# Patient Record
Sex: Female | Born: 1955
Health system: Southern US, Community
[De-identification: ages and names within clinical notes are randomized; demographics above are authoritative.]

## PROBLEM LIST (undated history)

## (undated) DIAGNOSIS — M329 Systemic lupus erythematosus, unspecified: Secondary | ICD-10-CM

## (undated) DIAGNOSIS — D689 Coagulation defect, unspecified: Secondary | ICD-10-CM

## (undated) DIAGNOSIS — K219 Gastro-esophageal reflux disease without esophagitis: Secondary | ICD-10-CM

## (undated) DIAGNOSIS — I82409 Acute embolism and thrombosis of unspecified deep veins of unspecified lower extremity: Secondary | ICD-10-CM

## (undated) DIAGNOSIS — F32A Depression, unspecified: Secondary | ICD-10-CM

## (undated) DIAGNOSIS — IMO0002 Reserved for concepts with insufficient information to code with codable children: Secondary | ICD-10-CM

## (undated) DIAGNOSIS — K859 Acute pancreatitis without necrosis or infection, unspecified: Secondary | ICD-10-CM

## (undated) DIAGNOSIS — E039 Hypothyroidism, unspecified: Secondary | ICD-10-CM

## (undated) DIAGNOSIS — R011 Cardiac murmur, unspecified: Secondary | ICD-10-CM

## (undated) DIAGNOSIS — B009 Herpesviral infection, unspecified: Secondary | ICD-10-CM

## (undated) DIAGNOSIS — F419 Anxiety disorder, unspecified: Secondary | ICD-10-CM

## (undated) DIAGNOSIS — F329 Major depressive disorder, single episode, unspecified: Secondary | ICD-10-CM

## (undated) DIAGNOSIS — E785 Hyperlipidemia, unspecified: Secondary | ICD-10-CM

## (undated) DIAGNOSIS — J189 Pneumonia, unspecified organism: Secondary | ICD-10-CM

## (undated) DIAGNOSIS — I10 Essential (primary) hypertension: Secondary | ICD-10-CM

## (undated) DIAGNOSIS — M797 Fibromyalgia: Secondary | ICD-10-CM

## (undated) DIAGNOSIS — J449 Chronic obstructive pulmonary disease, unspecified: Secondary | ICD-10-CM

## (undated) DIAGNOSIS — K449 Diaphragmatic hernia without obstruction or gangrene: Secondary | ICD-10-CM

## (undated) DIAGNOSIS — G709 Myoneural disorder, unspecified: Secondary | ICD-10-CM

## (undated) HISTORY — DX: Anxiety disorder, unspecified: F41.9

## (undated) HISTORY — DX: Pneumonia, unspecified organism: J18.9

## (undated) HISTORY — PX: SEPTOPLASTY: SUR1290

## (undated) HISTORY — DX: Hyperlipidemia, unspecified: E78.5

## (undated) HISTORY — DX: Herpesviral infection, unspecified: B00.9

## (undated) HISTORY — DX: Systemic lupus erythematosus, unspecified: M32.9

## (undated) HISTORY — DX: Fibromyalgia: M79.7

## (undated) HISTORY — DX: Diaphragmatic hernia without obstruction or gangrene: K44.9

## (undated) HISTORY — PX: COLONOSCOPY: SHX174

## (undated) HISTORY — PX: OTHER SURGICAL HISTORY: SHX169

## (undated) HISTORY — DX: Hypothyroidism, unspecified: E03.9

## (undated) HISTORY — DX: Reserved for concepts with insufficient information to code with codable children: IMO0002

## (undated) HISTORY — PX: ABDOMINAL HYSTERECTOMY: SHX81

## (undated) HISTORY — DX: Myoneural disorder, unspecified: G70.9

## (undated) HISTORY — DX: Major depressive disorder, single episode, unspecified: F32.9

## (undated) HISTORY — DX: Acute embolism and thrombosis of unspecified deep veins of unspecified lower extremity: I82.409

## (undated) HISTORY — DX: Coagulation defect, unspecified: D68.9

## (undated) HISTORY — DX: Essential (primary) hypertension: I10

## (undated) HISTORY — PX: CHOLECYSTECTOMY: SHX55

## (undated) HISTORY — DX: Cardiac murmur, unspecified: R01.1

## (undated) HISTORY — DX: Gastro-esophageal reflux disease without esophagitis: K21.9

## (undated) HISTORY — PX: APPENDECTOMY: SHX54

## (undated) HISTORY — DX: Chronic obstructive pulmonary disease, unspecified: J44.9

## (undated) HISTORY — DX: Depression, unspecified: F32.A

---

## 2005-09-18 LAB — HM MAMMOGRAPHY

## 2007-09-19 LAB — HM COLONOSCOPY

## 2008-05-06 ENCOUNTER — Encounter: Payer: Self-pay | Admitting: Internal Medicine

## 2008-05-26 ENCOUNTER — Encounter: Payer: Self-pay | Admitting: Internal Medicine

## 2008-06-08 ENCOUNTER — Encounter: Payer: Self-pay | Admitting: Internal Medicine

## 2008-06-09 ENCOUNTER — Encounter: Payer: Self-pay | Admitting: Internal Medicine

## 2008-07-09 ENCOUNTER — Ambulatory Visit: Payer: Self-pay | Admitting: Internal Medicine

## 2008-07-17 ENCOUNTER — Ambulatory Visit: Payer: Self-pay | Admitting: Internal Medicine

## 2008-07-18 DIAGNOSIS — E039 Hypothyroidism, unspecified: Secondary | ICD-10-CM | POA: Insufficient documentation

## 2008-07-18 DIAGNOSIS — I10 Essential (primary) hypertension: Secondary | ICD-10-CM

## 2008-07-18 DIAGNOSIS — J45909 Unspecified asthma, uncomplicated: Secondary | ICD-10-CM | POA: Insufficient documentation

## 2008-07-18 DIAGNOSIS — E785 Hyperlipidemia, unspecified: Secondary | ICD-10-CM

## 2008-07-21 DIAGNOSIS — R0602 Shortness of breath: Secondary | ICD-10-CM | POA: Insufficient documentation

## 2008-08-25 ENCOUNTER — Ambulatory Visit: Payer: Self-pay | Admitting: Internal Medicine

## 2008-08-25 ENCOUNTER — Ambulatory Visit (HOSPITAL_BASED_OUTPATIENT_CLINIC_OR_DEPARTMENT_OTHER): Admission: RE | Admit: 2008-08-25 | Discharge: 2008-08-25 | Payer: Self-pay | Admitting: Internal Medicine

## 2008-08-25 DIAGNOSIS — IMO0001 Reserved for inherently not codable concepts without codable children: Secondary | ICD-10-CM

## 2008-08-29 ENCOUNTER — Ambulatory Visit: Payer: Self-pay | Admitting: Internal Medicine

## 2008-10-09 ENCOUNTER — Encounter: Payer: Self-pay | Admitting: Family Medicine

## 2008-10-29 ENCOUNTER — Ambulatory Visit: Payer: Self-pay | Admitting: Internal Medicine

## 2008-11-07 ENCOUNTER — Emergency Department (HOSPITAL_COMMUNITY): Admission: EM | Admit: 2008-11-07 | Discharge: 2008-11-07 | Payer: Self-pay | Admitting: Emergency Medicine

## 2008-11-14 DIAGNOSIS — G4730 Sleep apnea, unspecified: Secondary | ICD-10-CM

## 2008-11-17 ENCOUNTER — Ambulatory Visit: Payer: Self-pay | Admitting: Internal Medicine

## 2008-11-17 ENCOUNTER — Inpatient Hospital Stay (HOSPITAL_COMMUNITY): Admission: EM | Admit: 2008-11-17 | Discharge: 2008-11-19 | Payer: Self-pay | Admitting: Emergency Medicine

## 2009-01-04 ENCOUNTER — Ambulatory Visit: Payer: Self-pay | Admitting: Internal Medicine

## 2009-03-03 ENCOUNTER — Ambulatory Visit: Payer: Self-pay | Admitting: Family Medicine

## 2009-03-03 DIAGNOSIS — E1165 Type 2 diabetes mellitus with hyperglycemia: Secondary | ICD-10-CM

## 2009-03-10 ENCOUNTER — Telehealth: Payer: Self-pay | Admitting: Family Medicine

## 2009-05-03 ENCOUNTER — Telehealth: Payer: Self-pay | Admitting: Family Medicine

## 2009-05-26 ENCOUNTER — Telehealth: Payer: Self-pay | Admitting: Family Medicine

## 2009-05-26 ENCOUNTER — Ambulatory Visit: Payer: Self-pay | Admitting: Family Medicine

## 2009-06-22 ENCOUNTER — Telehealth: Payer: Self-pay | Admitting: Family Medicine

## 2009-07-01 ENCOUNTER — Telehealth: Payer: Self-pay | Admitting: Family Medicine

## 2009-07-28 ENCOUNTER — Telehealth: Payer: Self-pay | Admitting: Family Medicine

## 2009-08-03 ENCOUNTER — Ambulatory Visit: Payer: Self-pay | Admitting: Family Medicine

## 2009-08-03 DIAGNOSIS — L659 Nonscarring hair loss, unspecified: Secondary | ICD-10-CM | POA: Insufficient documentation

## 2009-08-04 LAB — CONVERTED CEMR LAB
Alkaline Phosphatase: 80 units/L (ref 39–117)
BUN: 11 mg/dL (ref 6–23)
Chloride: 101 meq/L (ref 96–112)
Cholesterol: 205 mg/dL — ABNORMAL HIGH (ref 0–200)
Creatinine, Ser: 0.8 mg/dL (ref 0.4–1.2)
Glucose, Bld: 102 mg/dL — ABNORMAL HIGH (ref 70–99)
HDL: 39.1 mg/dL (ref >39.00)
Hgb A1c MFr Bld: 6 % (ref 4.6–6.5)
Potassium: 3.9 meq/L (ref 3.5–5.1)
Sodium: 138 meq/L (ref 135–145)
TSH: 0.07 microintl units/mL — ABNORMAL LOW (ref 0.35–5.50)
Total CHOL/HDL Ratio: 5
Total Protein: 7.3 g/dL (ref 6.0–8.3)

## 2009-08-18 ENCOUNTER — Emergency Department (HOSPITAL_COMMUNITY): Admission: EM | Admit: 2009-08-18 | Discharge: 2009-08-18 | Payer: Self-pay | Admitting: Emergency Medicine

## 2009-08-30 ENCOUNTER — Telehealth: Payer: Self-pay | Admitting: Family Medicine

## 2009-09-01 ENCOUNTER — Ambulatory Visit: Payer: Self-pay | Admitting: Family Medicine

## 2009-09-03 ENCOUNTER — Telehealth: Payer: Self-pay | Admitting: Family Medicine

## 2009-09-15 ENCOUNTER — Ambulatory Visit: Payer: Self-pay | Admitting: Family Medicine

## 2009-09-15 ENCOUNTER — Encounter: Payer: Self-pay | Admitting: Internal Medicine

## 2009-09-23 ENCOUNTER — Encounter (INDEPENDENT_AMBULATORY_CARE_PROVIDER_SITE_OTHER): Payer: Self-pay | Admitting: Internal Medicine

## 2009-09-23 ENCOUNTER — Observation Stay (HOSPITAL_COMMUNITY): Admission: EM | Admit: 2009-09-23 | Discharge: 2009-09-25 | Payer: Self-pay | Admitting: Internal Medicine

## 2009-09-23 ENCOUNTER — Ambulatory Visit: Payer: Self-pay | Admitting: Internal Medicine

## 2009-09-27 ENCOUNTER — Telehealth: Payer: Self-pay | Admitting: Family Medicine

## 2009-09-29 ENCOUNTER — Ambulatory Visit: Payer: Self-pay | Admitting: Family Medicine

## 2009-09-30 ENCOUNTER — Telehealth: Payer: Self-pay | Admitting: Family Medicine

## 2009-10-01 ENCOUNTER — Inpatient Hospital Stay (HOSPITAL_COMMUNITY): Admission: EM | Admit: 2009-10-01 | Discharge: 2009-10-06 | Payer: Self-pay | Admitting: Emergency Medicine

## 2009-10-07 ENCOUNTER — Telehealth (INDEPENDENT_AMBULATORY_CARE_PROVIDER_SITE_OTHER): Payer: Self-pay | Admitting: *Deleted

## 2009-10-12 ENCOUNTER — Telehealth: Payer: Self-pay | Admitting: Internal Medicine

## 2009-10-14 ENCOUNTER — Ambulatory Visit: Payer: Self-pay | Admitting: Family Medicine

## 2009-10-14 DIAGNOSIS — J129 Viral pneumonia, unspecified: Secondary | ICD-10-CM

## 2009-10-15 ENCOUNTER — Ambulatory Visit: Payer: Self-pay | Admitting: Internal Medicine

## 2009-10-18 ENCOUNTER — Ambulatory Visit: Payer: Self-pay | Admitting: Family Medicine

## 2009-11-15 ENCOUNTER — Ambulatory Visit: Payer: Self-pay | Admitting: Family Medicine

## 2009-12-01 ENCOUNTER — Telehealth: Payer: Self-pay | Admitting: Family Medicine

## 2009-12-09 ENCOUNTER — Ambulatory Visit: Payer: Self-pay | Admitting: Family Medicine

## 2009-12-15 ENCOUNTER — Encounter: Payer: Self-pay | Admitting: Family Medicine

## 2009-12-24 ENCOUNTER — Telehealth: Payer: Self-pay | Admitting: Family Medicine

## 2009-12-31 ENCOUNTER — Telehealth: Payer: Self-pay | Admitting: Family Medicine

## 2010-01-03 ENCOUNTER — Ambulatory Visit: Payer: Self-pay | Admitting: Family Medicine

## 2010-01-03 DIAGNOSIS — I82409 Acute embolism and thrombosis of unspecified deep veins of unspecified lower extremity: Secondary | ICD-10-CM | POA: Insufficient documentation

## 2010-01-12 ENCOUNTER — Encounter: Payer: Self-pay | Admitting: Family Medicine

## 2010-01-27 ENCOUNTER — Telehealth: Payer: Self-pay | Admitting: Family Medicine

## 2010-01-27 ENCOUNTER — Telehealth (INDEPENDENT_AMBULATORY_CARE_PROVIDER_SITE_OTHER): Payer: Self-pay | Admitting: *Deleted

## 2010-02-01 ENCOUNTER — Telehealth: Payer: Self-pay | Admitting: Family Medicine

## 2010-02-02 ENCOUNTER — Encounter: Payer: Self-pay | Admitting: Family Medicine

## 2010-02-02 ENCOUNTER — Telehealth: Payer: Self-pay | Admitting: Family Medicine

## 2010-03-24 ENCOUNTER — Ambulatory Visit: Payer: Self-pay | Admitting: Family Medicine

## 2010-03-24 DIAGNOSIS — M545 Low back pain: Secondary | ICD-10-CM

## 2010-03-24 LAB — CONVERTED CEMR LAB
Blood in Urine, dipstick: NEGATIVE
Glucose, Urine, Semiquant: NEGATIVE
Nitrite: NEGATIVE
Specific Gravity, Urine: 1.02
Urobilinogen, UA: 0.2
WBC Urine, dipstick: NEGATIVE
pH: 5

## 2010-04-08 ENCOUNTER — Telehealth: Payer: Self-pay | Admitting: Family Medicine

## 2010-05-09 ENCOUNTER — Ambulatory Visit: Payer: Self-pay | Admitting: Family Medicine

## 2010-05-09 DIAGNOSIS — M25579 Pain in unspecified ankle and joints of unspecified foot: Secondary | ICD-10-CM

## 2010-05-09 DIAGNOSIS — IMO0002 Reserved for concepts with insufficient information to code with codable children: Secondary | ICD-10-CM

## 2010-05-10 ENCOUNTER — Telehealth: Payer: Self-pay | Admitting: Family Medicine

## 2010-05-24 ENCOUNTER — Ambulatory Visit: Payer: Self-pay | Admitting: Family Medicine

## 2010-05-24 ENCOUNTER — Telehealth: Payer: Self-pay | Admitting: Family Medicine

## 2010-05-24 DIAGNOSIS — R609 Edema, unspecified: Secondary | ICD-10-CM

## 2010-05-25 LAB — CONVERTED CEMR LAB
BUN: 19 mg/dL (ref 6–23)
CO2: 31 meq/L (ref 19–32)
Creatinine, Ser: 0.8 mg/dL (ref 0.4–1.2)
Potassium: 4.1 meq/L (ref 3.5–5.1)

## 2010-05-31 ENCOUNTER — Telehealth: Payer: Self-pay | Admitting: Family Medicine

## 2010-07-23 ENCOUNTER — Encounter: Payer: Self-pay | Admitting: Family Medicine

## 2010-07-28 ENCOUNTER — Telehealth: Payer: Self-pay | Admitting: Family Medicine

## 2010-07-28 ENCOUNTER — Ambulatory Visit: Payer: Self-pay | Admitting: Family Medicine

## 2010-07-28 DIAGNOSIS — R509 Fever, unspecified: Secondary | ICD-10-CM

## 2010-09-02 ENCOUNTER — Telehealth: Payer: Self-pay | Admitting: Family Medicine

## 2010-09-24 ENCOUNTER — Emergency Department (HOSPITAL_COMMUNITY)
Admission: EM | Admit: 2010-09-24 | Discharge: 2010-09-24 | Payer: Self-pay | Source: Home / Self Care | Admitting: Emergency Medicine

## 2010-09-26 ENCOUNTER — Telehealth: Payer: Self-pay | Admitting: Family Medicine

## 2010-09-28 ENCOUNTER — Other Ambulatory Visit: Payer: Self-pay | Admitting: Family Medicine

## 2010-09-28 ENCOUNTER — Ambulatory Visit
Admission: RE | Admit: 2010-09-28 | Discharge: 2010-09-28 | Payer: Self-pay | Source: Home / Self Care | Attending: Family Medicine | Admitting: Family Medicine

## 2010-09-28 DIAGNOSIS — M25529 Pain in unspecified elbow: Secondary | ICD-10-CM | POA: Insufficient documentation

## 2010-09-28 LAB — HM DIABETES EYE EXAM: HM Diabetic Eye Exam: NORMAL

## 2010-09-28 LAB — TSH: TSH: 0.16 u[IU]/mL — ABNORMAL LOW (ref 0.35–5.50)

## 2010-10-03 LAB — GLUCOSE, CAPILLARY: Glucose-Capillary: 203 mg/dL — ABNORMAL HIGH (ref 70–99)

## 2010-10-09 ENCOUNTER — Encounter: Payer: Self-pay | Admitting: Cardiology

## 2010-10-09 ENCOUNTER — Encounter: Payer: Self-pay | Admitting: Internal Medicine
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
## 2010-10-18 NOTE — Progress Notes (Signed)
Summary: Call-A-Nurse Report  [facility]-Nurse Triage Call Report Triage Record Num: [id number] Operator: Alphonsa Overall Patient Name: Yesenia Wilson Call Date & Time: 05/21/2010 7:43:13PM Patient Phone: [phone] PCP: Evelena Peat Patient Gender: Female PCP Fax : [phone] Patient DOB: 08-Jul-1956 [facility] Name: [facility] Reason for Call: Kathie Rhodes calling about ankle swelling. Onset 05/19/10. Pain and swelling in ankles,hands. OV 2 weeks ago. New job for pt x 3 weeks.Swelling resolves at night and worsens throughout day. Pt hears popping sounds in ankles occasionally, feels ankles may not 'hold her up". Home care advice given. Matalyn will call office 05/24/10 for assessment. Protocol(s) Used: Ankle Non-Injury Recommended Outcome per Protocol: See Provider within 2 Weeks Reason for Outcome: Joint stiff, pops, grates, catches, snaps or occasionally gives out with use AND not previously evaluated Care Advice:  ~ Call provider if symptoms worsen or new symptoms develop. Exercise to maintain good joint motion. Exercise when joints are less stiff. Avoid fatigue; alternate work and rest periods.  ~  ~ Limit squatting, kneeling, repetitious bending and high impact activity.  ~ HEALTH PROMOTION / MAINTENANCE  ~ SYMPTOM / CONDITION MANAGEMENT  ~ CAUTIONS Warm up before you exercise. Gradually increase the intensity and length of time to exercise. Consider using an ankle brace when exercising if ankle tends to give out. Make sure your shoes fit correctly. Avoid high-heeled shoes.  ~  ~ Go to the ED IMMEDIATELY if joint locks and won't move. Analgesic/Antipyretic Advice - Acetaminophen: Consider acetaminophen as directed on label or by pharmacist/provider for pain or fever PRECAUTIONS: - Use if there is no history of liver disease, alcoholism, or intake of three or more alcohol drinks per day - Only if approved by provider during pregnancy or when breastfeeding - During pregnancy,  acetaminophen should not be taken more than 3 consecutive days without telling provider - Do not exceed recommended dose or frequency  ~ Analgesic/Antipyretic Advice - NSAIDs: Consider aspirin, ibuprofen, naproxen or ketoprofen for pain or fever as directed on label or by pharmacist/provider. PRECAUTIONS: - If over 46 years of age, should not take longer than 1 week without consulting provider. EXCEPTIONS: - Should not be used if taking blood thinners or have bleeding problems. - Do not use if have history of sensitivity/allergy to any of these medications; or history of cardiovascular, ulcer, kidney, liver disease or diabetes unless approved by provider. - Do not exceed recommended dose or frequency.  ~ 05/21/2010 8:02:43PM Page 1 of 1 [email or web address]
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
## 2010-10-18 NOTE — Progress Notes (Signed)
Summary: Pt req script for Fluconazole for yeast inf  Phone Note Call from Patient Call back at Home Phone [phone]   Caller: Patient Summary of Call: Pt req script for Fluconazole for yeast inf. Pls call in to [facility] in [place] [phone].      Initial call taken by: Lucy Antigua,  May 31, 2010 10:51 AM  Follow-up for Phone Call        ok to call in Fluconazole 150 mg one by mouth times one dose. Follow-up by: Evelena Peat MD,  May 31, 2010 12:41 PM    New/Updated Medications: FLUCONAZOLE 150 MG TABS (FLUCONAZOLE) one by mouth now Prescriptions: FLUCONAZOLE 150 MG TABS (FLUCONAZOLE) one by mouth now  #1 x 0   Entered by:   Lynann Beaver CMA   Authorized by:   Gordy Savers  MD   Signed by:   Lynann Beaver CMA on 05/31/2010   Method used:   Electronically to        [facility]  [address] (retail)       [address]       [place], [place]  [address]       Ph: [phone]       Fax: [phone]   RxID:   [id number]
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
## 2010-10-18 NOTE — Progress Notes (Signed)
Summary: refill Metformin X 1 month, pt needs OV  Phone Note Call from Patient Call back at Work Phone [phone]   Caller: Patient---live call Reason for Call: Refill Medication Summary of Call: Pt has death in family and needs her metformin refilled. Was told that she will need to be seen. She will make ov at a later time. her pharmacy is [facility] in [place]----[phone] Initial call taken by: Warnell Forester,  Feb 01, 2010 1:02 PM    Prescriptions: METFORMIN HCL 1000 MG TABS (METFORMIN HCL) 1 tablet two times a day  #60 x 0   Entered by:   Sid Falcon LPN   Authorized by:   Evelena Peat MD   Signed by:   Sid Falcon LPN on 09/81/1914   Method used:   Electronically to        [facility]  [address] (retail)       [address]       [place], [place]  [address]       Ph: [phone]       Fax: [phone]   RxID:   [id number]

## 2010-10-18 NOTE — Assessment & Plan Note (Signed)
Summary: ?pneumonia/njr   Vital Signs:  Patient profile:   55 year old female Weight:      182 pounds O2 Sat:      97 % on Room air Temp:     98.5 degrees F oral Pulse rate:   124 / minute Pulse rhythm:   regular BP sitting:   128 / 84  (left arm) Cuff size:   regular  Vitals Entered By: Sid Falcon LPN (September 29, 2009 10:59 AM)  O2 Flow:  Room air CC: fever, chest hurting, headache   History of Present Illness: Patient here for hospital followup and also with chief complaint of onset of illness about 3 days ago.  Onset Monday of cough and wheezing. Onset yesterday possible low-grade fever. She's had intermittent headaches and night sweats. Cough productive of green to brown sputum. She recalls Pneumovax earlier this year. Has some nasal congestive symptoms. Home nebulizer which she is using fairly regularly.  Recent admission with chest pain.   All hospital records reviewed. This was felt to be noncardiac. Ruled out for MI. Stress Myoview negative. 2-D echocardiogram normal LV function and no abnormalities. Patient has been under tremendous stress recently with separation from husband and some issues regarding settling her estate. She thinks in retrospect that her chest pain may have been related to stress issues.  Blood pressure was actually somewhat low during hospitalization requiring saline bolus. Blood pressure dipped down below 90 systolic on a couple of occasions. Her enalapril, metoprolol, and Avalide were held. Blood pressures have remained steady mostly 120 systolic or lower home readings. Patient had recently had poor intake but is drinking and eating somewhat better now.  Allergies: 1)  Codeine  Past History:  Past Medical History: Last updated: 03/03/2009 Childhood burn back and scalp- hosp Hypertension Cardiac arryhthmia cardiac cath neg for chest pain pneumonia Lupus fibromyalgia Asthma- Skin test Poswitive 10/29/08 Diabetes Hyperlipidemia Sleep Apnea-  UPPP, had failed cpap- resolved on f/u NPSG DVT left calf after splint for ankle injury depression Hypothyroidism Nerve deafness right reported seizure 1995  Past Surgical History: Last updated: 07/09/2008 Appendectomy Cholecystectomy T A H and B S O UPPPP for sleep apnea septoplasty Bladder tack C-section  Review of Systems       The patient complains of fever.  The patient denies weight loss, hoarseness, chest pain, syncope, dyspnea on exertion, peripheral edema, headaches, hemoptysis, and abdominal pain.    Physical Exam  General:  Well-developed,well-nourished,in no acute distress; alert,appropriate and cooperative throughout examination Eyes:  pupils equal, pupils round, and pupils reactive to light.   Ears:  External ear exam shows no significant lesions or deformities.  Otoscopic examination reveals clear canals, tympanic membranes are intact bilaterally without bulging, retraction, inflammation or discharge. Hearing is grossly normal bilaterally. Nose:  External nasal examination shows no deformity or inflammation. Nasal mucosa are pink and moist without lesions or exudates. Mouth:  Oral mucosa and oropharynx without lesions or exudates.  Teeth in good repair. Neck:  No deformities, masses, or tenderness noted. Lungs:  patient some diffuse wheezes. Symmetric breath sounds. No retractions. No rales. Heart:  regular rhythm with rate around 100.no murmur and no gallop.   Extremities:  no peripheral edema Skin:  no skin rash Cervical Nodes:  No lymphadenopathy noted Psych:  Oriented X3, normally interactive, good eye contact, and not depressed appearing.     Impression & Recommendations:  Problem # 1:  CHEST PAIN, ATYPICAL (ICD-786.59) Assessment Improved recent cardiac workup negative. Patient denies chest pain  at this time.  Suspect this might have been related to recent stress issues.  Problem # 2:  ASTHMA (ICD-493.90) exacerbation probably related to viral URI. She  does have productive cough. Depo-Medrol be given and doxycycline for one week.  Problem # 3:  HYPERTENSION (ICD-401.9) currently stable off medication. Continue to hold blood pressure medicines at this time. Reassess in 3-4 weeks. The following medications were removed from the medication list:    Enalapril Maleate 10 Mg Tabs (Enalapril maleate) .Marland Kitchen... Take 1 tablet by mouth once a day    Metoprolol Tartrate 25 Mg Tabs (Metoprolol tartrate) .Marland Kitchen... Take 1 tablet by mouth once a day    Avalide 150-12.5 Mg Tabs (Irbesartan-hydrochlorothiazide) .Marland Kitchen... Take 1 tablet by mouth once a day  Complete Medication List: 1)  Nexium 40 Mg Pack (Esomeprazole magnesium) .... Take 1 capsule by mouth once a day 2)  Estrace 2 Mg Tabs (Estradiol) .... Once daily 3)  Alprazolam 0.5 Mg Tabs (Alprazolam) .... Take 1 tablet by mouth four times a day as needed for anxiety 4)  Cymbalta 60 Mg Cpep (Duloxetine hcl) .... Take 1 capsule by mouth at bedtime 5)  Tramadol Hcl 50 Mg Tabs (Tramadol hcl) .... Take 1 tablet by mouth once a day as needed for pain 6)  Metformin Hcl 1000 Mg Tabs (Metformin hcl) .Marland Kitchen.. 1 tablet two times a day 7)  Lovastatin 20 Mg Tabs (Lovastatin) .... Take 1 tablet by mouth once a day 8)  Trazodone Hcl 50 Mg Tabs (Trazodone hcl) .... 1/2 tab at bedtime as needed 9)  Bayer Aspirin 325 Mg Tabs (Aspirin) .... Take 1 tablet by mouth once a day 10)  Fluconazole 150 Mg Tabs (Fluconazole) .... One tablet by mouth times one dose 11)  Synthroid 137 Mcg Tabs (Levothyroxine sodium) .... Once daily 12)  Doxycycline Hyclate 100 Mg Caps (Doxycycline hyclate) .... One by mouth two times a day for 7 days  Other Orders: Depo- Medrol 80mg  (J1040) Admin of Therapeutic Inj  intramuscular or subcutaneous (06237)  Patient Instructions: 1)  drink lots of fluids. 2)  Check your  Blood Pressure regularly . If it is above:150/90   you should make an appointment. 3)  Please schedule a follow-up appointment in 1 month.    Prescriptions: FLUCONAZOLE 150 MG TABS (FLUCONAZOLE) one tablet by mouth times one dose  #1 x 0   Entered and Authorized by:   Evelena Peat MD   Signed by:   Evelena Peat MD on 09/29/2009   Method used:   Print then Give to Patient   RxID:   [id number] DOXYCYCLINE HYCLATE 100 MG CAPS (DOXYCYCLINE HYCLATE) one by mouth two times a day for 7 days  #14 x 0   Entered and Authorized by:   Evelena Peat MD   Signed by:   Evelena Peat MD on 09/29/2009   Method used:   Print then Give to Patient   RxID:   [id number]    Medication Administration  Injection # 1:    Medication: Depo- Medrol 80mg     Diagnosis: DYSPNEA (ICD-786.05)    Route: IM    Site: LUOQ gluteus    Exp Date: 06/18/2010    Lot #: [id number]    Mfr: Teva    Patient tolerated injection without complications    Given by: Sid Falcon LPN (September 29, 2009 11:59 AM)  Orders Added: 1)  Est. Patient Level IV [93716] 2)  Depo- Medrol 80mg  [J1040] 3)  Admin of Therapeutic Inj  intramuscular or  subcutaneous [id number]

## 2010-10-18 NOTE — Medication Information (Signed)
Summary: Order for Diabetic Testing Supplies  Order for Diabetic Testing Supplies   Imported By: Maryln Gottron 12/20/2009 14:03:06  _____________________________________________________________________  External Attachment:    Type:   Image     Comment:   External Document

## 2010-10-18 NOTE — Progress Notes (Signed)
Summary: appt  Phone Note Call from Patient Call back at Home Phone [phone] Call back at Work Phone [phone]   Caller: Patient Call For: young Summary of Call: pt just out of hospital.  Schedulled her for 02/23 but she thinks CDY will want to see her sooner.  Can you get her in sooner? Initial call taken by: Eugene Gavia,  October 07, 2009 1:50 PM  Follow-up for Phone Call        Dr. Maple Hudson can we use and RN slot for this hospital follow-up. Please advise what day would be ok to use. Thanks. Carron Curie CMA  October 07, 2009 2:55 PM   Additional Follow-up for Phone Call Additional follow up Details #1::        Per CY, offer 10/12/09 at 4:15.  Gweneth Dimitri RN  October 07, 2009 4:51 PM  appt made in [facility].  pt okay with this date/time.  previous appt cancelled. Additional Follow-up by: Boone Master CNA,  October 07, 2009 5:08 PM

## 2010-10-18 NOTE — Progress Notes (Signed)
  Phone Note Call from Patient   Caller: Patient Summary of Call: Pt got home and found a tick under her arm with part of the head still embedded. Initial call taken by: [facility] CMA AAMA,  July 28, 2010 12:50 PM  Follow-up for Phone Call        pt here to be seen. Follow-up by: Evelena Peat MD,  July 28, 2010 12:51 PM

## 2010-10-18 NOTE — Assessment & Plan Note (Signed)
Summary: swollen hands and ankles--wheezing//ccm   Vital Signs:  Patient profile:   55 year old female Weight:      195 pounds Temp:     98.6 degrees F oral BP sitting:   126 / 82  (left arm) Cuff size:   large  Vitals Entered By: Sid Falcon LPN (May 24, 2010 10:00 AM) CC: swollen ankles, hands, some wheezing   History of Present Illness: Here with increased edema hands and feet/legs for past several weeks since starting  new job.   edema is worse late in day.  Wearing venous compression stockings but these belonged to  another person in her family and she felt they were too tight. No increase in sodium intake.  Continues to have signif ankle and leg pains with weight bearing.  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  Type 2 diabetes.  Not monitoring glucose regularly.  Needs follow up labs.  Hypothyroidism.  TSH normal 11/10.  Diabetes Management History:      She has not been enrolled in the "Diabetic Education Program".  She states understanding of dietary principles and is following her diet appropriately.  No sensory loss is reported.  Self foot exams are being performed.  She is checking home blood sugars.  She says that she is not exercising regularly.        Hypoglycemic symptoms are not occurring.  No hyperglycemic symptoms are reported.        No changes have been made to her treatment plan since last visit.    Preventive Screening-Counseling & Management  Caffeine-Diet-Exercise     Does Patient Exercise: no  Allergies: 1)  Codeine 2)  Darvocet-N 100  Past History:  Past Medical History: Last updated: 03/03/2009 Childhood burn back and scalp- hosp Hypertension Cardiac arryhthmia cardiac cath neg for chest pain pneumonia Lupus fibromyalgia Asthma- Skin test Poswitive 10/29/08 Diabetes Hyperlipidemia Sleep Apnea- UPPP, had failed cpap- resolved on f/u NPSG DVT left calf after splint for ankle  injury depression Hypothyroidism Nerve deafness right reported seizure 1995  Past Surgical History: Last updated: 07/09/2008 Appendectomy Cholecystectomy T A H and B S O UPPPP for sleep apnea septoplasty Bladder tack C-section  Family History: Last updated: 10/15/2009 Adopted brother - asthma dtr- asthma Fathers family- heart disease. 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  The patient denies weight loss, chest pain, syncope, dyspnea on exertion, prolonged cough, headaches, abdominal pain, melena, hematochezia, severe indigestion/heartburn, muscle weakness, and enlarged lymph nodes.    Physical Exam  General:  Well-developed,well-nourished,in no acute distress; alert,appropriate and cooperative throughout examination Neck:  No deformities, masses, or tenderness noted. Lungs:  few faint expiratory wheezes Heart:  Normal rate and regular rhythm. S1 and S2 normal without gallop, murmur, click, rub or other extra sounds. Extremities:  no pitting edema. Neurologic:  alert & oriented X3, cranial nerves II-XII intact, and strength normal in all extremities.   Psych:  normally interactive, good eye contact, not anxious appearing, and not depressed appearing.    Diabetes Management Exam:  Foot Exam (with socks and/or shoes not present):       Sensory-Pinprick/Light touch:          Left medial foot (L-4): normal          Left dorsal foot (L-5): normal          Left lateral foot (S-1): normal          Right medial foot (L-4):  normal          Right dorsal foot (L-5): normal          Right lateral foot (S-1): normal       Sensory-Monofilament:          Left foot: normal          Right foot: normal       Inspection:          Left foot: normal          Right foot: normal       Nails:          Left foot: normal          Right foot: normal   Impression & Recommendations:  Problem # 1:  EDEMA (ICD-782.3) suspect venous stasis.  check labs and knee high venous compression garments prescribed. Her updated medication list for this problem includes:    Avalide 150-12.5 Mg Tabs (Irbesartan-hydrochlorothiazide) ..... One daily  Orders: Specimen Handling ([address]) Venipuncture (60454) TLB-BMP (Basic Metabolic Panel-BMET) (80048-METABOL)  Problem # 2:  DM (ICD-250.00) repeat A1c. Her updated medication list for this problem includes:    Metformin Hcl 1000 Mg Tabs (Metformin hcl) .Marland Kitchen... 1 tablet two times a day    Avalide 150-12.5 Mg Tabs (Irbesartan-hydrochlorothiazide) ..... One daily    Aspirin 81 Mg Tabs (Aspirin) .Marland KitchenMarland KitchenMarland KitchenMarland Kitchen 4 once daily  Orders: Specimen Handling (09811) Venipuncture (91478) TLB-A1C / Hgb A1C (Glycohemoglobin) (83036-A1C)  Problem # 3:  HYPOTHYROIDISM (ICD-244.9)  Her updated medication list for this problem includes:    Synthroid 137 Mcg Tabs (Levothyroxine sodium) ..... Once daily  Orders: Specimen Handling (29562) Venipuncture (13086) TLB-TSH (Thyroid Stimulating Hormone) (84443-TSH)  Complete Medication List: 1)  Nexium 40 Mg Pack (Esomeprazole magnesium) .... Take 1 capsule by mouth once a day 2)  Estrace 2 Mg Tabs (Estradiol) .... Once daily 3)  Alprazolam 0.5 Mg Tabs (Alprazolam) .... Take 1 tablet by mouth four times a day as needed for anxiety 4)  Cymbalta 60 Mg Cpep (Duloxetine hcl) .... Take 1 capsule by mouth at bedtime 5)  Tramadol Hcl 50 Mg Tabs (Tramadol hcl) .Marland Kitchen.. 1-2 tablets by mouth q 6 hours as needed pain. 6)  Metformin Hcl 1000 Mg Tabs (Metformin hcl) .Marland Kitchen.. 1 tablet  two times a day 7)  Lovastatin 20 Mg Tabs (Lovastatin) .... Take 2 tablets by mouth once a day 8)  Trazodone Hcl 50 Mg Tabs (Trazodone hcl) .... 1/2 tab at bedtime as needed 9)  Synthroid 137 Mcg Tabs (Levothyroxine sodium) .... Once daily 10)  Avalide 150-12.5 Mg Tabs (Irbesartan-hydrochlorothiazide) .... One daily 11)  Metoprolol Tartrate 50 Mg Tabs (Metoprolol tartrate) .Marland Kitchen.. 1 once daily 12)  Aspirin 81 Mg Tabs (Aspirin) .... 4 once daily 13)  Prednisone 20 Mg Tabs (Prednisone) .... As directed 14)  Symbicort 160-4.5 Mcg/act Aero (Budesonide-formoterol fumarate) .... 2 puffs and rinse two times a day 15)  Xopenex Hfa 45 Mcg/act Aero (Levalbuterol tartrate) .... 2 puffs four times a day as needed rescue 16)  Zithromax Z-pak 250 Mg Tabs (Azithromycin) .... Take as directed  Diabetes Management Assessment/Plan:      Her blood pressure goal is < 130/80.    Patient Instructions: 1)  Limit your Sodium(salt) .  2)  Use venous copression garments daily. 3)  Please schedule a follow-up appointment in 3 months .  4)  Check your blood sugars regularly. If your readings are usually above:  or below 70 you should contact our office.  5)  It is important that your diabetic A1c level is checked every 3 months.  6)  See your eye doctor yearly to check for diabetic eye damage. 7)  Check your feet each night  for sore areas, calluses or signs of infection.

## 2010-10-18 NOTE — Medication Information (Signed)
Summary: Symbicort/[facility]  Symbicort/[facility]   Imported By: Sherian Rein 11/10/2009 07:24:47  _____________________________________________________________________  External Attachment:    Type:   Image     Comment:   External Document

## 2010-10-18 NOTE — Assessment & Plan Note (Signed)
Summary: infected lesions/dm   Vital Signs:  Patient profile:   55 year old female Temp:     97.9 degrees F oral BP sitting:   130 / 80  (left arm) Cuff size:   large  Vitals Entered By: Sid Falcon LPN (May 09, 2010 3:54 PM) CC: bil feet pain, started a new PT job Is Patient Diabetic? Yes   History of Present Illness: Patient seen with the following issues  She initially complained of having an" infected lesions" on her heels. She apparently has couple small blisters. She has started a new job which requires being on hard floors and on her feet much of the day. Since starting this job she's had bilateral ankle pains and ankle edema. Remote history of ankle injury.   No recent injury. Tylenol without improvement.  Edema worse late in day after ambulation.  Also noticing some increased edema in both hands. She tries to limit salt but has been eating some snacks foods recently with increased sodium.  She relates she's been compliant with all regular medications. Orthostatic symptoms from last visit have resolved.   type 2 DM has been well controlled.  Allergies: 1)  Codeine 2)  Darvocet-N 100  Past History:  Past Medical History: Last updated: 03/03/2009 Childhood burn back and scalp- hosp Hypertension Cardiac arryhthmia cardiac cath neg for chest pain pneumonia Lupus fibromyalgia Asthma- Skin test Poswitive 10/29/08 Diabetes Hyperlipidemia Sleep Apnea- UPPP, had failed cpap- resolved on f/u NPSG DVT left calf after splint for ankle injury depression Hypothyroidism Nerve deafness right reported seizure 1995  Past Surgical History: Last updated: 07/09/2008 Appendectomy Cholecystectomy T A H and B S O UPPPP for sleep apnea septoplasty Bladder tack C-section  Review of Systems       The patient complains of peripheral edema.  The patient denies anorexia, fever, weight loss, weight gain, chest pain, syncope, dyspnea on exertion, headaches, abdominal pain,  and muscle weakness.    Physical Exam  General:  Well-developed,well-nourished,in no acute distress; alert,appropriate and cooperative throughout examination Mouth:  Oral mucosa and oropharynx without lesions or exudates.  Teeth in good repair. Neck:  No deformities, masses, or tenderness noted. Lungs:  Normal respiratory effort, chest expands symmetrically. Lungs are clear to auscultation, no crackles or wheezes. Heart:  Normal rate and regular rhythm. S1 and S2 normal without gallop, murmur, click, rub or other extra sounds. Extremities:  patient has some mild edema left lateral ankle and some diffuse tenderness but no ecchymosis. Good range of motion. No areas of specific bony tenderness. Achilles is fully intact.  Normal ankle stability. Neurologic:  alert & oriented X3, cranial nerves II-XII intact, and strength normal in all extremities.   Skin:  patient has superficial blister posterior right heel. No signs of secondary infection.  no cellulitis changes.   Impression & Recommendations:  Problem # 1:  BLISTER, FOOT (ICD-917.2) Assessment New discussed appropriate wound hygiene.  No signs of infection at this time.  Problem # 2:  ANKLE PAIN (ICD-719.47) Assessment: New  some ?mild inflammatory changes probably related to old injury and aggravated by recent activity. ACE wrap for coompression and icing.  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  Tramadol prescribed.    Orders: Ace Wraps 3-5 in/yard  (Z6109) Prescription Created Electronically [phone])  Problem # 3:  DM (ICD-250.00)  Her updated medication list for this problem includes:    Metformin Hcl 1000 Mg Tabs (Metformin hcl) .Marland KitchenMarland KitchenMarland KitchenMarland Kitchen  1 tablet two times a day    Avalide 150-12.5 Mg Tabs (Irbesartan-hydrochlorothiazide) ..... One daily    Aspirin 81 Mg Tabs (Aspirin) .Marland KitchenMarland KitchenMarland KitchenMarland Kitchen 4 once daily  Problem # 4:  LOW BLOOD PRESSURE (ICD-458.9) Assessment: Improved  Complete Medication  List: 1)  Nexium 40 Mg Pack (Esomeprazole magnesium) .... Take 1 capsule by mouth once a day 2)  Estrace 2 Mg Tabs (Estradiol) .... Once daily 3)  Alprazolam 0.5 Mg Tabs (Alprazolam) .... Take 1 tablet by mouth four times a day as needed for anxiety 4)  Cymbalta 60 Mg Cpep (Duloxetine hcl) .... Take 1 capsule by mouth at bedtime 5)  Tramadol Hcl 50 Mg Tabs (Tramadol hcl) .Marland Kitchen.. 1-2 tablets by mouth q 6 hours as needed pain. 6)  Metformin Hcl 1000 Mg Tabs (Metformin hcl) .Marland Kitchen.. 1 tablet two times a day 7)  Lovastatin 20 Mg Tabs (Lovastatin) .... Take 2 tablets by mouth once a day 8)  Trazodone Hcl 50 Mg Tabs (Trazodone hcl) .... 1/2 tab at bedtime as needed 9)  Synthroid 137 Mcg Tabs (Levothyroxine sodium) .... Once daily 10)  Avalide 150-12.5 Mg Tabs (Irbesartan-hydrochlorothiazide) .... One daily 11)  Metoprolol Tartrate 50 Mg Tabs (Metoprolol tartrate) .Marland Kitchen.. 1 once daily 12)  Aspirin 81 Mg Tabs (Aspirin) .... 4 once daily 13)  Prednisone 20 Mg Tabs (Prednisone) .... As directed 14)  Symbicort 160-4.5 Mcg/act Aero (Budesonide-formoterol fumarate) .... 2 puffs and rinse two times a day 15)  Xopenex Hfa 45 Mcg/act Aero (Levalbuterol tartrate) .... 2 puffs four times a day as needed rescue 16)  Zithromax Z-pak 250 Mg Tabs (Azithromycin) .... Take as directed  Patient Instructions: 1)  Please schedule a follow-up appointment in 1 month.  2)  Keep blister clean with soap and water and folow up if you note any increased redness or drainage. 3)  Limit your Sodium(salt) .  4)  Elevated feet/legs frequently. Prescriptions: TRAMADOL HCL 50 MG TABS (TRAMADOL HCL) 1-2 tablets by mouth q 6 hours as needed pain.  #120 x 1   Entered and Authorized by:   Evelena Peat MD   Signed by:   Evelena Peat MD on 05/09/2010   Method used:   Electronically to        [facility]  [address] (retail)       [address]       [place], [place]  [address]       Ph: [phone]       Fax:  [phone]   RxID:   [id number]
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
## 2010-10-18 NOTE — Progress Notes (Signed)
Summary: OUT OF WORK  Phone Note Call from Patient   Caller: Patient Call For: Evelena Peat MD Summary of Call: Pt needs to know when she can go back to work, and needs a note written.  She did not go in yesterday or today. [phone] Initial call taken by: Lynann Beaver CMA,  May 10, 2010 9:44 AM  Follow-up for Phone Call        Pt notified me yesterday when we discussed that she did not EVER plan to go back to that job secondary to the physical demands.  I explained to her that  if she is definitely quitting it is inappropriate to string this out with medical leave, etc.  Has she changed her mind about work? Follow-up by: Evelena Peat MD,  May 10, 2010 12:56 PM  Additional Follow-up for Phone Call Additional follow up Details #1::        She is planning to continue work.  Dr Caryl Never has okayed yesterday and today only. Additional Follow-up by: Lynann Beaver CMA,  May 10, 2010 1:17 PM    Additional Follow-up for Phone Call Additional follow up Details #2::    Pt notified, and letter written. Follow-up by: Lynann Beaver CMA,  May 10, 2010 1:24 PM

## 2010-10-18 NOTE — Medication Information (Signed)
Summary: Order for Diabetes Testing Supplies  Order for Diabetes Testing Supplies   Imported By: Maryln Gottron 01/14/2010 11:30:42  _____________________________________________________________________  External Attachment:    Type:   Image     Comment:   External Document

## 2010-10-18 NOTE — Progress Notes (Signed)
Summary: Starting on Coumadin, needs OV on Mon 4/25  Phone Note From Other Clinic   Caller: Dr Ihor Gully Summary of Call: pt has remote hx DVT.  Recent ankle injury and orthopedist plans to place her in cast for approx 6 weeks.  Inquiring whether we can follow her for anticoagulation on Coumadin for appox 6 weeks.  Stated we could follow.  He plans to start her on Coumadin 5 mg once daily and we need office f/u next Monday and check INR then. Initial call taken by: Evelena Peat MD,  December 31, 2009 1:29 PM  Follow-up for Phone Call        Pt was scheduled for f/u appt on Monday, 01/03/2010 @ 1:45pm.... However, pt advises that she has not gone to get the Rx for coumadin filled yet and she is concerned about taking it...Marland KitchenMarland KitchenWhen nurse checked the pts med list at Dr Aletta Edouard office, pt was advised that there may be a contraindication with some of the meds that she is currently taking.......?  Pt also wants to check to see if there is a place near [place] that she can take Physical Therapy so she won't have to travel to [place].  Pt req a return call at [phone].  Follow-up by: Debbra Riding,  December 31, 2009 2:04 PM  Additional Follow-up for Phone Call Additional follow up Details #1::        There is PT available in [place] [facility]).  Have patient keep appt for Monday and we can discuss Coumadin issue in more detail. Additional Follow-up by: Evelena Peat MD,  December 31, 2009 3:12 PM    Additional Follow-up for Phone Call Additional follow up Details #2::    Pt was contacted and adv of Dr Mar Daring instructions...Marland KitchenMarland Kitchen Pt acknowledged same.  Follow-up by: Debbra Riding,  December 31, 2009 3:42 PM

## 2010-10-18 NOTE — Letter (Signed)
Summary: [facility]   Imported By: Sherian Rein 08/03/2010 08:59:18  _____________________________________________________________________  External Attachment:    Type:   Image     Comment:   External Document

## 2010-10-18 NOTE — Progress Notes (Signed)
Summary: Ongoing L ankle pain, plans too go to Ortho in [place]  Phone Note Call from Patient   Caller: Patient Call For: Evelena Peat MD Summary of Call: Pt called to report she is still having alot of trouble & pain with her left ankle.  Pt has seen Ortho in [facility] for earlier ankle surgeries and plans to call them for evaluation of her current symptoms following fall.   FYI Initial call taken by: Sid Falcon LPN,  December 01, 2009 2:50 PM  Follow-up for Phone Call        noted. Follow-up by: Evelena Peat MD,  December 01, 2009 4:48 PM
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
## 2010-10-18 NOTE — Progress Notes (Signed)
Summary: refill  Phone Note Call from Patient Call back at Home Phone [phone]   ref  Caller: Patient---live cal Summary of Call: refill Enalapril to [facility] in [place]. Initial call taken by: Warnell Forester,  April 08, 2010 1:50 PM  Follow-up for Phone Call        Called pt back, informed her Dr Caryl Never had take her offo this med on 09/29/09 OV.  Pt voiced her understanding.  Cancel request Follow-up by: Sid Falcon LPN,  April 08, 2010 2:57 PM

## 2010-10-18 NOTE — Progress Notes (Signed)
Summary: antibiotic?  Phone Note Call from Patient   Caller: Patient Call For: Evelena Peat MD Summary of Call: Pt is having chest congestion and feels ill.  She  has been caring for a family member that has had pneumonia, high fever, and passed away this am.  Advised her to come in the office today.  She will call back when she knows what time she could come.  Is asking for an antibiotic without being seen??? Initial call taken by: Lynann Beaver CMA,  Jan 27, 2010 8:43 AM  Follow-up for Phone Call        Pt needs to be seen, esp with her hx of chronic resp problems. Follow-up by: Evelena Peat MD,  Jan 27, 2010 9:13 AM
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
## 2010-10-18 NOTE — Progress Notes (Signed)
Summary: CONGESTED  Phone Note Call from Patient Call back at [phone]   Caller: Patient Call For: YOUNG Summary of Call: CONGESTED NEED SOMETHING CALLED TO PHARMACY  Initial call taken by: Rickard Patience,  Jan 27, 2010 2:45 PM  Follow-up for Phone Call        The patient c/o increased SOB with walking and cough with yellow to green mucus. She denies any fever, sore throat or wheezing. She says she has been at the hospital so much with her aunt she thinks she has picked up a bug of some sort. She would like something called to [facility] [address]. Please advise.  ALLERGIES: codeine and darvocet Follow-up by: Michel Bickers CMA,  Jan 27, 2010 2:55 PM  Additional Follow-up for Phone Call Additional follow up Details #1::        Per CDY-give Zpak #1 take as directed no refills.Reynaldo Minium CMA  Jan 27, 2010 5:12 PM     Additional Follow-up for Phone Call Additional follow up Details #2::    The pt is aware of RX for Zpak. She will call if her sxs do not improve or get worse. Follow-up by: Michel Bickers CMA,  Jan 27, 2010 5:16 PM  New/Updated Medications: ZITHROMAX Z-PAK 250 MG TABS (AZITHROMYCIN) Take as directed Prescriptions: ZITHROMAX Z-PAK 250 MG TABS (AZITHROMYCIN) Take as directed  #1 x 0   Entered by:   Michel Bickers CMA   Authorized by:   Waymon Budge MD   Signed by:   Michel Bickers CMA on 01/27/2010   Method used:   Electronically to        [facility]  [phone]* (retail)       [address]       [place], [place]  [address]       Ph: [phone] or [phone]       Fax: [phone]   RxID:   [id number]
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
## 2010-10-18 NOTE — Progress Notes (Signed)
Summary: questions about congestion and sweating  Phone Note Call from Patient   Summary of Call: Pt complaining of "sweating" last night.......Marland KitchenAdvised she got steroids and antibiotics yesterday, and these med are helping to break up the congestion.  Not unusual to have a few days of sweating.   Initial call taken by: Lynann Beaver CMA,  September 30, 2009 8:56 AM  Follow-up for Phone Call        Agree.  We expect this to take a few days to run it's course even with antibiotics.  Encourage her to drink plenty of fluids. Follow-up by: Evelena Peat MD,  September 30, 2009 9:04 AM
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
## 2010-10-18 NOTE — Letter (Signed)
Summary: Out of Work  Adult nurse at [employer]  [address]   [place], [place] [address]   Phone: [phone]  Fax: [phone]            July 28, 2010   Employee:  Yesenia Wilson    To Whom It May Concern:   For Medical reasons, please excuse the above named employee from work for the following dates:  Start:   07/29/2010  End:   07/29/2010  If you need additional information, please feel free to contact our office.         Sincerely,         Evelena Peat, MD

## 2010-10-20 NOTE — Progress Notes (Signed)
Summary: Call A Nurse   Call-A-Nurse Triage Call Report Triage Record Num: [id number] Operator: Hillary Bow Patient Name: Yesenia Wilson Call Date & Time: 09/24/2010 10:49:09AM Patient Phone: [phone] PCP: Evelena Peat Patient Gender: Female PCP Fax : [phone] Patient DOB: 1956-02-10 Practice Name: [facility] Reason for Call: Pt calling about having Cough, onset 2 weeks, w/ green sputum w/ blood streaks. Pt has CP d/t cough. Pt has COPD. Pt had fever few days ago, Pt's themometer broke, Pt doesn't feel warm. Advised Pt to go to ED d/t having CP and blood tinged sputum w/ possible fever. Advised Pt to go to [facility], Pt wants to go to [facility] d/t Lung MD across [facility] care advice given. Protocol(s) Used: Cough - Adult Recommended Outcome per Protocol: See ED Immediately Reason for Outcome: Sudden onset of shortness of breath, difficulty breathing, chest pain OR cough with blood tinged sputum. Care Advice:  ~ Another adult should drive.  ~ Do not give the patient anything to eat or drink. 09/24/2010 10:59:23AM Page 1 of 1 CAN

## 2010-10-20 NOTE — Progress Notes (Signed)
Summary: nerve RX  Phone Note Call from Patient   Caller: Patient Call For: Evelena Peat MD Summary of Call: [phone] Pt is having extreme nervousness, and is asking for RX. [facility] [address]  Initial call taken by: [facility] CMA AAMA,  September 02, 2010 10:51 AM  Follow-up for Phone Call        She had refill of Xanax 08-29-10.  If this is not controlling her anxiety symptoms adequately, she needs f/u to further assess. Follow-up by: Evelena Peat MD,  September 02, 2010 1:29 PM  Additional Follow-up for Phone Call Additional follow up Details #1::        Notified pt. Additional Follow-up by: Lynann Beaver CMA AAMA,  September 02, 2010 1:51 PM
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
## 2010-10-22 ENCOUNTER — Other Ambulatory Visit: Payer: Self-pay | Admitting: Family Medicine

## 2010-10-22 DIAGNOSIS — F419 Anxiety disorder, unspecified: Secondary | ICD-10-CM

## 2010-10-22 DIAGNOSIS — I10 Essential (primary) hypertension: Secondary | ICD-10-CM

## 2010-11-29 ENCOUNTER — Other Ambulatory Visit: Payer: Self-pay | Admitting: Family Medicine

## 2010-12-01 ENCOUNTER — Encounter: Payer: Self-pay | Admitting: Family Medicine

## 2010-12-01 ENCOUNTER — Ambulatory Visit (INDEPENDENT_AMBULATORY_CARE_PROVIDER_SITE_OTHER): Payer: Self-pay | Admitting: Family Medicine

## 2010-12-01 DIAGNOSIS — E039 Hypothyroidism, unspecified: Secondary | ICD-10-CM

## 2010-12-01 DIAGNOSIS — F419 Anxiety disorder, unspecified: Secondary | ICD-10-CM

## 2010-12-01 DIAGNOSIS — F411 Generalized anxiety disorder: Secondary | ICD-10-CM

## 2010-12-01 DIAGNOSIS — E119 Type 2 diabetes mellitus without complications: Secondary | ICD-10-CM

## 2010-12-01 DIAGNOSIS — I10 Essential (primary) hypertension: Secondary | ICD-10-CM

## 2010-12-01 LAB — TSH: TSH: 0.18 u[IU]/mL — ABNORMAL LOW (ref 0.35–5.50)

## 2010-12-01 MED ORDER — ALPRAZOLAM 0.5 MG PO TABS: 0.5000 mg | ORAL_TABLET | Freq: Three times a day (TID) | ORAL | Status: DC | PRN

## 2010-12-01 NOTE — Progress Notes (Signed)
  Subjective:    Patient ID: Yesenia Wilson, female    DOB: Feb 27, 1956, 55 y.o.   MRN: [id number]  HPI  Patient seen with elevated blood pressure 170/115 yesterday per pt reading. Increased stress issues. Currently lives with mother who has Alzheimer's disease. Threat of losing her house. She is behind on house payments. No heat for the past 2 months. Feeling extremely stressed out. No suicidal ideation. She's had chills in the past but states she cannot afford further counseling at this time.   Hypothyroidism which has been over replaced. Currently levothyroxine 112 mcg daily. Due for repeat lab at this time. No weight loss.  No diarrhea.  Hypertension meds reviewed and she is compliant with all. No headaches or dizziness.  No chest pain.   Review of Systems  Constitutional: Positive for fatigue. Negative for fever, chills and unexpected weight change.  Eyes: Negative for visual disturbance.  Respiratory: Negative for cough and shortness of breath.   Cardiovascular: Negative for chest pain, palpitations and leg swelling.  Gastrointestinal: Negative for nausea, vomiting and abdominal pain.  Genitourinary: Negative for dysuria.  Musculoskeletal: Positive for back pain.  Neurological: Negative for dizziness, syncope and headaches.  Psychiatric/Behavioral: Positive for sleep disturbance and dysphoric mood. Negative for confusion. The patient is nervous/anxious.        Objective:   Physical Exam  patient is alert and cooperative and anxious in appearance.  Neck supple no mass Oropharynx is clear. Chest clear auscultation Heart regular rhythm and rate Extremities no edema Neuro no focal weakness.  CN 2-12 normal. Skin no rash.       Assessment & Plan:   #1 situational anxiety. Recommend consideration for counseling through [facility] Department of mental health   #2 hypothyroidism recheck TSH #3 hypertension-stable here today.  Cont to monitor. #3 hypertension with  fair control today. Probably aggravated by recent anxiety and stress

## 2010-12-01 NOTE — Patient Instructions (Signed)
I recommend that you call [facility] for counseling services.

## 2010-12-02 ENCOUNTER — Encounter: Payer: Self-pay | Admitting: Family Medicine

## 2010-12-02 MED ORDER — LEVOTHYROXINE SODIUM 100 MCG PO TABS: 100.0000 ug | ORAL_TABLET | Freq: Every day | ORAL | Status: DC

## 2010-12-02 NOTE — Progress Notes (Signed)
Addended by: Sid Falcon on: 12/02/2010 05:53 PM   Modules accepted: Orders

## 2010-12-02 NOTE — Progress Notes (Signed)
Quick Note:  Pt informed and she voiced her understanding, Rx sent electronically ______

## 2010-12-04 LAB — CBC
HCT: 35.7 % — ABNORMAL LOW (ref 36.0–46.0)
Hemoglobin: 12.4 g/dL (ref 12.0–15.0)
MCHC: 34.6 g/dL (ref 30.0–36.0)
MCV: 90.8 fL (ref 78.0–100.0)
RBC: 3.98 MIL/uL (ref 3.87–5.11)
RDW: 13.4 % (ref 11.5–15.5)
WBC: 16.4 10*3/uL — ABNORMAL HIGH (ref 4.0–10.5)

## 2010-12-04 LAB — DIFFERENTIAL
Basophils Absolute: 0.1 10*3/uL (ref 0.0–0.1)
Basophils Relative: 1 % (ref 0–1)
Lymphocytes Relative: 21 % (ref 12–46)
Lymphocytes Relative: 27 % (ref 12–46)
Lymphs Abs: 4.4 10*3/uL — ABNORMAL HIGH (ref 0.7–4.0)
Monocytes Absolute: 1.1 10*3/uL — ABNORMAL HIGH (ref 0.1–1.0)
Monocytes Relative: 10 % (ref 3–12)
Neutro Abs: 10.4 10*3/uL — ABNORMAL HIGH (ref 1.7–7.7)
Neutro Abs: 7 10*3/uL (ref 1.7–7.7)
Neutrophils Relative %: 64 % (ref 43–77)
Neutrophils Relative %: 66 % (ref 43–77)

## 2010-12-04 LAB — POCT I-STAT, CHEM 8
BUN: 18 mg/dL (ref 6–23)
BUN: 32 mg/dL — ABNORMAL HIGH (ref 6–23)
Calcium, Ion: 1.05 mmol/L — ABNORMAL LOW (ref 1.12–1.32)
Chloride: 105 mEq/L (ref 96–112)
Creatinine, Ser: 1 mg/dL (ref 0.4–1.2)
HCT: 36 % (ref 36.0–46.0)
Hemoglobin: 12.6 g/dL (ref 12.0–15.0)
Potassium: 3.6 mEq/L (ref 3.5–5.1)
Potassium: 4.1 mEq/L (ref 3.5–5.1)
Sodium: 137 mEq/L (ref 135–145)

## 2010-12-04 LAB — URINALYSIS, ROUTINE W REFLEX MICROSCOPIC
Glucose, UA: NEGATIVE mg/dL
Ketones, ur: NEGATIVE mg/dL
Nitrite: NEGATIVE
Specific Gravity, Urine: 1.021 (ref 1.005–1.030)
pH: 5 (ref 5.0–8.0)

## 2010-12-04 LAB — COMPREHENSIVE METABOLIC PANEL
ALT: 17 U/L (ref 0–35)
AST: 18 U/L (ref 0–37)
Albumin: 3.6 g/dL (ref 3.5–5.2)
CO2: 24 mEq/L (ref 19–32)
Calcium: 9.4 mg/dL (ref 8.4–10.5)
GFR calc Af Amer: >60 mL/min (ref >60)
GFR calc non Af Amer: >60 mL/min (ref >60)
Sodium: 135 mEq/L (ref 135–145)
Total Protein: 6.9 g/dL (ref 6.0–8.3)

## 2010-12-04 LAB — CULTURE, BLOOD (ROUTINE X 2): Culture: NO GROWTH

## 2010-12-04 LAB — POCT CARDIAC MARKERS
CKMB, poc: <1 ng/mL — ABNORMAL LOW (ref 1.0–8.0)
Myoglobin, poc: 44.7 ng/mL (ref 12–200)
Myoglobin, poc: 53.7 ng/mL (ref 12–200)

## 2010-12-04 LAB — CK TOTAL AND CKMB (NOT AT ARMC)
Relative Index: INVALID (ref 0.0–2.5)
Relative Index: INVALID (ref 0.0–2.5)
Total CK: 48 U/L (ref 7–177)

## 2010-12-04 LAB — BASIC METABOLIC PANEL
Calcium: 8.9 mg/dL (ref 8.4–10.5)
GFR calc non Af Amer: >60 mL/min (ref >60)
Potassium: 4 mEq/L (ref 3.5–5.1)
Sodium: 137 mEq/L (ref 135–145)

## 2010-12-04 LAB — GLUCOSE, CAPILLARY
Glucose-Capillary: 110 mg/dL — ABNORMAL HIGH (ref 70–99)
Glucose-Capillary: 116 mg/dL — ABNORMAL HIGH (ref 70–99)
Glucose-Capillary: 129 mg/dL — ABNORMAL HIGH (ref 70–99)
Glucose-Capillary: 157 mg/dL — ABNORMAL HIGH (ref 70–99)
Glucose-Capillary: 197 mg/dL — ABNORMAL HIGH (ref 70–99)
Glucose-Capillary: 233 mg/dL — ABNORMAL HIGH (ref 70–99)
Glucose-Capillary: 287 mg/dL — ABNORMAL HIGH (ref 70–99)
Glucose-Capillary: 297 mg/dL — ABNORMAL HIGH (ref 70–99)
Glucose-Capillary: 88 mg/dL (ref 70–99)

## 2010-12-04 LAB — D-DIMER, QUANTITATIVE: D-Dimer, Quant: <0.22 ug/mL-FEU (ref 0.00–0.48)

## 2010-12-04 LAB — URINE CULTURE
Colony Count: NO GROWTH
Culture: NO GROWTH

## 2010-12-04 LAB — LIPID PANEL
HDL: 43 mg/dL (ref >39)
Total CHOL/HDL Ratio: 4 RATIO
VLDL: 34 mg/dL (ref 0–40)

## 2010-12-04 LAB — TSH: TSH: 0.064 u[IU]/mL — ABNORMAL LOW (ref 0.350–4.500)

## 2010-12-04 LAB — T3, FREE: T3, Free: 2.3 pg/mL (ref 2.3–4.2)

## 2010-12-04 LAB — TROPONIN I: Troponin I: 0.02 ng/mL (ref 0.00–0.06)

## 2010-12-05 ENCOUNTER — Ambulatory Visit (INDEPENDENT_AMBULATORY_CARE_PROVIDER_SITE_OTHER): Payer: Medicare Other | Admitting: Family Medicine

## 2010-12-05 ENCOUNTER — Encounter: Payer: Self-pay | Admitting: Family Medicine

## 2010-12-05 DIAGNOSIS — L509 Urticaria, unspecified: Secondary | ICD-10-CM

## 2010-12-05 LAB — GLUCOSE, CAPILLARY
Glucose-Capillary: 124 mg/dL — ABNORMAL HIGH (ref 70–99)
Glucose-Capillary: 146 mg/dL — ABNORMAL HIGH (ref 70–99)
Glucose-Capillary: 170 mg/dL — ABNORMAL HIGH (ref 70–99)
Glucose-Capillary: 192 mg/dL — ABNORMAL HIGH (ref 70–99)
Glucose-Capillary: 195 mg/dL — ABNORMAL HIGH (ref 70–99)
Glucose-Capillary: 234 mg/dL — ABNORMAL HIGH (ref 70–99)
Glucose-Capillary: 293 mg/dL — ABNORMAL HIGH (ref 70–99)

## 2010-12-05 LAB — BASIC METABOLIC PANEL
BUN: 16 mg/dL (ref 6–23)
Calcium: 8.7 mg/dL (ref 8.4–10.5)
Chloride: 103 mEq/L (ref 96–112)
Creatinine, Ser: 1 mg/dL (ref 0.4–1.2)
GFR calc Af Amer: >60 mL/min (ref >60)

## 2010-12-05 LAB — CBC
MCHC: 34.4 g/dL (ref 30.0–36.0)
MCV: 90.3 fL (ref 78.0–100.0)
Platelets: 306 10*3/uL (ref 150–400)
WBC: 19.5 10*3/uL — ABNORMAL HIGH (ref 4.0–10.5)

## 2010-12-05 NOTE — Patient Instructions (Signed)
Hives   (Urticaria)   Hives (urticaria) are itchy, red, swollen patches on the skin. They may change size, shape, and location quickly and repeatedly. Hives that occur deeper in the skin can cause swelling of the hands, feet, and face. Hives may be an allergic reaction to something you or your child ate, touched, or put on the skin. Hives can also be a reaction to cold, heat, viral infections, medication, insect bites, or emotional stress. Often the cause is hard to find. Hives can come and go for several days to several weeks. Hives are not contagious.   HOME CARE INSTRUCTIONS:   If the cause of the hives is known, avoid exposure to that source.   To relieve itching and rash:   Apply cold compresses to the skin or take cool water baths. Do not take or give your child hot baths or showers because the warmth will make the itching worse.   The best medicine for hives is an antihistamine. An antihistamine will not cure hives, but it will reduce their severity. You can use an antihistamine available over the counter, such as Benadryl®. This medicine may make your child sleepy. Teenagers should not drive while using this medicine.   Take or give Benadryl® every 6 hours until the hives are completely gone for 24 hours or as directed.   Your child may have other medications prescribed for itching. Give these as directed by your child's physician.   You or your child should wear loose fitting clothing, including undergarments. Skin irritations may make hives worse.   Follow-up as provided by your caregiver.   SEEK MEDICAL CARE IF:   You or your child still have considerable itching after taking the medication (prescribed or purchased over the counter).   An oral temperature above 101 develops.   Joint swelling or pain occurs.   SEEK IMMEDIATE MEDICAL CARE IF:   Swollen lips or tongue are noticed.   There is difficulty with breathing, swallowing, or tightness in the throat or chest.   Abdominal (belly) pain develops.   Your  child starts acting very sick.   These may be the first signs of a life-threatening allergic reaction. THIS IS AN EMERGENCY. Call 911 for medical help.   MAKE SURE YOU:   Understand these instructions.   Will watch your condition.   Will get help right away if you are not doing well or get worse.   Document Released: 09/04/2005 Document Re-Released: 08/17/2008   [employer]® Patient Information ©2011 [employer].

## 2010-12-05 NOTE — Progress Notes (Signed)
  Subjective:    Patient ID: Yesenia Wilson, female    DOB: [birth year], 55 y.o.   MRN: [id number]  HPI  patient seen with pruritic rash which started yesterday. Onset initially forearms and now involving the legs and back as well. Raised urticarial lesions. No clear precipitant. No recent change of medication other than lowering dose of thyroid. Has had some increased stress recently. No change of soaps or detergents. No dietary changes.   Denies any swelling of her lips or tongue. No dyspnea. No cough. No recent fever. Recent stress issues have settled somewhat. She is not fear losing her house at this time.   Review of Systems  Constitutional: Negative for fever, chills and appetite change.  HENT: Negative for sore throat.   Respiratory: Negative for cough.   Gastrointestinal: Negative for abdominal pain.  Neurological: Negative for headaches.       Objective:   Physical Exam  Constitutional: She appears well-developed and well-nourished. No distress.  HENT:  Head: Normocephalic and atraumatic.  Right Ear: External ear normal.  Left Ear: External ear normal.  Mouth/Throat: No oropharyngeal exudate.  Eyes: Conjunctivae are normal. Left eye exhibits no discharge.  Neck: Normal range of motion. Neck supple. No thyromegaly present.  Cardiovascular: Normal rate, regular rhythm and normal heart sounds.   No murmur heard. Pulmonary/Chest: Effort normal and breath sounds normal. She has no rales.  Lymphadenopathy:    She has no cervical adenopathy.  Skin: Rash: minimally raised erythematous urticarial lesions arms, legs, and trunk.          Assessment & Plan:   urticaria. Uncertain etiology. ?related to recent stress. Continue antihistamine.  Symptoms not relieved with antihistamine thus far. Depo-Medrol 80 mg given.

## 2010-12-09 ENCOUNTER — Encounter: Payer: Self-pay | Admitting: Family Medicine

## 2010-12-09 ENCOUNTER — Ambulatory Visit (INDEPENDENT_AMBULATORY_CARE_PROVIDER_SITE_OTHER): Payer: Medicare Other | Admitting: Family Medicine

## 2010-12-09 VITALS — BP 110/70 | Temp 98.5°F

## 2010-12-09 DIAGNOSIS — L509 Urticaria, unspecified: Secondary | ICD-10-CM

## 2010-12-09 MED ORDER — DOXEPIN HCL 10 MG PO CAPS: 10.0000 mg | ORAL_CAPSULE | Freq: Every day | ORAL | Status: DC

## 2010-12-09 NOTE — Progress Notes (Signed)
  Subjective:    Patient ID: Yesenia Wilson, female    DOB: May 28, 1956, 55 y.o.   MRN: [id number]  HPI  patient seen with continued urticarial lesions. Possibly spreading. Tried Benadryl orally and topically without much improvement. Depo-Medrol 2 days ago without much improvement. Increased stressors recently. No recent change in medications. No change in soaps or detergent. Denies any dyspnea or facial swelling. No cough. Denies fever or chills.   Review of Systems  Constitutional: Positive for fatigue. Negative for fever, chills and appetite change.  HENT: Negative for sore throat and facial swelling.   Respiratory: Negative for cough, shortness of breath and wheezing.   Skin: Positive for rash.  Hematological: Negative for adenopathy. Does not bruise/bleed easily.       Objective:   Physical Exam  patient is alert and nontoxic in appearance.  Oropharynx is clear Eardrums are normal Neck supple to adenopathy Chest good auscultation Heart regular rhythm and rate with no murmur Extremities no edema Skin she has scattered urticarial lesions which are slightly raised and blanch with pressure scattered extremities and trunk       Assessment & Plan:   urticaria-unknown etiology. Continue over-the-counter antihistamine. Short term trial doxepin 10 mg each bedtime.   She knows to hold Trazodone while on Doxepin.  Try the counter Pepcid or Zantac. Touch base next week if not improving

## 2010-12-09 NOTE — Patient Instructions (Signed)
STOP Trazodone. Consider over the counter Pepcid or Zantac for hives. Take the doxepin just at night.

## 2010-12-11 ENCOUNTER — Encounter: Payer: Self-pay | Admitting: Family Medicine

## 2010-12-20 ENCOUNTER — Other Ambulatory Visit: Payer: Self-pay | Admitting: Family Medicine

## 2010-12-20 ENCOUNTER — Ambulatory Visit (INDEPENDENT_AMBULATORY_CARE_PROVIDER_SITE_OTHER): Payer: Medicare Other | Admitting: Family Medicine

## 2010-12-20 ENCOUNTER — Encounter: Payer: Self-pay | Admitting: Family Medicine

## 2010-12-20 VITALS — BP 110/70 | Temp 98.5°F

## 2010-12-20 DIAGNOSIS — L509 Urticaria, unspecified: Secondary | ICD-10-CM

## 2010-12-20 DIAGNOSIS — R21 Rash and other nonspecific skin eruption: Secondary | ICD-10-CM

## 2010-12-20 LAB — COMPREHENSIVE METABOLIC PANEL
ALT: 17 U/L (ref 0–35)
Albumin: 3.9 g/dL (ref 3.5–5.2)
Alkaline Phosphatase: 97 U/L (ref 39–117)
Glucose, Bld: 107 mg/dL — ABNORMAL HIGH (ref 70–99)
Potassium: 4.3 mEq/L (ref 3.5–5.1)
Sodium: 136 mEq/L (ref 135–145)
Total Protein: 7.3 g/dL (ref 6.0–8.3)

## 2010-12-20 LAB — CBC
Hemoglobin: 12.1 g/dL (ref 12.0–15.0)
RDW: 13.4 % (ref 11.5–15.5)
WBC: 17.8 10*3/uL — ABNORMAL HIGH (ref 4.0–10.5)

## 2010-12-20 LAB — URINALYSIS, ROUTINE W REFLEX MICROSCOPIC
Nitrite: NEGATIVE
Specific Gravity, Urine: 1.023 (ref 1.005–1.030)
pH: 5.5 (ref 5.0–8.0)

## 2010-12-20 LAB — DIFFERENTIAL
Basophils Relative: 0 % (ref 0–1)
Eosinophils Absolute: 1.8 10*3/uL — ABNORMAL HIGH (ref 0.0–0.7)
Monocytes Absolute: 0.7 10*3/uL (ref 0.1–1.0)
Monocytes Relative: 4 % (ref 3–12)
Neutrophils Relative %: 61 % (ref 43–77)

## 2010-12-20 LAB — GLUCOSE, CAPILLARY

## 2010-12-20 MED ORDER — METHYLPREDNISOLONE ACETATE 80 MG/ML IJ SUSP
80.0000 mg | Freq: Once | INTRAMUSCULAR | Status: AC
  Administered 2010-12-20: 80 mg via INTRAMUSCULAR

## 2010-12-20 NOTE — Patient Instructions (Signed)
Keep wound dry for the first 24 hours then clean daily with soap and water for one week. Apply topical antibiotic daily for 3-4 days. Keep covered with clean dressing for 4-5 days. Follow up promptly for any signs of infection such as redness, warmth, pain, or drainage. Keep follow up appointment for next week.

## 2010-12-20 NOTE — Progress Notes (Signed)
  Subjective:    Patient ID: Yesenia Wilson, female    DOB: 1956-06-16, 55 y.o.   MRN: [id number]  HPI Patient seen in followup for persistent and worsening rash. Refer to prior note. Only recent new medication is fish oil. No change of laundry detergent or soap. She took Benadryl without relief. Also take an H2 blocker such as Pepcid or Zantac without relief. Minimal if any relief with Depo-Medrol couple weeks ago. We added doxepin 10 mg each bedtime for severe pruritus and rash has worsened with that. She lives with her mother who has not had any similar rash. Patient denies any fever or sore throat. Difficulty sleeping secondary to pruritus. Remains on Benadryl.   Review of Systems  Constitutional: Positive for fatigue. Negative for fever, chills and activity change.  Respiratory: Negative for cough and shortness of breath.   Cardiovascular: Negative for chest pain.  Genitourinary: Negative for dysuria.  Skin: Positive for rash.  Neurological: Negative for headaches.  Hematological: Negative for adenopathy. Does not bruise/bleed easily.       Objective:   Physical Exam  Constitutional: She appears well-developed and well-nourished.  Cardiovascular: Normal rate, regular rhythm and normal heart sounds.   No murmur heard. Pulmonary/Chest: Effort normal and breath sounds normal. She has no wheezes. She has no rales.  Musculoskeletal: She exhibits no edema.  Skin:       Patient has multiple scattered slightly raised erythematous blanching lesions mostly upper extremities and trunk. No pustules. Non-scaly. These are nontender.          Assessment & Plan:  Progressive urticaria of uncertain etiology. Leave off on omega-3 supplement. She has tried doxepin, H2 blockers, and antihistamines without improvement. Depo-Medrol 80 mg IM given. Discussed risk and benefits of punch biopsy to confirm diagnosis and patient consents. Right upper arm lesion was chosen and skin prepped with  Betadine. Anesthesia 1% Xylocaine with epinephrine. Using sterile technique 5 mm punch biopsy taken. Minimal bleeding. Wound closed with one suture 4-0 Ethilon. Antibiotic and dressing applied

## 2010-12-22 NOTE — Progress Notes (Signed)
Quick Note:  Pt informed ______ 

## 2010-12-28 ENCOUNTER — Ambulatory Visit: Payer: Self-pay | Admitting: Internal Medicine

## 2010-12-29 ENCOUNTER — Ambulatory Visit (INDEPENDENT_AMBULATORY_CARE_PROVIDER_SITE_OTHER): Payer: Medicare Other | Admitting: Family Medicine

## 2010-12-29 ENCOUNTER — Encounter: Payer: Self-pay | Admitting: Family Medicine

## 2010-12-29 DIAGNOSIS — L909 Atrophic disorder of skin, unspecified: Secondary | ICD-10-CM

## 2010-12-29 DIAGNOSIS — E119 Type 2 diabetes mellitus without complications: Secondary | ICD-10-CM

## 2010-12-29 DIAGNOSIS — B009 Herpesviral infection, unspecified: Secondary | ICD-10-CM

## 2010-12-29 DIAGNOSIS — L918 Other hypertrophic disorders of the skin: Secondary | ICD-10-CM

## 2010-12-29 DIAGNOSIS — B001 Herpesviral vesicular dermatitis: Secondary | ICD-10-CM

## 2010-12-29 DIAGNOSIS — L509 Urticaria, unspecified: Secondary | ICD-10-CM

## 2010-12-29 LAB — GLUCOSE, CAPILLARY
Glucose-Capillary: 196 mg/dL — ABNORMAL HIGH (ref 70–99)
Glucose-Capillary: 238 mg/dL — ABNORMAL HIGH (ref 70–99)
Glucose-Capillary: 276 mg/dL — ABNORMAL HIGH (ref 70–99)
Glucose-Capillary: 339 mg/dL — ABNORMAL HIGH (ref 70–99)

## 2010-12-29 LAB — CK TOTAL AND CKMB (NOT AT ARMC)
CK, MB: 0.9 ng/mL (ref 0.3–4.0)
CK, MB: 1 ng/mL (ref 0.3–4.0)
Relative Index: INVALID (ref 0.0–2.5)
Total CK: 36 U/L (ref 7–177)

## 2010-12-29 LAB — TROPONIN I
Troponin I: 0.02 ng/mL (ref 0.00–0.06)
Troponin I: <0.01 ng/mL (ref 0.00–0.06)

## 2010-12-29 LAB — HEMOGLOBIN A1C: Hgb A1c MFr Bld: 6.3 % — ABNORMAL HIGH (ref 4.6–6.1)

## 2010-12-29 LAB — DIFFERENTIAL
Basophils Absolute: 0 10*3/uL (ref 0.0–0.1)
Eosinophils Relative: 4 % (ref 0–5)
Monocytes Absolute: 0.7 10*3/uL (ref 0.1–1.0)
Neutrophils Relative %: 77 % (ref 43–77)

## 2010-12-29 LAB — CULTURE, RESPIRATORY W GRAM STAIN: Culture: NORMAL

## 2010-12-29 LAB — TSH: TSH: 0.037 u[IU]/mL — ABNORMAL LOW (ref 0.350–4.500)

## 2010-12-29 LAB — BASIC METABOLIC PANEL
BUN: 10 mg/dL (ref 6–23)
CO2: 28 mEq/L (ref 19–32)
Chloride: 97 mEq/L (ref 96–112)
Creatinine, Ser: 0.91 mg/dL (ref 0.4–1.2)

## 2010-12-29 LAB — D-DIMER, QUANTITATIVE: D-Dimer, Quant: 0.29 ug/mL-FEU (ref 0.00–0.48)

## 2010-12-29 LAB — CULTURE, BLOOD (ROUTINE X 2): Culture: NO GROWTH

## 2010-12-29 LAB — CBC
MCHC: 33.6 g/dL (ref 30.0–36.0)
MCV: 88.7 fL (ref 78.0–100.0)
Platelets: 285 10*3/uL (ref 150–400)

## 2010-12-29 LAB — EXPECTORATED SPUTUM ASSESSMENT W GRAM STAIN, RFLX TO RESP C

## 2010-12-29 LAB — PROTIME-INR: Prothrombin Time: 13.5 seconds (ref 11.6–15.2)

## 2010-12-29 MED ORDER — VALACYCLOVIR HCL 1 G PO TABS: ORAL_TABLET | ORAL | Status: DC

## 2010-12-29 NOTE — Progress Notes (Signed)
  Subjective:    Patient ID: Yesenia Wilson, female    DOB: Apr 14, 1956, 55 y.o.   MRN: [id number]  HPI Patient seen for the following issues. Multiple urticarial lesions over the past few weeks. Recent biopsy consistent with urticaria. No hyphae or fungal elements. She's been on multiple medications including H2 blockers, Benadryl, doxepin, and prednisone without relief. Still getting some new lesions. We had her stop omega-3 supplement .   She is still taking some over-the-counter supplements which we have encouraged her stop this time. No recent change in diet, soap, or laundry detergent.  Patient has type 2 diabetes which has remained well controlled. Fasting blood sugars around 120.  Recurrent cold sores currently right lower lip. Requesting consideration for something for prevention for future. Has never used Valtrex.  Patient has skin tag right lateral breast which she request excision of. Irritating because of location. No itching or bleeding   Review of Systems  Constitutional: Negative for fever and chills.  Respiratory: Negative for cough and shortness of breath.   Cardiovascular: Negative for chest pain and leg swelling.  Skin: Positive for rash.  Hematological: Negative for adenopathy. Does not bruise/bleed easily.       Objective:   Physical Exam  Constitutional: She is oriented to person, place, and time. She appears well-developed and well-nourished.  HENT:  Right Ear: External ear normal.  Left Ear: External ear normal.       Small cold sore right lower lip otherwise oropharyngeal exam unrevealing  Neck: No thyromegaly present.  Cardiovascular: Normal rate, regular rhythm and normal heart sounds.   No murmur heard. Pulmonary/Chest: Effort normal and breath sounds normal. She has no wheezes. She has no rales.  Abdominal: Soft. There is no tenderness.  Lymphadenopathy:    She has no cervical adenopathy.  Neurological: She is alert and oriented to person, place, and  time.  Skin:       Patient has multiple blanching slightly raised erythematous nonscaly urticarial lesions upper extremities, trunk, and lower extremity. These are essentially unchanged from last visit. Site of biopsy right arm well healed. Sutures removed          Assessment & Plan:  #1 persistent urticaria of uncertain etiology. Consider allergy referral.  Discontinue doxepin at this time. No further prednisone. #2 recurrent cold sores. Valtrex 1 g 2 at onset and 2 12 hours later #3 skin tag right lateral breast. Discussed risks and benefits of excision. Patient consented.  Anesthesia Xylocaine with epinephrine. Prepped with Betadine. Using #15 blade superficial shave excision. Minimal bleeding controlled Drysol.  Antibiotic and dressing applied. #4 type 2 diabetes well controlled

## 2010-12-29 NOTE — Patient Instructions (Signed)
Leave off all over the counter supplements at this time. Keep wound dry for the first 24 hours then clean daily with soap and water for one week. Apply topical antibiotic daily for 3-4 days. Keep covered with clean dressing for 4-5 days. Follow up promptly for any signs of infection such as redness, warmth, pain, or drainage.

## 2011-01-03 LAB — DIFFERENTIAL
Basophils Absolute: 0.1 10*3/uL (ref 0.0–0.1)
Basophils Relative: 1 % (ref 0–1)
Eosinophils Absolute: 0.4 10*3/uL (ref 0.0–0.7)
Monocytes Relative: 5 % (ref 3–12)
Neutro Abs: 8.8 10*3/uL — ABNORMAL HIGH (ref 1.7–7.7)
Neutrophils Relative %: 64 % (ref 43–77)

## 2011-01-03 LAB — URINALYSIS, ROUTINE W REFLEX MICROSCOPIC
Glucose, UA: NEGATIVE mg/dL
Ketones, ur: NEGATIVE mg/dL
Protein, ur: NEGATIVE mg/dL
Urobilinogen, UA: 1 mg/dL (ref 0.0–1.0)

## 2011-01-03 LAB — RAPID URINE DRUG SCREEN, HOSP PERFORMED
Amphetamines: NOT DETECTED
Barbiturates: NOT DETECTED
Benzodiazepines: POSITIVE — AB
Cocaine: NOT DETECTED
Opiates: NOT DETECTED

## 2011-01-03 LAB — BASIC METABOLIC PANEL
BUN: 15 mg/dL (ref 6–23)
CO2: 29 mEq/L (ref 19–32)
Calcium: 10.3 mg/dL (ref 8.4–10.5)
Chloride: 95 mEq/L — ABNORMAL LOW (ref 96–112)
Creatinine, Ser: 0.81 mg/dL (ref 0.4–1.2)
GFR calc Af Amer: >60 mL/min (ref >60)
Glucose, Bld: 158 mg/dL — ABNORMAL HIGH (ref 70–99)

## 2011-01-03 LAB — CBC
MCHC: 33.2 g/dL (ref 30.0–36.0)
MCV: 89.1 fL (ref 78.0–100.0)
Platelets: 345 10*3/uL (ref 150–400)
RBC: 4.9 MIL/uL (ref 3.87–5.11)
RDW: 13.1 % (ref 11.5–15.5)

## 2011-01-03 LAB — URINE MICROSCOPIC-ADD ON

## 2011-01-20 ENCOUNTER — Ambulatory Visit (INDEPENDENT_AMBULATORY_CARE_PROVIDER_SITE_OTHER): Payer: Medicare Other | Admitting: Family Medicine

## 2011-01-20 ENCOUNTER — Encounter: Payer: Self-pay | Admitting: Family Medicine

## 2011-01-20 VITALS — BP 130/80 | Temp 98.2°F | Wt 190.0 lb

## 2011-01-20 DIAGNOSIS — R05 Cough: Secondary | ICD-10-CM

## 2011-01-20 MED ORDER — DOXYCYCLINE HYCLATE 100 MG PO CAPS: 100.0000 mg | ORAL_CAPSULE | Freq: Two times a day (BID) | ORAL | Status: AC

## 2011-01-20 NOTE — Patient Instructions (Signed)
Follow up promptly for any fever or worsening cough. 

## 2011-01-20 NOTE — Progress Notes (Signed)
  Subjective:    Patient ID: Yesenia Wilson, female    DOB: 08-31-1956, 55 y.o.   MRN: [id number]  HPI Patient seen with 2 week history of productive cough. Cough productive of green sputum. No significant dyspnea or wheezing. She has history of asthma. She has been compliant with her usual inhalers. Denies nasal congestion. No fever or chills. No dyspnea at rest.  She had some recurrent urticarial type lesions. Apparently she discovered some bedbugs and had her house treated and since then her urticaria are resolving slowly. Biopsy had shown nonspecific urticarial type reaction.   Review of Systems  Constitutional: Negative for fever and chills.  HENT: Negative for sore throat and postnasal drip.   Respiratory: Positive for cough. Negative for shortness of breath and wheezing.   Cardiovascular: Negative for chest pain, palpitations and leg swelling.       Objective:   Physical Exam  Constitutional: She appears well-developed and well-nourished. No distress.  HENT:  Right Ear: External ear normal.  Left Ear: External ear normal.  Mouth/Throat: Oropharynx is clear and moist. No oropharyngeal exudate.  Neck: No thyromegaly present.  Cardiovascular: Normal rate, regular rhythm and normal heart sounds.   Pulmonary/Chest: Effort normal and breath sounds normal. No respiratory distress. She has no wheezes. She has no rales.  Lymphadenopathy:    She has no cervical adenopathy.          Assessment & Plan:  Cough productive over 2 weeks patient with asthma. No evidence for significant reactive airway component at this time. Given duration of symptoms start doxycycline 100 mg twice daily for 7 days
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
## 2011-01-31 NOTE — Procedures (Signed)
NAMEGLYN, Yesenia Wilson       ACCOUNT NO.:  [id number]   MEDICAL RECORD NO.:  [id number]          PATIENT TYPE:  OUT   LOCATION:  SLEEP CENTER                 FACILITY:  [facility]   PHYSICIAN:  Clinton D. Maple Hudson, MD, FCCP, FACPDATE OF BIRTH:  10[birth year]   DATE OF STUDY:  08/25/2008                            NOCTURNAL POLYSOMNOGRAM   REFERRING PHYSICIAN:  Clinton D. Maple Hudson, MD, FCCP, FACP   REFERRING PHYSICIAN:  Clinton D. Young, MD, FCCP, FACP   INDICATION FOR STUDY:  Hypersomnia with sleep apnea.   EPWORTH SLEEPINESS SCORE:  Epworth sleepiness score 13/24.  BMI 37.  Weight 200 pounds.  Height 62 inches.  Neck 15 inches.   MEDICATIONS:  Home medications are charted and reviewed.   SLEEP ARCHITECTURE:  Total sleep time 300 minutes with sleep efficiency  66.7%.  Stage I was 5.2%.  Stage II 88.2%.  Stage III absent.  REM 6.7%  of total sleep time.  Sleep latency 83 minutes.  REM latency 175  minutes.  Wake after sleep onset 67 minutes.  Arousal index 37.4.  No  bedtime medication was taken.   RESPIRATORY DATA:  Apnea-hypopnea index (AHI) 2.4 per hour.  A total of  12 events were scored including 1 central apnea and 11 hypopneas.  Events were nonpositional.  REM AHI 30.  There were insufficient events  to permit CPAP titration by split protocol on the study night.   OXYGEN DATA:  Mild snoring with oxygen desaturation to a nadir of 87%.  Mean oxygen saturation through the study was 93.4% on room air.   CARDIAC DATA:  Normal sinus rhythm.   MOVEMENT-PARASOMNIA:  No significant movement disturbance.  Bathroom x1.   IMPRESSION-RECOMMENDATIONS:  1. Unremarkable sleep architecture for sleep center environment.  2. Occasional respiratory events with sleep disturbance, apnea-      hypopnea index 2.4 per hour, respiratory      disturbance index 2.6 per hour.  Nonpositional events with mild      snoring and oxygen desaturation to a nadir of 87%.      Clinton D. Maple Hudson, MD, [facility],  FACP  Diplomate, Biomedical engineer of Sleep Medicine  Electronically Signed     CDY/MEDQ  D:  08/29/2008 15:13:44  T:  08/29/2008 21:35:20  Job:  [id number]

## 2011-01-31 NOTE — H&P (Signed)
NAMEAVIA, MERKLEY NO.:  [id number]   MEDICAL RECORD NO.:  [id number]          PATIENT TYPE:  INP   LOCATION:  1321                         FACILITY:  [facility]   PHYSICIAN:  Nelda Bucks, MD DATE OF BIRTH:  1956-09-06   DATE OF ADMISSION:  11/16/2008  DATE OF DISCHARGE:                              HISTORY & PHYSICAL   HISTORY OF PRESENT ILLNESS:  This is a 55 year old white female with  history of asthma/bronchitis and additionally COPD listed and  fibromyalgia, who presents with a 4 day history of increasing pleuritic  chest pain as well as shortness of breath over a 4 day duration.  The  chest pain is told to be tight during inspiration only.  Also,  increasing chest pain associated with coughing.  Green sputum has been  increased.  Denies significant fevers.  None of this chest pain is  radiating, none radiates down the arm, none radiates to the jaw.  She  has no fevers, no hemoptysis, no abdominal pain, no nausea, no vomiting,  no diarrhea.  She presented to the emergency room at [facility] and was treated with nebulizer's and standard therapy and did  not respond with symptoms.   ALLERGIES:  CODEINE and TYLOX.   PAST MEDICAL AND SURGICAL HISTORY:  1. Hypothyroidism.  2. OSA.  3. Asthma/COPD.  4. Hypertension.  5. Chronic bronchitis, status post Pneumovax in 2007.  6. Chronic pain.  7. Fibromyalgia.  8. Lupus.  9. Diabetes.  10.Bladder surgery in the past.  11.Operation for OSA, unclear, according to patient.   MEDICATIONS:  1. Estradiol.  2. Avalide.  3. Metoprolol.  4. Nitroglycerin.  5. Lovastatin.  6. Tramadol/Tylenol.  7. Xanax q.6h.  8. Cymbalta.  9. Metformin.  10.Nexium.  11.Ecotrin.  12.Synthroid.   SOCIAL HISTORY:  No drugs, not a drinker, nonsmoker.   FAMILY HISTORY:  Noncontributory.   REVIEW OF SYSTEMS:  Negative for any abdominal pain.  No dysuria.   PHYSICAL EXAMINATION:  VITAL SIGNS:  Temperature 97  degrees, blood  pressure 131/76, heart rate 110, respiratory rate 20, saturation 91 to  99% on nasal cannula oxygen, 2 liters.  GENERAL INSPECTION:  Reveals this patient to be alert and in no  distress.  NECK:  Examination showed an obese neck.  LUNGS:  Mild coarseness, mild end expiratory wheezing heard best at the  bases.  HEART:  S1, S2, regular rate and rhythm.  Tachycardic intermittently.  No rubs, no murmurs.  ABDOMEN:  Belly soft, obese.  Bowel sounds are present, nondistended.  No Murphy's sign.  EXTREMITIES:  Show no edema.  CENTRAL NERVOUS SYSTEM:  Patient is alert and oriented x3.  Nonfocal.   LABORATORY DATA:  Chest x-ray with no acute changes with some mild  interstitial prominence.  White count is elevated at 16.2, hemoglobin is  14, hematocrit is 42, platelet count is 285,000, neutrophils are 77%.  Sodium is low at 134, potassium 4.3, chloride 97, bicarb 28, BUN 10,  creatinine 0.91, glucose 120.  Calcium is 9.8.  Troponin is negative x1.  CPK is low at 56.  EKG shows sinus tachycardia with rate  of 123, no ST  segment changes.   ASSESSMENT AND PLAN:  1. Acute exacerbation of asthma and chronic obstructive pulmonary      disease probable.  2. Rule out bronchitis.  3. Fibromyalgia.  4. Pleuritic chest pain, to rule out ischemia.  5. Leukocytosis.  6. History of lupus.  7. History of obstructive sleep apnea.   PLAN:  The patient will be admitted to the hospital to the floor, have  pulse oximetry checked.  She will be given low dose Solu-Medrol for some  mild intermittent end expiratory wheezing with the patient's symptoms,  which she says she just cannot bring air in well; albuterol and Atrovent  will be provided every 6 hours.  Antibiotics will be provided for  empirically treating bronchitis or community acquired pneumonia with  ceftriaxone and azithromycin with a urine Strep antigen to follow.  Chest x-rays should be followed for progression.  The patient will  have  a sputum Gram stain culture and sensitivity checked.  The patient's  metoprolol will be continued.  Cardiac enzymes will be cycled.  Lovastatin will be continued.  Xanax, to make sure we do not have  withdrawal from any kind of benzodiazepines, will be continued at half  the dosage 0.25 q.8h.  Cymbalta and Protonix will be administered.  Aspirin will be continued.  ADA 1800 calorie diet will be given.  Synthroid will be continued.  A TSH will be obtained.  Subcutaneous  heparin 5,000 t.i.d. will be subcutaneously administered.  The patient  will have a  coagulation panel checked.  The patient will be placed on sliding scale  insulin a.c. and h.s.  Blood cultures will be drawn.  Her pain will be  monitored very closely.  The electrocardiogram does not show any  significant ST segment changes and is clearly pleuritic in nature chest  pain according to the patient's history.      Nelda Bucks, MD  Electronically Signed     DJF/MEDQ  D:  11/17/2008  T:  11/17/2008  Job:  [id number]   cc:   Joni Fears D. Maple Hudson, MD, FCCP, FACP  [facility]-Pulmonary Dept  [address]  [place]  [place] [address]

## 2011-02-03 NOTE — Discharge Summary (Signed)
Yesenia Wilson, Yesenia Wilson       ACCOUNT NO.:  [id number]   MEDICAL RECORD NO.:  [id number]          PATIENT TYPE:  INP   LOCATION:  1321                         FACILITY:  [facility]   PHYSICIAN:  Clinton D. Maple Hudson, MD, FCCP, FACPDATE OF BIRTH:  12/13/1955   DATE OF ADMISSION:  11/17/2008  DATE OF DISCHARGE:  11/19/2008                               DISCHARGE SUMMARY   DISCHARGE DIAGNOSES:  1. Chronic obstructive pulmonary disease with acute exacerbation.  2. Anxiety and depression.  3. Post-traumatic stress disorder.  4. Hypothyroid.  5. History of obstructive sleep apnea.  6. Hypertension NOS.  7. Diabetes mellitus type 2, non-insulin-dependent.  8. Fibromyalgia.   BRIEF HISTORY:  A 55 year old woman with history of asthmatic bronchitis  and chronic obstructive pulmonary disease with fibromyalgia who  presented through the emergency room complaining of 4 days of increased  cough, chest tightness, green sputum but no fever.  She had atypical  chest pains which were nonradiating.  She failed to respond to emergency  room therapy and was admitted for medical inpatient stabilization.   HOSPITAL COURSE:  Treated initially with Rocephin and Zithromax  intravenously, nebulized bronchodilators, intravenous steroids and her  anxiolytic and antidepressant medications and thyroid replacement.  Sliding scale insulin coverage.  Routine DVT prophylaxis.  She showed  gradual medical improvement.  She discussed with the nurse fear and  stress related to her marriage.  Dr. Antonietta Breach was consulted for  psychiatric evaluation with the impression that she had anxiety  disorder, post-traumatic stress disorder.  He made medication  recommendations and recommended to outpatient counseling which she was  receptive to.  At the end of her admission she reported she was sleeping  comfortably using trazodone.  Chest was clear with good air flow.  Residual hyperglycemia was attributable to steroids and  expected to  normalize rapidly as steroids were tapered.   LABORATORY:  On admission WBC 16,200 with hemoglobin 14.3, 77%  neutrophils, normal coagulation.  Sodium 134, glucose 120 on admission.  Hemoglobin A1c 6.3.  Cardiac enzymes were normal without acute injury.  TSH was 0.037.  Blood cultures were negative.  Sputum grew normal flora.  Streptococcal urinary antigen was negative.  EKG showed sinus  tachycardia with septal infarct, age undetermined.   DISCHARGE PLANS:  Medical follow-up for blood sugar and thyroid with Dr.  Doristine Counter in 2-3 weeks at [facility], pulmonary follow-up  with Dr. Maple Hudson in 2-3 weeks with instruction to call for appointment,  psychiatric outpatient follow-up as arranged through Dr. Jeanie Sewer.  Diabetic diet.  Activity as tolerated.  Trazodone 25 mg h.s., prednisone  10 mg tablets to take two daily for 2 days, one daily for 2 days and  then stop, home nebulizer albuterol 0.083% q.i.d. p.r.n. estradiol 2 mg  daily, Avalide 150/12.5 daily, metoprolol 25 mg  daily, Coricidin HBP as needed for cough and cold, lovastatin 20 mg  daily, tramadol 50 mg b.i.d. p.r.n. for pain, Xanax 0.5 mg q.i.d. p.r.n.  anxiety, Cymbalta 60 mg daily, Nexium 40 mg daily, metformin 1000 mg x2  daily, Ecotrin 325 mg daily.  Synthroid was continued at 150 mcg daily  pending  follow-up with her primary physician.      Clinton D. Maple Hudson, MD, [facility], FACP  Electronically Signed     CDY/MEDQ  D:  12/18/2008  T:  12/18/2008  Job:  [id number]   cc:   Marjory Lies, M.D.  Fax: [phone]   Antonietta Breach, M.D.

## 2011-02-03 NOTE — Discharge Summary (Signed)
[name], [name]       ACCOUNT NO.:  [id number]   MEDICAL RECORD NO.:  [id number]          PATIENT TYPE:  INP   LOCATION:  1321                         FACILITY:  [facility]   PHYSICIAN:  Clinton D. Maple Hudson, MD, FCCP, FACPDATE OF BIRTH:  1956-04-16   DATE OF ADMISSION:  11/16/2008  DATE OF DISCHARGE:  11/19/2008                               DISCHARGE SUMMARY   DISCHARGE DIAGNOSES:  1. Chronic obstructive pulmonary disease with acute exacerbation.  2. Anxiety.  3. Post traumatic stress disorder, recurrent depression,      hypothyroidism, obstructive sleep apnea, hypertension,      fibromyalgia, systemic lupus, diabetes mellitus type 2, non-insulin-      dependent, adult onset.   BRIEF HISTORY:  This is a 55 year old woman with a history of asthmatic  bronchitis/COPD, diabetes and fibromyalgia who presented with a 4-day  history of increasing pleuritic chest pain and dyspnea over 4 days prior  to admission.  She had increased cough with green sputum but denied  fever.  Chest pain was atypical.  She had presented to the emergency  room and did not respond to nebulizers and steroid therapy there.  She  was admitted for medical stabilization.  Physical examination on  admission included temperature of 97 degrees, BP 131/76, oxygen  saturation 91-99% on 2 liter prongs.  There was end-expiratory wheezing  at the bases, no peripheral edema.  Initial chest x-ray had shown mild  interstitial prominence.  White blood count was elevated at 16,200 with  77% neutrophils.  Cardiac enzymes were normal.   HOSPITAL COURSE:  She was treated with IV cefepime and...   Dictation ended at this point.      Clinton D. Maple Hudson, MD, [facility], FACP     CDY/MEDQ  D:  12/18/2008  T:  12/18/2008  Job:  [id number]

## 2011-02-16 ENCOUNTER — Other Ambulatory Visit: Payer: Self-pay | Admitting: Family Medicine

## 2011-02-17 NOTE — Telephone Encounter (Signed)
Ok to refill 

## 2011-02-17 NOTE — Telephone Encounter (Signed)
Last filled 12-01-10, #60 with 0 refills

## 2011-04-09 ENCOUNTER — Other Ambulatory Visit: Payer: Self-pay | Admitting: Family Medicine

## 2011-04-21 ENCOUNTER — Ambulatory Visit (INDEPENDENT_AMBULATORY_CARE_PROVIDER_SITE_OTHER): Payer: Medicare Other | Admitting: Family Medicine

## 2011-04-21 ENCOUNTER — Encounter: Payer: Self-pay | Admitting: Family Medicine

## 2011-04-21 DIAGNOSIS — E039 Hypothyroidism, unspecified: Secondary | ICD-10-CM

## 2011-04-21 DIAGNOSIS — E785 Hyperlipidemia, unspecified: Secondary | ICD-10-CM

## 2011-04-21 DIAGNOSIS — F419 Anxiety disorder, unspecified: Secondary | ICD-10-CM

## 2011-04-21 DIAGNOSIS — F411 Generalized anxiety disorder: Secondary | ICD-10-CM

## 2011-04-21 DIAGNOSIS — I10 Essential (primary) hypertension: Secondary | ICD-10-CM

## 2011-04-21 DIAGNOSIS — E119 Type 2 diabetes mellitus without complications: Secondary | ICD-10-CM

## 2011-04-21 LAB — HM DIABETES FOOT EXAM: HM Diabetic Foot Exam: NORMAL

## 2011-04-21 LAB — HEPATIC FUNCTION PANEL
ALT: 19 U/L (ref 0–35)
AST: 18 U/L (ref 0–37)
Albumin: 4.3 g/dL (ref 3.5–5.2)
Alkaline Phosphatase: 76 U/L (ref 39–117)
Total Protein: 7.5 g/dL (ref 6.0–8.3)

## 2011-04-21 LAB — LIPID PANEL
Cholesterol: 210 mg/dL — ABNORMAL HIGH (ref 0–200)
HDL: 57.1 mg/dL (ref >39.00)
Triglycerides: 186 mg/dL — ABNORMAL HIGH (ref 0.0–149.0)

## 2011-04-21 LAB — BASIC METABOLIC PANEL
CO2: 30 mEq/L (ref 19–32)
Calcium: 9.6 mg/dL (ref 8.4–10.5)
Creatinine, Ser: 0.9 mg/dL (ref 0.4–1.2)
Sodium: 138 mEq/L (ref 135–145)

## 2011-04-21 LAB — TSH: TSH: 14.33 u[IU]/mL — ABNORMAL HIGH (ref 0.35–5.50)

## 2011-04-21 LAB — HEMOGLOBIN A1C: Hgb A1c MFr Bld: 6.2 % (ref 4.6–6.5)

## 2011-04-21 MED ORDER — ALPRAZOLAM 0.5 MG PO TABS: 0.5000 mg | ORAL_TABLET | Freq: Three times a day (TID) | ORAL | Status: DC | PRN

## 2011-04-21 NOTE — Progress Notes (Signed)
  Subjective:    Patient ID: Yesenia Wilson, female    DOB: [birth year], 55 y.o.   MRN: [id number]  HPI Medical followup. History hypothyroidism, type 2 diabetes, hyperlipidemia, hypertension, asthma, fibromyalgia. She states she's felt better past couple months than she has in a few years. Blood sugars fasting averaging 80-110. Remains on metformin. No symptoms of hyperglycemia. Not exercising. Eye exam January of this year no retinopathy.  Hypothyroidism with gradual reduction in levothyroxine. Thyroid still over replaced at last check. Currently takes levothyroxine 100 mcg daily.  Hyperlipidemia treated with Mevacor 20 mg daily. No myalgias. No history of CAD  Past Medical History  Diagnosis Date  . Hypertension   . Lupus   . Fibromyalgia   . Asthma   . Diabetes mellitus   . Hyperlipidemia   . Depression   . Thyroid disease   . Sleep apnea    Past Surgical History  Procedure Date  . Appendectomy   . Cholecystectomy   . Cesarean section   . Bladder tack   . Septoplasty     reports that she has never smoked. She does not have any smokeless tobacco history on file. She reports that she drinks alcohol. Her drug history not on file. family history is not on file.  She is adopted. Allergies  Allergen Reactions  . Codeine   . Oxycodone-Acetaminophen     REACTION: GI upset  . Propoxyphene N-Acetaminophen     REACTION: nausea, rapid heartrate     Review of Systems  Constitutional: Negative for fatigue.  Eyes: Negative for visual disturbance.  Respiratory: Positive for shortness of breath. Negative for cough, chest tightness and wheezing.   Cardiovascular: Negative for chest pain, palpitations and leg swelling.  Gastrointestinal: Negative for abdominal pain.  Genitourinary: Negative for dysuria.  Neurological: Negative for dizziness, seizures, syncope, weakness, light-headedness and headaches.  Psychiatric/Behavioral: Negative for dysphoric mood.       Objective:   Physical Exam  Constitutional: She is oriented to person, place, and time. She appears well-developed and well-nourished.  HENT:  Mouth/Throat: Oropharynx is clear and moist.  Eyes: Pupils are equal, round, and reactive to light.  Neck: Neck supple. No thyromegaly present.  Cardiovascular: Normal rate, regular rhythm and normal heart sounds.   Pulmonary/Chest: Effort normal and breath sounds normal. No respiratory distress. She has no wheezes. She has no rales.  Musculoskeletal: She exhibits no edema.       Feet reveal no skin lesions. Good distal foot pulses. Good capillary refill. No calluses. Normal sensation with monofilament testing   Lymphadenopathy:    She has no cervical adenopathy.  Neurological: She is alert and oriented to person, place, and time.  Skin: No rash noted.  Psychiatric: She has a normal mood and affect. Her behavior is normal.          Assessment & Plan:  #1 type 2 diabetes. Reassess A1c. We recommend she try work on some weight loss and more consistent exercise #2 hyperlipidemia. Check lipid panel/ hepatic panel  #3 hypertension stable #4 hypothyroidism. Recheck TSH.

## 2011-04-25 ENCOUNTER — Other Ambulatory Visit: Payer: Self-pay | Admitting: Family Medicine

## 2011-04-25 MED ORDER — LEVOTHYROXINE SODIUM 112 MCG PO TABS: 112.0000 ug | ORAL_TABLET | Freq: Every day | ORAL | Status: DC

## 2011-04-25 NOTE — Progress Notes (Signed)
Quick Note:  Pt informed and she has fasting 4 month follow-up scheduled, wants to wait for that appt to recheck TSH ______

## 2011-04-25 NOTE — Progress Notes (Signed)
Quick Note:  Pt informed ______ 

## 2011-05-07 ENCOUNTER — Other Ambulatory Visit: Payer: Self-pay | Admitting: Family Medicine

## 2011-05-16 ENCOUNTER — Ambulatory Visit: Payer: Medicare Other | Admitting: Family Medicine

## 2011-05-26 ENCOUNTER — Other Ambulatory Visit: Payer: Self-pay | Admitting: Family Medicine

## 2011-05-29 NOTE — Telephone Encounter (Signed)
May take one tab tid prn.  Last filled 8-3, #60 with 0 refills

## 2011-05-29 NOTE — Telephone Encounter (Signed)
One refill OK

## 2011-06-19 ENCOUNTER — Ambulatory Visit (INDEPENDENT_AMBULATORY_CARE_PROVIDER_SITE_OTHER): Payer: Medicare Other | Admitting: Family Medicine

## 2011-06-19 ENCOUNTER — Encounter: Payer: Self-pay | Admitting: Family Medicine

## 2011-06-19 DIAGNOSIS — R079 Chest pain, unspecified: Secondary | ICD-10-CM

## 2011-06-19 DIAGNOSIS — E039 Hypothyroidism, unspecified: Secondary | ICD-10-CM

## 2011-06-19 DIAGNOSIS — E119 Type 2 diabetes mellitus without complications: Secondary | ICD-10-CM

## 2011-06-19 DIAGNOSIS — R0789 Other chest pain: Secondary | ICD-10-CM

## 2011-06-19 MED ORDER — DULOXETINE HCL 60 MG PO CPEP: 60.0000 mg | ORAL_CAPSULE | Freq: Every day | ORAL | Status: DC

## 2011-06-19 MED ORDER — TRAMADOL HCL 50 MG PO TABS: 50.0000 mg | ORAL_TABLET | Freq: Four times a day (QID) | ORAL | Status: DC | PRN

## 2011-06-19 NOTE — Progress Notes (Signed)
  Subjective:    Patient ID: Yesenia Wilson, female    DOB: [birth year], 55 y.o.   MRN: [id number]  HPI Patient seen today with multiple complaints. She relates episode last night at rest of sharp chest pain mostly right sided less than 5 minute duration. Not associated with activity. No pleuritic pain. Denies any similar pain today. No history of cardiac problems. She does have type 2 diabetes which has been fairly well controlled.  Patient also relates some recent dyspnea with activity. She only started walking again about one week ago. She's had no chest pains whatsoever. No increased edema other than possibly her hands. No orthopnea.   Complains of some recent weight gain. Increased fatigue. Recent TSH elevated at 14 and we titrated Synthroid 112 mcg. Compliant with therapy.  Diabetes has been well controlled. Not checking blood sugars regularly. No symptoms of hyperglycemia. Recent A1c well controlled.  Increased stress with her mother who has dementia. Frequent sleep disturbance secondary to her mother's illness.  Past Medical History  Diagnosis Date  . Hypertension   . Lupus   . Fibromyalgia   . Asthma   . Diabetes mellitus   . Hyperlipidemia   . Depression   . Thyroid disease   . Sleep apnea    Past Surgical History  Procedure Date  . Appendectomy   . Cholecystectomy   . Cesarean section   . Bladder tack   . Septoplasty     reports that she has never smoked. She does not have any smokeless tobacco history on file. She reports that she drinks alcohol. Her drug history not on file. family history is not on file.  She is adopted. Allergies  Allergen Reactions  . Codeine   . Oxycodone-Acetaminophen     REACTION: GI upset  . Propoxyphene N-Acetaminophen     REACTION: nausea, rapid heartrate      Review of Systems  Constitutional: Positive for fatigue and unexpected weight change. Negative for fever and chills.  HENT: Negative for trouble swallowing.   Eyes: Negative  for visual disturbance.  Respiratory: Positive for shortness of breath. Negative for wheezing.   Cardiovascular: Positive for chest pain. Negative for palpitations and leg swelling.  Gastrointestinal: Negative for abdominal pain.  Genitourinary: Negative for dysuria.  Neurological: Negative for dizziness and syncope.       Objective:   Physical Exam  Constitutional: She is oriented to person, place, and time. She appears well-developed and well-nourished.  HENT:  Mouth/Throat: Oropharynx is clear and moist.  Neck: Neck supple. No thyromegaly present.  Cardiovascular: Normal rate, regular rhythm and normal heart sounds.   No murmur heard. Pulmonary/Chest: Effort normal and breath sounds normal. No respiratory distress. She has no wheezes. She has no rales.  Musculoskeletal: She exhibits no edema.  Lymphadenopathy:    She has no cervical adenopathy.  Neurological: She is alert and oriented to person, place, and time. No cranial nerve deficit.  Skin: No rash noted.  Psychiatric: She has a normal mood and affect.          Assessment & Plan:  #1 atypical chest pain. This does not sound cardiac. Symptoms are right sided and sharp quality and very brief duration. No association with activity. EKG sinus rhythm with no acute changes #2 fatigue. Probably multifactorial. Poor sleep quality. No regular exercise. Recent under replaced thyroid.  Increased stress also likely contributing. #3 hypothyroidism. Recheck TSH.

## 2011-06-20 NOTE — Progress Notes (Signed)
Quick Note:  Pt informed at OV ______

## 2011-06-21 ENCOUNTER — Other Ambulatory Visit: Payer: Self-pay | Admitting: Family Medicine

## 2011-06-21 NOTE — Progress Notes (Signed)
Quick Note:  Pt informed on VM ______ 

## 2011-07-15 ENCOUNTER — Other Ambulatory Visit: Payer: Self-pay | Admitting: Family Medicine

## 2011-07-17 NOTE — Telephone Encounter (Signed)
Alprazolam one tab tid prn, last filled #60 with 0 refills on 05-26-2011

## 2011-07-17 NOTE — Telephone Encounter (Signed)
Refill once 

## 2011-07-24 ENCOUNTER — Other Ambulatory Visit: Payer: Self-pay | Admitting: Family Medicine

## 2011-07-24 ENCOUNTER — Ambulatory Visit: Payer: Medicare Other | Admitting: Family Medicine

## 2011-07-24 DIAGNOSIS — Z0289 Encounter for other administrative examinations: Secondary | ICD-10-CM

## 2011-08-22 ENCOUNTER — Ambulatory Visit: Payer: Medicare Other | Admitting: Family Medicine

## 2011-08-29 ENCOUNTER — Other Ambulatory Visit: Payer: Self-pay | Admitting: Family Medicine

## 2011-10-02 ENCOUNTER — Other Ambulatory Visit: Payer: Self-pay | Admitting: Family Medicine

## 2011-10-09 DIAGNOSIS — M25549 Pain in joints of unspecified hand: Secondary | ICD-10-CM | POA: Diagnosis not present

## 2011-10-09 DIAGNOSIS — S63639A Sprain of interphalangeal joint of unspecified finger, initial encounter: Secondary | ICD-10-CM | POA: Diagnosis not present

## 2011-10-17 DIAGNOSIS — S63279A Dislocation of unspecified interphalangeal joint of unspecified finger, initial encounter: Secondary | ICD-10-CM | POA: Diagnosis not present

## 2011-10-20 DIAGNOSIS — S63279A Dislocation of unspecified interphalangeal joint of unspecified finger, initial encounter: Secondary | ICD-10-CM | POA: Diagnosis not present

## 2011-10-23 DIAGNOSIS — S63279A Dislocation of unspecified interphalangeal joint of unspecified finger, initial encounter: Secondary | ICD-10-CM | POA: Diagnosis not present

## 2011-10-26 ENCOUNTER — Encounter: Payer: Self-pay | Admitting: Family Medicine

## 2011-10-26 ENCOUNTER — Ambulatory Visit (INDEPENDENT_AMBULATORY_CARE_PROVIDER_SITE_OTHER): Payer: Medicare Other | Admitting: Family Medicine

## 2011-10-26 VITALS — BP 130/90 | Temp 97.4°F | Wt 197.0 lb

## 2011-10-26 DIAGNOSIS — J329 Chronic sinusitis, unspecified: Secondary | ICD-10-CM

## 2011-10-26 DIAGNOSIS — E119 Type 2 diabetes mellitus without complications: Secondary | ICD-10-CM

## 2011-10-26 DIAGNOSIS — E039 Hypothyroidism, unspecified: Secondary | ICD-10-CM

## 2011-10-26 LAB — TSH: TSH: 4.37 u[IU]/mL (ref 0.35–5.50)

## 2011-10-26 MED ORDER — DOXYCYCLINE HYCLATE 100 MG PO TABS: 100.0000 mg | ORAL_TABLET | Freq: Two times a day (BID) | ORAL | Status: AC

## 2011-10-26 NOTE — Progress Notes (Signed)
  Subjective:    Patient ID: Yesenia Wilson, female    DOB: Dec 31, 1955, 56 y.o.   MRN: [id number]  HPI  Medical followup. She has a couple chronic problems and acute problem. She has hypothyroidism and thyroid was recently was under replaced. We increased her Synthroid to 112 mcg and she needs repeated this time. She does have some ongoing fatigue issues.  Patient has history of type 2 diabetes. History of excellent control metformin. Recent blood sugar stable. We discussed possible reduction in metformin if A1c remains stable. She needs to work on some weight loss.  Acute issue of 3 week history of respiratory symptoms of cough productive of green sputum. She has history of asthma but no recent dyspnea. No obvious wheezing. No fever or chills. Also some greenish nasal discharge. No headaches. Denies sore throat. No hoarseness. Nonsmoker.  Past Medical History  Diagnosis Date  . Hypertension   . Lupus   . Fibromyalgia   . Asthma   . Diabetes mellitus   . Hyperlipidemia   . Depression   . Thyroid disease   . Sleep apnea    Past Surgical History  Procedure Date  . Appendectomy   . Cholecystectomy   . Cesarean section   . Bladder tack   . Septoplasty     reports that she has never smoked. She does not have any smokeless tobacco history on file. She reports that she drinks alcohol. Her drug history not on file. family history is not on file.  She is adopted. Allergies  Allergen Reactions  . Codeine   . Oxycodone-Acetaminophen     REACTION: GI upset  . Propoxyphene N-Acetaminophen     REACTION: nausea, rapid heartrate      Review of Systems  Constitutional: Negative for fever, chills, appetite change and unexpected weight change.  HENT: Positive for congestion.   Respiratory: Positive for cough.   Cardiovascular: Negative for chest pain.  Gastrointestinal: Negative for abdominal pain, diarrhea and constipation.  Neurological: Negative for headaches.       Objective:   Physical Exam  Constitutional: She appears well-developed and well-nourished.  HENT:  Right Ear: External ear normal.  Left Ear: External ear normal.  Mouth/Throat: Oropharynx is clear and moist.  Neck: Neck supple. No thyromegaly present.  Cardiovascular: Normal rate and regular rhythm.   Pulmonary/Chest: Effort normal and breath sounds normal. No respiratory distress. She has no wheezes. She has no rales.  Lymphadenopathy:    She has no cervical adenopathy.          Assessment & Plan:  #1 hypothyroidism. Recently under replaced. Recheck TSH #2 type 2 diabetes. History of excellent control. Reassess A1c. If stable consider tapering back metformin  #3 cough with productive sputum for 3 weeks. Nonfocal exam. Given duration of symptoms start doxycycline 100 mg twice a day for 10 days

## 2011-10-27 ENCOUNTER — Telehealth: Payer: Self-pay | Admitting: *Deleted

## 2011-10-27 DIAGNOSIS — S63279A Dislocation of unspecified interphalangeal joint of unspecified finger, initial encounter: Secondary | ICD-10-CM | POA: Diagnosis not present

## 2011-10-27 NOTE — Telephone Encounter (Signed)
Message copied by Trenton Gammon on Fri Oct 27, 2011  1:14 PM ------      Message from: Kristian Covey      Created: Thu Oct 26, 2011  5:54 PM       Thyroid normal range.  A1C has increased some but at goal.  Work on weight loss.

## 2011-10-27 NOTE — Telephone Encounter (Signed)
Patient is aware of her lab results.  She would like to know if she should stop one of her DM medications to start a new one?

## 2011-10-30 DIAGNOSIS — S63279A Dislocation of unspecified interphalangeal joint of unspecified finger, initial encounter: Secondary | ICD-10-CM | POA: Diagnosis not present

## 2011-10-30 NOTE — Telephone Encounter (Signed)
Pt informed

## 2011-10-30 NOTE — Telephone Encounter (Signed)
Would not make any changes at this time.

## 2011-11-02 DIAGNOSIS — S63279A Dislocation of unspecified interphalangeal joint of unspecified finger, initial encounter: Secondary | ICD-10-CM | POA: Diagnosis not present

## 2011-11-07 DIAGNOSIS — S63279A Dislocation of unspecified interphalangeal joint of unspecified finger, initial encounter: Secondary | ICD-10-CM | POA: Diagnosis not present

## 2011-11-09 ENCOUNTER — Telehealth: Payer: Self-pay | Admitting: Family Medicine

## 2011-11-09 DIAGNOSIS — S63279A Dislocation of unspecified interphalangeal joint of unspecified finger, initial encounter: Secondary | ICD-10-CM | POA: Diagnosis not present

## 2011-11-09 NOTE — Telephone Encounter (Signed)
Pt has yeast inf from [facility].(sinus inf)  Please call in diflucan to [facility] [phone]

## 2011-11-10 MED ORDER — FLUCONAZOLE 150 MG PO TABS: 150.0000 mg | ORAL_TABLET | Freq: Once | ORAL | Status: AC

## 2011-11-10 NOTE — Telephone Encounter (Signed)
Pt informed Rx called in. 

## 2011-11-10 NOTE — Telephone Encounter (Signed)
Fluconazole 150 mg po once

## 2011-11-10 NOTE — Telephone Encounter (Signed)
Please advise 

## 2011-11-21 DIAGNOSIS — S63279A Dislocation of unspecified interphalangeal joint of unspecified finger, initial encounter: Secondary | ICD-10-CM | POA: Diagnosis not present

## 2011-11-24 DIAGNOSIS — S63279A Dislocation of unspecified interphalangeal joint of unspecified finger, initial encounter: Secondary | ICD-10-CM | POA: Diagnosis not present

## 2011-12-01 ENCOUNTER — Other Ambulatory Visit: Payer: Self-pay | Admitting: Family Medicine

## 2011-12-04 DIAGNOSIS — S63279A Dislocation of unspecified interphalangeal joint of unspecified finger, initial encounter: Secondary | ICD-10-CM | POA: Diagnosis not present

## 2011-12-12 DIAGNOSIS — S63279A Dislocation of unspecified interphalangeal joint of unspecified finger, initial encounter: Secondary | ICD-10-CM | POA: Diagnosis not present

## 2011-12-20 DIAGNOSIS — S63279A Dislocation of unspecified interphalangeal joint of unspecified finger, initial encounter: Secondary | ICD-10-CM | POA: Diagnosis not present

## 2011-12-26 DIAGNOSIS — S63279A Dislocation of unspecified interphalangeal joint of unspecified finger, initial encounter: Secondary | ICD-10-CM | POA: Diagnosis not present

## 2011-12-27 ENCOUNTER — Ambulatory Visit (INDEPENDENT_AMBULATORY_CARE_PROVIDER_SITE_OTHER): Payer: Medicare Other | Admitting: Family Medicine

## 2011-12-27 ENCOUNTER — Encounter: Payer: Self-pay | Admitting: Family Medicine

## 2011-12-27 VITALS — BP 112/80 | Temp 98.9°F | Wt 198.0 lb

## 2011-12-27 DIAGNOSIS — R509 Fever, unspecified: Secondary | ICD-10-CM | POA: Diagnosis not present

## 2011-12-27 DIAGNOSIS — J020 Streptococcal pharyngitis: Secondary | ICD-10-CM | POA: Diagnosis not present

## 2011-12-27 DIAGNOSIS — J029 Acute pharyngitis, unspecified: Secondary | ICD-10-CM

## 2011-12-27 MED ORDER — ONDANSETRON 8 MG PO TBDP: 8.0000 mg | ORAL_TABLET | Freq: Three times a day (TID) | ORAL | Status: AC | PRN

## 2011-12-27 MED ORDER — AMOXICILLIN 875 MG PO TABS: 875.0000 mg | ORAL_TABLET | Freq: Two times a day (BID) | ORAL | Status: AC

## 2011-12-27 NOTE — Progress Notes (Signed)
  Subjective:    Patient ID: Yesenia Wilson, female    DOB: 10-14-1955, 56 y.o.   MRN: [id number]  HPI  Acute visit. Sore throat started about 3 days ago. She's had some fever and chills and mild/moderate amount of vomiting. Denies any significant cough. Patient has not had any skin rash. Granddaughter with sore throat couple weeks ago but no other sick exposures.  No diarrhea.  Review of Systems  Constitutional: Positive for fever, chills and fatigue.  HENT: Positive for sore throat.   Respiratory: Negative for cough and shortness of breath.   Cardiovascular: Negative for chest pain.  Skin: Negative for rash.       Objective:   Physical Exam  Constitutional: She appears well-developed and well-nourished.  HENT:  Right Ear: External ear normal.  Left Ear: External ear normal.       Previous uvula excision. Diffuse erythema. No evidence for abscess  Neck: Neck supple.  Cardiovascular: Normal rate and regular rhythm.   Pulmonary/Chest: Effort normal and breath sounds normal. No respiratory distress. She has no wheezes. She has no rales.  Lymphadenopathy:    She has cervical adenopathy.          Assessment & Plan:  Strep pharyngitis. Amoxicillin 825 mg twice daily for 10 days. Zofran 8 mg every 8 hours as needed for nausea

## 2011-12-27 NOTE — Patient Instructions (Signed)

## 2012-01-11 ENCOUNTER — Other Ambulatory Visit: Payer: Self-pay | Admitting: Family Medicine

## 2012-02-01 DIAGNOSIS — S63279A Dislocation of unspecified interphalangeal joint of unspecified finger, initial encounter: Secondary | ICD-10-CM | POA: Diagnosis not present

## 2012-02-13 ENCOUNTER — Other Ambulatory Visit: Payer: Self-pay | Admitting: Family Medicine

## 2012-02-28 ENCOUNTER — Encounter: Payer: Self-pay | Admitting: Family Medicine

## 2012-02-28 ENCOUNTER — Ambulatory Visit (INDEPENDENT_AMBULATORY_CARE_PROVIDER_SITE_OTHER): Payer: Medicare Other | Admitting: Family Medicine

## 2012-02-28 VITALS — BP 122/70 | Temp 98.0°F | Wt 200.0 lb

## 2012-02-28 DIAGNOSIS — J029 Acute pharyngitis, unspecified: Secondary | ICD-10-CM | POA: Diagnosis not present

## 2012-02-28 DIAGNOSIS — E119 Type 2 diabetes mellitus without complications: Secondary | ICD-10-CM

## 2012-02-28 LAB — HEMOGLOBIN A1C: Hgb A1c MFr Bld: 6.5 % (ref 4.6–6.5)

## 2012-02-28 LAB — POCT RAPID STREP A (OFFICE): Rapid Strep A Screen: NEGATIVE

## 2012-02-28 MED ORDER — GLUCOSE BLOOD VI STRP: ORAL_STRIP | Status: DC

## 2012-02-28 MED ORDER — ONETOUCH LANCETS MISC: Status: DC

## 2012-02-28 NOTE — Patient Instructions (Addendum)

## 2012-02-28 NOTE — Progress Notes (Signed)
  Subjective:    Patient ID: Yesenia Wilson, female    DOB: 28-May-1956, 56 y.o.   MRN: [id number]  HPI  Patient seen for the following issues. Acute issue of sore throat started yesterday. Pain with swallowing. Minimal nasal congestion. Minimal cough. Expectorating some green mucus. No fever but had some sweats last night. No alleviating factors.  Type 2 diabetes. Not monitoring blood sugars regularly. No symptoms of hyperglycemia. Last A1c 6.6% several months ago. Takes metformin only. She's had some gradual weight gain over the past couple of years. Not exercising any. Recent TSH normal and on replacement.  Past Medical History  Diagnosis Date  . Hypertension   . Lupus   . Fibromyalgia   . Asthma   . Diabetes mellitus   . Hyperlipidemia   . Depression   . Thyroid disease   . Sleep apnea    Past Surgical History  Procedure Date  . Appendectomy   . Cholecystectomy   . Cesarean section   . Bladder tack   . Septoplasty     reports that she has never smoked. She does not have any smokeless tobacco history on file. She reports that she drinks alcohol. Her drug history not on file. family history is not on file.  She is adopted. Allergies  Allergen Reactions  . Codeine   . Oxycodone-Acetaminophen     REACTION: GI upset  . Propoxyphene-Acetaminophen     REACTION: nausea, rapid heartrate      Review of Systems  Constitutional: Positive for chills and fatigue. Negative for fever.  HENT: Positive for congestion and sore throat.   Eyes: Negative for visual disturbance.  Respiratory: Positive for cough. Negative for shortness of breath and wheezing.   Cardiovascular: Negative for chest pain.  Hematological: Negative for adenopathy.       Objective:   Physical Exam  Constitutional: She appears well-developed and well-nourished.  HENT:  Right Ear: External ear normal.  Left Ear: External ear normal.       Previous you uvulopalatopharyngoplasty.  She has some erythematous  inflamed adenoid tissue. No exudate. White to yellow mucus  Neck: Neck supple.  Cardiovascular: Normal rate and regular rhythm.   Pulmonary/Chest: Effort normal and breath sounds normal. No respiratory distress. She has no wheezes. She has no rales.  Musculoskeletal: She exhibits no edema.  Lymphadenopathy:    She has no cervical adenopathy.          Assessment & Plan:  #1 acute pharyngitis. Rapid strep negative. Probably viral. Treat symptomatically with saltwater gargles and ibuprofen or Tylenol #2 type 2 diabetes. Recheck A1c. Consider options such as Victoza if A1c climbing over 7%.

## 2012-02-29 ENCOUNTER — Telehealth: Payer: Self-pay | Admitting: Family Medicine

## 2012-02-29 NOTE — Telephone Encounter (Signed)
We have discussed possible use of Victoza but her blood sugars have been well controlled so we would not want to add at this time

## 2012-02-29 NOTE — Progress Notes (Signed)
Quick Note:  Pt informed on VM ______ 

## 2012-02-29 NOTE — Telephone Encounter (Signed)
Answered pt questions, she would like to try the medicine discussed at OV yesterday, would like to loose some weight if possible.

## 2012-02-29 NOTE — Telephone Encounter (Signed)
Pt called and has questions re: lab results. Doesn't know what the results mean? Have a diabetic med.

## 2012-03-01 ENCOUNTER — Encounter: Payer: Self-pay | Admitting: Family Medicine

## 2012-03-01 ENCOUNTER — Ambulatory Visit (INDEPENDENT_AMBULATORY_CARE_PROVIDER_SITE_OTHER): Payer: Medicare Other | Admitting: Family Medicine

## 2012-03-01 VITALS — BP 148/96 | Temp 97.9°F | Wt 203.0 lb

## 2012-03-01 DIAGNOSIS — F339 Major depressive disorder, recurrent, unspecified: Secondary | ICD-10-CM | POA: Diagnosis not present

## 2012-03-01 MED ORDER — BUPROPION HCL ER (XL) 150 MG PO TB24: 150.0000 mg | ORAL_TABLET | Freq: Every day | ORAL | Status: DC

## 2012-03-01 NOTE — Telephone Encounter (Signed)
Pt informed, she is struggling with some issues at home and needs to talk to someone.  Scheduled OV for 11:15 today.

## 2012-03-01 NOTE — Progress Notes (Signed)
  Subjective:    Patient ID: Yesenia Wilson, female    DOB: 1956-08-23, 56 y.o.   MRN: [id number]  HPI  Patient was scheduled to discuss depression issues. She had increased stress with taking care of her debilitated mom for some time. She feels mostly stress regarding her recent weight gain. She has become more sedentary over the past several years. Low initiative. Takes Cymbalta 60 mg daily but is not sure this makes much difference. Denies any suicidal ideation. Recent TSH in February normal. Does not do any regular exercise. She knows she tends to overeat out of stress. She has hypothyroidism which is controlled as above.  Type 2 diabetes. Treated with metformin. Recent A1c 6.5%.  Past Medical History  Diagnosis Date  . Hypertension   . Lupus   . Fibromyalgia   . Asthma   . Diabetes mellitus   . Hyperlipidemia   . Depression   . Thyroid disease   . Sleep apnea    Past Surgical History  Procedure Date  . Appendectomy   . Cholecystectomy   . Cesarean section   . Bladder tack   . Septoplasty     reports that she has never smoked. She does not have any smokeless tobacco history on file. She reports that she drinks alcohol. Her drug history not on file. family history is not on file.  She is adopted. Allergies  Allergen Reactions  . Codeine   . Oxycodone-Acetaminophen     REACTION: GI upset  . Propoxyphene-Acetaminophen     REACTION: nausea, rapid heartrate      Review of Systems  Constitutional: Positive for fatigue and unexpected weight change. Negative for appetite change.  Respiratory: Negative for cough and shortness of breath.   Cardiovascular: Negative for chest pain, palpitations and leg swelling.  Gastrointestinal: Negative for abdominal pain.  Neurological: Negative for dizziness.  Psychiatric/Behavioral: Positive for dysphoric mood. Negative for agitation.       Objective:   Physical Exam  Constitutional: She is oriented to person, place, and time. She  appears well-developed and well-nourished.  HENT:  Mouth/Throat: Oropharynx is clear and moist.  Neck: Neck supple. No thyromegaly present.  Cardiovascular: Normal rate and regular rhythm.   Pulmonary/Chest: Effort normal and breath sounds normal. No respiratory distress. She has no wheezes. She has no rales.  Neurological: She is alert and oriented to person, place, and time.  Psychiatric:       Slightly anxious but appropriate in interactions          Assessment & Plan:  Depression and anxiety. Increased situational stressors. Poor coping ability with overeating in relation to stress. We have strongly advised counseling and have given her names and she will set up. We have advised trying to start regular exercise program. We discussed change of medication. We'll stop Cymbalta and start Wellbutrin XL 150 mg once daily. Hopefully this will help with initiative and motivation. Consider titration further at followup in 3 weeks if tolerating well.

## 2012-03-02 DIAGNOSIS — F339 Major depressive disorder, recurrent, unspecified: Secondary | ICD-10-CM | POA: Insufficient documentation

## 2012-03-04 ENCOUNTER — Telehealth: Payer: Self-pay | Admitting: Family Medicine

## 2012-03-04 NOTE — Telephone Encounter (Signed)
Patient called stating that she is broke out all over with poison oak and would like to have something called into her pharmacy. Please advise.

## 2012-03-04 NOTE — Telephone Encounter (Signed)
Let's get her in tomorrow to make sure.  Ok to double book if necessary.

## 2012-03-04 NOTE — Telephone Encounter (Signed)
Yesenia Wilson will schedule

## 2012-03-05 ENCOUNTER — Ambulatory Visit: Payer: Medicare Other | Admitting: Licensed Clinical Social Worker

## 2012-03-05 ENCOUNTER — Ambulatory Visit: Payer: Medicare Other | Admitting: Family Medicine

## 2012-03-22 ENCOUNTER — Ambulatory Visit: Payer: Medicare Other | Admitting: Family Medicine

## 2012-04-04 ENCOUNTER — Ambulatory Visit: Payer: Medicare Other | Admitting: Family Medicine

## 2012-04-08 ENCOUNTER — Ambulatory Visit (INDEPENDENT_AMBULATORY_CARE_PROVIDER_SITE_OTHER): Payer: Medicare Other | Admitting: Family Medicine

## 2012-04-08 ENCOUNTER — Encounter: Payer: Self-pay | Admitting: Family Medicine

## 2012-04-08 VITALS — BP 150/88 | Temp 98.3°F | Wt 204.0 lb

## 2012-04-08 DIAGNOSIS — E119 Type 2 diabetes mellitus without complications: Secondary | ICD-10-CM | POA: Diagnosis not present

## 2012-04-08 DIAGNOSIS — F339 Major depressive disorder, recurrent, unspecified: Secondary | ICD-10-CM | POA: Diagnosis not present

## 2012-04-08 DIAGNOSIS — I10 Essential (primary) hypertension: Secondary | ICD-10-CM | POA: Diagnosis not present

## 2012-04-08 DIAGNOSIS — E785 Hyperlipidemia, unspecified: Secondary | ICD-10-CM

## 2012-04-08 MED ORDER — IRBESARTAN-HYDROCHLOROTHIAZIDE 300-12.5 MG PO TABS: 1.0000 | ORAL_TABLET | Freq: Every day | ORAL | Status: DC

## 2012-04-08 MED ORDER — IRBESARTAN-HYDROCHLOROTHIAZIDE 300-25 MG PO TABS: 1.0000 | ORAL_TABLET | Freq: Every day | ORAL | Status: DC

## 2012-04-08 NOTE — Progress Notes (Signed)
  Subjective:    Patient ID: Yesenia Wilson, female    DOB: November 24, 1955, 56 y.o.   MRN: [id number]  HPI  Followup multiple issues  Long history of chronic anxiety and depression. Recently changed from Cymbalta to Wellbutrin and she has seen some improvement in energy levels and feels her mood is somewhat improved as well. No side effects.  She complains of bilateral mild foot edema. Worse late in the day. Chronic mild dyspnea which is unchanged. Very sedentary. No orthopnea.  Type 2 diabetes. Has been well controlled. Recent A1c 6.5% in June. No symptoms of hyperglycemia. Hypertension treated with Avalide and metoprolol. Compliant with therapy. Blood pressures been ranging around 150 systolic recently. No orthostasis.  Past Medical History  Diagnosis Date  . Hypertension   . Lupus   . Fibromyalgia   . Asthma   . Diabetes mellitus   . Hyperlipidemia   . Depression   . Thyroid disease   . Sleep apnea    Past Surgical History  Procedure Date  . Appendectomy   . Cholecystectomy   . Cesarean section   . Bladder tack   . Septoplasty     reports that she has never smoked. She does not have any smokeless tobacco history on file. She reports that she drinks alcohol. Her drug history not on file. family history is not on file.  She is adopted. Allergies  Allergen Reactions  . Codeine   . Oxycodone-Acetaminophen     REACTION: GI upset  . Propoxyphene-Acetaminophen     REACTION: nausea, rapid heartrate      Review of Systems  Constitutional: Positive for fatigue and unexpected weight change. Negative for appetite change.  Respiratory: Negative for cough and wheezing.   Cardiovascular: Positive for leg swelling. Negative for chest pain and palpitations.  Gastrointestinal: Negative for abdominal pain.  Neurological: Negative for syncope.       Objective:   Physical Exam  Constitutional: She is oriented to person, place, and time. She appears well-developed and well-nourished.   Neck: Neck supple. No thyromegaly present.  Cardiovascular: Normal rate and regular rhythm.   Pulmonary/Chest: Effort normal and breath sounds normal. No respiratory distress. She has no wheezes. She has no rales.  Musculoskeletal:       Trace nonpitting edema of feet bilaterally  Neurological: She is alert and oriented to person, place, and time.  Psychiatric: She has a normal mood and affect. Her behavior is normal. Judgment and thought content normal.          Assessment & Plan:  #1 hypertension. Suboptimal control. Wilson to lose weight. Increased Avalide 300/12.5 one daily. Close blood pressure monitoring at home #2 type 2 diabetes. History of excellent control. Work on [employer] control. Recheck A1c at followup  #3 history of hyperlipidemia. Remains on Mevacor. Recheck lipid and hepatic followup  #4 mild ankle edema. Probably venous stasis related. Elevation and Wilson more exercise.

## 2012-04-10 ENCOUNTER — Telehealth: Payer: Self-pay | Admitting: Family Medicine

## 2012-04-10 NOTE — Telephone Encounter (Signed)
I'm confused.  We just saw her 2 days ago and she stated that Wellbutrin is working very well. Reassure patient that Wellbutrin would not be likely be causing weight gain and in fact is usually associated with some weight loss. If she is gaining weight this is not secondary to Wellbutrin

## 2012-04-10 NOTE — Telephone Encounter (Signed)
Caller: Talonda/Patient; Phone Number: [phone]; Message from caller: Pt calling today 04/10/12 regarding Dr.  Caryl Never changed her Cymbalta to Wellbutrin a little over 1 month ago.  Pt said she would like to go back to her Cymbalta b/c she is having wt gain and having increased appetite.  PHARMACY IS [facility] [phone].  PLEASE CALL PT AT [phone] TO LET HER KNOW THIS HAS BEEN DONE.

## 2012-04-11 NOTE — Telephone Encounter (Signed)
Pt informed on VM

## 2012-04-22 ENCOUNTER — Other Ambulatory Visit: Payer: Self-pay | Admitting: *Deleted

## 2012-04-22 MED ORDER — ESOMEPRAZOLE MAGNESIUM 40 MG PO CPDR: 40.0000 mg | DELAYED_RELEASE_CAPSULE | Freq: Every day | ORAL | Status: DC

## 2012-05-07 ENCOUNTER — Telehealth: Payer: Self-pay | Admitting: Family Medicine

## 2012-05-07 MED ORDER — METFORMIN HCL 1000 MG PO TABS: 1000.0000 mg | ORAL_TABLET | Freq: Two times a day (BID) | ORAL | Status: DC

## 2012-05-07 NOTE — Telephone Encounter (Signed)
Pt called req refill of metFORMIN (GLUCOPHAGE) 1000 MG tablet. Pt is out of med. Pt said that [facility] in [place] faxed over a req last week, but pharmacy said that Dr Caryl Never denied the req. Pt said that she has an appt in October. Pls call in to [facility] in [place].

## 2012-05-30 ENCOUNTER — Ambulatory Visit: Payer: Medicare Other | Admitting: Family Medicine

## 2012-06-20 ENCOUNTER — Encounter: Payer: Self-pay | Admitting: Family Medicine

## 2012-06-20 ENCOUNTER — Ambulatory Visit (INDEPENDENT_AMBULATORY_CARE_PROVIDER_SITE_OTHER): Payer: Medicare Other | Admitting: Family Medicine

## 2012-06-20 VITALS — BP 130/72 | Temp 98.0°F | Wt 192.0 lb

## 2012-06-20 DIAGNOSIS — R6889 Other general symptoms and signs: Secondary | ICD-10-CM | POA: Diagnosis not present

## 2012-06-20 DIAGNOSIS — E039 Hypothyroidism, unspecified: Secondary | ICD-10-CM | POA: Diagnosis not present

## 2012-06-20 DIAGNOSIS — R197 Diarrhea, unspecified: Secondary | ICD-10-CM

## 2012-06-20 DIAGNOSIS — R944 Abnormal results of kidney function studies: Secondary | ICD-10-CM

## 2012-06-20 DIAGNOSIS — E119 Type 2 diabetes mellitus without complications: Secondary | ICD-10-CM

## 2012-06-20 LAB — BASIC METABOLIC PANEL
BUN: 60 mg/dL — ABNORMAL HIGH (ref 6–23)
CO2: 22 mEq/L (ref 19–32)
Chloride: 103 mEq/L (ref 96–112)
Creatinine, Ser: 8 mg/dL (ref 0.4–1.2)
Potassium: 4.4 mEq/L (ref 3.5–5.1)

## 2012-06-20 LAB — GLUCOSE, POCT (MANUAL RESULT ENTRY): POC Glucose: 99 mg/dl (ref 70–99)

## 2012-06-20 NOTE — Progress Notes (Addendum)
  Subjective:    Patient ID: Yesenia Wilson, female    DOB: 1955/10/24, 56 y.o.   MRN: [id number]  HPI  Patient relates several day history of diarrhea. Started about 5 days ago. She has not had any fever or chills. No abdominal pain. No recent antibiotic.  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 She has not had any nausea or vomiting. No bloody stools. She's had some weight loss recently which she attributes to stress and not eating well. She was concerned this was related to thyroid. She takes Synthroid 112  daily. Last TSH was normal. Diabetes been well controlled.  Past Medical History  Diagnosis Date  . Hypertension   . Lupus   . Fibromyalgia   . Asthma   . Diabetes mellitus   . Hyperlipidemia   . Depression   . Thyroid disease   . Sleep apnea    Past Surgical History  Procedure Date  . Appendectomy   . Cholecystectomy   . Cesarean section   . Bladder tack   . Septoplasty     reports that she has never smoked. She does not have any smokeless tobacco history on file. She reports that she drinks alcohol. Her drug history not on file. family history is not on file.  She is adopted. Allergies  Allergen Reactions  . Codeine   . Oxycodone-Acetaminophen     REACTION: GI upset  . Propoxyphene-Acetaminophen     REACTION: nausea, rapid heartrate      Review of Systems  Constitutional: Negative for fever, chills, appetite change and unexpected weight change.  Eyes: Negative for visual disturbance.  Respiratory: Negative for shortness of breath.   Cardiovascular: Negative for chest pain.  Gastrointestinal: Positive for diarrhea. Negative for nausea, vomiting and abdominal pain.  Genitourinary: Negative for dysuria.  Neurological: Negative for dizziness and syncope.  Psychiatric/Behavioral: Negative for confusion.       Objective:   Physical Exam  Constitutional: She is oriented to person, place, and time. She appears well-developed  and well-nourished.  HENT:  Mouth/Throat: Oropharynx is clear and moist.  Neck: Neck supple.  Cardiovascular: Normal rate and regular rhythm.   Pulmonary/Chest: Effort normal and breath sounds normal. No respiratory distress. She has no wheezes. She has no rales.  Abdominal: Soft. Bowel sounds are normal. She exhibits no distension and no mass. There is no tenderness. There is no rebound and no guarding.  Musculoskeletal: She exhibits no edema.  Neurological: She is alert and oriented to person, place, and time.          Assessment & Plan:  Diarrhea. Possibly stress related. No evidence for infectious etiology. Try over-the-counter Imodium. Clinically does not appear dehydrated  Type 2 diabetes. Recheck A1c  Hypothyroidism. Check TSH.

## 2012-06-20 NOTE — Patient Instructions (Addendum)
Diarrhea Infections caused by germs (bacterial) or a virus commonly cause diarrhea. Your caregiver has determined that with time, rest and fluids, the diarrhea should improve. In general, eat normally while drinking more water than usual. Although water may prevent dehydration, it does not contain salt and minerals (electrolytes). Broths, weak tea without caffeine and oral rehydration solutions (ORS) replace fluids and electrolytes. Small amounts of fluids should be taken frequently. Large amounts at one time may not be tolerated. Plain water may be harmful in infants and the elderly. Oral rehydrating solutions (ORS) are available at pharmacies and grocery stores. ORS replace water and important electrolytes in proper proportions. Sports drinks are not as effective as ORS and may be harmful due to sugars worsening diarrhea.  ORS is especially recommended for use in children with diarrhea. As a general guideline for children, replace any new fluid losses from diarrhea and/or vomiting with ORS as follows:  If your child weighs 22 pounds or under (10 kg or less), give 60-120 mL ( -  cup or 2 - 4 ounces) of ORS for each episode of diarrheal stool or vomiting episode.  If your child weighs more than 22 pounds (more than 10 kgs), give 120-240 mL ( - 1 cup or 4 - 8 ounces) of ORS for each diarrheal stool or episode of vomiting.  While correcting for dehydration, children should eat normally. However, foods high in sugar should be avoided because this may worsen diarrhea. Large amounts of carbonated soft drinks, juice, gelatin desserts and other highly sugared drinks should be avoided.  After correction of dehydration, other liquids that are appealing to the child may be added. Children should drink small amounts of fluids frequently and fluids should be increased as tolerated. Children should drink enough fluids to keep urine clear or pale yellow.  Adults should eat normally while drinking more fluids than  usual. Drink small amounts of fluids frequently and increase as tolerated. Drink enough fluids to keep urine clear or pale yellow. Broths, weak decaffeinated tea, lemon lime soft drinks (allowed to go flat) and ORS replace fluids and electrolytes.  Avoid:  Carbonated drinks.  Juice.  Extremely hot or cold fluids.  Caffeine drinks.  Fatty, greasy foods.  Alcohol.  Tobacco.  Too much intake of anything at one time.  Gelatin desserts.  Probiotics are active cultures of beneficial bacteria. They may lessen the amount and number of diarrheal stools in adults. Probiotics can be found in yogurt with active cultures and in supplements.  Wash hands well to avoid spreading bacteria and virus.  Anti-diarrheal medications are not recommended for infants and children.  Only take over-the-counter or prescription medicines for pain, discomfort or fever as directed by your caregiver. Do not give aspirin to children because it may cause Reye's Syndrome.  For adults, ask your caregiver if you should continue all prescribed and over-the-counter medicines.  If your caregiver has given you a follow-up appointment, it is very important to keep that appointment. Not keeping the appointment could result in a chronic or permanent injury, and disability. If there is any problem keeping the appointment, you must call back to this facility for assistance. SEEK IMMEDIATE MEDICAL CARE IF:   You or your child is unable to keep fluids down or other symptoms or problems become worse in spite of treatment.  Vomiting or diarrhea develops and becomes persistent.  There is vomiting of blood or bile (green material).  There is blood in the stool or the stools are black and   tarry.  There is no urine output in 6-8 hours or there is only a small amount of very dark urine.  Abdominal pain develops, increases or localizes.  You have a fever.  Your baby is older than 3 months with a rectal temperature of 102 F  (38.9 C) or higher.  Your baby is 86 months old or younger with a rectal temperature of 100.4 F (38 C) or higher.  You or your child develops excessive weakness, dizziness, fainting or extreme thirst.  You or your child develops a rash, stiff neck, severe headache or become irritable or sleepy and difficult to awaken. MAKE SURE YOU:   Understand these instructions.  Will watch your condition.  Will get help right away if you are not doing well or get worse. Document Released: 08/25/2002 Document Revised: 11/27/2011 Document Reviewed: 07/12/2009 [facility] Patient Information 2013 [place], [place].  Try over the counter Imodium for diarrhea

## 2012-06-21 ENCOUNTER — Telehealth: Payer: Self-pay | Admitting: *Deleted

## 2012-06-21 ENCOUNTER — Other Ambulatory Visit (INDEPENDENT_AMBULATORY_CARE_PROVIDER_SITE_OTHER): Payer: Medicare Other

## 2012-06-21 DIAGNOSIS — R6889 Other general symptoms and signs: Secondary | ICD-10-CM | POA: Diagnosis not present

## 2012-06-21 DIAGNOSIS — N179 Acute kidney failure, unspecified: Secondary | ICD-10-CM

## 2012-06-21 DIAGNOSIS — R944 Abnormal results of kidney function studies: Secondary | ICD-10-CM

## 2012-06-21 LAB — BASIC METABOLIC PANEL
CO2: 20 mEq/L (ref 19–32)
Calcium: 9.2 mg/dL (ref 8.4–10.5)
Chloride: 105 mEq/L (ref 96–112)
Creatinine, Ser: 6.2 mg/dL (ref 0.4–1.2)
GFR: 7.35 mL/min — CL (ref >60.00)
Potassium: 4.7 mEq/L (ref 3.5–5.1)

## 2012-06-21 NOTE — Telephone Encounter (Signed)
Phone call received from [facility] lab reporting critical lab; Creatinine 6.2, GFR 7.3, BUN 59  Please advise

## 2012-06-21 NOTE — Telephone Encounter (Signed)
HOLD Metformin and Avalide. Make sure she is not taking ANY NSAIDS-such as ibuprofen or Aleve. Make sure she is keeping down fluids and hydrating well. Repeat BMP this coming Monday. I spoke with nephrologist and will proceed with referral.

## 2012-06-21 NOTE — Telephone Encounter (Signed)
Pt informed, aware she is to hold metformin, avalide.  She is not taking any nsaids, encouraged to drink fluids, BMP ordered and pt scheduled.

## 2012-06-21 NOTE — Progress Notes (Signed)
Quick Note:  Pt aware and is here now for repeat lab ______

## 2012-06-21 NOTE — Addendum Note (Signed)
Addended by: Kristian Covey on: 06/21/2012 07:33 AM   Modules accepted: Orders

## 2012-06-24 ENCOUNTER — Other Ambulatory Visit: Payer: Medicare Other

## 2012-06-24 ENCOUNTER — Encounter: Payer: Self-pay | Admitting: Family Medicine

## 2012-06-24 ENCOUNTER — Ambulatory Visit (INDEPENDENT_AMBULATORY_CARE_PROVIDER_SITE_OTHER): Payer: Medicare Other | Admitting: Family Medicine

## 2012-06-24 VITALS — BP 138/88 | Temp 98.0°F | Wt 191.0 lb

## 2012-06-24 DIAGNOSIS — M545 Low back pain, unspecified: Secondary | ICD-10-CM

## 2012-06-24 DIAGNOSIS — R109 Unspecified abdominal pain: Secondary | ICD-10-CM | POA: Diagnosis not present

## 2012-06-24 DIAGNOSIS — N179 Acute kidney failure, unspecified: Secondary | ICD-10-CM | POA: Diagnosis not present

## 2012-06-24 DIAGNOSIS — R1084 Generalized abdominal pain: Secondary | ICD-10-CM

## 2012-06-24 LAB — CBC WITH DIFFERENTIAL/PLATELET
Basophils Relative: 0.5 % (ref 0.0–3.0)
Eosinophils Relative: 0 % (ref 0.0–5.0)
Lymphocytes Relative: 21.6 % (ref 12.0–46.0)
Neutrophils Relative %: 73.5 % (ref 43.0–77.0)
RBC: 4.41 Mil/uL (ref 3.87–5.11)
WBC: 10.7 10*3/uL — ABNORMAL HIGH (ref 4.5–10.5)

## 2012-06-24 LAB — BASIC METABOLIC PANEL
Calcium: 9.6 mg/dL (ref 8.4–10.5)
Chloride: 103 mEq/L (ref 96–112)
Creatinine, Ser: 1.5 mg/dL — ABNORMAL HIGH (ref 0.4–1.2)
Sodium: 138 mEq/L (ref 135–145)

## 2012-06-24 NOTE — Progress Notes (Signed)
  Subjective:    Patient ID: Yesenia Wilson, female    DOB: [birth year], 56 y.o.   MRN: [id number]  HPI  Patient here for followup labs. Refer to prior note. She presented with 5 days of diarrhea but did not appear clinically dehydrated. She denied any orthostasis. Her labs came back surprisingly with creatinine of 8.0 with BUN of 60. We brought her back Friday the next day creatinine 6.2. She denied any vomiting and no peripheral edema. No prior history of kidney issues. She is being scheduled for followup with nephrology. Over the weekend she had some diffuse low back pains. No injury.  Denies any nonsteroidal use. She's had some chronic low-grade nausea but no vomiting. Drinking fluids well. No fever. Diffuse lower abdominal pain. No dysuria. Not monitoring blood sugars. We held her metformin and Avalide. She is being scheduled to see nephrologist.  She has "lupus" listed on her problem list but I'm not sure what this dx is based on.  We have never seen any suspicious rash or any objective evidence for inflammatory arthritis.  Past Medical History  Diagnosis Date  . Hypertension   . Lupus   . Fibromyalgia   . Asthma   . Diabetes mellitus   . Hyperlipidemia   . Depression   . Thyroid disease   . Sleep apnea    Past Surgical History  Procedure Date  . Appendectomy   . Cholecystectomy   . Cesarean section   . Bladder tack   . Septoplasty     reports that she has never smoked. She does not have any smokeless tobacco history on file. She reports that she drinks alcohol. Her drug history not on file. family history is not on file.  She is adopted. Allergies  Allergen Reactions  . Codeine   . Oxycodone-Acetaminophen     REACTION: GI upset  . Propoxyphene-Acetaminophen     REACTION: nausea, rapid heartrate      Review of Systems  Constitutional: Positive for fatigue. Negative for fever and chills.  Respiratory: Negative for cough and shortness of breath.   Cardiovascular:  Negative for chest pain.  Gastrointestinal: Positive for nausea and abdominal pain. Negative for vomiting.  Genitourinary: Negative for dysuria.  Musculoskeletal: Positive for back pain.  Neurological: Negative for dizziness and syncope.  Hematological: Negative for adenopathy.       Objective:   Physical Exam  Constitutional: She appears well-developed and well-nourished.  HENT:  Mouth/Throat: Oropharynx is clear and moist.  Neck: Neck supple. No thyromegaly present.  Cardiovascular: Normal rate and regular rhythm.   Pulmonary/Chest: Effort normal and breath sounds normal. No respiratory distress. She has no wheezes. She has no rales.  Abdominal: Soft. Bowel sounds are normal. She exhibits no distension and no mass. There is no tenderness. There is no rebound and no guarding.  Musculoskeletal: She exhibits no edema.          Assessment & Plan:  #1 acute renal failure. Recheck basic metabolic panel. Nephrology consult pending. Continue to hold metformin. Continue to hold Avalide. Patient again does not appear clinically dehydrated and has no evidence for a peripheral edema. No recent nonsteroidal use. No other new medications recently.  #2 low back pain. We've again advised no nonsteroidal use. She'll try Tylenol if she needs anything. #3 diffuse abdominal pain.  Non focal/benign exam.  Check CBC.

## 2012-06-24 NOTE — Patient Instructions (Addendum)
Continue to hold metformin and Avalide.

## 2012-06-25 NOTE — Progress Notes (Signed)
Quick Note:  Pt is very relieved with report. She was scheduled at nephrology on 10/22, however received a PC this am and was asked to see Dr Lowell Guitar at 10:30 am Wed. 10/9. FYI ______

## 2012-06-26 DIAGNOSIS — N179 Acute kidney failure, unspecified: Secondary | ICD-10-CM | POA: Diagnosis not present

## 2012-07-08 ENCOUNTER — Other Ambulatory Visit (INDEPENDENT_AMBULATORY_CARE_PROVIDER_SITE_OTHER): Payer: Medicare Other

## 2012-07-08 DIAGNOSIS — N179 Acute kidney failure, unspecified: Secondary | ICD-10-CM

## 2012-07-08 LAB — BASIC METABOLIC PANEL
CO2: 31 mEq/L (ref 19–32)
Glucose, Bld: 148 mg/dL — ABNORMAL HIGH (ref 70–99)
Potassium: 4.3 mEq/L (ref 3.5–5.1)
Sodium: 139 mEq/L (ref 135–145)

## 2012-07-09 NOTE — Progress Notes (Signed)
Quick Note:  Pt informed ______ 

## 2012-07-10 ENCOUNTER — Ambulatory Visit: Payer: Medicare Other | Admitting: Family Medicine

## 2012-07-12 ENCOUNTER — Other Ambulatory Visit: Payer: Medicare Other

## 2012-07-19 ENCOUNTER — Telehealth: Payer: Self-pay | Admitting: Family Medicine

## 2012-07-19 NOTE — Telephone Encounter (Addendum)
Pt wanted results of Lab sent to [facility]. She will come in next week and sign a release for that.

## 2012-08-02 ENCOUNTER — Other Ambulatory Visit: Payer: Self-pay | Admitting: Family Medicine

## 2012-08-09 ENCOUNTER — Other Ambulatory Visit: Payer: Self-pay | Admitting: Family Medicine

## 2012-09-03 ENCOUNTER — Ambulatory Visit (INDEPENDENT_AMBULATORY_CARE_PROVIDER_SITE_OTHER): Payer: Medicare Other | Admitting: Family Medicine

## 2012-09-03 ENCOUNTER — Encounter: Payer: Self-pay | Admitting: Family Medicine

## 2012-09-03 VITALS — BP 160/90 | Temp 98.0°F | Wt 190.0 lb

## 2012-09-03 DIAGNOSIS — E119 Type 2 diabetes mellitus without complications: Secondary | ICD-10-CM | POA: Diagnosis not present

## 2012-09-03 DIAGNOSIS — E785 Hyperlipidemia, unspecified: Secondary | ICD-10-CM

## 2012-09-03 DIAGNOSIS — N179 Acute kidney failure, unspecified: Secondary | ICD-10-CM

## 2012-09-03 LAB — LIPID PANEL
Total CHOL/HDL Ratio: 6
Triglycerides: 233 mg/dL — ABNORMAL HIGH (ref 0.0–149.0)
VLDL: 46.6 mg/dL — ABNORMAL HIGH (ref 0.0–40.0)

## 2012-09-03 LAB — HEPATIC FUNCTION PANEL
AST: 24 U/L (ref 0–37)
Albumin: 4.5 g/dL (ref 3.5–5.2)
Total Bilirubin: 0.7 mg/dL (ref 0.3–1.2)

## 2012-09-03 LAB — LDL CHOLESTEROL, DIRECT: Direct LDL: 180.4 mg/dL

## 2012-09-03 LAB — BASIC METABOLIC PANEL
BUN: 19 mg/dL (ref 6–23)
CO2: 27 mEq/L (ref 19–32)
Chloride: 98 mEq/L (ref 96–112)
Creatinine, Ser: 0.9 mg/dL (ref 0.4–1.2)
Potassium: 3.8 mEq/L (ref 3.5–5.1)

## 2012-09-03 NOTE — Patient Instructions (Addendum)
We will call you regarding lab work-if improved we will have you go back on metformin and Avalide.

## 2012-09-03 NOTE — Progress Notes (Signed)
  Subjective:    Patient ID: Yesenia Wilson, female    DOB: Mar 20, 1956, 56 y.o.   MRN: [id number]  HPI  Patient seen for medical followup. She had recent acute renal failure following GI illness with diarrhea and hypotension. She had been on metformin and Avalide and remains off of both of these. We had intended her go back on those as her last creatinine was near normal range. Her blood pressure has been slightly up since then. No headaches. No dizziness. Not checking blood sugars regularly. She had consistent good control diabetes most recent A1c 6.8%. Hypothyroidism treated with Synthroid. Recent TSH normal. She takes Mevacor for hyperlipidemia. No recent myalgias.  [place] She plans to still drive here for medical checks. She feels this is helping her stress levels with moving to a different location. Went through recent divorce in the past year.  Past Medical History  Diagnosis Date  . Hypertension   . Lupus   . Fibromyalgia   . Asthma   . Diabetes mellitus   . Hyperlipidemia   . Depression   . Thyroid disease   . Sleep apnea    Past Surgical History  Procedure Date  . Appendectomy   . Cholecystectomy   . Cesarean section   . Bladder tack   . Septoplasty     reports that she has never smoked. She does not have any smokeless tobacco history on file. She reports that she drinks alcohol. Her drug history not on file. family history is not on file.  She is adopted. Allergies  Allergen Reactions  . Codeine   . Oxycodone-Acetaminophen     REACTION: GI upset  . Propoxyphene-Acetaminophen     REACTION: nausea, rapid heartrate      Review of Systems  Constitutional: Negative for fatigue.  Eyes: Negative for visual disturbance.  Respiratory: Negative for cough, chest tightness, shortness of breath and wheezing.   Cardiovascular: Negative for chest pain, palpitations and leg swelling.  Genitourinary: Negative for  dysuria.  Neurological: Negative for dizziness, seizures, syncope, weakness, light-headedness and headaches.       Objective:   Physical Exam  Constitutional: She appears well-developed and well-nourished.  Neck: Neck supple. No thyromegaly present.  Cardiovascular: Normal rate and regular rhythm.  Exam reveals no gallop.   Pulmonary/Chest: Effort normal and breath sounds normal. No respiratory distress. She has no wheezes. She has no rales.  Musculoskeletal: She exhibits no edema.          Assessment & Plan:  #1 recent acute renal failure related to combination of ARB in the setting of hypotension and hypovolemia.  Recheck basic metabolic panel. Start back Avalide if creatinine back to normal #2 hypertension. Poorly controlled. Start back Avalide as above depending on basic metabolic results #3 type 2 diabetes. Recent A1c 6.8%. Recheck A1c at followup. Did not checked today since this assessed less than 3 months ago. Start back metformin if creatinine less than 1.4

## 2012-09-04 NOTE — Progress Notes (Signed)
Quick Note:  LMTCB ______ 

## 2012-09-05 ENCOUNTER — Ambulatory Visit: Payer: Medicare Other | Admitting: Family Medicine

## 2012-09-05 ENCOUNTER — Other Ambulatory Visit: Payer: Self-pay | Admitting: Family Medicine

## 2012-09-05 NOTE — Progress Notes (Signed)
Quick Note:  Pt informed ______ 

## 2012-09-13 ENCOUNTER — Telehealth: Payer: Self-pay | Admitting: Family Medicine

## 2012-09-13 DIAGNOSIS — I10 Essential (primary) hypertension: Secondary | ICD-10-CM | POA: Diagnosis not present

## 2012-09-13 DIAGNOSIS — R079 Chest pain, unspecified: Secondary | ICD-10-CM | POA: Diagnosis not present

## 2012-09-13 DIAGNOSIS — F329 Major depressive disorder, single episode, unspecified: Secondary | ICD-10-CM | POA: Diagnosis not present

## 2012-09-13 DIAGNOSIS — E7800 Pure hypercholesterolemia, unspecified: Secondary | ICD-10-CM | POA: Diagnosis not present

## 2012-09-13 DIAGNOSIS — M329 Systemic lupus erythematosus, unspecified: Secondary | ICD-10-CM | POA: Diagnosis not present

## 2012-09-13 DIAGNOSIS — E119 Type 2 diabetes mellitus without complications: Secondary | ICD-10-CM | POA: Diagnosis not present

## 2012-09-13 NOTE — Telephone Encounter (Signed)
Patient Information:  Caller Name: Keiley  Phone: [phone]  Patient: Yesenia Wilson, Yesenia Wilson  Gender: Female  DOB: [birth year]  Age: 56 Years  PCP: Evelena Peat [facility])  Office Follow Up:  Does the office need to follow up with this patient?: No  Instructions For The Office: N/A  RN Note:  She is refusing 911- told to chew 1 ASA now. For best outcome 911 strongly adviced. She is going to go down the street to fire dept to have BP checked.  Symptoms  Reason For Call & Symptoms: Numbness in arms and hands all night for past 3 nights. Felt nauseous this morning and started with deep ache in chest at 0700-09/13/12- lasted 10 min. Pain in chest again at 10:20- lasted 10 min and chest still sore. No shortness of breath.  Reviewed Health History In EMR: Yes  Reviewed Medications In EMR: Yes  Reviewed Allergies In EMR: Yes  Reviewed Surgeries / Procedures: Yes  Date of Onset of Symptoms: 09/13/2012  Guideline(s) Used:  Chest Pain  Disposition Per Guideline:   Call EMS 911 Now  Reason For Disposition Reached:   Chest pain lasting longer than 5 minutes and ANY of the following:  Over 80 years old Over 55 years old and at least one cardiac risk factor (i.e., high blood pressure, diabetes, high cholesterol, obesity, smoker or strong family history of heart disease) Pain is crushing, pressure-like, or heavy  Took nitroglycerin and chest pain was not relieved History of heart disease (i.e., angina, heart attack, bypass surgery, angioplasty, CHF)  Advice Given:  N/A  Patient Refused Recommendation:  Patient Will Go To ED  Refusing 911 will be checked at Fire Dept/ will go to ER or have them call ambulance if advised.

## 2012-09-13 NOTE — Telephone Encounter (Signed)
Agree with advice to call 911. 

## 2012-09-16 ENCOUNTER — Encounter: Payer: Self-pay | Admitting: Family Medicine

## 2012-09-16 ENCOUNTER — Ambulatory Visit (INDEPENDENT_AMBULATORY_CARE_PROVIDER_SITE_OTHER): Payer: Medicare Other | Admitting: Family Medicine

## 2012-09-16 VITALS — BP 130/80 | HR 86 | Temp 98.0°F | Wt 191.0 lb

## 2012-09-16 DIAGNOSIS — R0789 Other chest pain: Secondary | ICD-10-CM | POA: Diagnosis not present

## 2012-09-16 DIAGNOSIS — E119 Type 2 diabetes mellitus without complications: Secondary | ICD-10-CM

## 2012-09-16 NOTE — Progress Notes (Signed)
  Subjective:    Patient ID: Yesenia Wilson, female    DOB: Jun 09, 1956, 56 y.o.   MRN: [id number]  HPI  Patient seen for ER followup. On 09/12/2012 she presented with atypical chest pain to Hospital in [place]. She described left substernal chest pain at rest resting about 10 minutes duration. She had 3 or 4 subsequent episodes each at rest. She described pain which did not radiate. She had atypical symptoms of numbness in both hands. No neck pain. No exertional component. Patient reports previous cardiac catheterization 2006 normal.. Strong family history of premature CAD in brother, sister, and father.  Patient had extensive workup including d-dimer, CT angiogram, chest x-ray, EKG, and lab work which were all basically unremarkable. Cardiac enzymes were negative. Magnesium was minimally low-1.4. Patient has type 2 diabetes and metformin held with CT angio. She was instructed to start this back yesterday but she's still not yet. No recurrent chest pains  Past Medical History  Diagnosis Date  . Hypertension   . Lupus   . Fibromyalgia   . Asthma   . Diabetes mellitus   . Hyperlipidemia   . Depression   . Thyroid disease   . Sleep apnea    Past Surgical History  Procedure Date  . Appendectomy   . Cholecystectomy   . Cesarean section   . Bladder tack   . Septoplasty     reports that she has never smoked. She has never used smokeless tobacco. She reports that she drinks alcohol. She reports that she does not use illicit drugs. family history is not on file.  She is adopted. Allergies  Allergen Reactions  . Codeine   . Oxycodone-Acetaminophen     REACTION: GI upset  . Propoxyphene-Acetaminophen     REACTION: nausea, rapid heartrate      Review of Systems  Constitutional: Negative for fever and appetite change.  Respiratory: Negative for cough and shortness of breath.   Cardiovascular: Negative for chest pain, palpitations and leg swelling.  Gastrointestinal: Negative for  abdominal pain.  Neurological: Negative for syncope.       Objective:   Physical Exam  Constitutional: She appears well-developed and well-nourished.  Neck: Neck supple. No thyromegaly present.  Pulmonary/Chest: Effort normal and breath sounds normal. No respiratory distress. She has no wheezes. She has no rales.  Musculoskeletal: She exhibits no edema.  Lymphadenopathy:    She has no cervical adenopathy.          Assessment & Plan:  Atypical chest pain. Workup above unremarkable. Patient is given okay to start back metformin at this point. Recent creatinine 0.9. Followup for any recurrent chest symptoms.  She has moved 3 hours from here and is encouraged to find primary care provider near her home.

## 2012-09-16 NOTE — Patient Instructions (Addendum)
Start back metformin by tomorrow. Follow up for any recurrent chest pain.

## 2012-09-25 DIAGNOSIS — J40 Bronchitis, not specified as acute or chronic: Secondary | ICD-10-CM | POA: Diagnosis not present

## 2012-10-18 DIAGNOSIS — I10 Essential (primary) hypertension: Secondary | ICD-10-CM | POA: Diagnosis not present

## 2012-10-18 DIAGNOSIS — J449 Chronic obstructive pulmonary disease, unspecified: Secondary | ICD-10-CM | POA: Diagnosis not present

## 2012-10-18 DIAGNOSIS — E119 Type 2 diabetes mellitus without complications: Secondary | ICD-10-CM | POA: Diagnosis not present

## 2012-11-07 ENCOUNTER — Other Ambulatory Visit: Payer: Self-pay | Admitting: Family Medicine

## 2012-11-27 ENCOUNTER — Encounter: Payer: Self-pay | Admitting: Family Medicine

## 2012-11-27 ENCOUNTER — Ambulatory Visit (INDEPENDENT_AMBULATORY_CARE_PROVIDER_SITE_OTHER): Payer: Medicare Other | Admitting: Family Medicine

## 2012-11-27 VITALS — BP 120/70 | Temp 97.3°F | Wt 193.0 lb

## 2012-11-27 DIAGNOSIS — E119 Type 2 diabetes mellitus without complications: Secondary | ICD-10-CM

## 2012-11-27 DIAGNOSIS — E039 Hypothyroidism, unspecified: Secondary | ICD-10-CM

## 2012-11-27 DIAGNOSIS — I10 Essential (primary) hypertension: Secondary | ICD-10-CM

## 2012-11-27 DIAGNOSIS — E785 Hyperlipidemia, unspecified: Secondary | ICD-10-CM

## 2012-11-27 NOTE — Progress Notes (Signed)
  Subjective:    Patient ID: Yesenia Wilson, female    DOB: 1955-10-30, 57 y.o.   MRN: [id number]  HPI Routine medical followup Patient has moved over 3 hours away but has not found primary care physician that she wishes to follow with. Chronic problems include history of depression, hypothyroidism, type 2 diabetes, hypertension, hyperlipidemia, fibromyalgia, and asthma. Patient had episode of acute renal failure back in the fall. She had illness and apparently had some dehydration and acute renal failure while on angiotensin receptor blocker. After holding Avalide and metformin for several days her function promptly returned to normal and has remained there since then.  Type 2 diabetes. History of excellent control. Has had some recent weight gain. Recent poor compliance with diet. No consistent exercise. Not monitoring blood sugars closely. Eye exam in December no retinopathy No history of neuropathy  Hypertension treated with Avalide and metoprolol Atypical chest pain last December. None since then  Past Medical History  Diagnosis Date  . Hypertension   . Lupus   . Fibromyalgia   . Asthma   . Diabetes mellitus   . Hyperlipidemia   . Depression   . Thyroid disease   . Sleep apnea    Past Surgical History  Procedure Laterality Date  . Appendectomy    . Cholecystectomy    . Cesarean section    . Bladder tack    . Septoplasty      reports that she has never smoked. She has never used smokeless tobacco. She reports that  drinks alcohol. She reports that she does not use illicit drugs. family history is not on file. She is adopted. Allergies  Allergen Reactions  . Codeine   . Oxycodone-Acetaminophen     REACTION: GI upset  . Propoxyphene-Acetaminophen     REACTION: nausea, rapid heartrate      Review of Systems  Constitutional: Negative for appetite change, fatigue and unexpected weight change.  Eyes: Negative for visual disturbance.  Respiratory: Negative for cough,  chest tightness, shortness of breath and wheezing.   Cardiovascular: Negative for chest pain, palpitations and leg swelling.  Endocrine: Negative for polydipsia and polyuria.  Neurological: Negative for dizziness, seizures, syncope, weakness, light-headedness and headaches.       Objective:   Physical Exam  Constitutional: She appears well-developed and well-nourished. No distress.  HENT:  Right Ear: External ear normal.  Left Ear: External ear normal.  Mouth/Throat: Oropharynx is clear and moist.  Neck: Neck supple. No thyromegaly present.  Cardiovascular: Normal rate and regular rhythm.   Pulmonary/Chest: Effort normal and breath sounds normal. No respiratory distress. She has no wheezes. She has no rales.  Musculoskeletal: She exhibits no edema.  Lymphadenopathy:    She has no cervical adenopathy.  Skin:  Feet reveal no skin lesions. Good distal foot pulses. Good capillary refill. No calluses. Normal sensation with monofilament testing   Psychiatric: She has a normal mood and affect. Her behavior is normal.          Assessment & Plan:  #1 type 2 diabetes. History of good control. Recent poor compliance with diet and exercise. Recheck A1c. Continue yearly eye exam #2 hypertension. Stable at goal #3 hyperlipidemia. Continue lovastatin. Lipids were checked last fall and stable #4 hypothyroidism. Patient compliant with therapy. TSH at goal few months ago. Will plan to check yearly

## 2012-11-28 NOTE — Progress Notes (Signed)
Quick Note:  Pt informed ______ 

## 2012-11-29 ENCOUNTER — Ambulatory Visit: Payer: Medicare Other | Admitting: Family Medicine

## 2012-12-13 ENCOUNTER — Ambulatory Visit: Payer: Medicare Other | Admitting: Family Medicine

## 2013-02-26 DIAGNOSIS — K219 Gastro-esophageal reflux disease without esophagitis: Secondary | ICD-10-CM | POA: Diagnosis not present

## 2013-02-26 DIAGNOSIS — E039 Hypothyroidism, unspecified: Secondary | ICD-10-CM | POA: Diagnosis not present

## 2013-02-26 DIAGNOSIS — E785 Hyperlipidemia, unspecified: Secondary | ICD-10-CM | POA: Diagnosis not present

## 2013-02-26 DIAGNOSIS — E119 Type 2 diabetes mellitus without complications: Secondary | ICD-10-CM | POA: Diagnosis not present

## 2013-02-26 DIAGNOSIS — R5383 Other fatigue: Secondary | ICD-10-CM | POA: Diagnosis not present

## 2013-02-26 DIAGNOSIS — I10 Essential (primary) hypertension: Secondary | ICD-10-CM | POA: Diagnosis not present

## 2013-02-27 ENCOUNTER — Telehealth: Payer: Self-pay | Admitting: *Deleted

## 2013-02-27 MED ORDER — ESOMEPRAZOLE MAGNESIUM 40 MG PO CPDR: DELAYED_RELEASE_CAPSULE | ORAL | Status: DC

## 2013-02-27 MED ORDER — SYNTHROID 112 MCG PO TABS: ORAL_TABLET | ORAL | Status: DC

## 2013-02-27 MED ORDER — DULOXETINE HCL 60 MG PO CPEP: 60.0000 mg | ORAL_CAPSULE | Freq: Every day | ORAL | Status: DC

## 2013-02-27 NOTE — Telephone Encounter (Signed)
We can switch from Wellbutrin to Cymbalta.  Stop Wellbutrin.  Start Cymbalta 30 mg once daily for one week then increase to 60 mg once daily

## 2013-02-27 NOTE — Telephone Encounter (Signed)
Pt informed Rx will be sent as requested tomorrow

## 2013-02-27 NOTE — Telephone Encounter (Signed)
We received a letter from pt requesting some forms be filled out and a Rx printed and attached for Nexium 40 mg daily, synthroid 112 mg.  Pt somehow lost her Medicare part D medicine coverage with her move, and now her medicines are very expensive.  She is eligible for assistance due to her income, she will get them free. Wellbutrin is $300.00 alone, so pt is requesting going back on Cymbalta 60 mg.  Her stressful times have subsided some and Cymbalta is more affordable.

## 2013-03-18 ENCOUNTER — Ambulatory Visit: Payer: Medicare Other | Admitting: Internal Medicine

## 2013-03-18 ENCOUNTER — Ambulatory Visit (INDEPENDENT_AMBULATORY_CARE_PROVIDER_SITE_OTHER): Payer: Medicare Other | Admitting: Internal Medicine

## 2013-03-18 ENCOUNTER — Encounter: Payer: Self-pay | Admitting: Internal Medicine

## 2013-03-18 ENCOUNTER — Encounter: Payer: Self-pay | Admitting: Family Medicine

## 2013-03-18 VITALS — BP 130/90 | HR 70 | Temp 98.0°F | Resp 20 | Wt 190.0 lb

## 2013-03-18 DIAGNOSIS — I10 Essential (primary) hypertension: Secondary | ICD-10-CM

## 2013-03-18 DIAGNOSIS — H6120 Impacted cerumen, unspecified ear: Secondary | ICD-10-CM | POA: Diagnosis not present

## 2013-03-18 DIAGNOSIS — F339 Major depressive disorder, recurrent, unspecified: Secondary | ICD-10-CM | POA: Diagnosis not present

## 2013-03-18 DIAGNOSIS — H6122 Impacted cerumen, left ear: Secondary | ICD-10-CM

## 2013-03-18 MED ORDER — ALPRAZOLAM 0.5 MG PO TABS: ORAL_TABLET | ORAL | Status: DC

## 2013-03-18 MED ORDER — NEOMYCIN-POLYMYXIN-HC 3.5-10000-1 OT SOLN: 3.0000 [drp] | Freq: Four times a day (QID) | OTIC | Status: DC

## 2013-03-18 NOTE — Patient Instructions (Signed)
Please check your hemoglobin A1c every 3 months  Call or return to clinic prn if these symptoms worsen or fail to improve as anticipated.  

## 2013-03-18 NOTE — Progress Notes (Signed)
Subjective:    Patient ID: Yesenia Wilson, female    DOB: [birth year], 57 y.o.   MRN: [id number]  HPI  57 year old patient who is seen today complaining of pain involving her left ear. She also has had considerable situational stress due to the poor health of her mother and the process of a divorce.  Past Medical History  Diagnosis Date  . Hypertension   . Lupus   . Fibromyalgia   . Asthma   . Diabetes mellitus   . Hyperlipidemia   . Depression   . Thyroid disease   . Sleep apnea     History   Social History  . Marital Status: Single    Spouse Name: N/A    Number of Children: N/A  . Years of Education: N/A   Occupational History  . Not on file.   Social History Main Topics  . Smoking status: Never Smoker   . Smokeless tobacco: Never Used  . Alcohol Use: Yes  . Drug Use: No  . Sexually Active: Not on file   Other Topics Concern  . Not on file   Social History Narrative  . No narrative on file    Past Surgical History  Procedure Laterality Date  . Appendectomy    . Cholecystectomy    . Cesarean section    . Bladder tack    . Septoplasty      Family History  Problem Relation Age of Onset  . Adopted: Yes    Allergies  Allergen Reactions  . Codeine   . Oxycodone-Acetaminophen     REACTION: GI upset  . Propoxyphene-Acetaminophen     REACTION: nausea, rapid heartrate    Current Outpatient Prescriptions on File Prior to Visit  Medication Sig Dispense Refill  . aspirin 81 MG tablet Take 81 mg by mouth daily.        . DULoxetine (CYMBALTA) 60 MG capsule Take 1 capsule (60 mg total) by mouth daily.  90 capsule  3  . esomeprazole (NEXIUM) 40 MG capsule TAKE ONE CAPSULE BY MOUTH IN THE MORNING BEFORE BREAKFAST  90 capsule  3  . glucose blood test strip OneTouch test strip, use 2 times daily as instructed  100 each  3  . irbesartan-hydrochlorothiazide (AVALIDE) 300-12.5 MG per tablet Take 1 tablet by mouth daily.  30 tablet  11  . levalbuterol (XOPENEX HFA)  45 MCG/ACT inhaler Inhale 1-2 puffs into the lungs 4 (four) times daily as needed.        . lovastatin (MEVACOR) 20 MG tablet TAKE TWO TABLETS BY MOUTH EVERY DAY  180 tablet  2  . metFORMIN (GLUCOPHAGE) 1000 MG tablet Take 1 tablet (1,000 mg total) by mouth 2 (two) times daily.  60 tablet  5  . metoprolol tartrate (LOPRESSOR) 25 MG tablet TAKE ONE TABLET BY MOUTH EVERY DAY  30 tablet  11  . ONE TOUCH LANCETS MISC OneTouch lancets, use 2 times daily as directed  200 each  3  . SYNTHROID 112 MCG tablet TAKE ONE TABLET BY MOUTH EVERY DAY  90 tablet  3  . traMADol (ULTRAM) 50 MG tablet Take 1 tablet (50 mg total) by mouth every 6 (six) hours as needed.  120 tablet  5   No current facility-administered medications on file prior to visit.    BP 130/90  Pulse 70  Temp(Src) 98 F (36.7 C) (Oral)  Resp 20  Wt 190 lb (86.183 kg)  BMI 33.67 kg/m2  SpO2 98%  Review of Systems  HENT: Positive for ear pain.        Objective:   Physical Exam  Constitutional: She appears well-developed and well-nourished. No distress.  Repeat blood pressure 124/80  HENT:  Assuming impaction was noted on the left. The canal was irrigated until clear. No abnormalities were noted.          Assessment & Plan:    Left cerumen impaction.  The canal appears normal following irrigation. The patient still is complaining of some discomfort. A prescription for Cortisporin Otic prescribed if symptoms do not improve by morning  Situational stress. Xanax refilled per patient request  Diabetes mellitus. States that she had a hemoglobin A1c recently at another office. She states that she may be transferring to distance herself from her husband

## 2013-04-07 ENCOUNTER — Telehealth: Payer: Self-pay | Admitting: Family Medicine

## 2013-04-07 NOTE — Telephone Encounter (Signed)
Pt states she was seen by Dr. Kirtland Bouchard two weeks ago and was given a hard copy script for DULoxetine (CYMBALTA) 60 MG capsule.  The patient has since lost the script and is requesting this medication to be called in to [facility], [address] [place], [place] [address]. Tel # [phone]  .

## 2013-04-08 MED ORDER — DULOXETINE HCL 60 MG PO CPEP: 60.0000 mg | ORAL_CAPSULE | Freq: Every day | ORAL | Status: DC

## 2013-04-08 NOTE — Telephone Encounter (Signed)
Left detailed message Rx requested was sent to pharmacy call if any problems.

## 2013-04-25 ENCOUNTER — Other Ambulatory Visit: Payer: Self-pay | Admitting: Internal Medicine

## 2013-04-27 NOTE — Telephone Encounter (Signed)
Refill once for #30 but try to avoid regular use to avoid dependency.

## 2013-04-28 NOTE — Telephone Encounter (Signed)
Pt would like top add : traMADol (ULTRAM) 50 MG tablet to her refill order. [facility] [place]

## 2013-04-30 ENCOUNTER — Telehealth: Payer: Self-pay | Admitting: Family Medicine

## 2013-04-30 MED ORDER — TRAMADOL HCL 50 MG PO TABS: 50.0000 mg | ORAL_TABLET | Freq: Four times a day (QID) | ORAL | Status: DC | PRN

## 2013-04-30 NOTE — Telephone Encounter (Signed)
Neither of these medications have been called in to the pharmacy, please call the patient today and make her aware of the status. Thank you!

## 2013-04-30 NOTE — Telephone Encounter (Signed)
Refill tramadol once

## 2013-04-30 NOTE — Telephone Encounter (Signed)
Refills called into pharmacy

## 2013-04-30 NOTE — Telephone Encounter (Signed)
Pt found her rx's.

## 2013-04-30 NOTE — Telephone Encounter (Signed)
Pt wants a refill on her Tramdol  Last refill 06/2011 #120 5 refills

## 2013-05-20 ENCOUNTER — Telehealth: Payer: Self-pay | Admitting: Family Medicine

## 2013-05-20 MED ORDER — ALPRAZOLAM 0.5 MG PO TABS: ORAL_TABLET | ORAL | Status: DC

## 2013-05-20 NOTE — Telephone Encounter (Signed)
Last refill 04/25/13 #30 no refill Last visit 03/18/13 with Dr. Kirtland Bouchard

## 2013-05-20 NOTE — Telephone Encounter (Signed)
Phone in

## 2013-05-20 NOTE — Telephone Encounter (Signed)
Pt is calling to request a refill of her ALPRAZolam (XANAX) 0.5 MG tablet. She states that she is having a hard time dealing with her brothers death, and this is helping her cope. She would like this CALLED INTO [facility] [place] - [address] Please assist.

## 2013-05-20 NOTE — Telephone Encounter (Signed)
Refill once only .. Avoid regular use. 

## 2013-05-30 ENCOUNTER — Ambulatory Visit: Payer: Medicare Other | Admitting: Family Medicine

## 2013-06-02 ENCOUNTER — Ambulatory Visit: Payer: Medicare Other | Admitting: Family Medicine

## 2013-06-25 ENCOUNTER — Ambulatory Visit (INDEPENDENT_AMBULATORY_CARE_PROVIDER_SITE_OTHER): Payer: Medicare Other | Admitting: Family Medicine

## 2013-06-25 ENCOUNTER — Encounter: Payer: Self-pay | Admitting: Family Medicine

## 2013-06-25 VITALS — BP 132/74 | HR 85 | Temp 97.8°F | Wt 183.0 lb

## 2013-06-25 DIAGNOSIS — E039 Hypothyroidism, unspecified: Secondary | ICD-10-CM

## 2013-06-25 DIAGNOSIS — I10 Essential (primary) hypertension: Secondary | ICD-10-CM | POA: Diagnosis not present

## 2013-06-25 DIAGNOSIS — E669 Obesity, unspecified: Secondary | ICD-10-CM | POA: Insufficient documentation

## 2013-06-25 DIAGNOSIS — E785 Hyperlipidemia, unspecified: Secondary | ICD-10-CM | POA: Diagnosis not present

## 2013-06-25 DIAGNOSIS — E119 Type 2 diabetes mellitus without complications: Secondary | ICD-10-CM

## 2013-06-25 LAB — HEMOGLOBIN A1C: Hgb A1c MFr Bld: 6.3 % (ref 4.6–6.5)

## 2013-06-25 LAB — MICROALBUMIN / CREATININE URINE RATIO
Creatinine,U: 58.8 mg/dL
Microalb Creat Ratio: 0.7 mg/g (ref 0.0–30.0)
Microalb, Ur: 0.4 mg/dL (ref 0.0–1.9)

## 2013-06-25 LAB — HM DIABETES FOOT EXAM: HM Diabetic Foot Exam: NORMAL

## 2013-06-25 NOTE — Progress Notes (Signed)
  Subjective:    Patient ID: Yesenia Wilson, female    DOB: 04-Mar-1956, 57 y.o.   MRN: [id number]  HPI Patient here for medical follow up Several months ago [place] and has not yet establish with new primary care. Her chronic problems include type 2 diabetes, hypertension, dyslipidemia, asthma, recurrent depression, hypothyroidism Recently lost her half brother but is coping fairly well. She remains on Cymbalta.  Not monitoring blood sugars regularly. No symptoms of hyperglycemia. She is exercising regularly and has lost 10 pounds since last visit. Feels very good overall. No chest pains. No dyspnea. Compliant with medications.  Hypothyroidism on Synthroid 112 mcg daily. Needs followup lab work. She declines flu vaccine.  Past Medical History  Diagnosis Date  . Hypertension   . Lupus   . Fibromyalgia   . Asthma   . Diabetes mellitus   . Hyperlipidemia   . Depression   . Thyroid disease   . Sleep apnea    Past Surgical History  Procedure Laterality Date  . Appendectomy    . Cholecystectomy    . Cesarean section    . Bladder tack    . Septoplasty      reports that she has never smoked. She has never used smokeless tobacco. She reports that she drinks alcohol. She reports that she does not use illicit drugs. family history is not on file. She was adopted. Allergies  Allergen Reactions  . Codeine   . Oxycodone-Acetaminophen     REACTION: GI upset  . Propoxyphene-Acetaminophen     REACTION: nausea, rapid heartrate      Review of Systems  Constitutional: Negative for fatigue and unexpected weight change.  Eyes: Negative for visual disturbance.  Respiratory: Negative for cough, chest tightness, shortness of breath and wheezing.   Cardiovascular: Negative for chest pain, palpitations and leg swelling.  Neurological: Negative for dizziness, seizures, syncope, weakness, light-headedness and headaches.       Objective:   Physical Exam   Constitutional: She is oriented to person, place, and time. She appears well-developed and well-nourished.  Neck: Neck supple. No thyromegaly present.  Cardiovascular: Normal rate and regular rhythm.   Pulmonary/Chest: Effort normal and breath sounds normal. No respiratory distress. She has no wheezes. She has no rales.  Musculoskeletal: She exhibits no edema.  Neurological: She is alert and oriented to person, place, and time.  Psychiatric: She has a normal mood and affect. Her behavior is normal.          Assessment & Plan:  #1 type 2 diabetes. History of good control. Recheck A1c. Continue yearly exam. Check urine microalbumin screen. She has had acute renal failure previously with ACE inhibitor #2 dyslipidemia. Recheck lipid and hepatic panel #3 hypothyroidism. Recheck TSH #4 health maintenance. Flu vaccine recommended and she declines

## 2013-06-26 LAB — BASIC METABOLIC PANEL
CO2: 25 mEq/L (ref 19–32)
Chloride: 103 mEq/L (ref 96–112)
Glucose, Bld: 98 mg/dL (ref 70–99)
Sodium: 140 mEq/L (ref 135–145)

## 2013-06-26 LAB — HEPATIC FUNCTION PANEL
Albumin: 4.6 g/dL (ref 3.5–5.2)
Total Bilirubin: 0.6 mg/dL (ref 0.3–1.2)

## 2013-06-26 LAB — LIPID PANEL
HDL: 46.9 mg/dL (ref >39.00)
Total CHOL/HDL Ratio: 5
Triglycerides: 161 mg/dL — ABNORMAL HIGH (ref 0.0–149.0)

## 2013-06-26 LAB — TSH: TSH: 0.72 u[IU]/mL (ref 0.35–5.50)

## 2013-06-26 LAB — LDL CHOLESTEROL, DIRECT: Direct LDL: 159 mg/dL

## 2013-06-27 ENCOUNTER — Telehealth: Payer: Self-pay

## 2013-06-27 MED ORDER — ALPRAZOLAM 0.5 MG PO TABS: ORAL_TABLET | ORAL | Status: DC

## 2013-06-27 MED ORDER — METFORMIN HCL 1000 MG PO TABS: 1000.0000 mg | ORAL_TABLET | Freq: Two times a day (BID) | ORAL | Status: DC

## 2013-06-27 NOTE — Telephone Encounter (Signed)
Refill once.  Avoid regular use. 

## 2013-06-27 NOTE — Telephone Encounter (Signed)
Xanax  Last refill 05/20/13 #30 0 refill Last visit 06/25/13

## 2013-06-27 NOTE — Telephone Encounter (Signed)
RX called in .

## 2013-07-09 ENCOUNTER — Telehealth: Payer: Self-pay

## 2013-07-09 NOTE — Telephone Encounter (Signed)
Alprazolam  Last visit 06/25/13 Last refill 06/27/13 #30 0 refill  [facility]

## 2013-07-09 NOTE — Telephone Encounter (Signed)
I recommend avoidance of regular use as this medication can be very difficult to stop.  I would suggest some counseling to help deal with recent stress issues.

## 2013-07-10 NOTE — Telephone Encounter (Signed)
Pt informed

## 2013-08-18 DIAGNOSIS — E039 Hypothyroidism, unspecified: Secondary | ICD-10-CM | POA: Diagnosis not present

## 2013-08-18 DIAGNOSIS — E785 Hyperlipidemia, unspecified: Secondary | ICD-10-CM | POA: Diagnosis not present

## 2013-08-18 DIAGNOSIS — E119 Type 2 diabetes mellitus without complications: Secondary | ICD-10-CM | POA: Diagnosis not present

## 2013-08-18 DIAGNOSIS — I10 Essential (primary) hypertension: Secondary | ICD-10-CM | POA: Diagnosis not present

## 2013-08-19 DIAGNOSIS — J069 Acute upper respiratory infection, unspecified: Secondary | ICD-10-CM | POA: Diagnosis not present

## 2013-09-15 DIAGNOSIS — J45909 Unspecified asthma, uncomplicated: Secondary | ICD-10-CM | POA: Diagnosis not present

## 2013-09-15 DIAGNOSIS — R079 Chest pain, unspecified: Secondary | ICD-10-CM | POA: Diagnosis not present

## 2013-09-15 DIAGNOSIS — J069 Acute upper respiratory infection, unspecified: Secondary | ICD-10-CM | POA: Diagnosis not present

## 2013-11-26 ENCOUNTER — Telehealth: Payer: Self-pay | Admitting: Family Medicine

## 2013-11-26 MED ORDER — ESOMEPRAZOLE MAGNESIUM 40 MG PO CPDR: DELAYED_RELEASE_CAPSULE | ORAL | Status: DC

## 2013-11-26 NOTE — Telephone Encounter (Signed)
[facility]IC[facility] [place], [address] requesting refill of esomeprazole (NEXIUM) 40 MG capsule

## 2013-11-26 NOTE — Telephone Encounter (Signed)
RX sent to pharmacy  

## 2013-12-05 ENCOUNTER — Telehealth: Payer: Self-pay | Admitting: Family Medicine

## 2013-12-05 NOTE — Telephone Encounter (Signed)
Last visit 06/25/13 Last refill 04/30/13 #120 0 refill

## 2013-12-05 NOTE — Telephone Encounter (Signed)
Has she established with someone where she lives?  If not, may refill once.

## 2013-12-05 NOTE — Telephone Encounter (Signed)
MEDICINE [place], [address] is a requesting re-fill on traMADol (ULTRAM) 50 MG tablet

## 2013-12-08 NOTE — Telephone Encounter (Signed)
Called and pt phone state that I had called the wrong code.

## 2013-12-09 NOTE — Telephone Encounter (Signed)
Pt states to please call her at [phone]

## 2013-12-10 DIAGNOSIS — H10029 Other mucopurulent conjunctivitis, unspecified eye: Secondary | ICD-10-CM | POA: Diagnosis not present

## 2013-12-11 MED ORDER — TRAMADOL HCL 50 MG PO TABS: 50.0000 mg | ORAL_TABLET | Freq: Four times a day (QID) | ORAL | Status: DC | PRN

## 2013-12-11 NOTE — Telephone Encounter (Signed)
RX called into the pharmacy. Pt still has not found a doctor at her location.

## 2013-12-11 NOTE — Telephone Encounter (Signed)
Pt waiting on medication. Pt states she has not established care with anyone else.

## 2013-12-16 DIAGNOSIS — I10 Essential (primary) hypertension: Secondary | ICD-10-CM | POA: Diagnosis not present

## 2013-12-16 DIAGNOSIS — M5090 Cervical disc disorder, unspecified, unspecified cervical region: Secondary | ICD-10-CM | POA: Diagnosis not present

## 2013-12-16 DIAGNOSIS — E039 Hypothyroidism, unspecified: Secondary | ICD-10-CM | POA: Diagnosis not present

## 2013-12-16 DIAGNOSIS — M5137 Other intervertebral disc degeneration, lumbosacral region: Secondary | ICD-10-CM | POA: Diagnosis not present

## 2013-12-16 DIAGNOSIS — E785 Hyperlipidemia, unspecified: Secondary | ICD-10-CM | POA: Diagnosis not present

## 2013-12-16 DIAGNOSIS — E119 Type 2 diabetes mellitus without complications: Secondary | ICD-10-CM | POA: Diagnosis not present

## 2013-12-16 DIAGNOSIS — R5381 Other malaise: Secondary | ICD-10-CM | POA: Diagnosis not present

## 2013-12-16 DIAGNOSIS — R5383 Other fatigue: Secondary | ICD-10-CM | POA: Diagnosis not present

## 2013-12-22 DIAGNOSIS — M999 Biomechanical lesion, unspecified: Secondary | ICD-10-CM | POA: Diagnosis not present

## 2013-12-22 DIAGNOSIS — M545 Low back pain, unspecified: Secondary | ICD-10-CM | POA: Diagnosis not present

## 2013-12-22 DIAGNOSIS — M9981 Other biomechanical lesions of cervical region: Secondary | ICD-10-CM | POA: Diagnosis not present

## 2013-12-22 DIAGNOSIS — IMO0001 Reserved for inherently not codable concepts without codable children: Secondary | ICD-10-CM | POA: Diagnosis not present

## 2013-12-25 DIAGNOSIS — M545 Low back pain, unspecified: Secondary | ICD-10-CM | POA: Diagnosis not present

## 2013-12-25 DIAGNOSIS — M9981 Other biomechanical lesions of cervical region: Secondary | ICD-10-CM | POA: Diagnosis not present

## 2013-12-25 DIAGNOSIS — IMO0001 Reserved for inherently not codable concepts without codable children: Secondary | ICD-10-CM | POA: Diagnosis not present

## 2013-12-25 DIAGNOSIS — M999 Biomechanical lesion, unspecified: Secondary | ICD-10-CM | POA: Diagnosis not present

## 2013-12-29 DIAGNOSIS — IMO0001 Reserved for inherently not codable concepts without codable children: Secondary | ICD-10-CM | POA: Diagnosis not present

## 2013-12-29 DIAGNOSIS — M999 Biomechanical lesion, unspecified: Secondary | ICD-10-CM | POA: Diagnosis not present

## 2013-12-29 DIAGNOSIS — M545 Low back pain, unspecified: Secondary | ICD-10-CM | POA: Diagnosis not present

## 2013-12-29 DIAGNOSIS — M9981 Other biomechanical lesions of cervical region: Secondary | ICD-10-CM | POA: Diagnosis not present

## 2014-01-09 ENCOUNTER — Telehealth: Payer: Self-pay | Admitting: Family Medicine

## 2014-01-09 NOTE — Telephone Encounter (Signed)
MEDICINE [place], [address] is requesting re-fill on traMADol (ULTRAM) 50 MG tablet

## 2014-01-12 NOTE — Telephone Encounter (Signed)
Last visit 06/25/13 Last refill 12/11/13 #120 0 refill

## 2014-01-13 NOTE — Telephone Encounter (Signed)
May refill once.  If she is going to get meds still from here we will need to see every 6 months-for diabetes, etc.  I am assuming she has not established with local primary in her town.

## 2014-01-14 MED ORDER — TRAMADOL HCL 50 MG PO TABS: 50.0000 mg | ORAL_TABLET | Freq: Four times a day (QID) | ORAL | Status: DC | PRN

## 2014-01-14 NOTE — Telephone Encounter (Signed)
Rx called into pharmacy. Left message by patient  Daughter because i was not able to get  In contact with the patient per Dr. Elease Hashimoto message.

## 2014-02-27 DIAGNOSIS — L259 Unspecified contact dermatitis, unspecified cause: Secondary | ICD-10-CM | POA: Diagnosis not present

## 2014-07-29 ENCOUNTER — Telehealth: Payer: Self-pay

## 2014-07-29 ENCOUNTER — Encounter: Payer: Self-pay | Admitting: Family Medicine

## 2014-07-29 NOTE — Telephone Encounter (Signed)
Phone number in system is no good. Sent a letter requesting pt to call office

## 2014-07-29 NOTE — Telephone Encounter (Signed)
Can you please call pt and set up follow up visit. Pt has not been seen since 06/2013

## 2014-07-31 ENCOUNTER — Ambulatory Visit: Payer: Medicare Other | Admitting: Family Medicine

## 2014-08-27 ENCOUNTER — Encounter: Payer: Self-pay | Admitting: Family Medicine

## 2014-08-27 ENCOUNTER — Ambulatory Visit (INDEPENDENT_AMBULATORY_CARE_PROVIDER_SITE_OTHER): Payer: Medicare Other | Admitting: Family Medicine

## 2014-08-27 VITALS — BP 132/84 | HR 78 | Temp 97.4°F | Wt 189.0 lb

## 2014-08-27 DIAGNOSIS — E038 Other specified hypothyroidism: Secondary | ICD-10-CM | POA: Diagnosis not present

## 2014-08-27 DIAGNOSIS — I10 Essential (primary) hypertension: Secondary | ICD-10-CM | POA: Diagnosis not present

## 2014-08-27 DIAGNOSIS — E119 Type 2 diabetes mellitus without complications: Secondary | ICD-10-CM | POA: Diagnosis not present

## 2014-08-27 DIAGNOSIS — E785 Hyperlipidemia, unspecified: Secondary | ICD-10-CM

## 2014-08-27 DIAGNOSIS — F418 Other specified anxiety disorders: Secondary | ICD-10-CM

## 2014-08-27 LAB — HEPATIC FUNCTION PANEL
ALBUMIN: 4 g/dL (ref 3.5–5.2)
ALT: 29 U/L (ref 0–35)
AST: 24 U/L (ref 0–37)
Alkaline Phosphatase: 79 U/L (ref 39–117)
Bilirubin, Direct: 0 mg/dL (ref 0.0–0.3)
TOTAL PROTEIN: 7 g/dL (ref 6.0–8.3)
Total Bilirubin: 0.6 mg/dL (ref 0.2–1.2)

## 2014-08-27 LAB — BASIC METABOLIC PANEL
BUN: 18 mg/dL (ref 6–23)
CALCIUM: 9.7 mg/dL (ref 8.4–10.5)
CO2: 26 mEq/L (ref 19–32)
CREATININE: 0.9 mg/dL (ref 0.4–1.2)
Chloride: 101 mEq/L (ref 96–112)
GFR: 70.57 mL/min (ref >60.00)
GLUCOSE: 124 mg/dL — AB (ref 70–99)
Potassium: 4.4 mEq/L (ref 3.5–5.1)
Sodium: 135 mEq/L (ref 135–145)

## 2014-08-27 LAB — LIPID PANEL
CHOL/HDL RATIO: 5
Cholesterol: 249 mg/dL — ABNORMAL HIGH (ref 0–200)
HDL: 46.3 mg/dL (ref >39.00)
NONHDL: 202.7
TRIGLYCERIDES: 283 mg/dL — AB (ref 0.0–149.0)
VLDL: 56.6 mg/dL — ABNORMAL HIGH (ref 0.0–40.0)

## 2014-08-27 LAB — HEMOGLOBIN A1C: Hgb A1c MFr Bld: 6.8 % — ABNORMAL HIGH (ref 4.6–6.5)

## 2014-08-27 LAB — LDL CHOLESTEROL, DIRECT: LDL DIRECT: 170.4 mg/dL

## 2014-08-27 LAB — TSH: TSH: 1.51 u[IU]/mL (ref 0.35–4.50)

## 2014-08-27 NOTE — Progress Notes (Signed)
   Subjective:    Patient ID: Yesenia Wilson, female    DOB: 26-Nov-1955, 58 y.o.   MRN: [id number]  HPI Patient here for medical follow-up. [place] She has not had regular medical care during the past year. She never established with primary care provider while there. She's had very stressful situation recently. Her son had a stroke at an early age. She is now taking care of her 55 year old grandson who has multiple medical problems including autism. He frequently wanders at night and she is getting very little sleep and feels exhausted. She's not been monitoring her medical conditions but states she is compliant with all medications.  She has chronic medical problems including hypothyroidism, type 2 diabetes, obesity, history of recurrent depression, hyperlipidemia, hypertension, asthma. She refuses flu vaccine. She's not getting any exercise. No chest pains. No polyuria or polydipsia. She does take Cymbalta for depression.  Past Medical History  Diagnosis Date  . Hypertension   . Lupus   . Fibromyalgia   . Asthma   . Diabetes mellitus   . Hyperlipidemia   . Depression   . Thyroid disease   . Sleep apnea    Past Surgical History  Procedure Laterality Date  . Appendectomy    . Cholecystectomy    . Cesarean section    . Bladder tack    . Septoplasty      reports that she has never smoked. She has never used smokeless tobacco. She reports that she drinks alcohol. She reports that she does not use illicit drugs. family history is not on file. She was adopted. Allergies  Allergen Reactions  . Codeine   . Oxycodone-Acetaminophen     REACTION: GI upset  . Propoxyphene N-Acetaminophen     REACTION: nausea, rapid heartrate      Review of Systems  Constitutional: Positive for fatigue.  Eyes: Negative for visual disturbance.  Respiratory: Negative for cough, chest tightness, shortness of breath and wheezing.   Cardiovascular: Negative for  chest pain, palpitations and leg swelling.  Endocrine: Negative for polydipsia and polyuria.  Genitourinary: Negative for dysuria.  Neurological: Negative for dizziness, seizures, syncope, weakness, light-headedness and headaches.  Psychiatric/Behavioral: Positive for sleep disturbance and dysphoric mood.       Objective:   Physical Exam  Constitutional: She appears well-developed and well-nourished.  Neck: Neck supple. No thyromegaly present.  Cardiovascular: Normal rate and regular rhythm.  Exam reveals no gallop.   Pulmonary/Chest: Effort normal and breath sounds normal. No respiratory distress. She has no wheezes. She has no rales.  Musculoskeletal: She exhibits no edema.  Lymphadenopathy:    She has no cervical adenopathy.  Psychiatric: She has a normal mood and affect.          Assessment & Plan:  #1 situational stressors/anxiety. We strongly advised counseling and names given. Continue Cymbalta. #2 type 2 diabetes. History of good control. Recheck A1c. Continue metformin.  She refuses flu vaccine #3 hypertension stable. Continue current medications. Check basic metabolic panel #4 hypothyroidism check TSH  #5 hyperlipidemia. Repeat lipid and hepatic panel.

## 2014-08-27 NOTE — Patient Instructions (Signed)
Set up counseling, as discussed.

## 2014-08-27 NOTE — Progress Notes (Signed)
Pre visit review using our clinic review tool, if applicable. No additional management support is needed unless otherwise documented below in the visit note. 

## 2014-08-28 ENCOUNTER — Telehealth: Payer: Self-pay | Admitting: Family Medicine

## 2014-08-28 ENCOUNTER — Other Ambulatory Visit: Payer: Self-pay | Admitting: Family Medicine

## 2014-08-28 ENCOUNTER — Telehealth: Payer: Self-pay

## 2014-08-28 DIAGNOSIS — E785 Hyperlipidemia, unspecified: Secondary | ICD-10-CM

## 2014-08-28 MED ORDER — ATORVASTATIN CALCIUM 20 MG PO TABS: 20.0000 mg | ORAL_TABLET | Freq: Every day | ORAL | Status: DC

## 2014-08-28 NOTE — Telephone Encounter (Signed)
emmi mailed  °

## 2014-08-28 NOTE — Telephone Encounter (Signed)
Pt called about medications.

## 2014-09-01 ENCOUNTER — Ambulatory Visit (INDEPENDENT_AMBULATORY_CARE_PROVIDER_SITE_OTHER): Payer: Medicare Other | Admitting: Licensed Clinical Social Worker

## 2014-09-01 DIAGNOSIS — F4323 Adjustment disorder with mixed anxiety and depressed mood: Secondary | ICD-10-CM | POA: Diagnosis not present

## 2014-09-15 ENCOUNTER — Telehealth: Payer: Self-pay | Admitting: Family Medicine

## 2014-09-15 NOTE — Telephone Encounter (Signed)
Patient needs a re-fill on ALPRAZolam (XANAX) 0.5 MG tablet. She said she is taking the xanax 2 times per day and she isn't able to sleep.   She also states irbesartan-hydrochlorothiazide (AVALIDE) 300-12.5 MG per tablet is expensive and would like to know if Dr. Elease Hashimoto can send in a cheaper alternative.   [facility] in [place].

## 2014-09-15 NOTE — Telephone Encounter (Signed)
She should not be taking Xanax regularly.  Has she tried melatonin for sleep? Regarding her BP medicine, since Avalide is generic I have no way to know which other generics might be less expensive by her plan so she will need to check with them.

## 2014-09-15 NOTE — Telephone Encounter (Signed)
Last visit 08/27/14 Last refill 06/27/13 #30 0 refill

## 2014-09-16 NOTE — Telephone Encounter (Signed)
Pt stated she has not tried melatonin because someone told her that if she takes it over a long period of time then her body would stop producing it.  She said she will call her insurance about the BP medicines that are covered

## 2014-09-30 ENCOUNTER — Ambulatory Visit (INDEPENDENT_AMBULATORY_CARE_PROVIDER_SITE_OTHER): Payer: Medicare Other | Admitting: Licensed Clinical Social Worker

## 2014-09-30 DIAGNOSIS — F4323 Adjustment disorder with mixed anxiety and depressed mood: Secondary | ICD-10-CM

## 2014-10-07 ENCOUNTER — Ambulatory Visit (INDEPENDENT_AMBULATORY_CARE_PROVIDER_SITE_OTHER): Payer: Medicare Other | Admitting: Family Medicine

## 2014-10-07 ENCOUNTER — Encounter: Payer: Self-pay | Admitting: Family Medicine

## 2014-10-07 VITALS — BP 128/80 | HR 90 | Temp 98.2°F | Wt 188.0 lb

## 2014-10-07 DIAGNOSIS — R059 Cough, unspecified: Secondary | ICD-10-CM

## 2014-10-07 DIAGNOSIS — E785 Hyperlipidemia, unspecified: Secondary | ICD-10-CM

## 2014-10-07 DIAGNOSIS — R05 Cough: Secondary | ICD-10-CM

## 2014-10-07 DIAGNOSIS — I10 Essential (primary) hypertension: Secondary | ICD-10-CM | POA: Diagnosis not present

## 2014-10-07 LAB — HEPATIC FUNCTION PANEL
ALT: 29 U/L (ref 0–35)
AST: 22 U/L (ref 0–37)
Albumin: 4.3 g/dL (ref 3.5–5.2)
Alkaline Phosphatase: 111 U/L (ref 39–117)
Bilirubin, Direct: 0.2 mg/dL (ref 0.0–0.3)
Total Bilirubin: 0.5 mg/dL (ref 0.2–1.2)
Total Protein: 7.9 g/dL (ref 6.0–8.3)

## 2014-10-07 LAB — LIPID PANEL
CHOL/HDL RATIO: 4
CHOLESTEROL: 185 mg/dL (ref 0–200)
HDL: 47.3 mg/dL (ref >39.00)
LDL Cholesterol: 109 mg/dL — ABNORMAL HIGH (ref 0–99)
NonHDL: 137.7
TRIGLYCERIDES: 144 mg/dL (ref 0.0–149.0)
VLDL: 28.8 mg/dL (ref 0.0–40.0)

## 2014-10-07 MED ORDER — CEFUROXIME AXETIL 250 MG PO TABS: 250.0000 mg | ORAL_TABLET | Freq: Two times a day (BID) | ORAL | Status: DC

## 2014-10-07 NOTE — Progress Notes (Signed)
   Subjective:    Patient ID: Yesenia Wilson, female    DOB: June 21, 1956, 59 y.o.   MRN: [id number]  HPI Seen today for the following issues  New acute issue of upper respiratory infection. Onset last Friday of sore throat. She then developed nasal congestion, cough, malaise, laryngitis, intermittent chills. Temperature not taken but question of fever over the weekend. Cough productive of thick yellow to green sputum. She does have history of asthma and states she's had pneumonia a few times in the past. Nonsmoker. Decreased appetite but no nausea or vomiting.  Hyperlipidemia. We recently switched her from Mevacor to atorvastatin and she is tolerating without side effects.  Type 2 diabetes. Recent A1c 6.8% and stable.  Past Medical History  Diagnosis Date  . Hypertension   . Lupus   . Fibromyalgia   . Asthma   . Diabetes mellitus   . Hyperlipidemia   . Depression   . Thyroid disease   . Sleep apnea    Past Surgical History  Procedure Laterality Date  . Appendectomy    . Cholecystectomy    . Cesarean section    . Bladder tack    . Septoplasty      reports that she has never smoked. She has never used smokeless tobacco. She reports that she drinks alcohol. She reports that she does not use illicit drugs. family history is not on file. She was adopted. Allergies  Allergen Reactions  . Codeine   . Oxycodone-Acetaminophen     REACTION: GI upset  . Propoxyphene N-Acetaminophen     REACTION: nausea, rapid heartrate      Review of Systems  Constitutional: Positive for chills and fatigue.  HENT: Positive for congestion, sore throat and voice change. Negative for trouble swallowing.   Eyes: Negative for visual disturbance.  Respiratory: Positive for cough. Negative for chest tightness, shortness of breath and wheezing.   Cardiovascular: Negative for chest pain, palpitations and leg swelling.  Neurological: Negative for dizziness, seizures, syncope, weakness, light-headedness  and headaches.       Objective:   Physical Exam  Constitutional: She appears well-developed and well-nourished. No distress.  HENT:  Right Ear: External ear normal.  Left Ear: External ear normal.  She does have some erythema posterior pharynx but no exudate  Neck: Neck supple.  Cardiovascular: Normal rate and regular rhythm.   Pulmonary/Chest: Effort normal and breath sounds normal. No respiratory distress. She has no wheezes. She has no rales.  Lymphadenopathy:    She has no cervical adenopathy.          Assessment & Plan:  #1 URI with cough. Probably viral. Recommend observe for now. Start Ceftin 200 mg twice a day if she develops any fever or worsening symptoms. She will try over-the-counter cough medications as needed #2 hypertension stable at goal #3 dyslipidemia. Repeat lipid and hepatic panel today with recent change of medications as above  #4 type 2 diabetes well controlled by recent A1c

## 2014-10-07 NOTE — Progress Notes (Signed)
Pre visit review using our clinic review tool, if applicable. No additional management support is needed unless otherwise documented below in the visit note. 

## 2014-10-13 ENCOUNTER — Ambulatory Visit: Payer: Medicare Other | Admitting: Licensed Clinical Social Worker

## 2014-11-03 ENCOUNTER — Other Ambulatory Visit: Payer: Medicare Other

## 2014-11-09 DIAGNOSIS — M5432 Sciatica, left side: Secondary | ICD-10-CM | POA: Diagnosis not present

## 2014-11-09 DIAGNOSIS — M5431 Sciatica, right side: Secondary | ICD-10-CM | POA: Diagnosis not present

## 2014-11-09 DIAGNOSIS — M9903 Segmental and somatic dysfunction of lumbar region: Secondary | ICD-10-CM | POA: Diagnosis not present

## 2014-11-10 DIAGNOSIS — M5432 Sciatica, left side: Secondary | ICD-10-CM | POA: Diagnosis not present

## 2014-11-10 DIAGNOSIS — M5431 Sciatica, right side: Secondary | ICD-10-CM | POA: Diagnosis not present

## 2014-11-10 DIAGNOSIS — M9903 Segmental and somatic dysfunction of lumbar region: Secondary | ICD-10-CM | POA: Diagnosis not present

## 2014-11-11 DIAGNOSIS — M5431 Sciatica, right side: Secondary | ICD-10-CM | POA: Diagnosis not present

## 2014-11-11 DIAGNOSIS — M9903 Segmental and somatic dysfunction of lumbar region: Secondary | ICD-10-CM | POA: Diagnosis not present

## 2014-11-11 DIAGNOSIS — M5432 Sciatica, left side: Secondary | ICD-10-CM | POA: Diagnosis not present

## 2014-11-19 DIAGNOSIS — M5432 Sciatica, left side: Secondary | ICD-10-CM | POA: Diagnosis not present

## 2014-11-19 DIAGNOSIS — M9903 Segmental and somatic dysfunction of lumbar region: Secondary | ICD-10-CM | POA: Diagnosis not present

## 2014-11-19 DIAGNOSIS — M5431 Sciatica, right side: Secondary | ICD-10-CM | POA: Diagnosis not present

## 2014-11-25 DIAGNOSIS — M9903 Segmental and somatic dysfunction of lumbar region: Secondary | ICD-10-CM | POA: Diagnosis not present

## 2014-11-25 DIAGNOSIS — M5432 Sciatica, left side: Secondary | ICD-10-CM | POA: Diagnosis not present

## 2014-11-25 DIAGNOSIS — M5431 Sciatica, right side: Secondary | ICD-10-CM | POA: Diagnosis not present

## 2014-11-26 DIAGNOSIS — M5432 Sciatica, left side: Secondary | ICD-10-CM | POA: Diagnosis not present

## 2014-11-26 DIAGNOSIS — M5431 Sciatica, right side: Secondary | ICD-10-CM | POA: Diagnosis not present

## 2014-11-26 DIAGNOSIS — M9903 Segmental and somatic dysfunction of lumbar region: Secondary | ICD-10-CM | POA: Diagnosis not present

## 2014-12-18 ENCOUNTER — Telehealth: Payer: Self-pay | Admitting: Family Medicine

## 2014-12-18 DIAGNOSIS — H539 Unspecified visual disturbance: Secondary | ICD-10-CM | POA: Diagnosis not present

## 2014-12-18 DIAGNOSIS — S0990XA Unspecified injury of head, initial encounter: Secondary | ICD-10-CM | POA: Diagnosis not present

## 2014-12-18 DIAGNOSIS — M62838 Other muscle spasm: Secondary | ICD-10-CM | POA: Diagnosis not present

## 2014-12-18 DIAGNOSIS — Z885 Allergy status to narcotic agent status: Secondary | ICD-10-CM | POA: Diagnosis not present

## 2014-12-18 DIAGNOSIS — R531 Weakness: Secondary | ICD-10-CM | POA: Diagnosis not present

## 2014-12-18 DIAGNOSIS — R51 Headache: Secondary | ICD-10-CM | POA: Diagnosis not present

## 2014-12-18 DIAGNOSIS — Z79899 Other long term (current) drug therapy: Secondary | ICD-10-CM | POA: Diagnosis not present

## 2014-12-18 DIAGNOSIS — E079 Disorder of thyroid, unspecified: Secondary | ICD-10-CM | POA: Diagnosis not present

## 2014-12-18 DIAGNOSIS — J45909 Unspecified asthma, uncomplicated: Secondary | ICD-10-CM | POA: Diagnosis not present

## 2014-12-18 DIAGNOSIS — M329 Systemic lupus erythematosus, unspecified: Secondary | ICD-10-CM | POA: Diagnosis not present

## 2014-12-18 DIAGNOSIS — M5410 Radiculopathy, site unspecified: Secondary | ICD-10-CM | POA: Diagnosis not present

## 2014-12-18 DIAGNOSIS — E119 Type 2 diabetes mellitus without complications: Secondary | ICD-10-CM | POA: Diagnosis not present

## 2014-12-18 DIAGNOSIS — I10 Essential (primary) hypertension: Secondary | ICD-10-CM | POA: Diagnosis not present

## 2014-12-18 DIAGNOSIS — W19XXXA Unspecified fall, initial encounter: Secondary | ICD-10-CM | POA: Diagnosis not present

## 2014-12-18 DIAGNOSIS — M545 Low back pain: Secondary | ICD-10-CM | POA: Diagnosis not present

## 2014-12-18 NOTE — Telephone Encounter (Signed)
Patient Name: Yesenia Wilson  DOB: March 28, 1956    Initial Comment Caller states she fell, left arm hurting and numb with tingling. Happened Wednesday. Hand also hurting and not working normally, swelling. Also hit head, has had headache since. Teeth hurt, neck, back. Legs hurt also.    Nurse Assessment      Guidelines    Guideline Title Affirmed Question Affirmed Notes       Final Disposition User   FINAL ATTEMPT MADE - message left Mallie Mussel, RN, Alveta Heimlich

## 2014-12-18 NOTE — Telephone Encounter (Signed)
Left a message for pt advising that I was following up to see if pt was seeking medical attention at the [facility] or ED.

## 2014-12-18 NOTE — Telephone Encounter (Signed)
Pt cb and is going to [employer] daughter is a Marine scientist there.

## 2014-12-18 NOTE — Telephone Encounter (Signed)
Noted  

## 2014-12-18 NOTE — Telephone Encounter (Signed)
Patient Name: JANAA ACERO DOB: [birth year] Initial Comment caller states her left arm is numb and has pain in her hand - her - hurts in her spine and neck. Also c/ o a headache Nurse Assessment Nurse: Ronnald Ramp, RN, Miranda Date/Time (Eastern Time): 12/18/2014 12:22:13 PM Confirm and document reason for call. If symptomatic, describe symptoms. ---Caller states she is having neck, back, and head pain. Caller states she fell through a wall last night. Has the patient traveled out of the country within the last 30 days? ---Not Applicable Does the patient require triage? ---Yes Related visit to physician within the last 2 weeks? ---No Does the PT have any chronic conditions? (i.e. diabetes, asthma, etc.) ---Yes List chronic conditions. ---Diabetes, HTN, Guidelines Guideline Title Affirmed Question Affirmed Notes Arm Injury Sounds like a serious injury to the triager Final Disposition User Go to ED Now Ronnald Ramp, RN, Miranda Comments Since pt is having head, neck, and back pain after falling, told pt to go to the ED.

## 2014-12-30 DIAGNOSIS — M5431 Sciatica, right side: Secondary | ICD-10-CM | POA: Diagnosis not present

## 2014-12-30 DIAGNOSIS — M5432 Sciatica, left side: Secondary | ICD-10-CM | POA: Diagnosis not present

## 2014-12-30 DIAGNOSIS — M9903 Segmental and somatic dysfunction of lumbar region: Secondary | ICD-10-CM | POA: Diagnosis not present

## 2014-12-31 DIAGNOSIS — M5432 Sciatica, left side: Secondary | ICD-10-CM | POA: Diagnosis not present

## 2014-12-31 DIAGNOSIS — M5431 Sciatica, right side: Secondary | ICD-10-CM | POA: Diagnosis not present

## 2014-12-31 DIAGNOSIS — M9903 Segmental and somatic dysfunction of lumbar region: Secondary | ICD-10-CM | POA: Diagnosis not present

## 2015-01-18 ENCOUNTER — Ambulatory Visit: Payer: Medicare Other | Admitting: Family Medicine

## 2015-01-19 ENCOUNTER — Ambulatory Visit: Payer: Medicare Other | Admitting: Family Medicine

## 2015-01-19 DIAGNOSIS — Z0289 Encounter for other administrative examinations: Secondary | ICD-10-CM

## 2015-01-22 ENCOUNTER — Encounter: Payer: Self-pay | Admitting: Family Medicine

## 2015-01-22 ENCOUNTER — Ambulatory Visit (INDEPENDENT_AMBULATORY_CARE_PROVIDER_SITE_OTHER): Payer: Medicare Other | Admitting: Family Medicine

## 2015-01-22 VITALS — BP 130/74 | HR 79 | Temp 97.7°F | Wt 193.0 lb

## 2015-01-22 DIAGNOSIS — F339 Major depressive disorder, recurrent, unspecified: Secondary | ICD-10-CM

## 2015-01-22 DIAGNOSIS — M609 Myositis, unspecified: Secondary | ICD-10-CM

## 2015-01-22 DIAGNOSIS — M791 Myalgia: Secondary | ICD-10-CM | POA: Diagnosis not present

## 2015-01-22 DIAGNOSIS — E038 Other specified hypothyroidism: Secondary | ICD-10-CM | POA: Diagnosis not present

## 2015-01-22 DIAGNOSIS — I10 Essential (primary) hypertension: Secondary | ICD-10-CM | POA: Diagnosis not present

## 2015-01-22 DIAGNOSIS — E785 Hyperlipidemia, unspecified: Secondary | ICD-10-CM

## 2015-01-22 DIAGNOSIS — E119 Type 2 diabetes mellitus without complications: Secondary | ICD-10-CM | POA: Diagnosis not present

## 2015-01-22 DIAGNOSIS — R296 Repeated falls: Secondary | ICD-10-CM

## 2015-01-22 DIAGNOSIS — IMO0001 Reserved for inherently not codable concepts without codable children: Secondary | ICD-10-CM

## 2015-01-22 LAB — CK: CK TOTAL: 48 U/L (ref 7–177)

## 2015-01-22 LAB — CBC WITH DIFFERENTIAL/PLATELET
BASOS ABS: 0 10*3/uL (ref 0.0–0.1)
Basophils Relative: 0.4 % (ref 0.0–3.0)
Eosinophils Absolute: 0.6 10*3/uL (ref 0.0–0.7)
Eosinophils Relative: 5 % (ref 0.0–5.0)
HEMATOCRIT: 41.1 % (ref 36.0–46.0)
HEMOGLOBIN: 13.8 g/dL (ref 12.0–15.0)
LYMPHS ABS: 3.4 10*3/uL (ref 0.7–4.0)
Lymphocytes Relative: 29.2 % (ref 12.0–46.0)
MCHC: 33.6 g/dL (ref 30.0–36.0)
MCV: 85.2 fl (ref 78.0–100.0)
Monocytes Absolute: 0.6 10*3/uL (ref 0.1–1.0)
Monocytes Relative: 4.9 % (ref 3.0–12.0)
Neutro Abs: 7 10*3/uL (ref 1.4–7.7)
Neutrophils Relative %: 60.5 % (ref 43.0–77.0)
Platelets: 305 10*3/uL (ref 150.0–400.0)
RBC: 4.82 Mil/uL (ref 3.87–5.11)
RDW: 14.3 % (ref 11.5–15.5)
WBC: 11.6 10*3/uL — ABNORMAL HIGH (ref 4.0–10.5)

## 2015-01-22 LAB — BASIC METABOLIC PANEL
BUN: 26 mg/dL — ABNORMAL HIGH (ref 6–23)
CALCIUM: 10.9 mg/dL — AB (ref 8.4–10.5)
CO2: 29 mEq/L (ref 19–32)
Chloride: 100 mEq/L (ref 96–112)
Creatinine, Ser: 1.11 mg/dL (ref 0.40–1.20)
GFR: 53.2 mL/min — ABNORMAL LOW (ref >60.00)
Glucose, Bld: 146 mg/dL — ABNORMAL HIGH (ref 70–99)
POTASSIUM: 4.7 meq/L (ref 3.5–5.1)
SODIUM: 137 meq/L (ref 135–145)

## 2015-01-22 LAB — TSH: TSH: 0.69 u[IU]/mL (ref 0.35–4.50)

## 2015-01-22 LAB — HEMOGLOBIN A1C: HEMOGLOBIN A1C: 7.3 % — AB (ref 4.6–6.5)

## 2015-01-22 MED ORDER — ARIPIPRAZOLE 2 MG PO TABS: 2.0000 mg | ORAL_TABLET | Freq: Every day | ORAL | Status: DC

## 2015-01-22 NOTE — Progress Notes (Signed)
Subjective:    Patient ID: Yesenia Wilson, female    DOB: 1956-02-01, 59 y.o.   MRN: [id number]  HPI Patient is here with multiple complaints. History is very challenging and she tends to ramble and change topics frequently.  Frequent falls. She states she's had 3 falls over the past few months. Most recent 3 weeks ago. She fell in her yard. No syncope. Denies any dizziness. She was seen over in [place] and had CT head reportedly normal. Question of concussion. No persistent symptoms. She denies any vertigo or any focal weakness.  History of depression. Increased stress issues. Recent loss of close friend and stress of taking care of grandson who has mental illness issues. Patient on Cymbalta 60 mg daily but feels her depression is worse. No suicidal ideation. Frequent sleep disturbance. No alcohol use. No appetite change.  Hypothyroidism. Patient on regular replacement. Compliant with medication. Type 2 diabetes which has been fairly well controlled. Remains on metformin. Not monitoring blood sugars regularly. No polyuria or polydipsia.  Diffuse myalgias. Present for several months. Patient does take Lipitor. Has never taken herself off to see if this makes a difference. She does have history of reported fibromyalgia.  Past Medical History  Diagnosis Date  . Hypertension   . Lupus   . Fibromyalgia   . Asthma   . Diabetes mellitus   . Hyperlipidemia   . Depression   . Thyroid disease   . Sleep apnea    Past Surgical History  Procedure Laterality Date  . Appendectomy    . Cholecystectomy    . Cesarean section    . Bladder tack    . Septoplasty      reports that she has never smoked. She has never used smokeless tobacco. She reports that she drinks alcohol. She reports that she does not use illicit drugs. family history is not on file. She was adopted. Allergies  Allergen Reactions  . Codeine   . Oxycodone-Acetaminophen     REACTION: GI upset  . Propoxyphene  N-Acetaminophen     REACTION: nausea, rapid heartrate      Review of Systems  Constitutional: Positive for fatigue.  Eyes: Negative for visual disturbance.  Respiratory: Negative for cough, chest tightness, shortness of breath and wheezing.   Cardiovascular: Negative for chest pain, palpitations and leg swelling.  Gastrointestinal: Negative for nausea, vomiting and abdominal pain.  Endocrine: Negative for polydipsia and polyuria.  Genitourinary: Negative for dysuria.  Musculoskeletal: Positive for myalgias and arthralgias.  Neurological: Negative for dizziness, seizures, syncope, weakness, light-headedness and headaches.  Psychiatric/Behavioral: Positive for sleep disturbance and dysphoric mood. Negative for suicidal ideas, confusion and agitation. The patient is nervous/anxious.        Objective:   Physical Exam  Constitutional: She is oriented to person, place, and time. She appears well-developed and well-nourished.  HENT:  Mouth/Throat: Oropharynx is clear and moist.  Neck: Neck supple. No thyromegaly present.  Cardiovascular: Normal rate and regular rhythm.   Pulmonary/Chest: Effort normal and breath sounds normal. No respiratory distress. She has no wheezes. She has no rales.  Musculoskeletal: She exhibits no edema.  Neurological: She is alert and oriented to person, place, and time.  Deep tendon reflexes are 2+ and symmetric throughout upper and lower extremities. No focal strength deficits. Romberg normal. Gait is normal. No difficulties with transfers. No ataxia          Assessment & Plan:  #1 frequent falls. Her balance seems excellent today and she has no focal  strength deficits or other neurologic deficits. Discontinue Xanax-does not take this regularly #2 recurrent depression. PH Q9 score of 18 today. She's had extensive counseling in the past and declines at this time. Continue Cymbalta. Add Abilify 2 mg daily at bedtime and reassess 3 weeks #3 hypothyroidism.  Recheck TSH #4 type 2 diabetes. History of good control. Recheck A1c #5 diffuse myalgias. Reported history of fibromyalgia. Check creatinine kinase. Hold Lipitor for 3 weeks until follow-up and reassess

## 2015-01-22 NOTE — Patient Instructions (Addendum)
Try leaving off atorvastatin to see if this makes a difference with your muscle aches-we will reassess at follow-up in 3 weeks Leave off Xanax We will call you regarding labs today Start Abilify 2 mg one at night Continue with the Cymbalta for now.

## 2015-01-22 NOTE — Progress Notes (Signed)
Pre visit review using our clinic review tool, if applicable. No additional management support is needed unless otherwise documented below in the visit note. 

## 2015-01-25 ENCOUNTER — Other Ambulatory Visit: Payer: Self-pay | Admitting: Family Medicine

## 2015-01-25 ENCOUNTER — Telehealth: Payer: Self-pay

## 2015-01-25 DIAGNOSIS — I10 Essential (primary) hypertension: Secondary | ICD-10-CM

## 2015-01-25 DIAGNOSIS — E119 Type 2 diabetes mellitus without complications: Secondary | ICD-10-CM

## 2015-01-25 NOTE — Telephone Encounter (Signed)
Pt informed

## 2015-01-25 NOTE — Telephone Encounter (Signed)
Pt states the Ablifiy is not helping her sleep at all.

## 2015-01-25 NOTE — Telephone Encounter (Signed)
Too soon to give up yet.  Have her try 2 more nights at 2 mg and if no improvement by then increase to 2 tablets (4 mg) at night.

## 2015-01-26 ENCOUNTER — Encounter (HOSPITAL_COMMUNITY): Payer: Self-pay | Admitting: Physical Medicine and Rehabilitation

## 2015-01-26 ENCOUNTER — Other Ambulatory Visit: Payer: Self-pay

## 2015-01-26 ENCOUNTER — Emergency Department (HOSPITAL_COMMUNITY): Payer: Medicare Other

## 2015-01-26 ENCOUNTER — Telehealth: Payer: Self-pay | Admitting: Family Medicine

## 2015-01-26 ENCOUNTER — Emergency Department (HOSPITAL_COMMUNITY)
Admission: EM | Admit: 2015-01-26 | Discharge: 2015-01-26 | Disposition: A | Discharge status: Home / Self Care | Payer: Medicare Other | Attending: Emergency Medicine | Admitting: Emergency Medicine

## 2015-01-26 DIAGNOSIS — E079 Disorder of thyroid, unspecified: Secondary | ICD-10-CM | POA: Diagnosis not present

## 2015-01-26 DIAGNOSIS — Z8669 Personal history of other diseases of the nervous system and sense organs: Secondary | ICD-10-CM | POA: Insufficient documentation

## 2015-01-26 DIAGNOSIS — R51 Headache: Secondary | ICD-10-CM | POA: Diagnosis not present

## 2015-01-26 DIAGNOSIS — E785 Hyperlipidemia, unspecified: Secondary | ICD-10-CM | POA: Insufficient documentation

## 2015-01-26 DIAGNOSIS — R531 Weakness: Secondary | ICD-10-CM | POA: Diagnosis not present

## 2015-01-26 DIAGNOSIS — E119 Type 2 diabetes mellitus without complications: Secondary | ICD-10-CM | POA: Diagnosis not present

## 2015-01-26 DIAGNOSIS — Z79899 Other long term (current) drug therapy: Secondary | ICD-10-CM | POA: Insufficient documentation

## 2015-01-26 DIAGNOSIS — R0602 Shortness of breath: Secondary | ICD-10-CM | POA: Diagnosis not present

## 2015-01-26 DIAGNOSIS — Z7982 Long term (current) use of aspirin: Secondary | ICD-10-CM | POA: Diagnosis not present

## 2015-01-26 DIAGNOSIS — F329 Major depressive disorder, single episode, unspecified: Secondary | ICD-10-CM | POA: Insufficient documentation

## 2015-01-26 DIAGNOSIS — M797 Fibromyalgia: Secondary | ICD-10-CM | POA: Diagnosis not present

## 2015-01-26 DIAGNOSIS — R202 Paresthesia of skin: Secondary | ICD-10-CM | POA: Insufficient documentation

## 2015-01-26 DIAGNOSIS — I10 Essential (primary) hypertension: Secondary | ICD-10-CM | POA: Insufficient documentation

## 2015-01-26 DIAGNOSIS — R519 Headache, unspecified: Secondary | ICD-10-CM

## 2015-01-26 DIAGNOSIS — J45909 Unspecified asthma, uncomplicated: Secondary | ICD-10-CM | POA: Diagnosis not present

## 2015-01-26 DIAGNOSIS — R200 Anesthesia of skin: Secondary | ICD-10-CM | POA: Diagnosis not present

## 2015-01-26 DIAGNOSIS — M549 Dorsalgia, unspecified: Secondary | ICD-10-CM | POA: Diagnosis not present

## 2015-01-26 DIAGNOSIS — R209 Unspecified disturbances of skin sensation: Secondary | ICD-10-CM | POA: Diagnosis not present

## 2015-01-26 LAB — CBC WITH DIFFERENTIAL/PLATELET
Basophils Absolute: 0 10*3/uL (ref 0.0–0.1)
Basophils Relative: 0 % (ref 0–1)
Eosinophils Absolute: 0.7 10*3/uL (ref 0.0–0.7)
Eosinophils Relative: 6 % — ABNORMAL HIGH (ref 0–5)
HCT: 41.6 % (ref 36.0–46.0)
HEMOGLOBIN: 13.8 g/dL (ref 12.0–15.0)
LYMPHS ABS: 3.3 10*3/uL (ref 0.7–4.0)
LYMPHS PCT: 28 % (ref 12–46)
MCH: 28.7 pg (ref 26.0–34.0)
MCHC: 33.2 g/dL (ref 30.0–36.0)
MCV: 86.5 fL (ref 78.0–100.0)
Monocytes Absolute: 0.6 10*3/uL (ref 0.1–1.0)
Monocytes Relative: 5 % (ref 3–12)
NEUTROS PCT: 61 % (ref 43–77)
Neutro Abs: 7.2 10*3/uL (ref 1.7–7.7)
PLATELETS: 290 10*3/uL (ref 150–400)
RBC: 4.81 MIL/uL (ref 3.87–5.11)
RDW: 13.9 % (ref 11.5–15.5)
WBC: 11.8 10*3/uL — ABNORMAL HIGH (ref 4.0–10.5)

## 2015-01-26 LAB — COMPREHENSIVE METABOLIC PANEL
ALT: 46 U/L (ref 14–54)
AST: 35 U/L (ref 15–41)
Albumin: 4.2 g/dL (ref 3.5–5.0)
Alkaline Phosphatase: 90 U/L (ref 38–126)
Anion gap: 13 (ref 5–15)
BILIRUBIN TOTAL: 0.7 mg/dL (ref 0.3–1.2)
BUN: 17 mg/dL (ref 6–20)
CHLORIDE: 99 mmol/L — AB (ref 101–111)
CO2: 25 mmol/L (ref 22–32)
CREATININE: 0.98 mg/dL (ref 0.44–1.00)
Calcium: 9.9 mg/dL (ref 8.9–10.3)
GFR calc non Af Amer: >60 mL/min (ref >60)
GLUCOSE: 131 mg/dL — AB (ref 70–99)
POTASSIUM: 4.2 mmol/L (ref 3.5–5.1)
Sodium: 137 mmol/L (ref 135–145)
TOTAL PROTEIN: 7.7 g/dL (ref 6.5–8.1)

## 2015-01-26 LAB — I-STAT TROPONIN, ED: TROPONIN I, POC: 0.01 ng/mL (ref 0.00–0.08)

## 2015-01-26 LAB — CBG MONITORING, ED: Glucose-Capillary: 119 mg/dL — ABNORMAL HIGH (ref 70–99)

## 2015-01-26 MED ORDER — ONDANSETRON HCL 4 MG/2ML IJ SOLN
4.0000 mg | Freq: Once | INTRAMUSCULAR | Status: AC
  Administered 2015-01-26: 4 mg via INTRAVENOUS
  Filled 2015-01-26: qty 2

## 2015-01-26 MED ORDER — SODIUM CHLORIDE 0.9 % IV BOLUS (SEPSIS)
1000.0000 mL | Freq: Once | INTRAVENOUS | Status: AC
  Administered 2015-01-26: 1000 mL via INTRAVENOUS

## 2015-01-26 NOTE — Telephone Encounter (Signed)
FYI

## 2015-01-26 NOTE — Telephone Encounter (Signed)
Spoke to patient. Patient states is under a lot of stress. Patient states is currently in the ED parking lot about to check in.

## 2015-01-26 NOTE — ED Notes (Addendum)
Pt presents to department for evaluation of bilateral arm numbness, headache, neck pain and generalized weakness. Also states elevated calcium and hemoglobin A1C level. Pt is alert and oriented x4.

## 2015-01-26 NOTE — Telephone Encounter (Signed)
Patient Name: Yesenia Wilson  DOB: May 17, 1956    Initial Comment caller states she has not slept in 2 nights - both arms are numb and tingling - was in one arm and now the other   Nurse Assessment  Nurse: Leilani Merl, RN, Nira Conn Date/Time ([place] Time): 01/26/2015 8:24:24 AM  Confirm and document reason for call. If symptomatic, describe symptoms. ---caller states she has not slept in 2 nights - both arms are numb and tingling - was in one arm and now the other. She also has a headache and neck pain and her left arm is cold. She has had this for a while but it is getting worse. She also has hand swelling. She started abilify last week to help her rest.  Has the patient traveled out of the country within the last 30 days? ---Not Applicable  Does the patient require triage? ---Yes  Related visit to physician within the last 2 weeks? ---Yes  Does the PT have any chronic conditions? (i.e. diabetes, asthma, etc.) ---Yes  List chronic conditions. ---DM, HTN     Guidelines    Guideline Title Affirmed Question Affirmed Notes  Headache Stiff neck (can't touch chin to chest)    Final Disposition User   Go to ED Now [place], RN, [employer]

## 2015-01-26 NOTE — ED Provider Notes (Signed)
CSN: [id number]     Arrival date & time 01/26/15  1009 History   First MD Initiated Contact with Patient 01/26/15 1147     Chief Complaint  Patient presents with  . Numbness  . Abnormal Lab     (Consider location/radiation/quality/duration/timing/severity/associated sxs/prior Treatment) HPI Comments: Patient is a 59 year old female with past medical history of diabetes, hypertension. She presents for evaluation of multiple complaints. She reports for the past month she has been having headaches and feels off balance. She said she has fallen several times as a result of this. She also reports feeling as if there is something in her throat, generalized weakness, numbness of both arms, shortness of breath, headache. She states that her last doctor's appointment revealed elevated hemoglobin A1c and calcium levels. She is concerned about these. She also reports that she is under a great deal of stress and is having difficulty sleeping. She was recently prescribed Abilify to be used as a sleep aid, however this has not helped.  The history is provided by the patient.    Past Medical History  Diagnosis Date  . Hypertension   . Lupus   . Fibromyalgia   . Asthma   . Diabetes mellitus   . Hyperlipidemia   . Depression   . Thyroid disease   . Sleep apnea    Past Surgical History  Procedure Laterality Date  . Appendectomy    . Cholecystectomy    . Cesarean section    . Bladder tack    . Septoplasty     Family History  Problem Relation Age of Onset  . Adopted: Yes   History  Substance Use Topics  . Smoking status: Never Smoker   . Smokeless tobacco: Never Used  . Alcohol Use: Yes   OB History    No data available     Review of Systems  All other systems reviewed and are negative.     Allergies  Codeine; Oxycodone-acetaminophen; and Propoxyphene n-acetaminophen  Home Medications   Prior to Admission medications   Medication Sig Start Date End Date Taking? Authorizing  Provider  ARIPiprazole (ABILIFY) 2 MG tablet Take 1 tablet (2 mg total) by mouth at bedtime. 01/22/15   Eulas Post, MD  aspirin 81 MG tablet Take 81 mg by mouth daily.      Historical Provider, MD  atorvastatin (LIPITOR) 20 MG tablet Take 1 tablet (20 mg total) by mouth daily. 08/28/14   Eulas Post, MD  DULoxetine (CYMBALTA) 60 MG capsule Take 1 capsule (60 mg total) by mouth daily. 04/08/13   Marletta Lor, MD  esomeprazole (NEXIUM) 40 MG capsule TAKE ONE CAPSULE BY MOUTH IN THE MORNING BEFORE BREAKFAST 11/26/13   Eulas Post, MD  glucose blood test strip OneTouch test strip, use 2 times daily as instructed 02/28/12   Eulas Post, MD  irbesartan-hydrochlorothiazide (AVALIDE) 300-12.5 MG per tablet Take 1 tablet by mouth daily.    Historical Provider, MD  levalbuterol [facility] HFA) 45 MCG/ACT inhaler Inhale 1-2 puffs into the lungs 4 (four) times daily as needed.      Historical Provider, MD  metFORMIN (GLUCOPHAGE) 1000 MG tablet Take 1 tablet (1,000 mg total) by mouth 2 (two) times daily. 06/27/13   Eulas Post, MD  metoprolol tartrate (LOPRESSOR) 25 MG tablet TAKE ONE TABLET BY MOUTH EVERY DAY 08/09/12   Eulas Post, MD  ONE TOUCH LANCETS MISC OneTouch lancets, use 2 times daily as directed 02/28/12   Alinda Sierras  Burchette, MD  SYNTHROID 112 MCG tablet TAKE ONE TABLET BY MOUTH EVERY DAY 02/27/13   Eulas Post, MD  traMADol (ULTRAM) 50 MG tablet Take 1 tablet (50 mg total) by mouth every 6 (six) hours as needed. Patient not taking: Reported on 01/22/2015 01/14/14   Eulas Post, MD   BP 143/65 mmHg  Temp(Src) 98.2 F (36.8 C) (Oral)  Resp 72  SpO2 96% Physical Exam  Constitutional: She is oriented to person, place, and time. She appears well-developed and well-nourished. No distress.  HENT:  Head: Normocephalic and atraumatic.  Mouth/Throat: Oropharynx is clear and moist.  Eyes: EOM are normal. Pupils are equal, round, and reactive to light.  Neck:  Normal range of motion. Neck supple.  Cardiovascular: Normal rate and regular rhythm.  Exam reveals no gallop and no friction rub.   No murmur heard. Pulmonary/Chest: Effort normal and breath sounds normal. No respiratory distress. She has no wheezes.  Abdominal: Soft. Bowel sounds are normal. She exhibits no distension. There is no tenderness.  Musculoskeletal: Normal range of motion.  Lymphadenopathy:    She has no cervical adenopathy.  Neurological: She is alert and oriented to person, place, and time. No cranial nerve deficit. She exhibits normal muscle tone. Coordination normal.  Skin: Skin is warm and dry. She is not diaphoretic.  Nursing note and vitals reviewed.   ED Course  Procedures (including critical care time) Labs Review Labs Reviewed  CBC WITH DIFFERENTIAL/PLATELET - Abnormal; Notable for the following:    WBC 11.8 (*)    Eosinophils Relative 6 (*)    All other components within normal limits  COMPREHENSIVE METABOLIC PANEL  CBG MONITORING, ED  I-STAT TROPOININ, ED    Imaging Review No results found.  ED ECG REPORT   Date: 01/26/2015  Rate: 80  Rhythm: normal sinus rhythm  QRS Axis: normal  Intervals: normal  ST/T Wave abnormalities: normal  Conduction Disutrbances:Incomplete right bundle branch block  Narrative Interpretation:   Old EKG Reviewed: unchanged  I have personally reviewed the EKG tracing and agree with the computerized printout as noted.   MDM   Final diagnoses:  None    Patient is a 59 year old female who presents with multiple complaints, none of which appear emergent. Her workup is unremarkable and I have found no explanation. At this point I feel as though she is appropriate for discharge. She is to follow-up with her primary Dr. if not improving in the next week, and return to the ER if her symptoms significantly worsen or change.    Veryl Speak, MD 01/26/15 [phone]

## 2015-01-26 NOTE — Discharge Instructions (Signed)
General Headache Without Cause A headache is pain or discomfort felt around the head or neck area. The specific cause of a headache may not be found. There are many causes and types of headaches. A few common ones are:  Tension headaches.  Migraine headaches.  Cluster headaches.  Chronic daily headaches. HOME CARE INSTRUCTIONS   Keep all follow-up appointments with your caregiver or any specialist referral.  Only take over-the-counter or prescription medicines for pain or discomfort as directed by your caregiver.  Lie down in a dark, quiet room when you have a headache.  Keep a headache journal to find out what may trigger your migraine headaches. For example, write down:  What you eat and drink.  How much sleep you get.  Any change to your diet or medicines.  Try massage or other relaxation techniques.  Put ice packs or heat on the head and neck. Use these 3 to 4 times per day for 15 to 20 minutes each time, or as needed.  Limit stress.  Sit up straight, and do not tense your muscles.  Quit smoking if you smoke.  Limit alcohol use.  Decrease the amount of caffeine you drink, or stop drinking caffeine.  Eat and sleep on a regular schedule.  Get 7 to 9 hours of sleep, or as recommended by your caregiver.  Keep lights dim if bright lights bother you and make your headaches worse. SEEK MEDICAL CARE IF:   You have problems with the medicines you were prescribed.  Your medicines are not working.  You have a change from the usual headache.  You have nausea or vomiting. SEEK IMMEDIATE MEDICAL CARE IF:   Your headache becomes severe.  You have a fever.  You have a stiff neck.  You have loss of vision.  You have muscular weakness or loss of muscle control.  You start losing your balance or have trouble walking.  You feel faint or pass out.  You have severe symptoms that are different from your first symptoms. MAKE SURE YOU:   Understand these  instructions.  Will watch your condition.  Will get help right away if you are not doing well or get worse. Document Released: 09/04/2005 Document Revised: 11/27/2011 Document Reviewed: 09/20/2011 [facility] Patient Information 2015 [place], [place]. This information is not intended to replace advice given to you by your health care provider. Make sure you discuss any questions you have with your health care provider.  Paresthesia Paresthesia is an abnormal burning or prickling sensation. This sensation is generally felt in the hands, arms, legs, or feet. However, it may occur in any part of the body. It is usually not painful. The feeling may be described as:  Tingling or numbness.  "Pins and needles."  Skin crawling.  Buzzing.  Limbs "falling asleep."  Itching. Most people experience temporary (transient) paresthesia at some time in their lives. CAUSES  Paresthesia may occur when you breathe too quickly (hyperventilation). It can also occur without any apparent cause. Commonly, paresthesia occurs when pressure is placed on a nerve. The feeling quickly goes away once the pressure is removed. For some people, however, paresthesia is a long-lasting (chronic) condition caused by an underlying disorder. The underlying disorder may be:  A traumatic, direct injury to nerves. Examples include a:  Broken (fractured) neck.  Fractured skull.  A disorder affecting the brain and spinal cord (central nervous system). Examples include:  Transverse myelitis.  Encephalitis.  Transient ischemic attack.  Multiple sclerosis.  Stroke.  Tumor or  blood vessel problems, such as an arteriovenous malformation pressing against the brain or spinal cord.  A condition that damages the peripheral nerves (peripheral neuropathy). Peripheral nerves are not part of the brain and spinal cord. These conditions include:  Diabetes.  Peripheral vascular disease.  Nerve entrapment syndromes, such as carpal  tunnel syndrome.  Shingles.  Hypothyroidism.  Vitamin B12 deficiencies.  Alcoholism.  Heavy metal poisoning (lead, arsenic).  Rheumatoid arthritis.  Systemic lupus erythematosus. DIAGNOSIS  Your caregiver will attempt to find the underlying cause of your paresthesia. Your caregiver may:  Take your medical history.  Perform a physical exam.  Order various lab tests.  Order imaging tests. TREATMENT  Treatment for paresthesia depends on the underlying cause. HOME CARE INSTRUCTIONS  Avoid drinking alcohol.  You may consider massage or acupuncture to help relieve your symptoms.  Keep all follow-up appointments as directed by your caregiver. SEEK IMMEDIATE MEDICAL CARE IF:   You feel weak.  You have trouble walking or moving.  You have problems with speech or vision.  You feel confused.  You cannot control your bladder or bowel movements.  You feel numbness after an injury.  You faint.  Your burning or prickling feeling gets worse when walking.  You have pain, cramps, or dizziness.  You develop a rash. MAKE SURE YOU:  Understand these instructions.  Will watch your condition.  Will get help right away if you are not doing well or get worse. Document Released: 08/25/2002 Document Revised: 11/27/2011 Document Reviewed: 05/26/2011 [facility] Patient Information 2015 [place], [place]. This information is not intended to replace advice given to you by your health care provider. Make sure you discuss any questions you have with your health care provider.

## 2015-02-09 ENCOUNTER — Telehealth: Payer: Self-pay | Admitting: *Deleted

## 2015-02-09 NOTE — Telephone Encounter (Signed)
Pt states she has an appt with the [facility] center in [facility] for her mammogram next week.  She will have report forwarded to [place]

## 2015-02-12 ENCOUNTER — Ambulatory Visit: Payer: Medicare Other | Admitting: Family Medicine

## 2015-02-17 ENCOUNTER — Encounter: Payer: Self-pay | Admitting: Family Medicine

## 2015-02-17 ENCOUNTER — Ambulatory Visit (INDEPENDENT_AMBULATORY_CARE_PROVIDER_SITE_OTHER): Payer: Medicare Other | Admitting: Family Medicine

## 2015-02-17 VITALS — BP 130/80 | HR 85 | Temp 97.6°F | Wt 190.0 lb

## 2015-02-17 DIAGNOSIS — F339 Major depressive disorder, recurrent, unspecified: Secondary | ICD-10-CM | POA: Diagnosis not present

## 2015-02-17 DIAGNOSIS — E785 Hyperlipidemia, unspecified: Secondary | ICD-10-CM

## 2015-02-17 DIAGNOSIS — M79651 Pain in right thigh: Secondary | ICD-10-CM | POA: Diagnosis not present

## 2015-02-17 DIAGNOSIS — E119 Type 2 diabetes mellitus without complications: Secondary | ICD-10-CM

## 2015-02-17 LAB — LIPID PANEL
Cholesterol: 278 mg/dL — ABNORMAL HIGH (ref 0–200)
HDL: 48.1 mg/dL (ref >39.00)
NonHDL: 229.9
TRIGLYCERIDES: 303 mg/dL — AB (ref 0.0–149.0)
Total CHOL/HDL Ratio: 6
VLDL: 60.6 mg/dL — AB (ref 0.0–40.0)

## 2015-02-17 LAB — BASIC METABOLIC PANEL
BUN: 33 mg/dL — AB (ref 6–23)
CO2: 29 mEq/L (ref 19–32)
Calcium: 10.8 mg/dL — ABNORMAL HIGH (ref 8.4–10.5)
Chloride: 98 mEq/L (ref 96–112)
Creatinine, Ser: 1.08 mg/dL (ref 0.40–1.20)
GFR: 54.9 mL/min — AB (ref >60.00)
GLUCOSE: 145 mg/dL — AB (ref 70–99)
Potassium: 5 mEq/L (ref 3.5–5.1)
Sodium: 134 mEq/L — ABNORMAL LOW (ref 135–145)

## 2015-02-17 LAB — HEPATIC FUNCTION PANEL
ALT: 56 U/L — AB (ref 0–35)
AST: 34 U/L (ref 0–37)
Albumin: 4.9 g/dL (ref 3.5–5.2)
Alkaline Phosphatase: 109 U/L (ref 39–117)
Bilirubin, Direct: 0.1 mg/dL (ref 0.0–0.3)
Total Bilirubin: 0.5 mg/dL (ref 0.2–1.2)
Total Protein: 8.5 g/dL — ABNORMAL HIGH (ref 6.0–8.3)

## 2015-02-17 LAB — LDL CHOLESTEROL, DIRECT: LDL DIRECT: 191 mg/dL

## 2015-02-17 MED ORDER — TRAMADOL HCL 50 MG PO TABS: 50.0000 mg | ORAL_TABLET | Freq: Four times a day (QID) | ORAL | Status: DC | PRN

## 2015-02-17 NOTE — Progress Notes (Signed)
Subjective:    Patient ID: Yesenia Wilson, female    DOB: 13-Oct-1955, 59 y.o.   MRN: [id number]  HPI  follow-up multiple issues. Refer to previous note   Recurrent depression. Recent PH Q-9 score of 18. We added Abilify 2 mg at night to her Cymbalta. She has seen great improvement in terms of her depression. She also feels less anxious. She's not had any side effects. She is coping with stressors much better   New issue of right posterior thigh pain. She describes pain at rest and with walking. No back pain. Pain on the mid aspect of the thigh. Severe at times. Denies specific injury. No visible swelling. No ecchymosis. No claudication symptoms.   Recent myalgias. Discontinued Lipitor. Creatinine kinase normal. Upper extremity symptoms improved after stopping Lipitor   Recent mild hypercalcemia. Calcium 10.9 and no albumin for correction. She does take HCTZ.   type 2 diabetes. Recent A1c 7.3%. She is on metformin. She is trying to lose some weight.  Past Medical History  Diagnosis Date  . Hypertension   . Lupus   . Fibromyalgia   . Asthma   . Diabetes mellitus   . Hyperlipidemia   . Depression   . Thyroid disease   . Sleep apnea    Past Surgical History  Procedure Laterality Date  . Appendectomy    . Cholecystectomy    . Cesarean section    . Bladder tack    . Septoplasty      reports that she has never smoked. She has never used smokeless tobacco. She reports that she drinks alcohol. She reports that she does not use illicit drugs. family history is not on file. She was adopted. Allergies  Allergen Reactions  . Codeine   . Oxycodone-Acetaminophen     REACTION: GI upset  . Propoxyphene N-Acetaminophen     REACTION: nausea, rapid heartrate      Review of Systems  Constitutional: Negative for fatigue.  Eyes: Negative for visual disturbance.  Respiratory: Negative for cough, chest tightness, shortness of breath and wheezing.   Cardiovascular: Negative for chest  pain, palpitations and leg swelling.  Endocrine: Negative for polydipsia and polyuria.  Neurological: Negative for dizziness, seizures, syncope, weakness, light-headedness and headaches.  Psychiatric/Behavioral: Negative for suicidal ideas and sleep disturbance.       Objective:   Physical Exam  Constitutional: She is oriented to person, place, and time. She appears well-developed and well-nourished.  Neck: Neck supple. No thyromegaly present.  Cardiovascular: Normal rate and regular rhythm.  Exam reveals no gallop.   No murmur heard. Pulmonary/Chest: Effort normal and breath sounds normal. No respiratory distress. She has no wheezes. She has no rales.  Musculoskeletal: She exhibits no edema.  Neurological: She is alert and oriented to person, place, and time.  Psychiatric: She has a normal mood and affect. Her behavior is normal. Judgment and thought content normal.          Assessment & Plan:   #1 mild hypercalcemia. Probably related to thiazide usage. Recheck basic metabolic panel and albumen for corrected calcium.  #2 type 2 diabetes. Recent worsening control. Work on weight loss and reassess A1c in 3 months. Consider additional medication then if not improved #3 recurrent depression. Improved with addition of Abilify.  #4 right thigh pain. nonfocal exam. Question muscular. Refill tramadol for as needed use. Follow-up promptly for any swelling or other changes or pain not resolving over the next few weeks  #5 hyperlipidemia. Possible adverse effect from  Lipitor. Recheck lipids. Consider change to a different statin

## 2015-02-17 NOTE — Progress Notes (Signed)
Pre visit review using our clinic review tool, if applicable. No additional management support is needed unless otherwise documented below in the visit note. 

## 2015-02-18 ENCOUNTER — Other Ambulatory Visit: Payer: Self-pay | Admitting: Family Medicine

## 2015-02-18 ENCOUNTER — Other Ambulatory Visit: Payer: Self-pay

## 2015-02-18 DIAGNOSIS — E785 Hyperlipidemia, unspecified: Secondary | ICD-10-CM

## 2015-02-18 MED ORDER — ROSUVASTATIN CALCIUM 20 MG PO TABS: 20.0000 mg | ORAL_TABLET | Freq: Every day | ORAL | Status: DC

## 2015-02-21 DIAGNOSIS — Z79899 Other long term (current) drug therapy: Secondary | ICD-10-CM | POA: Diagnosis not present

## 2015-02-21 DIAGNOSIS — M79604 Pain in right leg: Secondary | ICD-10-CM | POA: Diagnosis not present

## 2015-02-21 DIAGNOSIS — Z885 Allergy status to narcotic agent status: Secondary | ICD-10-CM | POA: Diagnosis not present

## 2015-02-21 DIAGNOSIS — M25551 Pain in right hip: Secondary | ICD-10-CM | POA: Diagnosis not present

## 2015-02-21 DIAGNOSIS — Z791 Long term (current) use of non-steroidal anti-inflammatories (NSAID): Secondary | ICD-10-CM | POA: Diagnosis not present

## 2015-02-21 DIAGNOSIS — S76911A Strain of unspecified muscles, fascia and tendons at thigh level, right thigh, initial encounter: Secondary | ICD-10-CM | POA: Diagnosis not present

## 2015-02-21 DIAGNOSIS — M79609 Pain in unspecified limb: Secondary | ICD-10-CM | POA: Diagnosis not present

## 2015-02-21 DIAGNOSIS — S79921A Unspecified injury of right thigh, initial encounter: Secondary | ICD-10-CM | POA: Diagnosis not present

## 2015-02-21 DIAGNOSIS — E119 Type 2 diabetes mellitus without complications: Secondary | ICD-10-CM | POA: Diagnosis not present

## 2015-02-21 DIAGNOSIS — I10 Essential (primary) hypertension: Secondary | ICD-10-CM | POA: Diagnosis not present

## 2015-02-21 DIAGNOSIS — J45909 Unspecified asthma, uncomplicated: Secondary | ICD-10-CM | POA: Diagnosis not present

## 2015-02-21 DIAGNOSIS — E079 Disorder of thyroid, unspecified: Secondary | ICD-10-CM | POA: Diagnosis not present

## 2015-02-23 ENCOUNTER — Telehealth: Payer: Self-pay | Admitting: Family Medicine

## 2015-02-23 NOTE — Telephone Encounter (Signed)
We prescribed Tramadol at her visit.  Did she try that?

## 2015-02-23 NOTE — Telephone Encounter (Signed)
Pt said she was in last week and told Dr Elease Hashimoto about her right pain . She is asking if he will call her in something for pain or does she need appt .   Pharmacy ;  [place]

## 2015-02-24 NOTE — Telephone Encounter (Signed)
i suggest follow up by next week if no better

## 2015-02-24 NOTE — Telephone Encounter (Signed)
Pt stated that she did take the Tramadol and it did not help her.

## 2015-02-25 ENCOUNTER — Ambulatory Visit (INDEPENDENT_AMBULATORY_CARE_PROVIDER_SITE_OTHER): Payer: Medicare Other | Admitting: Family Medicine

## 2015-02-25 VITALS — BP 110/70 | HR 84 | Temp 98.2°F | Wt 190.0 lb

## 2015-02-25 DIAGNOSIS — M25561 Pain in right knee: Secondary | ICD-10-CM | POA: Diagnosis not present

## 2015-02-25 DIAGNOSIS — M79604 Pain in right leg: Secondary | ICD-10-CM | POA: Diagnosis not present

## 2015-02-25 MED ORDER — METHYLPREDNISOLONE ACETATE 80 MG/ML IJ SUSP
80.0000 mg | Freq: Once | INTRAMUSCULAR | Status: AC
  Administered 2015-02-25: 80 mg via INTRAMUSCULAR

## 2015-02-25 NOTE — Progress Notes (Signed)
   Subjective:    Patient ID: Yesenia Wilson, female    DOB: [birth year], 59 y.o.   MRN: [id number]  HPI Patient seen with progressive right lower extremity pain. She describes a throbbing pain which radiates from her right buttock area now down to about the mid leg. She's tried tramadol and Flexeril without relief. Pain is sometimes severe sitting but also with ambulation. She denies any injury. Denies any fevers or chills. No urine or stool incontinence. No lower extremity edema. No prior history of lumbar disc issues.  She has prior intolerance with both oxycodone and codeine which limits potential for opiod use  Past Medical History  Diagnosis Date  . Hypertension   . Lupus   . Fibromyalgia   . Asthma   . Diabetes mellitus   . Hyperlipidemia   . Depression   . Thyroid disease   . Sleep apnea    Past Surgical History  Procedure Laterality Date  . Appendectomy    . Cholecystectomy    . Cesarean section    . Bladder tack    . Septoplasty      reports that she has never smoked. She has never used smokeless tobacco. She reports that she drinks alcohol. She reports that she does not use illicit drugs. family history is not on file. She was adopted. Allergies  Allergen Reactions  . Codeine   . Oxycodone-Acetaminophen     REACTION: GI upset  . Propoxyphene N-Acetaminophen     REACTION: nausea, rapid heartrate      Review of Systems  Constitutional: Negative for fever, chills, appetite change and unexpected weight change.  Genitourinary: Negative for dysuria.  Neurological: Negative for weakness and numbness.       Objective:   Physical Exam  Constitutional: She appears well-developed and well-nourished.  Cardiovascular: Normal rate and regular rhythm.   Pulmonary/Chest: Effort normal and breath sounds normal. No respiratory distress. She has no wheezes. She has no rales.  Musculoskeletal:  Right leg raise are negative. Lower extremities no edema. Feet are warm to touch  with good dorsalis pedis pulses and good capillary refill.  Neurological:  Deep tendon reflexes slightly diminished right ankle compared with left. Symmetric knee reflexes Full strength throughout          Assessment & Plan:  Right lower extremity pain. Suspect radiculopathy pain. No evidence for PVD.   No strength deficits but does have slightly diminished right ankle reflex compared to left. Depo-Medrol 80 mg IM given. She cannot tolerate oral steroids. Intolerance with codeine and oxycodone as above. Reassess 2 weeks. Consider MRI at that point if no improvement and sooner as needed.

## 2015-02-25 NOTE — Patient Instructions (Signed)
Follow up sooner for any weakness or increasing back pain.

## 2015-02-25 NOTE — Progress Notes (Signed)
Pre visit review using our clinic review tool, if applicable. No additional management support is needed unless otherwise documented below in the visit note. 

## 2015-02-25 NOTE — Telephone Encounter (Signed)
Patient will be seen in the office today

## 2015-03-02 ENCOUNTER — Encounter (HOSPITAL_COMMUNITY): Payer: Self-pay

## 2015-03-02 ENCOUNTER — Emergency Department (HOSPITAL_COMMUNITY)
Admission: EM | Admit: 2015-03-02 | Discharge: 2015-03-02 | Disposition: A | Discharge status: Home / Self Care | Payer: Medicare Other | Attending: Emergency Medicine | Admitting: Emergency Medicine

## 2015-03-02 ENCOUNTER — Encounter: Payer: Self-pay | Admitting: Family Medicine

## 2015-03-02 ENCOUNTER — Emergency Department (HOSPITAL_COMMUNITY): Payer: Medicare Other

## 2015-03-02 ENCOUNTER — Ambulatory Visit (INDEPENDENT_AMBULATORY_CARE_PROVIDER_SITE_OTHER): Payer: Medicare Other | Admitting: Family Medicine

## 2015-03-02 ENCOUNTER — Telehealth: Payer: Self-pay | Admitting: *Deleted

## 2015-03-02 ENCOUNTER — Telehealth: Payer: Self-pay | Admitting: Family Medicine

## 2015-03-02 VITALS — BP 120/82 | HR 105 | Temp 97.7°F | Ht 63.0 in

## 2015-03-02 DIAGNOSIS — M797 Fibromyalgia: Secondary | ICD-10-CM | POA: Diagnosis not present

## 2015-03-02 DIAGNOSIS — M5116 Intervertebral disc disorders with radiculopathy, lumbar region: Secondary | ICD-10-CM

## 2015-03-02 DIAGNOSIS — F329 Major depressive disorder, single episode, unspecified: Secondary | ICD-10-CM | POA: Insufficient documentation

## 2015-03-02 DIAGNOSIS — I10 Essential (primary) hypertension: Secondary | ICD-10-CM | POA: Insufficient documentation

## 2015-03-02 DIAGNOSIS — R159 Full incontinence of feces: Secondary | ICD-10-CM | POA: Diagnosis not present

## 2015-03-02 DIAGNOSIS — J45909 Unspecified asthma, uncomplicated: Secondary | ICD-10-CM | POA: Diagnosis not present

## 2015-03-02 DIAGNOSIS — M25561 Pain in right knee: Secondary | ICD-10-CM | POA: Diagnosis not present

## 2015-03-02 DIAGNOSIS — Z8669 Personal history of other diseases of the nervous system and sense organs: Secondary | ICD-10-CM | POA: Diagnosis not present

## 2015-03-02 DIAGNOSIS — E119 Type 2 diabetes mellitus without complications: Secondary | ICD-10-CM | POA: Diagnosis not present

## 2015-03-02 DIAGNOSIS — E785 Hyperlipidemia, unspecified: Secondary | ICD-10-CM | POA: Insufficient documentation

## 2015-03-02 DIAGNOSIS — R32 Unspecified urinary incontinence: Secondary | ICD-10-CM

## 2015-03-02 DIAGNOSIS — M5416 Radiculopathy, lumbar region: Secondary | ICD-10-CM | POA: Diagnosis not present

## 2015-03-02 DIAGNOSIS — Z79899 Other long term (current) drug therapy: Secondary | ICD-10-CM | POA: Diagnosis not present

## 2015-03-02 DIAGNOSIS — M545 Low back pain: Secondary | ICD-10-CM | POA: Diagnosis present

## 2015-03-02 DIAGNOSIS — M79604 Pain in right leg: Secondary | ICD-10-CM | POA: Diagnosis not present

## 2015-03-02 DIAGNOSIS — E079 Disorder of thyroid, unspecified: Secondary | ICD-10-CM | POA: Diagnosis not present

## 2015-03-02 DIAGNOSIS — Z7982 Long term (current) use of aspirin: Secondary | ICD-10-CM | POA: Insufficient documentation

## 2015-03-02 DIAGNOSIS — M5126 Other intervertebral disc displacement, lumbar region: Secondary | ICD-10-CM | POA: Diagnosis not present

## 2015-03-02 LAB — BASIC METABOLIC PANEL
Anion gap: 10 (ref 5–15)
BUN: 22 mg/dL — AB (ref 6–20)
CO2: 27 mmol/L (ref 22–32)
Calcium: 10.2 mg/dL (ref 8.9–10.3)
Chloride: 100 mmol/L — ABNORMAL LOW (ref 101–111)
Creatinine, Ser: 1.09 mg/dL — ABNORMAL HIGH (ref 0.44–1.00)
GFR calc Af Amer: >60 mL/min (ref >60)
GFR calc non Af Amer: 54 mL/min — ABNORMAL LOW (ref >60)
Glucose, Bld: 153 mg/dL — ABNORMAL HIGH (ref 65–99)
Potassium: 4.1 mmol/L (ref 3.5–5.1)
Sodium: 137 mmol/L (ref 135–145)

## 2015-03-02 LAB — URINALYSIS, ROUTINE W REFLEX MICROSCOPIC
BILIRUBIN URINE: NEGATIVE
Glucose, UA: NEGATIVE mg/dL
Hgb urine dipstick: NEGATIVE
Ketones, ur: NEGATIVE mg/dL
Nitrite: NEGATIVE
PROTEIN: NEGATIVE mg/dL
Specific Gravity, Urine: 1.025 (ref 1.005–1.030)
Urobilinogen, UA: 1 mg/dL (ref 0.0–1.0)
pH: 5 (ref 5.0–8.0)

## 2015-03-02 LAB — URINE MICROSCOPIC-ADD ON

## 2015-03-02 LAB — CBC WITH DIFFERENTIAL/PLATELET
BASOS ABS: 0 10*3/uL (ref 0.0–0.1)
Basophils Relative: 0 % (ref 0–1)
EOS PCT: 6 % — AB (ref 0–5)
Eosinophils Absolute: 1.2 10*3/uL — ABNORMAL HIGH (ref 0.0–0.7)
HEMATOCRIT: 41.1 % (ref 36.0–46.0)
Hemoglobin: 13.7 g/dL (ref 12.0–15.0)
LYMPHS ABS: 4.6 10*3/uL — AB (ref 0.7–4.0)
Lymphocytes Relative: 24 % (ref 12–46)
MCH: 28.9 pg (ref 26.0–34.0)
MCHC: 33.3 g/dL (ref 30.0–36.0)
MCV: 86.7 fL (ref 78.0–100.0)
MONO ABS: 0.2 10*3/uL (ref 0.1–1.0)
MONOS PCT: 1 % — AB (ref 3–12)
Neutro Abs: 13.2 10*3/uL — ABNORMAL HIGH (ref 1.7–7.7)
Neutrophils Relative %: 69 % (ref 43–77)
PLATELETS: 357 10*3/uL (ref 150–400)
RBC: 4.74 MIL/uL (ref 3.87–5.11)
RDW: 13.9 % (ref 11.5–15.5)
WBC: 19.2 10*3/uL — AB (ref 4.0–10.5)

## 2015-03-02 MED ORDER — GADOBENATE DIMEGLUMINE 529 MG/ML IV SOLN
17.0000 mL | Freq: Once | INTRAVENOUS | Status: AC | PRN
  Administered 2015-03-02: 17 mL via INTRAVENOUS

## 2015-03-02 MED ORDER — OXYCODONE-ACETAMINOPHEN 5-325 MG PO TABS
1.0000 | ORAL_TABLET | ORAL | Status: DC | PRN
Start: 2015-03-02 — End: 2015-03-11

## 2015-03-02 MED ORDER — NAPROXEN 500 MG PO TABS: 500.0000 mg | ORAL_TABLET | Freq: Two times a day (BID) | ORAL | Status: DC

## 2015-03-02 MED ORDER — MORPHINE SULFATE 4 MG/ML IJ SOLN
4.0000 mg | Freq: Once | INTRAMUSCULAR | Status: AC
  Administered 2015-03-02: 4 mg via INTRAVENOUS
  Filled 2015-03-02: qty 1

## 2015-03-02 NOTE — ED Provider Notes (Signed)
CSN: [id number]     Arrival date & time 03/02/15  1423 History   First MD Initiated Contact with Patient 03/02/15 1559     Chief Complaint  Patient presents with  . Back Pain  . Leg Pain     (Consider location/radiation/quality/duration/timing/severity/associated sxs/prior Treatment) HPI Patient describes months of developing lower back pain that has been treated by chiropractor. She reports she was told she had "bone-on-bone. Despite going to the chiropractor, her symptoms have been worsening. She states that this point she actually doesn't have much pain in her back but has severe pain that radiates down the back and side of her right leg. She reports over the past couple of weeks now she has had her leg give out and fallen due to that. She has even started to use a rolling cart due to fear of falling from weakness in that leg. She states 3 days ago she had loss of continence of her bowel and her bladder. She has not had a recurrent episode of loss of continence of the bowel however the bladder seems to fill and then she has extreme urgency. She has not had fever or general illness. She has not had pain with urination or abdominal pain. The patient was seen by her PCP today and reports she was sent to the emergency department for evaluation. Patient reported being seen at [facility] in number weeks ago regarding a fall and leg pain. She reports getting x-rays on her back but no MRI. She states she was told at that time that there was no fracture or injury. Past Medical History  Diagnosis Date  . Hypertension   . Lupus   . Fibromyalgia   . Asthma   . Diabetes mellitus   . Hyperlipidemia   . Depression   . Thyroid disease   . Sleep apnea    Past Surgical History  Procedure Laterality Date  . Appendectomy    . Cholecystectomy    . Cesarean section    . Bladder tack    . Septoplasty     Family History  Problem Relation Age of Onset  . Adopted: Yes   History  Substance Use  Topics  . Smoking status: Never Smoker   . Smokeless tobacco: Never Used  . Alcohol Use: Yes   OB History    No data available     Review of Systems 10 Systems reviewed and are negative for acute change except as noted in the HPI.    Allergies  Codeine; Oxycodone-acetaminophen; and Propoxyphene n-acetaminophen  Home Medications   Prior to Admission medications   Medication Sig Start Date End Date Taking? Authorizing Provider  ARIPiprazole (ABILIFY) 2 MG tablet Take 1 tablet (2 mg total) by mouth at bedtime. 01/22/15  Yes Eulas Post, MD  aspirin 81 MG tablet Take 81 mg by mouth daily.     Yes Historical Provider, MD  Cyanocobalamin (VITAMIN B-12) 2500 MCG SUBL Place 1 tablet under the tongue daily.   Yes Historical Provider, MD  DULoxetine (CYMBALTA) 60 MG capsule Take 1 capsule (60 mg total) by mouth daily. 04/08/13  Yes Marletta Lor, MD  esomeprazole ([place]) 40 MG capsule TAKE ONE CAPSULE BY MOUTH IN THE MORNING BEFORE BREAKFAST Patient taking differently: Take 40 mg by mouth daily.  11/26/13  Yes Eulas Post, MD  irbesartan-hydrochlorothiazide (AVALIDE) 300-12.5 MG per tablet Take 1 tablet by mouth daily.   Yes Historical Provider, MD  levalbuterol [facility] HFA) 45 MCG/ACT inhaler Inhale  1-2 puffs into the lungs 4 (four) times daily as needed for shortness of breath.    Yes Historical Provider, MD  metFORMIN (GLUCOPHAGE) 1000 MG tablet Take 1 tablet (1,000 mg total) by mouth 2 (two) times daily. 06/27/13  Yes Eulas Post, MD  metoprolol tartrate (LOPRESSOR) 25 MG tablet TAKE ONE TABLET BY MOUTH EVERY DAY 08/09/12  Yes Eulas Post, MD  rosuvastatin (CRESTOR) 20 MG tablet Take 1 tablet (20 mg total) by mouth daily. 02/18/15  Yes Eulas Post, MD  SYNTHROID 112 MCG tablet TAKE ONE TABLET BY MOUTH EVERY DAY Patient taking differently: Take 112 mcg by mouth daily before breakfast. TAKE ONE TABLET BY MOUTH EVERY DAY 02/27/13  Yes Eulas Post, MD   traMADol (ULTRAM) 50 MG tablet Take 1 tablet (50 mg total) by mouth every 6 (six) hours as needed. Patient taking differently: Take 50 mg by mouth every 6 (six) hours as needed for moderate pain.  02/17/15  Yes Eulas Post, MD  glucose blood test strip OneTouch test strip, use 2 times daily as instructed 02/28/12   Eulas Post, MD  naproxen (NAPROSYN) 500 MG tablet Take 1 tablet (500 mg total) by mouth 2 (two) times daily with a meal. Patient not taking: Reported on 03/02/2015 03/02/15   Lucretia Kern, DO  ONE TOUCH LANCETS MISC OneTouch lancets, use 2 times daily as directed 02/28/12   Eulas Post, MD  oxyCODONE-acetaminophen (PERCOCET/ROXICET) 5-325 MG per tablet Take 1-2 tablets by mouth every 4 (four) hours as needed for severe pain. 03/02/15   Charlesetta Shanks, MD   BP 120/67 mmHg  Pulse 110  Temp(Src) 97.8 F (36.6 C) (Oral)  Resp 20  SpO2 100% Physical Exam  Constitutional: She is oriented to person, place, and time. She appears well-developed and well-nourished.  HENT:  Head: Normocephalic and atraumatic.  Eyes: EOM are normal. Pupils are equal, round, and reactive to light.  Neck: Neck supple.  Cardiovascular: Normal rate, regular rhythm, normal heart sounds and intact distal pulses.   Pulmonary/Chest: Effort normal and breath sounds normal.  Abdominal: Soft. Bowel sounds are normal. She exhibits no distension. There is no tenderness.  Musculoskeletal: Normal range of motion. She exhibits no edema or tenderness.  Positive straight leg raise at 30 with severe pain on the right.  Neurological: She is alert and oriented to person, place, and time. She has normal strength. Coordination normal. GCS eye subscore is 4. GCS verbal subscore is 5. GCS motor subscore is 6.  Very mild differential in plantar flexion strength from the right to the left. Right 4 out of 5 versus left 5 out of 5. Also possible subtle difference in extension of lower leg. Patellar reflexes are 2+ and  symmetric. Achilles reflexes 2+ symmetric. Patient does not endorse sensory differential to light touch.  Skin: Skin is warm, dry and intact.  Psychiatric: She has a normal mood and affect.    ED Course  Procedures (including critical care time) Labs Review Labs Reviewed  BASIC METABOLIC PANEL - Abnormal; Notable for the following:    Chloride 100 (*)    Glucose, Bld 153 (*)    BUN 22 (*)    Creatinine, Ser 1.09 (*)    GFR calc non Af Amer 54 (*)    All other components within normal limits  CBC WITH DIFFERENTIAL/PLATELET - Abnormal; Notable for the following:    WBC 19.2 (*)    Monocytes Relative 1 (*)    Eosinophils  Relative 6 (*)    Neutro Abs 13.2 (*)    Lymphs Abs 4.6 (*)    Eosinophils Absolute 1.2 (*)    All other components within normal limits  URINALYSIS, ROUTINE W REFLEX MICROSCOPIC (NOT AT [facility]) - Abnormal; Notable for the following:    APPearance CLOUDY (*)    Leukocytes, UA SMALL (*)    All other components within normal limits  URINE MICROSCOPIC-ADD ON - Abnormal; Notable for the following:    Squamous Epithelial / LPF MANY (*)    Bacteria, UA MANY (*)    Casts HYALINE CASTS (*)    All other components within normal limits    Imaging Review Mr Lumbar Spine W Wo Contrast (assess For Abscess, Cord Compression)  03/02/2015   CLINICAL DATA:  Low back pain.  Right leg pain  EXAM: MRI LUMBAR SPINE WITHOUT AND WITH CONTRAST  TECHNIQUE: Multiplanar and multiecho pulse sequences of the lumbar spine were obtained without and with intravenous contrast.  CONTRAST:  72mL MULTIHANCE GADOBENATE DIMEGLUMINE 529 MG/ML IV SOLN  COMPARISON:  None.  FINDINGS: Normal lumbar alignment. Negative for fracture or mass. No evidence of spinal infection. Conus medullaris normal and terminates at L1-2.  Postcontrast imaging reveals normal enhancement.  L1-2:  Negative  L2-3:  Mild disc degeneration without spinal stenosis  L3-4: Mild disc bulging. Small right foraminal and right lateral disc  protrusion without nerve root compression.  L4-5: Mild disc degeneration. Minimal central disc protrusion without neural impingement. Mild spinal stenosis. Mild facet and ligamentum flavum hypertrophy  L5-S1: Moderately large central disc protrusion with flattening of the right S1 nerve root. Disc protrusion is touching the left S1 nerve root but not causing any significant distortion of the left S1 nerve root.  IMPRESSION: Small right foraminal disc protrusion L3-4 without neural impingement  Small central disc protrusion L4-5 with mild spinal stenosis  Moderately large central disc protrusion L5-S1 with flattening of the right S1 nerve root.   Electronically Signed   By: Franchot Gallo M.D.   On: 03/02/2015 17:52     EKG Interpretation None      MDM   Final diagnoses:  Lumbar disc herniation with radiculopathy   the patient has radiculopathy with right leg pain. MRI does show herniated disc at L5-S1. At this point the patient does have intact motor function to strength testing. She will be counseled on the necessity of follow-up with neurosurgery for ongoing management. She'll be prescribed at [facility] for pain control. The patient does have leukocytosis but no signs of acute illness. Abscess is not present on CT. The patient does report having gotten a recent steroid-dependent injection to her hip for pain control that was not effective. With the patient clinically well and no focus of infection identified, I suspect this is most likely related to her recent steroid injection.    Charlesetta Shanks, MD 03/02/15 [id number]

## 2015-03-02 NOTE — Telephone Encounter (Signed)
I spoke with the pt and informed her per Dr Maudie Mercury she received records from [facility] and there was no record of an MRI, only notes of an ultrasound and hip x-rays and she recommended she go to the emergency room for an evaluation with MRI of her back.  Patient states she had x-rays and I advised her Dr Maudie Mercury saw notes of x-rays of the hip and she recommends she go to the ER now and she agreed.

## 2015-03-02 NOTE — Progress Notes (Addendum)
HPI:  R Leg pain: -started several weeks ago after a fall, no LOC or head injury -reports this is not improving -reports severe pain in R buttock, and down entire post leg, perceived weakness - walking with a walker, also had fecal incontinence once and urinary incontinence several episodes a few days ago, now resolved which she thinks is related to a prior bladder tac surgery and thinks is not related to this -denies: fevers, chills, malaise, further falls, back or head pain, pain with urination, hematuria -flexeril and tylenol, tramadol did not work for pain, reports cant take codiene or hydrocodone due to allergies -has steroid shot on 02/25/15 with and this did not help -2 family members with MS -today reports did have MRI and extensive evaluation with [facility] 1.5 weeks ago for this and somehow did not mention this to PCP at recent visit - reports she was told everything looked ok -she also has been seeing a chiropractor -she is under a lot of stress lately -wants to see specialist, refuses emergency room eval  ROS: See pertinent positives and negatives per HPI.  Past Medical History  Diagnosis Date  . Hypertension   . Lupus   . Fibromyalgia   . Asthma   . Diabetes mellitus   . Hyperlipidemia   . Depression   . Thyroid disease   . Sleep apnea     Past Surgical History  Procedure Laterality Date  . Appendectomy    . Cholecystectomy    . Cesarean section    . Bladder tack    . Septoplasty      Family History  Problem Relation Age of Onset  . Adopted: Yes    History   Social History  . Marital Status: Single    Spouse Name: N/A  . Number of Children: N/A  . Years of Education: N/A   Social History Main Topics  . Smoking status: Never Smoker   . Smokeless tobacco: Never Used  . Alcohol Use: Yes  . Drug Use: No  . Sexual Activity: Not on file   Other Topics Concern  . None   Social History Narrative     Current outpatient  prescriptions:  .  ARIPiprazole (ABILIFY) 2 MG tablet, Take 1 tablet (2 mg total) by mouth at bedtime., Disp: 30 tablet, Rfl: 3 .  aspirin 81 MG tablet, Take 81 mg by mouth daily.  , Disp: , Rfl:  .  Cyanocobalamin (VITAMIN B-12) 2500 MCG SUBL, Place 1 tablet under the tongue daily., Disp: , Rfl:  .  cyclobenzaprine (FLEXERIL) 10 MG tablet, Take 10 mg by mouth 3 (three) times daily as needed for muscle spasms., Disp: , Rfl:  .  DULoxetine (CYMBALTA) 60 MG capsule, Take 1 capsule (60 mg total) by mouth daily., Disp: 90 capsule, Rfl: 3 .  esomeprazole (NEXIUM) 40 MG capsule, TAKE ONE CAPSULE BY MOUTH IN THE MORNING BEFORE BREAKFAST (Patient taking differently: Take 40 mg by mouth daily. ), Disp: 90 capsule, Rfl: 1 .  glucose blood test strip, OneTouch test strip, use 2 times daily as instructed, Disp: 100 each, Rfl: 3 .  irbesartan-hydrochlorothiazide (AVALIDE) 300-12.5 MG per tablet, Take 1 tablet by mouth daily., Disp: , Rfl:  .  levalbuterol (XOPENEX HFA) 45 MCG/ACT inhaler, Inhale 1-2 puffs into the lungs 4 (four) times daily as needed.  , Disp: , Rfl:  .  metFORMIN (GLUCOPHAGE) 1000 MG tablet, Take 1 tablet (1,000 mg total) by mouth 2 (two) times daily., Disp: 60 tablet,  Rfl: 5 .  metoprolol tartrate (LOPRESSOR) 25 MG tablet, TAKE ONE TABLET BY MOUTH EVERY DAY, Disp: 30 tablet, Rfl: 11 .  metoprolol tartrate (LOPRESSOR) 25 MG tablet, Take 25 mg by mouth daily., Disp: , Rfl:  .  ONE TOUCH LANCETS MISC, OneTouch lancets, use 2 times daily as directed, Disp: 200 each, Rfl: 3 .  rosuvastatin (CRESTOR) 20 MG tablet, Take 1 tablet (20 mg total) by mouth daily., Disp: 30 tablet, Rfl: 5 .  SYNTHROID 112 MCG tablet, TAKE ONE TABLET BY MOUTH EVERY DAY (Patient taking differently: Take 112 mcg by mouth daily before breakfast. TAKE ONE TABLET BY MOUTH EVERY DAY), Disp: 90 tablet, Rfl: 3 .  traMADol (ULTRAM) 50 MG tablet, Take 1 tablet (50 mg total) by mouth every 6 (six) hours as needed., Disp: 60 tablet,  Rfl: 0 .  naproxen (NAPROSYN) 500 MG tablet, Take 1 tablet (500 mg total) by mouth 2 (two) times daily with a meal., Disp: 20 tablet, Rfl: 0  EXAM:  Filed Vitals:   03/02/15 1117  BP: 120/82  Pulse: 105  Temp: 97.7 F (36.5 C)    There is no weight on file to calculate BMI.  GENERAL: vitals reviewed and listed above, alert, oriented, appears well hydrated and in no acute distress  HEENT: atraumatic, conjunttiva clear, no obvious abnormalities on inspection of external nose and ears  NECK: no obvious masses on inspection  LUNGS: clear to auscultation bilaterally, no wheezes, rales or rhonchi, good air movement  CV: HRRR, no peripheral edema  MS: antalgic gait, walking with walker and favoring R leg. Reports decreased sensation to light touch in R lat thigh and lateral R foot, normal strength throughout in LE bilat, normal DTRs throughout in LE bilater, neg SLRT and CLRT, neg facet loading bilat, no sig TTP in lower back, glutes, PSIS. Sig pain in post leg with FABER, Neg FADIR  PSYCH: pleasant and cooperative, no obvious depression or anxiety  ASSESSMENT AND PLAN:  Discussed the following assessment and plan:  Pain in joint, lower leg, right - Plan: Ambulatory referral to Neurology, naproxen (NAPROSYN) 500 MG tablet  Fecal incontinence - Plan: Ambulatory referral to Neurology  Urinary incontinence, unspecified incontinence type - Plan: Ambulatory referral to Neurology  -we discussed possible serious and likely etiologies, workup and treatment, treatment risks and return precautions -pt refuses emergency eval , exam non-specific, no weakness or DTR abnormality on exam today. boowel/bladder incont concerning and advised emerg eval - she insists she had an MRI 1.5 weeks ago and refuses, pt reports bb incont has resolved and she feels is due to urgency since bladder surgery remotely -advised assistant to obtain records and MRI from [facility], however these are not yet available,  urgent referral to neurology after discussion with PCP - advised assistant to fax OSH records to neurologist once appt scheduled so that they will have these at her appointment -trial NSAID for pain in interim given allergies and nothing else helped -advised if worsening, weakness, further falls, further bowel or bladder incontinence to seek emergency evaluation -Patient advised to return or notify a doctor immediately if symptoms worsen or persist or new concerns arise.  There are no Patient Instructions on file for this visit.   Colin Benton R.

## 2015-03-02 NOTE — ED Notes (Signed)
Pt still at MRI

## 2015-03-02 NOTE — ED Notes (Addendum)
Patient still in MRI.  

## 2015-03-02 NOTE — Discharge Instructions (Signed)
Herniated Disk A herniated disk occurs when a disk in your spine bulges out too far. This condition is also called a ruptured disk or slipped disk. Your spine (backbone) is made up of bones called vertebrae. Between each pair of vertebrae is an oval disk with a soft, spongy center that acts as a shock absorber when you move. The spongy center is surrounded by a tough outer ring. When you have a herniated disk, the spongy center of the disk bulges out or ruptures through the outer ring. A herniated disk can press on a nerve between your vertebrae and cause pain. A herniated disk can occur anywhere in your back or neck area, but the lower back is the most common spot. CAUSES  In many cases, a herniated disk occurs just from getting older. As you age, the spongy insides of your disks tend to shrink and dry out. A herniated disk can result from gradual wear and tear. Injury or sudden strain can also cause a herniated disk.  RISK FACTORS Aging is the main risk factor for a herniated disk. Other risk factors include:  Being a man between the ages of 72 and 19 years.  Having a job that requires heavy lifting, bending, or twisting.  Having a job that requires long hours of driving.  Not getting enough exercise.  Being overweight.  Smoking. SIGNS AND SYMPTOMS  Signs and symptoms depend on which disk is herniated.  For a herniated disk in the lower back, you may have sharp pain in:  One part of your leg, hip, or buttocks.  The back of your calf.  The top or sole of your foot (sciatica).   For a herniated disk in the neck, you may feel pain:  When you move your neck.  Near or over your shoulder blade.  That moves to your upper arm, forearm, or fingers.   You may also have muscle weakness. It may be hard to:  Lift your leg or arm.  Stand on your toes.  Squeeze tightly with one of your hands.  Other symptoms can include:  Numbness or tingling in the affected areas of your  body.  Loss of bladder or bowel control. This is a rare but serious sign of a severe herniated disk in the lower back. DIAGNOSIS  Your health care provider will do a physical exam. During this exam, you may have to move certain body parts or assume various positions. For example, your health care provider may do the straight-leg test. This is a good way to test for a herniated disk in your lower back. In this test, the health care provider lifts your leg while you lie on your back. This is to see if you feel pain down your leg. Your health care provider will also check for numbness or loss of feeling.  Your health care provider will also check your:  Reflexes.  Muscle strength.  Posture.  Other tests may be done to help in making a diagnosis. These may include:  An X-ray of the spine to rule out other causes of back pain.   Other imaging studies, such as an MRI or CT scan. This is to check whether the herniated disk is pressing on your spinal canal.  Electromyography (EMG). This test checks the nerves that control muscles. It is sometimes used to identify the specific area of nerve involvement.  TREATMENT  In many cases, herniated disk symptoms go away over a period of days or weeks. You will most  likely be free of symptoms in 3-4 months. Treatment may include the following:  The initial treatment for a herniated disk is ashort period of rest.  Bed rest is often limited to 1 or 2 days. Resting for too long delays recovery.  If you have a herniated disk in your lower back, you should avoid sitting as much as possible because sitting increases pressure on the disk.  Medicines. These may include:   Nonsteroidal anti-inflammatory drugs (NSAIDs).  Muscle relaxants for back spasms.  Narcotic pain medicine if your pain is very bad.   Steroid injections. You may need these along the involved nerve root to help control pain. The steroid is injected in the area of the herniated disk.  It helps by reducing swelling around the disk.  Physical therapy. This may include exercises to strengthen the muscles that help support your spine.   You may need surgery if other treatments do not work.  HOME CARE INSTRUCTIONS Follow all your health care provider's instructions. These may include:  Take all medicines as directed by your health care provider.  Rest for 2 days and then start moving.  Do not sit or stand for long periods of time.  Maintain good posture when sitting and standing.  Avoid movements that cause pain, such as bending or lifting.  When you are able to start lifting things again:  [place] with your knees.  Keep your back straight.  Hold heavy objects close to your body.  If you are overweight, ask your health care provider to help you start a weight-loss program.  When you are able to start exercising, ask your health care provider how much and what type of exercise is best for you.  Work with a physical therapist on stretching and strengthening exercises for your back.  Do not wear high-heeled shoes.  Do not sleep on your belly.  Do not smoke.  Keep all follow-up visits as directed by your health care provider. SEEK MEDICAL CARE IF:  You have back or neck pain that is not getting better after 4 weeks.  You have very bad pain in your back or neck.  You develop numbness, tingling, or weakness along with pain. SEEK IMMEDIATE MEDICAL CARE IF:   You have numbness, tingling, or weakness that makes you unable to use your arms or legs.  You lose control of your bladder or bowels.  You have dizziness or fainting.  You have shortness of breath.  MAKE SURE YOU:   Understand these instructions.  Will watch your condition.  Will get help right away if you are not doing well or get worse. Document Released: 09/01/2000 Document Revised: 01/19/2014 Document Reviewed: 08/08/2013 [facility] Patient Information 2015 [place], [place]. This information  is not intended to replace advice given to you by your health care provider. Make sure you discuss any questions you have with your health care provider.

## 2015-03-02 NOTE — Progress Notes (Signed)
Pre visit review using our clinic review tool, if applicable. No additional management support is needed unless otherwise documented below in the visit note. 

## 2015-03-02 NOTE — Telephone Encounter (Signed)
Patient Name: Yesenia Wilson  DOB: [birth year]    Initial Comment caller states she has pain in her leg and wants to know how long it takes a cortisone shot to take effect   Nurse Assessment  Nurse: Leilani Merl, RN, Nira Conn Date/Time (Eastern Time): 03/02/2015 9:19:08 AM  Confirm and document reason for call. If symptomatic, describe symptoms. ---caller states she has pain in her entire right leg and wants to know how long it takes a cortisone shot to take effect. Caller states that she was seen on 06/09 and was given medication but it is not working. She has lost control of her bowels and bladder 2 times since she was seen on 06/09. If she does not go to the bathroom as soon as she feels it, she will not make it to the bathroom, caller is having to use a walker to walk. No back pain currently.  Has the patient traveled out of the country within the last 30 days? ---Not Applicable  Does the patient require triage? ---Yes  Related visit to physician within the last 2 weeks? ---Yes  Does the PT have any chronic conditions? (i.e. diabetes, asthma, etc.) ---Yes  List chronic conditions. ---HTN,     Guidelines    Guideline Title Affirmed Question Affirmed Notes  Leg Pain [1] SEVERE pain (e.g., excruciating, unable to do any normal activities) AND [2] not improved after 2 hours of pain medicine    Final Disposition User   See Physician within 4 Hours (or PCP triage) Leilani Merl, RN, Nira Conn    Comments  Appt scheduled with Dr. Maudie Mercury today at 11 am.

## 2015-03-02 NOTE — Patient Instructions (Signed)
BEFORE YOU LEAVE: -schedule follow up with your doctor in 1 month  We place an urgent referral to the neurologist, if you have not received a call about this appointment in the next 1-2 days please call are office.  Try the naprosyn for pain as instructed  See emergency care if recurrent bowel or bladder incontinence, falls, worsening weakness or worsening

## 2015-03-02 NOTE — ED Notes (Signed)
Pt reports 6 months of lower lower back pain, seen by chriopractor who said she had bone on bone.  Pt fell down flight of stairs 6 weeks ago that caused back pain to become worse.  Onset 4 weeks ago posterior right leg pain down to knee.  Pain is so severe she is having to use walker.  Pt has been seen several times by PCP, received injection in right hip with no relief.  Onset 02-27-15 pt lost control of bowel and bladder, since then when pt has the urge to use bathroom has to immediately go.  Pt sent here today from PCP for MRI.

## 2015-03-11 ENCOUNTER — Telehealth: Payer: Self-pay | Admitting: *Deleted

## 2015-03-11 ENCOUNTER — Ambulatory Visit (INDEPENDENT_AMBULATORY_CARE_PROVIDER_SITE_OTHER): Payer: Medicare Other | Admitting: Family Medicine

## 2015-03-11 ENCOUNTER — Encounter: Payer: Self-pay | Admitting: Family Medicine

## 2015-03-11 VITALS — BP 120/70 | HR 74 | Temp 97.4°F | Wt 193.0 lb

## 2015-03-11 DIAGNOSIS — M5416 Radiculopathy, lumbar region: Secondary | ICD-10-CM | POA: Diagnosis not present

## 2015-03-11 MED ORDER — OXYCODONE-ACETAMINOPHEN 5-325 MG PO TABS
1.0000 | ORAL_TABLET | ORAL | Status: DC | PRN
Start: 2015-03-11 — End: 2015-06-30

## 2015-03-11 MED ORDER — OXYCODONE-ACETAMINOPHEN 5-325 MG PO TABS
1.0000 | ORAL_TABLET | ORAL | Status: DC | PRN
Start: 2015-03-11 — End: 2015-03-11

## 2015-03-11 MED ORDER — OXYCODONE-ACETAMINOPHEN 5-325 MG PO TABS: 1.0000 | ORAL_TABLET | ORAL | Status: DC | PRN

## 2015-03-11 NOTE — Patient Instructions (Signed)

## 2015-03-11 NOTE — Progress Notes (Signed)
   Subjective:    Patient ID: Yesenia Wilson, female    DOB: June 22, 1956, 59 y.o.   MRN: [id number]  HPI A seen for follow-up progressive low back pain with right radiculopathy symptoms. She had presented here couple weeks ago with reports of some loss of bladder and bowel control and was promptly referred to emergency department for further evaluation. She had MRI scan there which showed moderately large central disc protrusion with flattening of the right S1 nerve root. Patient was described oxycodone which helps somewhat with her pain. She's not had any fever, chills, appetite, or weight changes recently. Her pain is mild to moderate at this point. She still has some right radiculopathy symptoms.Left numbness. Question intermittent weakness.  Prior to all this she had seen a chiropractor had plain films. Her chronic problems are as outlined below  Past Medical History  Diagnosis Date  . Hypertension   . Lupus   . Fibromyalgia   . Asthma   . Diabetes mellitus   . Hyperlipidemia   . Depression   . Thyroid disease   . Sleep apnea    Past Surgical History  Procedure Laterality Date  . Appendectomy    . Cholecystectomy    . Cesarean section    . Bladder tack    . Septoplasty      reports that she has never smoked. She has never used smokeless tobacco. She reports that she drinks alcohol. She reports that she does not use illicit drugs. family history is not on file. She was adopted. Allergies  Allergen Reactions  . Codeine   . Oxycodone-Acetaminophen     REACTION: GI upset  . Propoxyphene N-Acetaminophen     REACTION: nausea, rapid heartrate  '    Review of Systems  Constitutional: Negative for fever, chills, appetite change and unexpected weight change.  Respiratory: Negative for shortness of breath.   Cardiovascular: Negative for chest pain.  Gastrointestinal: Negative for abdominal pain.  Genitourinary: Negative for dysuria.  Musculoskeletal: Positive for back pain.    Neurological: Positive for weakness.  Hematological: Negative for adenopathy.       Objective:   Physical Exam  Constitutional: She appears well-developed and well-nourished. No distress.  Cardiovascular: Normal rate and regular rhythm.   Pulmonary/Chest: Effort normal and breath sounds normal. No respiratory distress. She has no wheezes. She has no rales.  Musculoskeletal: She exhibits no edema.  Straight leg raises are negative  Neurological:  Slightly diminished right ankle reflex compared to the left. She has some weakness with right plantar flexion dorsiflexion versus decreased effort secondary to pain. Normal sensory function          Assessment & Plan:  Persistent lumbar back pain with right radiculopathy symptoms. She does have slightly diminished right ankle reflex compared to the left and recent MRI scan with disc protrusion L5-S1 with impingement of the right S1 nerve root.

## 2015-03-11 NOTE — Progress Notes (Signed)
Pre visit review using our clinic review tool, if applicable. No additional management support is needed unless otherwise documented below in the visit note. 

## 2015-03-11 NOTE — Telephone Encounter (Signed)
Patient called to cancel her new patient appointment she stated she seen Dr. Elease Hashimoto and she does not need to see a neurologist. Referring provided notified Renea Ee)

## 2015-03-15 DIAGNOSIS — M5126 Other intervertebral disc displacement, lumbar region: Secondary | ICD-10-CM | POA: Diagnosis not present

## 2015-03-23 DIAGNOSIS — Z79899 Other long term (current) drug therapy: Secondary | ICD-10-CM | POA: Diagnosis not present

## 2015-03-23 DIAGNOSIS — Z791 Long term (current) use of non-steroidal anti-inflammatories (NSAID): Secondary | ICD-10-CM | POA: Diagnosis not present

## 2015-03-23 DIAGNOSIS — M5431 Sciatica, right side: Secondary | ICD-10-CM | POA: Diagnosis not present

## 2015-03-23 DIAGNOSIS — M79604 Pain in right leg: Secondary | ICD-10-CM | POA: Diagnosis not present

## 2015-03-23 DIAGNOSIS — E119 Type 2 diabetes mellitus without complications: Secondary | ICD-10-CM | POA: Diagnosis not present

## 2015-03-23 DIAGNOSIS — K219 Gastro-esophageal reflux disease without esophagitis: Secondary | ICD-10-CM | POA: Diagnosis not present

## 2015-03-23 DIAGNOSIS — Z7722 Contact with and (suspected) exposure to environmental tobacco smoke (acute) (chronic): Secondary | ICD-10-CM | POA: Diagnosis not present

## 2015-03-23 DIAGNOSIS — I10 Essential (primary) hypertension: Secondary | ICD-10-CM | POA: Diagnosis not present

## 2015-03-23 DIAGNOSIS — G4733 Obstructive sleep apnea (adult) (pediatric): Secondary | ICD-10-CM | POA: Diagnosis not present

## 2015-03-23 DIAGNOSIS — E039 Hypothyroidism, unspecified: Secondary | ICD-10-CM | POA: Diagnosis not present

## 2015-03-23 DIAGNOSIS — M545 Low back pain: Secondary | ICD-10-CM | POA: Diagnosis not present

## 2015-03-23 DIAGNOSIS — M797 Fibromyalgia: Secondary | ICD-10-CM | POA: Diagnosis not present

## 2015-03-23 DIAGNOSIS — J449 Chronic obstructive pulmonary disease, unspecified: Secondary | ICD-10-CM | POA: Diagnosis not present

## 2015-03-23 DIAGNOSIS — M5441 Lumbago with sciatica, right side: Secondary | ICD-10-CM | POA: Diagnosis not present

## 2015-03-23 DIAGNOSIS — Z79891 Long term (current) use of opiate analgesic: Secondary | ICD-10-CM | POA: Diagnosis not present

## 2015-03-23 DIAGNOSIS — M329 Systemic lupus erythematosus, unspecified: Secondary | ICD-10-CM | POA: Diagnosis not present

## 2015-03-24 DIAGNOSIS — M5126 Other intervertebral disc displacement, lumbar region: Secondary | ICD-10-CM | POA: Diagnosis not present

## 2015-03-24 DIAGNOSIS — M9983 Other biomechanical lesions of lumbar region: Secondary | ICD-10-CM | POA: Diagnosis not present

## 2015-03-24 DIAGNOSIS — M47816 Spondylosis without myelopathy or radiculopathy, lumbar region: Secondary | ICD-10-CM | POA: Diagnosis not present

## 2015-03-24 DIAGNOSIS — M797 Fibromyalgia: Secondary | ICD-10-CM | POA: Diagnosis not present

## 2015-03-25 ENCOUNTER — Ambulatory Visit: Payer: Medicare Other | Admitting: Neurology

## 2015-03-25 DIAGNOSIS — M5126 Other intervertebral disc displacement, lumbar region: Secondary | ICD-10-CM | POA: Diagnosis not present

## 2015-03-29 DIAGNOSIS — M5116 Intervertebral disc disorders with radiculopathy, lumbar region: Secondary | ICD-10-CM | POA: Diagnosis not present

## 2015-04-01 DIAGNOSIS — M5126 Other intervertebral disc displacement, lumbar region: Secondary | ICD-10-CM | POA: Diagnosis not present

## 2015-04-01 DIAGNOSIS — G4730 Sleep apnea, unspecified: Secondary | ICD-10-CM | POA: Diagnosis not present

## 2015-04-01 DIAGNOSIS — F419 Anxiety disorder, unspecified: Secondary | ICD-10-CM | POA: Diagnosis not present

## 2015-04-01 DIAGNOSIS — E119 Type 2 diabetes mellitus without complications: Secondary | ICD-10-CM | POA: Diagnosis not present

## 2015-04-01 DIAGNOSIS — R0602 Shortness of breath: Secondary | ICD-10-CM | POA: Diagnosis not present

## 2015-04-01 DIAGNOSIS — J45909 Unspecified asthma, uncomplicated: Secondary | ICD-10-CM | POA: Diagnosis not present

## 2015-04-02 DIAGNOSIS — Z01811 Encounter for preprocedural respiratory examination: Secondary | ICD-10-CM | POA: Diagnosis not present

## 2015-04-02 DIAGNOSIS — G4730 Sleep apnea, unspecified: Secondary | ICD-10-CM | POA: Diagnosis not present

## 2015-04-02 DIAGNOSIS — M5126 Other intervertebral disc displacement, lumbar region: Secondary | ICD-10-CM | POA: Diagnosis not present

## 2015-04-02 DIAGNOSIS — F419 Anxiety disorder, unspecified: Secondary | ICD-10-CM | POA: Diagnosis not present

## 2015-04-02 DIAGNOSIS — M5127 Other intervertebral disc displacement, lumbosacral region: Secondary | ICD-10-CM | POA: Diagnosis not present

## 2015-04-02 DIAGNOSIS — J45909 Unspecified asthma, uncomplicated: Secondary | ICD-10-CM | POA: Diagnosis not present

## 2015-04-02 DIAGNOSIS — M5116 Intervertebral disc disorders with radiculopathy, lumbar region: Secondary | ICD-10-CM | POA: Diagnosis not present

## 2015-04-02 DIAGNOSIS — R0602 Shortness of breath: Secondary | ICD-10-CM | POA: Diagnosis not present

## 2015-04-02 DIAGNOSIS — E119 Type 2 diabetes mellitus without complications: Secondary | ICD-10-CM | POA: Diagnosis not present

## 2015-04-03 DIAGNOSIS — F419 Anxiety disorder, unspecified: Secondary | ICD-10-CM | POA: Diagnosis not present

## 2015-04-03 DIAGNOSIS — M5126 Other intervertebral disc displacement, lumbar region: Secondary | ICD-10-CM | POA: Diagnosis not present

## 2015-04-03 DIAGNOSIS — J45909 Unspecified asthma, uncomplicated: Secondary | ICD-10-CM | POA: Diagnosis not present

## 2015-04-03 DIAGNOSIS — R0602 Shortness of breath: Secondary | ICD-10-CM | POA: Diagnosis not present

## 2015-04-03 DIAGNOSIS — E119 Type 2 diabetes mellitus without complications: Secondary | ICD-10-CM | POA: Diagnosis not present

## 2015-04-03 DIAGNOSIS — G4730 Sleep apnea, unspecified: Secondary | ICD-10-CM | POA: Diagnosis not present

## 2015-04-05 ENCOUNTER — Ambulatory Visit: Payer: Medicare Other | Admitting: Family Medicine

## 2015-04-06 DIAGNOSIS — M519 Unspecified thoracic, thoracolumbar and lumbosacral intervertebral disc disorder: Secondary | ICD-10-CM | POA: Diagnosis not present

## 2015-04-13 ENCOUNTER — Other Ambulatory Visit: Payer: Self-pay | Admitting: Family Medicine

## 2015-04-22 ENCOUNTER — Telehealth: Payer: Self-pay | Admitting: Family Medicine

## 2015-04-22 ENCOUNTER — Other Ambulatory Visit: Payer: Self-pay

## 2015-04-22 MED ORDER — SYNTHROID 112 MCG PO TABS: 112.0000 ug | ORAL_TABLET | Freq: Every day | ORAL | Status: DC

## 2015-04-22 NOTE — Telephone Encounter (Signed)
Rx sent to pharmacy   

## 2015-04-22 NOTE — Telephone Encounter (Signed)
Pt needs name brand synthroid 112 mcg #30 w/refills. Pharm [facility]. This [facility]

## 2015-04-26 ENCOUNTER — Other Ambulatory Visit: Payer: Self-pay | Admitting: Family Medicine

## 2015-04-26 NOTE — Telephone Encounter (Signed)
Last visit 03/11/15 Last refill 02/17/15 #60 0 refill

## 2015-04-27 NOTE — Telephone Encounter (Signed)
i do not see this on her med list.  She has seen neurosurgeon for her back pain and she is under their management for that and would defer to them.

## 2015-04-30 ENCOUNTER — Ambulatory Visit (INDEPENDENT_AMBULATORY_CARE_PROVIDER_SITE_OTHER): Payer: Medicare Other | Admitting: Family Medicine

## 2015-04-30 ENCOUNTER — Encounter: Payer: Self-pay | Admitting: Family Medicine

## 2015-04-30 VITALS — BP 130/90 | HR 90 | Temp 97.5°F | Wt 194.0 lb

## 2015-04-30 DIAGNOSIS — M5416 Radiculopathy, lumbar region: Secondary | ICD-10-CM | POA: Diagnosis not present

## 2015-04-30 DIAGNOSIS — E119 Type 2 diabetes mellitus without complications: Secondary | ICD-10-CM

## 2015-04-30 LAB — HEMOGLOBIN A1C: Hgb A1c MFr Bld: 7.1 % — ABNORMAL HIGH (ref 4.6–6.5)

## 2015-04-30 MED ORDER — TRAMADOL HCL 50 MG PO TABS: ORAL_TABLET | ORAL | Status: DC

## 2015-04-30 NOTE — Progress Notes (Signed)
Pre visit review using our clinic review tool, if applicable. No additional management support is needed unless otherwise documented below in the visit note. 

## 2015-04-30 NOTE — Progress Notes (Signed)
   Subjective:    Patient ID: Yesenia Wilson, female    DOB: [birth year], 59 y.o.   MRN: [id number]  HPI  patient seen for medical follow-up   She had right lumbar radiculopathy back in the spring and underwent L5-S1 discectomy in early July. She is still having some low back pain. She had called requesting refill tramadol. She had been on Percocet following surgery but wish to transition over to tramadol. She has follow-up with  Neurosurgeon into this month. She still has some mild weakness in the foot and ankle and mild numbness in the right foot intermittently. No loss of urine or stool continence   Type 2 diabetes. Last A1c 7.3%. She had received some steroid-induced which may of exacerbated. She plans to lose some weight soon.  Past Medical History  Diagnosis Date  . Hypertension   . Lupus   . Fibromyalgia   . Asthma   . Diabetes mellitus   . Hyperlipidemia   . Depression   . Thyroid disease   . Sleep apnea    Past Surgical History  Procedure Laterality Date  . Appendectomy    . Cholecystectomy    . Cesarean section    . Bladder tack    . Septoplasty      reports that she has never smoked. She has never used smokeless tobacco. She reports that she drinks alcohol. She reports that she does not use illicit drugs. family history is not on file. She was adopted. Allergies  Allergen Reactions  . Oxycodone-Acetaminophen     REACTION: GI upset  . Propoxyphene N-Acetaminophen     REACTION: nausea, rapid heartrate      Review of Systems  Constitutional: Negative for fever, chills and fatigue.  Eyes: Negative for visual disturbance.  Respiratory: Negative for cough, chest tightness, shortness of breath and wheezing.   Cardiovascular: Negative for chest pain, palpitations and leg swelling.  Endocrine: Negative for polydipsia and polyuria.  Musculoskeletal: Positive for back pain.  Neurological: Negative for dizziness, seizures, syncope, weakness, light-headedness, numbness  and headaches.       Objective:   Physical Exam  Constitutional: She appears well-developed and well-nourished.  Cardiovascular: Normal rate and regular rhythm.   Pulmonary/Chest: Effort normal and breath sounds normal. No respiratory distress. She has no wheezes. She has no rales.  Musculoskeletal: She exhibits no edema.  Neurological:  She has diminished right ankle reflex compared to the left. Knee reflexes are symmetric. She has mild weakness with right plantar flexion and dorsiflexion.          Assessment & Plan:  #1 type 2 diabetes. Generally had good control but recent worsening probably related to steroid-induced. Recheck A1c today. She is encouraged to lose some weight  #2 low back pain with recent L5-S1 discectomy. We agreed to refill tramadol 1-2 every 6 hours for pain. She is encouraged to keep close follow-up with her neurosurgeon.

## 2015-05-03 ENCOUNTER — Telehealth: Payer: Self-pay | Admitting: Family Medicine

## 2015-05-03 NOTE — Telephone Encounter (Signed)
Pt returning your call Pt would like results of labs. Please call back.

## 2015-05-03 NOTE — Telephone Encounter (Signed)
Left message for patient to return call.

## 2015-05-21 ENCOUNTER — Ambulatory Visit: Payer: Medicare Other | Admitting: Family Medicine

## 2015-05-23 ENCOUNTER — Other Ambulatory Visit: Payer: Self-pay | Admitting: Family Medicine

## 2015-05-27 ENCOUNTER — Other Ambulatory Visit: Payer: Self-pay | Admitting: Family Medicine

## 2015-06-21 ENCOUNTER — Telehealth: Payer: Self-pay | Admitting: Family Medicine

## 2015-06-21 NOTE — Telephone Encounter (Signed)
Pt request refill of the following: DULoxetine (CYMBALTA) 60 MG capsule   Phamacy: [facility] [place]

## 2015-06-23 ENCOUNTER — Other Ambulatory Visit: Payer: Self-pay | Admitting: Family Medicine

## 2015-06-23 NOTE — Telephone Encounter (Signed)
Refill for 6 months. 

## 2015-06-23 NOTE — Telephone Encounter (Signed)
Pt last visit 04/30/15. Last Rx refill 04/08/13. Please advise

## 2015-06-23 NOTE — Telephone Encounter (Signed)
Pt is out °

## 2015-06-24 ENCOUNTER — Other Ambulatory Visit: Payer: Self-pay | Admitting: Family Medicine

## 2015-06-24 ENCOUNTER — Other Ambulatory Visit: Payer: Self-pay

## 2015-06-24 MED ORDER — DULOXETINE HCL 60 MG PO CPEP: 60.0000 mg | ORAL_CAPSULE | Freq: Every day | ORAL | Status: DC

## 2015-06-24 MED ORDER — ROSUVASTATIN CALCIUM 20 MG PO TABS: 20.0000 mg | ORAL_TABLET | Freq: Every day | ORAL | Status: DC

## 2015-06-24 NOTE — Telephone Encounter (Signed)
Rx wast sent

## 2015-06-28 ENCOUNTER — Other Ambulatory Visit: Payer: Self-pay | Admitting: Family Medicine

## 2015-06-30 ENCOUNTER — Encounter: Payer: Self-pay | Admitting: Family Medicine

## 2015-06-30 ENCOUNTER — Telehealth: Payer: Self-pay | Admitting: Family Medicine

## 2015-06-30 ENCOUNTER — Ambulatory Visit (INDEPENDENT_AMBULATORY_CARE_PROVIDER_SITE_OTHER): Payer: Medicare Other | Admitting: Family Medicine

## 2015-06-30 VITALS — BP 150/100 | HR 103 | Temp 98.3°F | Wt 197.7 lb

## 2015-06-30 DIAGNOSIS — E785 Hyperlipidemia, unspecified: Secondary | ICD-10-CM | POA: Diagnosis not present

## 2015-06-30 DIAGNOSIS — I10 Essential (primary) hypertension: Secondary | ICD-10-CM | POA: Diagnosis not present

## 2015-06-30 DIAGNOSIS — E119 Type 2 diabetes mellitus without complications: Secondary | ICD-10-CM

## 2015-06-30 LAB — LDL CHOLESTEROL, DIRECT: Direct LDL: 86 mg/dL

## 2015-06-30 LAB — LIPID PANEL
CHOL/HDL RATIO: 4
Cholesterol: 156 mg/dL (ref 0–200)
HDL: 43.4 mg/dL (ref >39.00)
NONHDL: 112.17
Triglycerides: 242 mg/dL — ABNORMAL HIGH (ref 0.0–149.0)
VLDL: 48.4 mg/dL — AB (ref 0.0–40.0)

## 2015-06-30 LAB — HEPATIC FUNCTION PANEL
ALT: 32 U/L (ref 0–35)
AST: 28 U/L (ref 0–37)
Albumin: 4.6 g/dL (ref 3.5–5.2)
Alkaline Phosphatase: 87 U/L (ref 39–117)
BILIRUBIN DIRECT: 0.1 mg/dL (ref 0.0–0.3)
BILIRUBIN TOTAL: 0.4 mg/dL (ref 0.2–1.2)
Total Protein: 7.8 g/dL (ref 6.0–8.3)

## 2015-06-30 MED ORDER — GLUCOSE BLOOD VI STRP: ORAL_STRIP | Status: DC

## 2015-06-30 MED ORDER — ONETOUCH LANCETS MISC: Status: DC

## 2015-06-30 NOTE — Progress Notes (Signed)
Pre visit review using our clinic review tool, if applicable. No additional management support is needed unless otherwise documented below in the visit note. 

## 2015-06-30 NOTE — Telephone Encounter (Signed)
RX for Verio test strips sent to pharmacy.  Tried to contact patient but unable to leave message.

## 2015-06-30 NOTE — Progress Notes (Signed)
   Subjective:    Patient ID: Yesenia Wilson, female    DOB: 1955-12-01, 59 y.o.   MRN: [id number]  HPI Medical follow-up  Type 2 diabetes. A1c in August 7.1%. She lost her home monitor and requesting replacement. Remains on metformin No polyuria or polydipsia. Compliant with metformin  Hypertension treated with Avalide and metoprolol. No history of elevation by previous home readings. Denies headaches. No chest pains. No peripheral edema issues.  Severe hyperlipidemia. Started Crestor couple months ago and she's had no side effects. Due for follow-up lipid and hepatic in this time.  She declines flu vaccine  Past Medical History  Diagnosis Date  . Hypertension   . Lupus ([place])   . Fibromyalgia   . Asthma   . Diabetes mellitus   . Hyperlipidemia   . Depression   . Thyroid disease   . Sleep apnea    Past Surgical History  Procedure Laterality Date  . Appendectomy    . Cholecystectomy    . Cesarean section    . Bladder tack    . Septoplasty      reports that she has never smoked. She has never used smokeless tobacco. She reports that she drinks alcohol. She reports that she does not use illicit drugs. family history is not on file. She was adopted. Allergies  Allergen Reactions  . Oxycodone-Acetaminophen     REACTION: GI upset  . Propoxyphene N-Acetaminophen     REACTION: nausea, rapid heartrate      Review of Systems  Constitutional: Negative for fatigue and unexpected weight change.  Eyes: Negative for visual disturbance.  Respiratory: Negative for cough, chest tightness, shortness of breath and wheezing.   Cardiovascular: Negative for chest pain, palpitations and leg swelling.  Endocrine: Negative for polydipsia and polyuria.  Genitourinary: Negative for dysuria.  Neurological: Negative for dizziness, seizures, syncope, weakness, light-headedness and headaches.       Objective:   Physical Exam  Constitutional: She appears well-developed and well-nourished.    Neck: Neck supple. No JVD present. No thyromegaly present.  Cardiovascular: Normal rate and regular rhythm.  Exam reveals no gallop.   No murmur heard. Pulmonary/Chest: Effort normal and breath sounds normal. No respiratory distress. She has no wheezes. She has no rales.  Musculoskeletal: She exhibits no edema.          Assessment & Plan:  #1 hypertension. Poorly controlled. We discussed options. Recommend weight loss. Monitor closely at home. Be in touch if consistently over 140/90. Otherwise reassess 3 months. #2 severe hyperlipidemia. Recheck lipid and hepatic panel #3 type 2 diabetes. History of good control. Last A1c less than 3 months ago. Recheck at follow-up in 3 months #4 health maintenance. Flu vaccine recommended and patient declines

## 2015-06-30 NOTE — Telephone Encounter (Addendum)
Pt was seen today and needs one touch verio test strips send to [facility]. Per pharm must fax rx with icd 10 code on rx

## 2015-06-30 NOTE — Patient Instructions (Signed)
Monitor BP at home and be in touch if consistently > 140/90

## 2015-07-05 ENCOUNTER — Telehealth: Payer: Self-pay | Admitting: *Deleted

## 2015-07-05 NOTE — Telephone Encounter (Signed)
PLEASE NOTE: All timestamps contained within this report are represented as [place] Standard Time. CONFIDENTIALTY NOTICE: This fax transmission is intended only for the addressee. It contains information that is legally privileged, confidential or otherwise protected from use or disclosure. If you are not the intended recipient, you are strictly prohibited from reviewing, disclosing, copying using or disseminating any of this information or taking any action in reliance on or regarding this information. If you have received this fax in error, please notify us immediately by telephone so that we can arrange for its return to [place]. Phone: [phone], Toll-Free: [phone], Fax: [phone] Page: 1 of 2 Call Id: [id number] [facility] - Client [place] Patient Name: Yesenia Wilson Gender: Female DOB: 1956-04-01 Age: 59 Y 11 M 5 D Return Phone Number: [phone] (Primary) Address: City/State/Zip: [place] Client [facility] - Client Client Site [facility] - Night Physician Carolann Littler Contact Type Call Call Type Triage / Clinical Relationship To Patient Self Return Phone Number [phone] (Primary) Chief Complaint Blood Pressure High Initial Comment Caller states bp was 170/101 this morning when she got up and then took took it again and it said it detected an irregular heartbeat. she went to the fire department and they took it 171/110 and O2 was 97. Says has felt nauseated and that fire department did not say anything about hr. PreDisposition Go to ED Nurse Assessment Nurse: Windle Guard, RN, Lesa Date/Time (Eastern Time): 07/03/2015 11:19:39 AM Confirm and document reason for call. If symptomatic, describe symptoms. ---Caller states her blood pressure is 171/110. Has the patient traveled out of the country within the last 30 days? ---No Does the patient have any  new or worsening symptoms? ---Yes Will a triage be completed? ---Yes Related visit to physician within the last 2 weeks? ---No Does the PT have any chronic conditions? (i.e. diabetes, asthma, etc.) ---Yes List chronic conditions. ---Diabetes, HTN Guidelines Guideline Title Affirmed Question Affirmed Notes Nurse Date/Time (Eastern Time) High Blood Pressure BP # 160/100 Conner, RN, Lesa 07/03/2015 11:20:58 AM Disp. Time Eilene Ghazi Time) Disposition Final User 07/03/2015 11:27:23 AM See PCP When Office is Open (within 3 days) Yes Conner, RN, Emmaline Kluver Caller Understands: Yes Disagree/Comply: Comply PLEASE NOTE: All timestamps contained within this report are represented as [place] Standard Time. CONFIDENTIALTY NOTICE: This fax transmission is intended only for the addressee. It contains information that is legally privileged, confidential or otherwise protected from use or disclosure. If you are not the intended recipient, you are strictly prohibited from reviewing, disclosing, copying using or disseminating any of this information or taking any action in reliance on or regarding this information. If you have received this fax in error, please notify us immediately by telephone so that we can arrange for its return to [place]. Phone: [phone], Toll-Free: [phone], Fax: [phone] Page: 2 of 2 Call Id: [id number] Care Advice Given Per Guideline SEE PCP WITHIN 3 DAYS: * You need to be seen within 2 or 3 days. Call your doctor during regular office hours and make an appointment. An urgent care center is often the best source of care if your doctor's office is closed or you can't get an appointment. NOTE: If office will be open tomorrow, tell caller to call then, not in 3 days. HIGH BLOOD PRESSURE: * Untreated high blood pressure may cause damage to your heart, brain, kidneys, and eyes. * Treatment of high blood pressure can reduce the risk of stroke, heart attack,  and heart failure. * EAT HEALTHY:  Eat a diet rich in fresh fruits and vegetables, dietary fiber, non-animal protein (e.g., soy), and low-fat dairy products. Avoid foods with a high content of saturated fat or cholesterol. LIFESTYLE MODIFICATIONS - The following things can help you reduce your blood pressure: * DECREASE SODIUM INTAKE: Aim to eat less than 2.4 g (100 mmol) of sodium each day. Unfortunately 75% of the salt in the average person's diet is in pre-processed foods. * EXERCISE, BE MORE PHYSICALLY ACTIVE: Do at least 30 minutes of aerobic exercise (e.g., brisk walking) most days of the week. Other examples of aerobic activities cycling, jogging, and swimming. * REDUCE WEIGHT AND WAIST LINE: It is important to maintain a normal body weight. The goal should be a BMI (body mass index) under 25 for men and women, a waist circumference under 40 inches (102 cm) in men, and a waist circumference under 35 inches (88 cm) in women. * REDUCE STRESS: Find activities that help reduce your stress. Examples might include meditation, yoga, or even a restful walk in a park. CALL BACK IF: * Weakness or numbness of the face, arm or leg on one side of the body occurs * Difficulty walking, difficulty talking, or severe headache occurs * Chest pain or difficulty breathing occurs * Your blood pressure is over 180/110 * You become worse. CARE ADVICE given per High Blood Pressure (Adult) guideline. After Care Instructions Given Call Event Type User Date / Time Description

## 2015-07-05 NOTE — Telephone Encounter (Signed)
Please advise 

## 2015-07-05 NOTE — Telephone Encounter (Signed)
Agree with advice to be seen within 3 days.

## 2015-07-06 NOTE — Telephone Encounter (Signed)
Pt has an appt tomorrow the 19th at 10:15

## 2015-07-07 ENCOUNTER — Ambulatory Visit (INDEPENDENT_AMBULATORY_CARE_PROVIDER_SITE_OTHER): Payer: Medicare Other | Admitting: Family Medicine

## 2015-07-07 ENCOUNTER — Encounter: Payer: Self-pay | Admitting: Family Medicine

## 2015-07-07 VITALS — BP 130/90 | HR 112 | Temp 98.3°F | Wt 194.9 lb

## 2015-07-07 DIAGNOSIS — I10 Essential (primary) hypertension: Secondary | ICD-10-CM

## 2015-07-07 MED ORDER — METOPROLOL SUCCINATE ER 50 MG PO TB24: 50.0000 mg | ORAL_TABLET | Freq: Every day | ORAL | Status: DC

## 2015-07-07 MED ORDER — GLUCOSE BLOOD VI STRP: ORAL_STRIP | Status: DC

## 2015-07-07 NOTE — Patient Instructions (Addendum)
STOP the Metoprolol 25 mg and Start Toprol XL 50 mg once daily. Let me know if consistent BP > 140/90

## 2015-07-07 NOTE — Progress Notes (Signed)
Pre visit review using our clinic review tool, if applicable. No additional management support is needed unless otherwise documented below in the visit note. 

## 2015-07-07 NOTE — Progress Notes (Signed)
   Subjective:    Patient ID: Yesenia Wilson, female    DOB: 21-May-1956, 59 y.o.   MRN: [id number]  HPI  Patient here to discuss hypertension. She currently takes Avalide and metoprolol 25 mg daily. Recently she took her blood pressure at home and got a reading of 171/118. She then went to the drugstore and the fire department in the same day and Very similar readings. She's not any headaches or dizziness. No chest pains. Compliant with regular medications. Watches her sodium intake closely.  Past Medical History  Diagnosis Date  . Hypertension   . Lupus ([place])   . Fibromyalgia   . Asthma   . Diabetes mellitus   . Hyperlipidemia   . Depression   . Thyroid disease   . Sleep apnea    Past Surgical History  Procedure Laterality Date  . Appendectomy    . Cholecystectomy    . Cesarean section    . Bladder tack    . Septoplasty      reports that she has never smoked. She has never used smokeless tobacco. She reports that she drinks alcohol. She reports that she does not use illicit drugs. family history is not on file. She was adopted. Allergies  Allergen Reactions  . Oxycodone-Acetaminophen     REACTION: GI upset  . Propoxyphene N-Acetaminophen     REACTION: nausea, rapid heartrate     Review of Systems  Constitutional: Negative for fatigue and unexpected weight change.  Eyes: Negative for visual disturbance.  Respiratory: Negative for cough, chest tightness, shortness of breath and wheezing.   Cardiovascular: Negative for chest pain, palpitations and leg swelling.  Neurological: Negative for dizziness, seizures, syncope, weakness, light-headedness and headaches.       Objective:   Physical Exam  Constitutional: She appears well-developed and well-nourished.  Neck: Neck supple. No thyromegaly present.  Cardiovascular: Normal rate and regular rhythm.  Exam reveals no gallop.   Pulmonary/Chest: Effort normal and breath sounds normal. No respiratory distress. She has no  wheezes. She has no rales.  Musculoskeletal: She exhibits no edema.          Assessment & Plan:  Hypertension. Recent severe elevation as above. Minimally elevated today. Change metoprolol 25 mg to Toprol-XL 50 mg once daily. Continue Avalide. Monitor blood pressure and be in touch if consistently greater than 140/90

## 2015-07-25 ENCOUNTER — Other Ambulatory Visit: Payer: Self-pay | Admitting: Family Medicine

## 2015-08-05 ENCOUNTER — Telehealth: Payer: Self-pay | Admitting: Family Medicine

## 2015-08-05 NOTE — Telephone Encounter (Signed)
Okay to fill? 

## 2015-08-05 NOTE — Telephone Encounter (Signed)
Pt needs refill on metformin 1000 mg #180 and estrace 5 mg #30 [facility]. The pt said estrace was originally prescribed by her gyn. Pt no longer see gyn

## 2015-08-06 NOTE — Telephone Encounter (Signed)
OK to refill for 6 months 

## 2015-08-09 ENCOUNTER — Telehealth: Payer: Self-pay | Admitting: *Deleted

## 2015-08-09 MED ORDER — METFORMIN HCL 1000 MG PO TABS: 1000.0000 mg | ORAL_TABLET | Freq: Two times a day (BID) | ORAL | Status: DC

## 2015-08-09 MED ORDER — ESTRADIOL 0.5 MG PO TABS: 0.5000 mg | ORAL_TABLET | Freq: Every day | ORAL | Status: DC

## 2015-08-09 NOTE — Telephone Encounter (Signed)
error 

## 2015-08-10 ENCOUNTER — Ambulatory Visit: Payer: Medicare Other | Admitting: Family Medicine

## 2015-08-14 ENCOUNTER — Other Ambulatory Visit: Payer: Self-pay | Admitting: Family Medicine

## 2015-08-19 ENCOUNTER — Telehealth: Payer: Self-pay | Admitting: Family Medicine

## 2015-08-19 NOTE — Telephone Encounter (Signed)
Attempted again to call patient. Awaiting call back or to see if patient checks into ED.

## 2015-08-19 NOTE — Telephone Encounter (Signed)
Left detailed message stating that we have been notified of her SX and that we agree with her proceeding to the ER. I did ask her to return our call.

## 2015-08-19 NOTE — Telephone Encounter (Signed)
Nurse Assessment  Nurse: Orvan Seen, RN, Jacquilin Date/Time (Eastern Time): 08/19/2015 10:49:13 AM  Confirm and document reason for call. If symptomatic, describe symptoms. ---Caller states been having bad nausea, she has acid reflux, and having discomfort in her chest, - Symptoms started 3 days. Burning pain in the stomach into the chest. Currently taking Nexium. Pain a 4/10  Has the patient traveled out of the country within the last 30 days? ---Not Applicable  Does the patient have any new or worsening symptoms? ---Yes  Will a triage be completed? ---Yes  Related visit to physician within the last 2 weeks? ---No  Does the PT have any chronic conditions? (i.e. diabetes, asthma, etc.) ---Yes  List chronic conditions. ---gerd, htn, copd, hypothroid  Is this a behavioral health call? ---No     Guidelines      Guideline Title Affirmed Question Affirmed Notes Nurse Date/Time (Eastern Time)  Chest Pain History of prior "blood clot" in leg or lungs (i.e., deep vein thrombosis, pulmonary embolism)  Orvan Seen, RN, Jacquilin 08/19/2015 10:50:31 AM   Disp. Time Eilene Ghazi Time) Disposition Final User   08/19/2015 10:43:05 AM Send to Urgent Queue  Fransico Michael    08/19/2015 10:54:06 AM Go to ED Now Yes Orvan Seen, RN, Jacquilin        Caller Understands: Yes  Disagree/Comply: Disagree  Disagree/Comply Reason: Disagree with instructions   Care Advice Given Per Guideline    GO TO ED NOW: You need to be seen in the Emergency Department. Go to the ER at ___________ [place] now. Drive carefully. DRIVING: Another adult should drive. CALL EMS IF: * You become worse.     After Care Instructions Given     Call Event Type User Date / Time Description

## 2015-08-24 ENCOUNTER — Other Ambulatory Visit: Payer: Self-pay | Admitting: Family Medicine

## 2015-08-27 ENCOUNTER — Ambulatory Visit: Payer: Medicare Other | Admitting: Family Medicine

## 2015-08-30 ENCOUNTER — Other Ambulatory Visit: Payer: Self-pay | Admitting: Family Medicine

## 2015-09-10 DIAGNOSIS — I10 Essential (primary) hypertension: Secondary | ICD-10-CM | POA: Diagnosis not present

## 2015-09-10 DIAGNOSIS — K760 Fatty (change of) liver, not elsewhere classified: Secondary | ICD-10-CM | POA: Diagnosis not present

## 2015-09-10 DIAGNOSIS — R109 Unspecified abdominal pain: Secondary | ICD-10-CM | POA: Diagnosis not present

## 2015-09-10 DIAGNOSIS — Z7984 Long term (current) use of oral hypoglycemic drugs: Secondary | ICD-10-CM | POA: Diagnosis not present

## 2015-09-10 DIAGNOSIS — E079 Disorder of thyroid, unspecified: Secondary | ICD-10-CM | POA: Diagnosis not present

## 2015-09-10 DIAGNOSIS — J45909 Unspecified asthma, uncomplicated: Secondary | ICD-10-CM | POA: Diagnosis not present

## 2015-09-10 DIAGNOSIS — E119 Type 2 diabetes mellitus without complications: Secondary | ICD-10-CM | POA: Diagnosis not present

## 2015-09-10 DIAGNOSIS — N760 Acute vaginitis: Secondary | ICD-10-CM | POA: Diagnosis not present

## 2015-09-10 DIAGNOSIS — B373 Candidiasis of vulva and vagina: Secondary | ICD-10-CM | POA: Diagnosis not present

## 2015-09-10 DIAGNOSIS — Z79899 Other long term (current) drug therapy: Secondary | ICD-10-CM | POA: Diagnosis not present

## 2015-09-10 DIAGNOSIS — M329 Systemic lupus erythematosus, unspecified: Secondary | ICD-10-CM | POA: Diagnosis not present

## 2015-09-13 ENCOUNTER — Other Ambulatory Visit: Payer: Self-pay | Admitting: Family Medicine

## 2015-09-29 ENCOUNTER — Ambulatory Visit: Payer: Medicare Other | Admitting: Family Medicine

## 2015-10-16 ENCOUNTER — Other Ambulatory Visit: Payer: Self-pay | Admitting: Family Medicine

## 2015-10-18 ENCOUNTER — Other Ambulatory Visit: Payer: Self-pay | Admitting: Family Medicine

## 2015-11-04 ENCOUNTER — Ambulatory Visit (INDEPENDENT_AMBULATORY_CARE_PROVIDER_SITE_OTHER): Payer: Medicare Other | Admitting: Family Medicine

## 2015-11-04 VITALS — BP 132/98 | HR 111 | Ht 63.0 in | Wt 189.9 lb

## 2015-11-04 DIAGNOSIS — T731XXA Deprivation of water, initial encounter: Secondary | ICD-10-CM | POA: Diagnosis not present

## 2015-11-04 DIAGNOSIS — E119 Type 2 diabetes mellitus without complications: Secondary | ICD-10-CM

## 2015-11-04 DIAGNOSIS — R739 Hyperglycemia, unspecified: Secondary | ICD-10-CM | POA: Diagnosis not present

## 2015-11-04 DIAGNOSIS — IMO0001 Reserved for inherently not codable concepts without codable children: Secondary | ICD-10-CM

## 2015-11-04 LAB — POCT GLYCOSYLATED HEMOGLOBIN (HGB A1C): HEMOGLOBIN A1C: 11.5

## 2015-11-04 LAB — POCT GLUCOSE (DEVICE FOR HOME USE): Glucose Fasting, POC: 440 mg/dL — AB (ref 70–99)

## 2015-11-04 MED ORDER — GLIMEPIRIDE 2 MG PO TABS: 2.0000 mg | ORAL_TABLET | Freq: Every day | ORAL | Status: DC

## 2015-11-04 NOTE — Patient Instructions (Signed)
AVOID juices, sodas, sweet tea Reduce sugars and white starches in diet Monitor blood sugars and bring in log of readings to review in two weeks. Start the Amaryl one at breakfast and be sure to take your metformin twice daily.

## 2015-11-04 NOTE — Progress Notes (Signed)
   Subjective:    Patient ID: Yesenia Wilson, female    DOB: Aug 04, 1956, 60 y.o.   MRN: [id number]  HPI  patient comes in today with complaint of increased thirst over the past several days  She has long history of type 2 diabetes.  Last week she was out of town and forgot to take her metformin.  She generally has well-controlled diabetes. Last A1c 7.1%.  Very poor compliance with diet. She has been drinking lots of juices, colas, and sweet tea  Couple of weeks ago started developing some increased thirst and urine frequency. She has had some mild weight loss since last visit here.  Normally takes metformin 1000 mg twice daily.  As above, she forgot her medicine all last week and just started back to metformin yesterday.  She's had some mild nausea but no vomiting. No diarrhea. No abdominal pain.  Denies any fever or chills.  Past Medical History  Diagnosis Date  . Hypertension   . Lupus ([place])   . Fibromyalgia   . Asthma   . Diabetes mellitus   . Hyperlipidemia   . Depression   . Thyroid disease   . Sleep apnea    Past Surgical History  Procedure Laterality Date  . Appendectomy    . Cholecystectomy    . Cesarean section    . Bladder tack    . Septoplasty      reports that she has never smoked. She has never used smokeless tobacco. She reports that she drinks alcohol. She reports that she does not use illicit drugs. family history is not on file. She was adopted. Allergies  Allergen Reactions  . Oxycodone-Acetaminophen     REACTION: GI upset  . Propoxyphene N-Acetaminophen     REACTION: nausea, rapid heartrate      Review of Systems  Constitutional: Negative for fever and chills.  HENT: Negative for congestion and sore throat.   Respiratory: Negative for shortness of breath.   Cardiovascular: Negative for chest pain.  Gastrointestinal: Negative for vomiting, abdominal pain and diarrhea.  Endocrine: Positive for polydipsia and polyuria.  Psychiatric/Behavioral:  Negative for confusion.       Objective:   Physical Exam  Constitutional: She appears well-developed and well-nourished.  HENT:  Mouth/Throat: Oropharynx is clear and moist.  Neck: Neck supple.  Cardiovascular: Normal rate and regular rhythm.   Pulmonary/Chest: Effort normal and breath sounds normal. No respiratory distress. She has no wheezes. She has no rales.  Abdominal: Soft. There is no tenderness.  Musculoskeletal: She exhibits no edema.  Lymphadenopathy:    She has no cervical adenopathy.  Neurological: She is alert.          Assessment & Plan:   Type 2 diabetes. She presents today with very high blood sugar of 440 4 hours postprandial with hemoglobin A1c 11.5% which is dramatically up from 7.1% several months ago. She was out of her medication wall last week but we explained with A1c of 11.5 this reflects several weeks if not months of poor control. Very poor compliance with diet.   We strongly encouraged leaving off juices, colas, and sweetened tea and reduction of sugars and starches and diet. Get back on regular use of metformin and short-term add Amaryl 2 mg once daily. Obtain daily fasting blood sugars and return 2 weeks and bring in log of readings  drink plenty of water to avoid dehydration. We'll discuss possible GLP-1 class on follow-up

## 2015-11-04 NOTE — Progress Notes (Signed)
Pre visit review using our clinic review tool, if applicable. No additional management support is needed unless otherwise documented below in the visit note. 

## 2015-11-08 ENCOUNTER — Telehealth: Payer: Self-pay | Admitting: Family Medicine

## 2015-11-08 NOTE — Telephone Encounter (Signed)
Fluconazole 150 mg po times one dose 

## 2015-11-08 NOTE — Telephone Encounter (Signed)
Pt call to say she has a yeast infection and said she mention it last week and think Dr Elease Hashimoto forgot to write her something. She calling today to ask for a rx   [place]

## 2015-11-09 MED ORDER — FLUCONAZOLE 150 MG PO TABS: 150.0000 mg | ORAL_TABLET | Freq: Once | ORAL | Status: DC

## 2015-11-09 NOTE — Telephone Encounter (Signed)
Medication has been sent in to the pharmacy

## 2015-11-13 ENCOUNTER — Other Ambulatory Visit: Payer: Self-pay | Admitting: Family Medicine

## 2015-11-17 ENCOUNTER — Other Ambulatory Visit: Payer: Self-pay | Admitting: Family Medicine

## 2015-11-19 ENCOUNTER — Ambulatory Visit: Payer: Medicare Other | Admitting: Family Medicine

## 2015-11-29 ENCOUNTER — Other Ambulatory Visit: Payer: Self-pay | Admitting: Family Medicine

## 2015-11-30 NOTE — Telephone Encounter (Signed)
This looks like your patient.

## 2015-12-02 ENCOUNTER — Telehealth: Payer: Self-pay

## 2015-12-02 NOTE — Telephone Encounter (Signed)
If she is having breakthrough symptoms with daily Nexium, I would recommend supplement with OTC Zantac or Pepcid.

## 2015-12-02 NOTE — Telephone Encounter (Signed)
Rx request for Nexium 40 mg tablet.  Some days pt is having to take 2 tablets.  Please advise if pt can changed directions to bid instead of once daily for insurance. Pls advise.

## 2015-12-03 NOTE — Telephone Encounter (Signed)
Tried calling pt with NA. See annotations Below.

## 2015-12-06 NOTE — Telephone Encounter (Signed)
Pt is aware of annotations.  

## 2015-12-09 ENCOUNTER — Telehealth: Payer: Self-pay | Admitting: Family Medicine

## 2015-12-09 DIAGNOSIS — K219 Gastro-esophageal reflux disease without esophagitis: Secondary | ICD-10-CM

## 2015-12-09 NOTE — Telephone Encounter (Signed)
Pt would like to be referred to a Gastroenterology for her stomach pain and esophagus burning

## 2015-12-09 NOTE — Telephone Encounter (Signed)
SX were discussed at visit on 11/04/15. No mention of a GI referral in the plan. Please advise.

## 2015-12-09 NOTE — Telephone Encounter (Signed)
Is she taking Nexium daily?  If so, and still has burning, would go ahead with GI referral.  Even if she is taking the Nexium, could add OTC Pepcid or Zantac once or twice daily as needed.

## 2015-12-10 DIAGNOSIS — E7800 Pure hypercholesterolemia, unspecified: Secondary | ICD-10-CM | POA: Diagnosis not present

## 2015-12-10 DIAGNOSIS — J449 Chronic obstructive pulmonary disease, unspecified: Secondary | ICD-10-CM | POA: Diagnosis not present

## 2015-12-10 DIAGNOSIS — R1013 Epigastric pain: Secondary | ICD-10-CM | POA: Diagnosis not present

## 2015-12-10 DIAGNOSIS — E119 Type 2 diabetes mellitus without complications: Secondary | ICD-10-CM | POA: Diagnosis not present

## 2015-12-10 DIAGNOSIS — F329 Major depressive disorder, single episode, unspecified: Secondary | ICD-10-CM | POA: Diagnosis not present

## 2015-12-10 DIAGNOSIS — K219 Gastro-esophageal reflux disease without esophagitis: Secondary | ICD-10-CM | POA: Diagnosis not present

## 2015-12-10 DIAGNOSIS — F419 Anxiety disorder, unspecified: Secondary | ICD-10-CM | POA: Diagnosis not present

## 2015-12-10 DIAGNOSIS — R079 Chest pain, unspecified: Secondary | ICD-10-CM | POA: Diagnosis not present

## 2015-12-10 DIAGNOSIS — Z79899 Other long term (current) drug therapy: Secondary | ICD-10-CM | POA: Diagnosis not present

## 2015-12-10 DIAGNOSIS — E039 Hypothyroidism, unspecified: Secondary | ICD-10-CM | POA: Diagnosis not present

## 2015-12-13 ENCOUNTER — Encounter: Payer: Self-pay | Admitting: Family Medicine

## 2015-12-13 DIAGNOSIS — K219 Gastro-esophageal reflux disease without esophagitis: Secondary | ICD-10-CM | POA: Insufficient documentation

## 2015-12-13 NOTE — Telephone Encounter (Signed)
Referral entered  

## 2015-12-13 NOTE — Telephone Encounter (Signed)
Pt went to the hospital over the weekend due to her abdominal pain and swelling. They placed her on medications but she did not specify. Once our office does TCM phone call we should have more information. The other suggestion for ER doctor was a GI referral like noted below. What specific DX do we use to order?

## 2015-12-13 NOTE — Telephone Encounter (Signed)
gerd 

## 2015-12-13 NOTE — Addendum Note (Signed)
Addended by: Elio Forget on: 12/13/2015 05:20 PM   Modules accepted: Orders

## 2015-12-24 ENCOUNTER — Telehealth: Payer: Self-pay | Admitting: Family Medicine

## 2015-12-24 MED ORDER — FLUCONAZOLE 150 MG PO TABS: 150.0000 mg | ORAL_TABLET | Freq: Once | ORAL | Status: DC

## 2015-12-24 NOTE — Telephone Encounter (Signed)
Fluconazole 150 mg by mouth  1 dose.

## 2015-12-24 NOTE — Telephone Encounter (Signed)
Medication sent in for patient. 

## 2015-12-24 NOTE — Telephone Encounter (Signed)
Pt has a yeast infection again and would like diflucan call into [facility]

## 2015-12-24 NOTE — Telephone Encounter (Signed)
Okay to fill? 

## 2015-12-30 ENCOUNTER — Ambulatory Visit (INDEPENDENT_AMBULATORY_CARE_PROVIDER_SITE_OTHER): Payer: Medicare Other | Admitting: Family Medicine

## 2015-12-30 VITALS — BP 110/88 | HR 98 | Temp 97.4°F | Ht 63.0 in | Wt 198.0 lb

## 2015-12-30 DIAGNOSIS — E1165 Type 2 diabetes mellitus with hyperglycemia: Secondary | ICD-10-CM | POA: Diagnosis not present

## 2015-12-30 DIAGNOSIS — B373 Candidiasis of vulva and vagina: Secondary | ICD-10-CM

## 2015-12-30 DIAGNOSIS — B3731 Acute candidiasis of vulva and vagina: Secondary | ICD-10-CM

## 2015-12-30 LAB — POCT GLUCOSE (DEVICE FOR HOME USE): Glucose Fasting, POC: 170 mg/dL — AB (ref 70–99)

## 2015-12-30 MED ORDER — DULAGLUTIDE 0.75 MG/0.5ML ~~LOC~~ SOAJ: 0.7500 mg | SUBCUTANEOUS | Status: DC

## 2015-12-30 MED ORDER — FLUCONAZOLE 100 MG PO TABS: 100.0000 mg | ORAL_TABLET | Freq: Every day | ORAL | Status: DC

## 2015-12-30 NOTE — Progress Notes (Signed)
Pre visit review using our clinic review tool, if applicable. No additional management support is needed unless otherwise documented below in the visit note. 

## 2015-12-30 NOTE — Patient Instructions (Signed)
HOLD the Atorvastatin while you are taking the Fluconazole. Go ahead and start the Trulicity.

## 2015-12-30 NOTE — Progress Notes (Signed)
   Subjective:    Patient ID: Yesenia Wilson, female    DOB: November 04, 1955, 60 y.o.   MRN: [id number]  HPI  Patient here for the following  Possible yeast vaginitis. She's had several weeks now of some whitish vaginal discharge with pruritus. She took a one time fluconazole 150 mg last week with minimal improvement. Has previously had to take up to 1 week course of fluconazole for resolution.   Recent poorly controlled type 2 diabetes. History of poor compliance. A1c 7.1% several months ago and this climbed quickly up to 11.5% in February.  She takes metformin and we added low-dose  Amaryl 2 mg once daily. Fasting blood sugars have been around 170 fairly consistently. We previously discussed possible use of GLP-1 class.   She has not a great candidate for as SGLT-2 class because of her history of recurring yeast vaginitis  Past Medical History  Diagnosis Date  . Hypertension   . Lupus ([place])   . Fibromyalgia   . Asthma   . Diabetes mellitus   . Hyperlipidemia   . Depression   . Thyroid disease   . Sleep apnea    Past Surgical History  Procedure Laterality Date  . Appendectomy    . Cholecystectomy    . Cesarean section    . Bladder tack    . Septoplasty      reports that she has never smoked. She has never used smokeless tobacco. She reports that she drinks alcohol. She reports that she does not use illicit drugs. family history is not on file. She was adopted. Allergies  Allergen Reactions  . Oxycodone-Acetaminophen     REACTION: GI upset  . Propoxyphene N-Acetaminophen     REACTION: nausea, rapid heartrate      Review of Systems  Constitutional: Negative for fever, chills and fatigue.  Eyes: Negative for visual disturbance.  Respiratory: Negative for cough, chest tightness, shortness of breath and wheezing.   Cardiovascular: Negative for chest pain, palpitations and leg swelling.  Endocrine: Negative for polydipsia and polyuria.  Genitourinary: Positive for vaginal  discharge. Negative for dysuria.  Neurological: Negative for dizziness, seizures, syncope, weakness, light-headedness and headaches.       Objective:   Physical Exam  Constitutional: She appears well-developed and well-nourished.  Neck: Neck supple. No thyromegaly present.  Cardiovascular: Normal rate and regular rhythm.   Pulmonary/Chest: Effort normal and breath sounds normal. No respiratory distress. She has no wheezes. She has no rales.  Musculoskeletal: She exhibits no edema.  Lymphadenopathy:    She has no cervical adenopathy.  Psychiatric: She has a normal mood and affect. Her behavior is normal.          Assessment & Plan:   #1 recurring yeast vaginitis. Increased risk because of poorly controlled diabetes. Fluconazole 100 mg daily for 1 week   #2 Poorly controlled type 2 diabetes. 6 hour postprandial blood sugar today 170. Recent weight gain.  Continue metformin. Add trulicity [id number] mg subcutaneous once weekly- if we can get this covered. We'll plan follow-up in 2 months and repeat A1c then. We reviewed potential side effects.

## 2016-01-03 ENCOUNTER — Other Ambulatory Visit: Payer: Self-pay | Admitting: Family Medicine

## 2016-01-19 ENCOUNTER — Other Ambulatory Visit: Payer: Self-pay | Admitting: Family Medicine

## 2016-01-28 ENCOUNTER — Telehealth: Payer: Self-pay | Admitting: Family Medicine

## 2016-01-28 NOTE — Telephone Encounter (Signed)
Pt has poison ivy on face, neck  and arms. [facility] . Pt decline an appt

## 2016-01-28 NOTE — Telephone Encounter (Signed)
Please advise. Need to be seen?

## 2016-01-28 NOTE — Telephone Encounter (Signed)
Needs to be seen

## 2016-01-28 NOTE — Telephone Encounter (Signed)
Pt has been sch pt req monday

## 2016-01-31 ENCOUNTER — Ambulatory Visit (INDEPENDENT_AMBULATORY_CARE_PROVIDER_SITE_OTHER): Payer: Medicare Other | Admitting: Family Medicine

## 2016-01-31 VITALS — BP 122/90 | HR 94 | Temp 97.6°F | Ht 63.0 in | Wt 197.0 lb

## 2016-01-31 DIAGNOSIS — L719 Rosacea, unspecified: Secondary | ICD-10-CM

## 2016-01-31 DIAGNOSIS — E1165 Type 2 diabetes mellitus with hyperglycemia: Secondary | ICD-10-CM | POA: Diagnosis not present

## 2016-01-31 DIAGNOSIS — L237 Allergic contact dermatitis due to plants, except food: Secondary | ICD-10-CM

## 2016-01-31 MED ORDER — DOXYCYCLINE HYCLATE 100 MG PO CAPS: 100.0000 mg | ORAL_CAPSULE | Freq: Two times a day (BID) | ORAL | Status: DC

## 2016-01-31 MED ORDER — METHYLPREDNISOLONE ACETATE 80 MG/ML IJ SUSP
80.0000 mg | Freq: Once | INTRAMUSCULAR | Status: AC
  Administered 2016-01-31: 80 mg via INTRAMUSCULAR

## 2016-01-31 NOTE — Progress Notes (Signed)
   Subjective:    Patient ID: Yesenia Wilson, female    DOB: 05-10-1956, 60 y.o.   MRN: [id number]  HPI Patient seen with 2 different skin rashes.  First, she has facial rash which has been present for several weeks. Erythematous with small pustules. Location is mostly cheeks but occasionally forehead and nose She states she has a brother and sister who have history of rosacea. She has never been diagnosed with this. No alleviating factors. Rash is worse with heat and possibly alcohol  Separate rash is present more acutely for the past week Location is right neck, left ring finger, waist, and right posterior shoulder She's done some work outside recently. This rash is pruritic. No fever or chills.  Type 2 diabetes. History of recent poor control. Started Trulicity [id number] mg once weekly. Tolerating well no side effects. Fasting blood sugars recently have improved below 130 consistently thus far weight is only down 1 pound but she just started this a month ago. No adverse side effects  Past Medical History  Diagnosis Date  . Hypertension   . Lupus ([place])   . Fibromyalgia   . Asthma   . Diabetes mellitus   . Hyperlipidemia   . Depression   . Thyroid disease   . Sleep apnea    Past Surgical History  Procedure Laterality Date  . Appendectomy    . Cholecystectomy    . Cesarean section    . Bladder tack    . Septoplasty      reports that she has never smoked. She has never used smokeless tobacco. She reports that she drinks alcohol. She reports that she does not use illicit drugs. family history is not on file. She was adopted. Allergies  Allergen Reactions  . Oxycodone-Acetaminophen     REACTION: GI upset  . Propoxyphene N-Acetaminophen     REACTION: nausea, rapid heartrate      Review of Systems  Constitutional: Negative for fever and chills.  Respiratory: Negative for shortness of breath.   Cardiovascular: Negative for chest pain.  Skin: Positive for rash.    Hematological: Negative for adenopathy.       Objective:   Physical Exam  Constitutional: She appears well-developed and well-nourished.  Cardiovascular: Normal rate and regular rhythm.   Pulmonary/Chest: Effort normal and breath sounds normal. No respiratory distress. She has no wheezes. She has no rales.  Skin: Rash noted.  She has 2 separate rashes. Rash on her right neck left ring finger are erythematous and slightly raised and nontender and somewhat vesicular  Facial rash is erythematous with small erythematous papules and a few scattered pustules. No vesicles          Assessment & Plan:  #1 contact dermatitis. Widespread involvement. No relief with topical steroids. Depo-Medrol 80 mg IM. Monitor blood sugars closely.  #2 facial rash. Suspect rosacea. Doxycycline 100 mg twice daily and reassess 2 weeks. Consider transition to topical (metrogel) at that point if improving  #3 type 2 diabetes. Recent poor control. Recent addition of GLP-1-1 medication. Tolerating well. Blood sugars improving.  Hopefully, can achieve additional weight loss over time.  Eulas Post MD [facility]

## 2016-01-31 NOTE — Patient Instructions (Signed)
Contact Dermatitis Dermatitis is redness, soreness, and swelling (inflammation) of the skin. Contact dermatitis is a reaction to certain substances that touch the skin. There are two types of contact dermatitis:   Irritant contact dermatitis. This type is caused by something that irritates your skin, such as dry hands from washing them too much. This type does not require previous exposure to the substance for a reaction to occur. This type is more common.  Allergic contact dermatitis. This type is caused by a substance that you are allergic to, such as a nickel allergy or poison ivy. This type only occurs if you have been exposed to the substance (allergen) before. Upon a repeat exposure, your body reacts to the substance. This type is less common. CAUSES  Many different substances can cause contact dermatitis. Irritant contact dermatitis is most commonly caused by exposure to:   Makeup.   Soaps.   Detergents.   Bleaches.   Acids.   Metal salts, such as nickel.  Allergic contact dermatitis is most commonly caused by exposure to:   Poisonous plants.   Chemicals.   Jewelry.   Latex.   Medicines.   Preservatives in products, such as clothing.  RISK FACTORS This condition is more likely to develop in:   People who have jobs that expose them to irritants or allergens.  People who have certain medical conditions, such as asthma or eczema.  SYMPTOMS  Symptoms of this condition may occur anywhere on your body where the irritant has touched you or is touched by you. Symptoms include:  Dryness or flaking.   Redness.   Cracks.   Itching.   Pain or a burning feeling.   Blisters.  Drainage of small amounts of blood or clear fluid from skin cracks. With allergic contact dermatitis, there may also be swelling in areas such as the eyelids, mouth, or genitals.  DIAGNOSIS  This condition is diagnosed with a medical history and physical exam. A patch skin test  may be performed to help determine the cause. If the condition is related to your job, you may need to see an occupational medicine specialist. TREATMENT Treatment for this condition includes figuring out what caused the reaction and protecting your skin from further contact. Treatment may also include:   Steroid creams or ointments. Oral steroid medicines may be needed in more severe cases.  Antibiotics or antibacterial ointments, if a skin infection is present.  Antihistamine lotion or an antihistamine taken by mouth to ease itching.  A bandage (dressing). HOME CARE INSTRUCTIONS Skin Care  Moisturize your skin as needed.   Apply cool compresses to the affected areas.  Try taking a bath with:  Epsom salts. Follow the instructions on the packaging. You can get these at your local pharmacy or grocery store.  Baking soda. Pour a small amount into the bath as directed by your health care provider.  Colloidal oatmeal. Follow the instructions on the packaging. You can get this at your local pharmacy or grocery store.  Try applying baking soda paste to your skin. Stir water into baking soda until it reaches a paste-like consistency.  Do not scratch your skin.  Bathe less frequently, such as every other day.  Bathe in lukewarm water. Avoid using hot water. Medicines  Take or apply over-the-counter and prescription medicines only as told by your health care provider.   If you were prescribed an antibiotic medicine, take or apply your antibiotic as told by your health care provider. Do not stop using the   antibiotic even if your condition starts to improve. General Instructions  Keep all follow-up visits as told by your health care provider. This is important.  Avoid the substance that caused your reaction. If you do not know what caused it, keep a journal to try to track what caused it. Write down:  What you eat.  What cosmetic products you use.  What you drink.  What  you wear in the affected area. This includes jewelry.  If you were given a dressing, take care of it as told by your health care provider. This includes when to change and remove it. SEEK MEDICAL CARE IF:   Your condition does not improve with treatment.  Your condition gets worse.  You have signs of infection such as swelling, tenderness, redness, soreness, or warmth in the affected area.  You have a fever.  You have new symptoms. SEEK IMMEDIATE MEDICAL CARE IF:   You have a severe headache, neck pain, or neck stiffness.  You vomit.  You feel very sleepy.  You notice red streaks coming from the affected area.  Your bone or joint underneath the affected area becomes painful after the skin has healed.  The affected area turns darker.  You have difficulty breathing.   This information is not intended to replace advice given to you by your health care provider. Make sure you discuss any questions you have with your health care provider.   Document Released: 09/01/2000 Document Revised: 05/26/2015 Document Reviewed: 01/20/2015 Elsevier Interactive Patient Education 2016 Elsevier Inc.  

## 2016-01-31 NOTE — Progress Notes (Signed)
Pre visit review using our clinic review tool, if applicable. No additional management support is needed unless otherwise documented below in the visit note. 

## 2016-02-15 ENCOUNTER — Telehealth: Payer: Self-pay | Admitting: Family Medicine

## 2016-02-15 ENCOUNTER — Ambulatory Visit (INDEPENDENT_AMBULATORY_CARE_PROVIDER_SITE_OTHER): Payer: Medicare Other | Admitting: Family Medicine

## 2016-02-15 DIAGNOSIS — E038 Other specified hypothyroidism: Secondary | ICD-10-CM

## 2016-02-15 DIAGNOSIS — E1165 Type 2 diabetes mellitus with hyperglycemia: Secondary | ICD-10-CM | POA: Diagnosis not present

## 2016-02-15 DIAGNOSIS — L719 Rosacea, unspecified: Secondary | ICD-10-CM

## 2016-02-15 LAB — TSH: TSH: 7.42 u[IU]/mL — ABNORMAL HIGH (ref 0.35–4.50)

## 2016-02-15 LAB — HEMOGLOBIN A1C: Hgb A1c MFr Bld: 10.5 % — ABNORMAL HIGH (ref 4.6–6.5)

## 2016-02-15 MED ORDER — METRONIDAZOLE 1 % EX GEL: Freq: Every day | CUTANEOUS | Status: DC

## 2016-02-15 NOTE — Telephone Encounter (Signed)
Pharmacy called to advise pt's metroNIDAZOLE (METROGEL) 1 % gel  is not covered by insurance.  But they will cover  metroNIDAZOLE (METROGEL)  .075 % gel  [facility] [address]

## 2016-02-15 NOTE — Progress Notes (Signed)
   Subjective:    Patient ID: Yesenia Wilson, female    DOB: 1956-08-21, 60 y.o.   MRN: [id number]  HPI Follow-up several items  Recent suspected rosacea. We treated with doxycycline and rash is much better Still has several telangiectasias on her face and occasional pustules. She states brother and sister have been diagnosed with rosacea.  Type 2 diabetes. Recent poor control. A1c 11.5% back in February. We started Trulicity [id number] mg subcutaneous once weekly and she is tolerating with no side effects. Not monitoring blood sugars regularly. No polyuria or polydipsia.  Hypothyroidism. Overdue for TSH. Compliant with therapy.  Past Medical History  Diagnosis Date  . Hypertension   . Lupus ([place])   . Fibromyalgia   . Asthma   . Diabetes mellitus   . Hyperlipidemia   . Depression   . Thyroid disease   . Sleep apnea    Past Surgical History  Procedure Laterality Date  . Appendectomy    . Cholecystectomy    . Cesarean section    . Bladder tack    . Septoplasty      reports that she has never smoked. She has never used smokeless tobacco. She reports that she drinks alcohol. She reports that she does not use illicit drugs. family history is not on file. She was adopted. Allergies  Allergen Reactions  . Oxycodone-Acetaminophen     REACTION: GI upset  . Propoxyphene N-Acetaminophen     REACTION: nausea, rapid heartrate      Review of Systems  Constitutional: Negative for fatigue and unexpected weight change.  Eyes: Negative for visual disturbance.  Respiratory: Negative for cough, chest tightness, shortness of breath and wheezing.   Cardiovascular: Negative for chest pain, palpitations and leg swelling.  Endocrine: Negative for polydipsia and polyuria.  Genitourinary: Negative for dysuria.  Neurological: Negative for dizziness, seizures, syncope, weakness, light-headedness and headaches.       Objective:   Physical Exam  Constitutional: She appears well-developed and  well-nourished. No distress.  Neck: Neck supple. No thyromegaly present.  Cardiovascular: Normal rate and regular rhythm.   No murmur heard. Pulmonary/Chest: Effort normal and breath sounds normal. No respiratory distress. She has no wheezes. She has no rales.  Musculoskeletal: She exhibits no edema.  Skin:  She still has some prominent telangiectasias on her face cheek area bilaterally and a couple small pustules but overall much improved compared to 2 weeks ago          Assessment & Plan:  #1 rosacea. Metro gel topical apply once daily. Handout on rosacea given  #2 hypothyroidism. Recheck TSH  #3 type 2 diabetes. History of recent poor control. Recent addition of GLP-1  medication. Recheck A1c today. Continue weight loss efforts. Routine follow-up 3 months.  Eulas Post MD [facility] at [facility]

## 2016-02-15 NOTE — Progress Notes (Signed)
Pre visit review using our clinic review tool, if applicable. No additional management support is needed unless otherwise documented below in the visit note. 

## 2016-02-15 NOTE — Telephone Encounter (Signed)
yes. Okay to switch

## 2016-02-15 NOTE — Patient Instructions (Signed)
Rosacea Rosacea is a long-term (chronic) condition that affects the skin of the face, including the cheeks, nose, brow, and chin. This condition can also affect the eyes. Rosacea causes blood vessels near the surface of the skin to enlarge, which results in redness. CAUSES The cause of this condition is not known. Certain triggers can make rosacea worse, including:  Hot baths.  Exercise.  Sunlight.  Very hot or cold temperatures.  Hot or spicy foods and drinks.  Drinking alcohol.  Stress.  Taking blood pressure medicine.  Long-term use of topical steroids on the face. RISK FACTORS This condition is more likely to develop in:  People who are older than 62 years of age.  Women.  People who have light-colored skin (light complexion).  People who have a family history of rosacea. SYMPTOMS  Symptoms of this condition include:  Redness of the face.  Red bumps or pimples on the face.  A red, enlarged nose.  Blushing easily.  Red lines on the skin.  Irritated or burning feeling in the eyes.  Swollen eyelids. DIAGNOSIS This condition is diagnosed with a medical history and physical exam. TREATMENT There is no cure for this condition, but treatment can help to control your symptoms. Your health care provider may recommend that you see a skin specialist (dermatologist). Treatment may include:  Antibiotic medicines that are applied to the skin or taken as a pill.  Laser treatment to improve the appearance of the skin.  Surgery. This is rare. Your health care provider will also recommend the best way to take care of your skin. Even after your skin improves, you will likely need to continue treatment to prevent your rosacea from coming back. HOME CARE INSTRUCTIONS Skin Care Take care of your skin as told by your health care provider. You may be told to do these things:  Wash your skin gently two or more times each day.  Use mild soap.  Use a sunscreen or sunblock  with SPF 30 or greater.  Use gentle cosmetics that are meant for sensitive skin.  Shave with an electric shaver instead of a blade. Lifestyle  Try to keep track of what foods trigger this condition. Avoid any triggers. These may include:  Spicy foods.  Seafood.  Cheese.  Hot liquids.  Nuts.  Chocolate.  Iodized salt.  Do not drink alcohol.  Avoid extremely cold or hot temperatures.  Try to reduce your stress. If you need help, talk with your health care provider.  When you exercise, do these things to stay cool:  Limit your sun exposure.  Use a fan.  Do shorter and more frequent intervals of exercise. General Instructions   Keep all follow-up visits as told by your health care provider. This is important.  Take over-the-counter and prescription medicines only as told by your health care provider.  If your eyelids are affected, apply warm compresses to them. Do this as told by your health care provider.  If you were prescribed an antibiotic medicine, apply or take it as told by your health care provider. Do not stop using the antibiotic even if your condition improves. SEEK MEDICAL CARE IF:  Your symptoms get worse.  Your symptoms do not improve after two months of treatment.  You have new symptoms.  You have any changes in vision or you have problems with your eyes, such as redness or itching.  You feel depressed.  You lose your appetite.  You have trouble concentrating.   This information is not   intended to replace advice given to you by your health care provider. Make sure you discuss any questions you have with your health care provider.   Document Released: 10/12/2004 Document Revised: 05/26/2015 Document Reviewed: 11/11/2014 Elsevier Interactive Patient Education [employer].

## 2016-02-15 NOTE — Telephone Encounter (Signed)
Okay for change? °

## 2016-02-16 MED ORDER — METRONIDAZOLE 0.75 % VA GEL: VAGINAL | Status: DC

## 2016-02-16 MED ORDER — SYNTHROID 125 MCG PO TABS: 125.0000 ug | ORAL_TABLET | Freq: Every day | ORAL | Status: DC

## 2016-02-16 MED ORDER — LEVOTHYROXINE SODIUM 125 MCG PO TABS: 125.0000 ug | ORAL_TABLET | Freq: Every day | ORAL | Status: DC

## 2016-02-16 NOTE — Addendum Note (Signed)
Addended by: Elio Forget on: 02/16/2016 08:44 AM   Modules accepted: Orders, Medications

## 2016-02-16 NOTE — Addendum Note (Signed)
Addended by: Elio Forget on: 02/16/2016 08:49 AM   Modules accepted: Orders

## 2016-02-16 NOTE — Telephone Encounter (Signed)
Pharmacy is aware of switch. Medication list is UTD.

## 2016-02-23 ENCOUNTER — Telehealth: Payer: Self-pay | Admitting: Family Medicine

## 2016-02-23 MED ORDER — FLUCONAZOLE 150 MG PO TABS: ORAL_TABLET | ORAL | Status: DC

## 2016-02-23 NOTE — Telephone Encounter (Signed)
Pt call to say she has a yeast infection and asked if Dr Elease Hashimoto will call her in something    [facility] [place]

## 2016-02-23 NOTE — Telephone Encounter (Signed)
Medication sent in for patient. 

## 2016-02-23 NOTE — Telephone Encounter (Signed)
Fluconazole 150mg #1 dose

## 2016-02-23 NOTE — Addendum Note (Signed)
Addended by: Elio Forget on: 02/23/2016 10:47 AM   Modules accepted: Orders

## 2016-03-01 ENCOUNTER — Ambulatory Visit: Payer: Medicare Other | Admitting: Family Medicine

## 2016-03-01 DIAGNOSIS — H40033 Anatomical narrow angle, bilateral: Secondary | ICD-10-CM | POA: Diagnosis not present

## 2016-03-01 DIAGNOSIS — E119 Type 2 diabetes mellitus without complications: Secondary | ICD-10-CM | POA: Diagnosis not present

## 2016-03-17 ENCOUNTER — Other Ambulatory Visit: Payer: Self-pay

## 2016-03-17 MED ORDER — IRBESARTAN-HYDROCHLOROTHIAZIDE 300-12.5 MG PO TABS: 1.0000 | ORAL_TABLET | Freq: Every day | ORAL | Status: DC

## 2016-03-22 ENCOUNTER — Encounter: Payer: Self-pay | Admitting: Family Medicine

## 2016-03-22 ENCOUNTER — Ambulatory Visit (INDEPENDENT_AMBULATORY_CARE_PROVIDER_SITE_OTHER): Payer: Medicare Other | Admitting: Family Medicine

## 2016-03-22 VITALS — BP 122/80 | HR 102 | Temp 98.1°F | Ht 63.0 in | Wt 192.7 lb

## 2016-03-22 DIAGNOSIS — E038 Other specified hypothyroidism: Secondary | ICD-10-CM | POA: Diagnosis not present

## 2016-03-22 DIAGNOSIS — E1165 Type 2 diabetes mellitus with hyperglycemia: Secondary | ICD-10-CM

## 2016-03-22 DIAGNOSIS — R42 Dizziness and giddiness: Secondary | ICD-10-CM | POA: Diagnosis not present

## 2016-03-22 DIAGNOSIS — N3941 Urge incontinence: Secondary | ICD-10-CM | POA: Diagnosis not present

## 2016-03-22 DIAGNOSIS — H538 Other visual disturbances: Secondary | ICD-10-CM | POA: Diagnosis not present

## 2016-03-22 DIAGNOSIS — E119 Type 2 diabetes mellitus without complications: Secondary | ICD-10-CM | POA: Diagnosis not present

## 2016-03-22 DIAGNOSIS — Z7984 Long term (current) use of oral hypoglycemic drugs: Secondary | ICD-10-CM | POA: Diagnosis not present

## 2016-03-22 DIAGNOSIS — N390 Urinary tract infection, site not specified: Secondary | ICD-10-CM | POA: Diagnosis not present

## 2016-03-22 DIAGNOSIS — R3915 Urgency of urination: Secondary | ICD-10-CM | POA: Diagnosis not present

## 2016-03-22 DIAGNOSIS — R109 Unspecified abdominal pain: Secondary | ICD-10-CM | POA: Diagnosis not present

## 2016-03-22 DIAGNOSIS — R1030 Lower abdominal pain, unspecified: Secondary | ICD-10-CM | POA: Diagnosis not present

## 2016-03-22 DIAGNOSIS — R51 Headache: Secondary | ICD-10-CM | POA: Diagnosis not present

## 2016-03-22 DIAGNOSIS — Z79899 Other long term (current) drug therapy: Secondary | ICD-10-CM | POA: Diagnosis not present

## 2016-03-22 DIAGNOSIS — R110 Nausea: Secondary | ICD-10-CM | POA: Diagnosis not present

## 2016-03-22 LAB — POCT URINALYSIS DIP (MANUAL ENTRY)
BILIRUBIN UA: NEGATIVE
Bilirubin, UA: NEGATIVE
Glucose, UA: >=1000 — AB
LEUKOCYTES UA: NEGATIVE
Nitrite, UA: NEGATIVE
PH UA: 6
PROTEIN UA: NEGATIVE
RBC UA: NEGATIVE
SPEC GRAV UA: 1.025
Urobilinogen, UA: 0.2

## 2016-03-22 MED ORDER — SOLIFENACIN SUCCINATE 5 MG PO TABS: 5.0000 mg | ORAL_TABLET | Freq: Every day | ORAL | Status: DC

## 2016-03-22 NOTE — Progress Notes (Signed)
Pre visit review using our clinic review tool, if applicable. No additional management support is needed unless otherwise documented below in the visit note. 

## 2016-03-22 NOTE — Patient Instructions (Signed)
-  avoid drinking many fluids after 7 pm - gradually reduce caffeine intake -may use non-caffeinated tea and colas -alcohol in moderation and especially avoid late at night.

## 2016-03-22 NOTE — Progress Notes (Signed)
   Subjective:    Patient ID: Yesenia Wilson, female    DOB: 1956/07/05, 60 y.o.   MRN: [id number]  HPI  Patient seen with chief complaint of increased urine frequency . She initially complained of " leakage "of urine.  On further questioning she really is describing some urinary urgency.  No stress incontinence.  No stool incontinence. No low back pain.  Nocturia- sometimes 4-5 times per night.  Does drink sometimes caffeinated sugar-free beverages (colas) and caffeinated tea. She is recently also consumed occasional beer at night.  Denies any burning with urination. No hematuria. Urine frequency is really starting to disrupt sleep Increased fatigue which she thinks is related  Does have hypothyroidism and recent TSH up with change in medication dose.    Type 2 diabetes. Recent poor control. Recent A1c over  [id number]  We started trulicity and she is only taking this for about a month.  She plans to lose some weight. Not monitoring blood sugars regularly at home.  She denies any increased thirst.  Past Medical History  Diagnosis Date  . Hypertension   . Lupus ([place])   . Fibromyalgia   . Asthma   . Diabetes mellitus   . Hyperlipidemia   . Depression   . Thyroid disease   . Sleep apnea    Past Surgical History  Procedure Laterality Date  . Appendectomy    . Cholecystectomy    . Cesarean section    . Bladder tack    . Septoplasty      reports that she has never smoked. She has never used smokeless tobacco. She reports that she drinks alcohol. She reports that she does not use illicit drugs. family history is not on file. She was adopted. Allergies  Allergen Reactions  . Oxycodone-Acetaminophen     REACTION: GI upset  . Propoxyphene N-Acetaminophen     REACTION: nausea, rapid heartrate      Review of Systems  Constitutional: Negative for fever and chills.  Respiratory: Negative for shortness of breath.   Cardiovascular: Negative for chest pain.  Gastrointestinal:  Negative for abdominal pain.  Genitourinary: Positive for urgency and frequency. Negative for hematuria, decreased urine volume and difficulty urinating.       Objective:   Physical Exam  Constitutional: She appears well-developed and well-nourished.  HENT:  Mouth/Throat: Oropharynx is clear and moist.  Neck: Neck supple.  Cardiovascular: Normal rate and regular rhythm.   Pulmonary/Chest: Effort normal and breath sounds normal. No respiratory distress. She has no wheezes. She has no rales.  Musculoskeletal: She exhibits no edema.          Assessment & Plan:   #1 urinary urgency. Urine dip= glucose and o/w clear.  We recommend she try to reduce caffeinated beverages also avoid alcohol at night. Also avoid drinking after about 7 PM. Trial of Vesicare 5 mg daily at bedtime. She has no contraindications. She is not describing any stress incontinence symptoms. Reassess in 2 months   #2 type 2 diabetes. Recent poor control. Strongly advise weight loss. Just started GLP-1  medication as above. Hopefully we'll see some big improvements and A1c at follow-up in 2 months  #3 Hypothyroid- recent increase in dose and repeat TSH in 2 months.  Eulas Post MD [facility] at [facility]

## 2016-03-24 ENCOUNTER — Other Ambulatory Visit: Payer: Self-pay | Admitting: *Deleted

## 2016-03-24 MED ORDER — DULAGLUTIDE 0.75 MG/0.5ML ~~LOC~~ SOAJ: 0.7500 mg | SUBCUTANEOUS | Status: DC

## 2016-03-31 ENCOUNTER — Telehealth: Payer: Self-pay

## 2016-03-31 NOTE — Telephone Encounter (Signed)
Please advise if we need to get pt in sooner. Pending appt 05/26/2016.

## 2016-04-02 NOTE — Telephone Encounter (Signed)
I do not feel we need to get in her before September visit -unless her symptoms worsen.

## 2016-04-03 NOTE — Telephone Encounter (Signed)
Do you know if this patient is having worsening sx?

## 2016-04-20 ENCOUNTER — Other Ambulatory Visit: Payer: Self-pay | Admitting: Family Medicine

## 2016-04-20 NOTE — Telephone Encounter (Signed)
Recommend against use.  She is on serotonin drug with Cymbalta and combination use can lead to Serotonin Syndrome.  If not already doing, try OTC Tylenol as first step for arthritis pain.

## 2016-04-21 NOTE — Telephone Encounter (Signed)
I left a message for the pt to return my call. 

## 2016-04-21 NOTE — Telephone Encounter (Signed)
Patient called back and I informed her of the message below. She stated she has leg pain from a surgery she had and Tylenol has not helped her pain at all and she questioned if another medication could be given?

## 2016-04-23 NOTE — Telephone Encounter (Signed)
I would offer her follow up to discuss/re-assess.

## 2016-04-24 NOTE — Telephone Encounter (Signed)
I called the pt and informed her of the message below.  I offered to schedule an appt and she stated she did not want to make an appt and this time and would wait.

## 2016-04-26 DIAGNOSIS — M5116 Intervertebral disc disorders with radiculopathy, lumbar region: Secondary | ICD-10-CM | POA: Diagnosis not present

## 2016-05-02 DIAGNOSIS — M5116 Intervertebral disc disorders with radiculopathy, lumbar region: Secondary | ICD-10-CM | POA: Diagnosis not present

## 2016-05-02 DIAGNOSIS — M5126 Other intervertebral disc displacement, lumbar region: Secondary | ICD-10-CM | POA: Diagnosis not present

## 2016-05-02 DIAGNOSIS — M5127 Other intervertebral disc displacement, lumbosacral region: Secondary | ICD-10-CM | POA: Diagnosis not present

## 2016-05-04 DIAGNOSIS — M5116 Intervertebral disc disorders with radiculopathy, lumbar region: Secondary | ICD-10-CM | POA: Diagnosis not present

## 2016-05-15 ENCOUNTER — Telehealth: Payer: Self-pay | Admitting: Family Medicine

## 2016-05-15 NOTE — Telephone Encounter (Signed)
Please advise 

## 2016-05-15 NOTE — Telephone Encounter (Signed)
Diflucan 150 mg po times one dose.

## 2016-05-15 NOTE — Telephone Encounter (Signed)
Pt has another yeast infection and would like diflucan  [facility]

## 2016-05-16 MED ORDER — FLUCONAZOLE 150 MG PO TABS
150.0000 mg | ORAL_TABLET | Freq: Once | ORAL | 0 refills | Status: AC
Start: 2016-05-16 — End: 2016-05-16

## 2016-05-16 NOTE — Telephone Encounter (Signed)
Sent Diflucan in and informed patient.

## 2016-05-23 ENCOUNTER — Ambulatory Visit: Payer: Medicare Other | Admitting: Family Medicine

## 2016-05-23 ENCOUNTER — Other Ambulatory Visit: Payer: Self-pay | Admitting: *Deleted

## 2016-05-23 MED ORDER — ESTRADIOL 0.5 MG PO TABS: ORAL_TABLET | ORAL | 1 refills | Status: DC

## 2016-05-24 ENCOUNTER — Other Ambulatory Visit: Payer: Medicare Other

## 2016-05-26 ENCOUNTER — Ambulatory Visit: Payer: Medicare Other | Admitting: Family Medicine

## 2016-06-01 ENCOUNTER — Other Ambulatory Visit: Payer: Medicare Other

## 2016-06-02 ENCOUNTER — Ambulatory Visit: Payer: Medicare Other | Admitting: Family Medicine

## 2016-06-08 ENCOUNTER — Other Ambulatory Visit: Payer: Medicare Other

## 2016-06-09 ENCOUNTER — Ambulatory Visit: Payer: Medicare Other | Admitting: Family Medicine

## 2016-06-10 ENCOUNTER — Other Ambulatory Visit: Payer: Self-pay | Admitting: Family Medicine

## 2016-06-18 DIAGNOSIS — E7800 Pure hypercholesterolemia, unspecified: Secondary | ICD-10-CM | POA: Diagnosis not present

## 2016-06-18 DIAGNOSIS — J449 Chronic obstructive pulmonary disease, unspecified: Secondary | ICD-10-CM | POA: Diagnosis not present

## 2016-06-18 DIAGNOSIS — E0865 Diabetes mellitus due to underlying condition with hyperglycemia: Secondary | ICD-10-CM | POA: Diagnosis not present

## 2016-06-18 DIAGNOSIS — F329 Major depressive disorder, single episode, unspecified: Secondary | ICD-10-CM | POA: Diagnosis not present

## 2016-06-18 DIAGNOSIS — N760 Acute vaginitis: Secondary | ICD-10-CM | POA: Diagnosis not present

## 2016-06-18 DIAGNOSIS — E039 Hypothyroidism, unspecified: Secondary | ICD-10-CM | POA: Diagnosis not present

## 2016-06-18 DIAGNOSIS — K219 Gastro-esophageal reflux disease without esophagitis: Secondary | ICD-10-CM | POA: Diagnosis not present

## 2016-06-18 DIAGNOSIS — Z79899 Other long term (current) drug therapy: Secondary | ICD-10-CM | POA: Diagnosis not present

## 2016-06-18 DIAGNOSIS — N390 Urinary tract infection, site not specified: Secondary | ICD-10-CM | POA: Diagnosis not present

## 2016-06-18 DIAGNOSIS — E08618 Diabetes mellitus due to underlying condition with other diabetic arthropathy: Secondary | ICD-10-CM | POA: Diagnosis not present

## 2016-06-18 DIAGNOSIS — Z7984 Long term (current) use of oral hypoglycemic drugs: Secondary | ICD-10-CM | POA: Diagnosis not present

## 2016-06-22 ENCOUNTER — Telehealth: Payer: Self-pay | Admitting: Family Medicine

## 2016-06-22 DIAGNOSIS — E1165 Type 2 diabetes mellitus with hyperglycemia: Secondary | ICD-10-CM

## 2016-06-22 DIAGNOSIS — IMO0002 Reserved for concepts with insufficient information to code with codable children: Secondary | ICD-10-CM

## 2016-06-22 DIAGNOSIS — E118 Type 2 diabetes mellitus with unspecified complications: Principal | ICD-10-CM

## 2016-06-22 NOTE — Telephone Encounter (Signed)
Okay to place referral

## 2016-06-22 NOTE — Telephone Encounter (Signed)
We can refer- though she has not really followed up consistently here regarding her diabetes.

## 2016-06-22 NOTE — Telephone Encounter (Signed)
Pt would like to have a referral to see an Endocrinology.  Derinda Late     [address], [id number] [place], [place] [address]

## 2016-06-26 DIAGNOSIS — I10 Essential (primary) hypertension: Secondary | ICD-10-CM | POA: Diagnosis not present

## 2016-06-26 DIAGNOSIS — Z7984 Long term (current) use of oral hypoglycemic drugs: Secondary | ICD-10-CM | POA: Diagnosis not present

## 2016-06-26 DIAGNOSIS — E1165 Type 2 diabetes mellitus with hyperglycemia: Secondary | ICD-10-CM | POA: Diagnosis not present

## 2016-06-26 DIAGNOSIS — R7989 Other specified abnormal findings of blood chemistry: Secondary | ICD-10-CM | POA: Diagnosis present

## 2016-06-26 DIAGNOSIS — R109 Unspecified abdominal pain: Secondary | ICD-10-CM | POA: Diagnosis not present

## 2016-06-26 DIAGNOSIS — E871 Hypo-osmolality and hyponatremia: Secondary | ICD-10-CM | POA: Diagnosis not present

## 2016-06-26 DIAGNOSIS — K859 Acute pancreatitis without necrosis or infection, unspecified: Secondary | ICD-10-CM | POA: Diagnosis not present

## 2016-06-26 DIAGNOSIS — Z79899 Other long term (current) drug therapy: Secondary | ICD-10-CM | POA: Diagnosis not present

## 2016-06-26 DIAGNOSIS — I959 Hypotension, unspecified: Secondary | ICD-10-CM | POA: Diagnosis not present

## 2016-06-26 DIAGNOSIS — B9689 Other specified bacterial agents as the cause of diseases classified elsewhere: Secondary | ICD-10-CM | POA: Diagnosis not present

## 2016-06-26 DIAGNOSIS — R112 Nausea with vomiting, unspecified: Secondary | ICD-10-CM | POA: Diagnosis not present

## 2016-06-26 DIAGNOSIS — K219 Gastro-esophageal reflux disease without esophagitis: Secondary | ICD-10-CM | POA: Diagnosis present

## 2016-06-26 DIAGNOSIS — Z87440 Personal history of urinary (tract) infections: Secondary | ICD-10-CM | POA: Diagnosis not present

## 2016-06-26 DIAGNOSIS — J449 Chronic obstructive pulmonary disease, unspecified: Secondary | ICD-10-CM | POA: Diagnosis present

## 2016-06-26 DIAGNOSIS — R1030 Lower abdominal pain, unspecified: Secondary | ICD-10-CM | POA: Diagnosis not present

## 2016-06-26 DIAGNOSIS — E860 Dehydration: Secondary | ICD-10-CM | POA: Diagnosis not present

## 2016-06-26 DIAGNOSIS — F329 Major depressive disorder, single episode, unspecified: Secondary | ICD-10-CM | POA: Diagnosis present

## 2016-06-26 DIAGNOSIS — N390 Urinary tract infection, site not specified: Secondary | ICD-10-CM | POA: Diagnosis not present

## 2016-06-26 DIAGNOSIS — R1011 Right upper quadrant pain: Secondary | ICD-10-CM | POA: Diagnosis not present

## 2016-06-26 DIAGNOSIS — E039 Hypothyroidism, unspecified: Secondary | ICD-10-CM | POA: Diagnosis present

## 2016-06-26 DIAGNOSIS — E781 Pure hyperglyceridemia: Secondary | ICD-10-CM | POA: Diagnosis present

## 2016-06-26 DIAGNOSIS — E7800 Pure hypercholesterolemia, unspecified: Secondary | ICD-10-CM | POA: Diagnosis present

## 2016-06-26 DIAGNOSIS — R571 Hypovolemic shock: Secondary | ICD-10-CM | POA: Diagnosis not present

## 2016-06-26 DIAGNOSIS — E785 Hyperlipidemia, unspecified: Secondary | ICD-10-CM | POA: Diagnosis present

## 2016-06-26 DIAGNOSIS — K56601 Complete intestinal obstruction, unspecified as to cause: Secondary | ICD-10-CM | POA: Diagnosis not present

## 2016-06-26 LAB — BASIC METABOLIC PANEL: CREATININE: 0.8 mg/dL (ref 0.5–1.1)

## 2016-06-26 LAB — HEMOGLOBIN A1C: HEMOGLOBIN A1C: 17.3

## 2016-06-26 NOTE — Telephone Encounter (Signed)
Spoke to pt, told her order for referral to Endocrinology was done and someone will be contacting her to schedule an appointment. Pt verbalized understanding and said she has an appointment on Wed to see Dr. Elease Hashimoto but is not sure she can wait till then. Told pt to call back and see if someone can see her sooner. Pt verbalized understanding.

## 2016-06-26 NOTE — Addendum Note (Signed)
Addended by: Marian Sorrow on: 06/26/2016 11:43 AM   Modules accepted: Orders

## 2016-06-27 ENCOUNTER — Other Ambulatory Visit: Payer: Self-pay

## 2016-06-27 ENCOUNTER — Ambulatory Visit: Payer: Medicare Other | Admitting: Family Medicine

## 2016-06-27 DIAGNOSIS — Z0289 Encounter for other administrative examinations: Secondary | ICD-10-CM

## 2016-06-27 LAB — PROTIME-INR

## 2016-06-27 MED ORDER — ESOMEPRAZOLE MAGNESIUM 40 MG PO CPDR: DELAYED_RELEASE_CAPSULE | ORAL | 0 refills | Status: DC

## 2016-06-28 ENCOUNTER — Ambulatory Visit: Payer: Medicare Other | Admitting: Family Medicine

## 2016-06-30 ENCOUNTER — Ambulatory Visit: Payer: Medicare Other | Admitting: Family Medicine

## 2016-07-05 ENCOUNTER — Other Ambulatory Visit: Payer: Self-pay | Admitting: *Deleted

## 2016-07-05 ENCOUNTER — Encounter: Payer: Self-pay | Admitting: Endocrinology

## 2016-07-05 ENCOUNTER — Ambulatory Visit (INDEPENDENT_AMBULATORY_CARE_PROVIDER_SITE_OTHER): Payer: Medicare Other | Admitting: Endocrinology

## 2016-07-05 VITALS — BP 132/84 | HR 102 | Temp 97.8°F | Resp 14 | Ht 63.0 in | Wt 182.6 lb

## 2016-07-05 DIAGNOSIS — Z23 Encounter for immunization: Secondary | ICD-10-CM

## 2016-07-05 DIAGNOSIS — E063 Autoimmune thyroiditis: Secondary | ICD-10-CM

## 2016-07-05 DIAGNOSIS — E1165 Type 2 diabetes mellitus with hyperglycemia: Secondary | ICD-10-CM

## 2016-07-05 DIAGNOSIS — E038 Other specified hypothyroidism: Secondary | ICD-10-CM

## 2016-07-05 LAB — BASIC METABOLIC PANEL
BUN: 15 mg/dL (ref 6–23)
CHLORIDE: 97 meq/L (ref 96–112)
CO2: 26 meq/L (ref 19–32)
CREATININE: 0.96 mg/dL (ref 0.40–1.20)
Calcium: 10 mg/dL (ref 8.4–10.5)
GFR: 62.61 mL/min (ref >60.00)
Glucose, Bld: 174 mg/dL — ABNORMAL HIGH (ref 70–99)
Potassium: 4.3 mEq/L (ref 3.5–5.1)
SODIUM: 135 meq/L (ref 135–145)

## 2016-07-05 LAB — TSH: TSH: 2.9 u[IU]/mL (ref 0.35–4.50)

## 2016-07-05 MED ORDER — INSULIN ASPART 100 UNIT/ML FLEXPEN: PEN_INJECTOR | SUBCUTANEOUS | 2 refills | Status: DC

## 2016-07-05 NOTE — Progress Notes (Signed)
Patient ID: Yesenia Wilson, female   DOB: [birth year], 60 y.o.   MRN: [id number]            Reason for Appointment: Consultation for Type 2 Diabetes  Referring physician: Hospitalist at [facility]   History of Present Illness:          Date of diagnosis of type 2 diabetes mellitus: 2002       Background history:   She had been on metformin for several years with fair control Her blood sugars started increasing in 2016 with A1c over 7% Blood sugars were markedly increased with glucose of 440 in 2/17 and she may not have been regular with her metformin at that time.  She was given Amaryl also but subsequently blood sugars were continuing to be high She was tried on Trulicity in April 0000000 but apparently this did not help her blood sugars  Recent history:   INSULIN regimen is: Lantus 30 units in the a.m., Regular Insulin with sliding scale at supper      Non-insulin hypoglycemic drugs the patient is taking are: Metformin 1 g twice a day  Current management, blood sugar patterns and problems identified:  She was hospitalized last week for various problems including pancreatitis and poor control of diabetes.  A1c was over 17%  She was discharged on Lantus insulin and regular insulin as needed  Currently she is taking regular insulin 30 minutes after eating only when the blood sugar is high at suppertime  She has however drastically changed her diet and is eating carbohydrates only about once a day  Her blood sugars are still high in the morning ranging from 150-185, blood sugars are improved since her discharge on 06/30/16          Side effects from medications have been: None  Compliance with the medical regimen: Improved  Hypoglycemia: Felt shaky once when blood sugar was 74 before lunch    Glucose monitoring:  done 3 times a day         Glucometer: One Touch.      Blood Glucose readings by time of day and averages from meter download:  PREMEAL Breakfast Lunch Dinner Bedtime   Overall   Glucose range: 150-394   137-455  132-199    Median:     199   POST-MEAL PC Breakfast PC Lunch PC Dinner  Glucose range:   218, 164   Median:      Self-care: The diet that the patient has been following is: tries to limit Carbs.     Typical meal intake: Breakfast is Boiled eggs, tomato.  Lunch is usually [place] sandwich with fruit, sometimes salad.  Evening meal is broken leg, cheese, soup               Dietician visit, most recent: None               Exercise: starting walking in the last week  Weight history:  Wt Readings from Last 3 Encounters:  07/05/16 182 lb 9.6 oz (82.8 kg)  03/22/16 192 lb 11.2 oz (87.4 kg)  02/15/16 197 lb (89.4 kg)    Glycemic control:     Lab Results  Component Value Date   HGBA1C 17.3 06/26/2016   HGBA1C 10.5 (H) 02/15/2016   HGBA1C 11.5 11/04/2015   Lab Results  Component Value Date   MICROALBUR 0.4 06/25/2013   LDLCALC 109 (H) 10/07/2014   CREATININE 0.96 07/05/2016   Lab Results  Component Value Date  MICRALBCREAT 0.7 06/25/2013         Medication List       Accurate as of 07/05/16  8:29 PM. Always use your most recent med list.          ARIPiprazole 2 MG tablet Commonly known as:  ABILIFY TAKE 1 TABLET (2 MG TOTAL) BY MOUTH AT BEDTIME.   aspirin 81 MG tablet Take 81 mg by mouth daily.   Dulaglutide 0.75 MG/0.5ML Sopn Commonly known as:  TRULICITY Inject [id number] mg into the skin once a week.   DULoxetine 60 MG capsule Commonly known as:  CYMBALTA TAKE 1 CAPSULE (60 MG TOTAL) BY MOUTH DAILY.   esomeprazole 40 MG capsule Commonly known as:  NEXIUM TAKE ONE CAPSULE BY MOUTH EVERY DAY BEFORE BREAKFAST   estradiol 0.5 MG tablet Commonly known as:  ESTRACE TAKE 1 TABLET (0.5 MG TOTAL) BY MOUTH DAILY.   glimepiride 2 MG tablet Commonly known as:  AMARYL Take 1 tablet (2 mg total) by mouth daily before breakfast.   glucose blood test strip Commonly known as:  ONETOUCH VERIO Use twice a day as  instructed   HUMULIN R IJ Inject as directed. SS   insulin aspart 100 UNIT/ML FlexPen Commonly known as:  NOVOLOG FLEXPEN Inject 6 units three times a day with meals   insulin glargine 100 UNIT/ML injection Commonly known as:  LANTUS Inject 30 Units into the skin at bedtime.   irbesartan-hydrochlorothiazide 300-12.5 MG tablet Commonly known as:  AVALIDE Take 1 tablet by mouth daily.   metFORMIN 1000 MG tablet Commonly known as:  GLUCOPHAGE TAKE 1 TABLET (1,000 MG TOTAL) BY MOUTH 2 (TWO) TIMES DAILY.   metroNIDAZOLE 0.75 % vaginal gel Commonly known as:  METROGEL Apply topically once daily.   ONE TOUCH LANCETS Misc OneTouch lancets, use 2 times daily as directed   rosuvastatin 20 MG tablet Commonly known as:  CRESTOR TAKE 1 TABLET (20 MG TOTAL) BY MOUTH DAILY.   SYNTHROID 125 MCG tablet Generic drug:  levothyroxine Take 1 tablet (125 mcg total) by mouth daily before breakfast.   traMADol 50 MG tablet Commonly known as:  ULTRAM Take one to two tablets every 6 hours prn pain   Vitamin B-12 2500 MCG Subl Place 1 tablet under the tongue daily.   XOPENEX HFA 45 MCG/ACT inhaler Generic drug:  levalbuterol Inhale 1-2 puffs into the lungs 4 (four) times daily as needed for shortness of breath.       Allergies:  Allergies  Allergen Reactions  . Oxycodone-Acetaminophen     REACTION: GI upset  . Propoxyphene N-Acetaminophen     REACTION: nausea, rapid heartrate    Past Medical History:  Diagnosis Date  . Asthma   . Depression   . Diabetes mellitus   . Fibromyalgia   . Hyperlipidemia   . Hypertension   . Lupus   . Sleep apnea   . Thyroid disease     Past Surgical History:  Procedure Laterality Date  . APPENDECTOMY    . bladder tack    . CESAREAN SECTION    . CHOLECYSTECTOMY    . SEPTOPLASTY      Family History  Problem Relation Age of Onset  . Adopted: Yes    Social History:  reports that she has never smoked. She has never used smokeless  tobacco. She reports that she drinks alcohol. She reports that she does not use drugs.   Review of Systems  Constitutional: Positive for weight loss.  HENT: Negative for  headaches.   Eyes: Negative for blurred vision.       Recently has not needed her glasses with change in vision after hospitalization  Respiratory: Positive for shortness of breath.   Cardiovascular: Negative for leg swelling.  Gastrointestinal: Negative for nausea and abdominal pain.  Endocrine: Negative for fatigue and cold intolerance.  Musculoskeletal: Positive for back pain.  Neurological: Positive for numbness and tingling.       At times has tingling, no significant  pains  Psychiatric/Behavioral:       Depression under control.  Has had significant family events causing stress     Lipid history: She has been on Crestor for hyperlipidemia.  He recently and the hospital her triglycerides were 436 before discharge    Lab Results  Component Value Date   CHOL 156 06/30/2015   HDL 43.40 06/30/2015   LDLCALC 109 (H) 10/07/2014   LDLDIRECT 86.0 06/30/2015   TRIG 242.0 (H) 06/30/2015   CHOLHDL 4 06/30/2015           Hypertension:She was on Avalide and metoprolol and these were stopped when she was admitted.  She was told that the HCTZ and metoprolol may cause pancreatitis  Most recent eye exam was 5/17   Most recent foot exam:10/17  HYPOTHYROIDISM: She thinks she has been hypothyroid since age of 70 and at that time she was having fatigue and lethargy. Her dosage was increased in 5/17 but has not had a follow-up for her TSH since then  Lab Results  Component Value Date   TSH 2.90 07/05/2016   TSH 7.42 (H) 02/15/2016   TSH 0.69 01/22/2015   FREET4 1.33 10/01/2009     LABS:  Office Visit on 07/05/2016  Component Date Value Ref Range Status  . TSH 07/05/2016 2.90  0.35 - 4.50 uIU/mL Final  . Sodium 07/05/2016 135  135 - 145 mEq/L Final  . Potassium 07/05/2016 4.3  3.5 - 5.1 mEq/L Final  .  Chloride 07/05/2016 97  96 - 112 mEq/L Final  . CO2 07/05/2016 26  19 - 32 mEq/L Final  . Glucose, Bld 07/05/2016 174* 70 - 99 mg/dL Final  . BUN 07/05/2016 15  6 - 23 mg/dL Final  . Creatinine, Ser 07/05/2016 0.96  0.40 - 1.20 mg/dL Final  . Calcium 07/05/2016 10.0  8.4 - 10.5 mg/dL Final  . GFR 07/05/2016 62.61  >60.00 mL/min Final  . Creatinine 06/26/2016 0.8  0.5 - 1.1 mg/dL Final  . Hemoglobin A1C 06/26/2016 17.3   Final    Physical Examination:  BP 132/84   Pulse (!) 102   Temp 97.8 F (36.6 C)   Resp 14   Ht 5\' 3"  (1.6 m)   Wt 182 lb 9.6 oz (82.8 kg)   SpO2 98%   BMI 32.35 kg/m   GENERAL:         Patient has generalized obesity.   HEENT:         Eye exam shows normal external appearance. Fundus exam shows no retinopathy.  Oral exam shows normal mucosa .  NECK:   There is no lymphadenopathy Thyroid is not enlarged and no nodules felt.  Carotids are normal to palpation and no bruit heard LUNGS:         Chest is symmetrical. Lungs are clear to auscultation.Marland Kitchen   HEART:         Heart sounds:  S1 and S2 are normal. No murmur or click heard., no S3 or S4.   ABDOMEN:  There is no distention present. Liver and spleen are not palpable. No other mass or tenderness present.   NEUROLOGICAL:   Ankle jerks are absent bilaterally.    Diabetic Foot Exam - Simple   Simple Foot Form Diabetic Foot exam was performed with the following findings:  Yes 07/05/2016  3:19 PM  Visual Inspection No deformities, no ulcerations, no other skin breakdown bilaterally:  Yes Sensation Testing Intact to touch and monofilament testing bilaterally:  Yes Pulse Check Posterior Tibialis and Dorsalis pulse intact bilaterally:  Yes Comments            Vibration sense is  Mildly reduced in distal first toes. MUSCULOSKELETAL:  There is no swelling or deformity of the peripheral joints. Spine is normal to inspection.   EXTREMITIES:     There is no edema. No skin lesions present.Marland Kitchen SKIN:       No rash or  lesions of concern.        ASSESSMENT:  Diabetes type 2, uncontrolled  She has had severe hyperglycemia since early part of this year with A1c eventually going up to 17.3In October Currently with taking insulin since her hospitalization and blood sugars are improving but is taking primarily basal insulin Fasting readings are mildly increased and she tends to have readings over 200 after meals containing carbohydrate She has been told to take insulin only with high blood sugars and is doing this only at suppertime but inappropriately about 30 minutes after eating However her diet is significantly better in the last few days and usually lower in carbohydrate and portions She has been better compliant with monitoring her blood sugar recently Also trying to start walking for exercise  Complications of diabetes: Minimal neuropathy, reportedly no retinopathy, needs assessment of nephropathy  HYPERTENSION: Previously on Avalide and metoprolol, currently blood pressure not significant [place] without any medications and will continue to monitor  HYPERLIPIDEMIA: Has a tendency to persistently high triglycerides, will need reevaluation of fasting lipids once diabetes is better controlled.  Reportedly has had pancreatitis last week, etiology unclear, may have been related to hypertriglyceridemia  HYPOTHYROIDISM: This is long-standing, no associated goiter.  Subjectively she is doing well currently and since her dose was increased in 5/17 needs a follow-up TSH  PLAN for diabetes:    Continue Lantus in the morning and increase the dose to 32 units  Start taking the regular insulin before eating rather than after  For simplicity will take the standard dose of 6 units for any meal that has carbohydrate.  Since she prefers an insulin pen showed her how to use a NOVOLOG pen and she can switch to this with the same dosage  Continue metformin   When Lantus as that if she can switch to [facility] same dose  for more consistent 24 control  Check thyroid levels and electrolytes today  Start supplement for magnesium since her level was only 1.4 in the hospital  Check fasting lipids on the next visit  Continue to monitor blood pressure, may need to restart an ARB drug when blood pressure goes up  Follow-up in 4 weeks  Consultation with dietitian   Patient Instructions  Lantus 32 units  Any meal with Carbs take 6 units Reg insulin at least 10-15 min before the meal  Check blood sugars on waking up daily   Also check blood sugars before meals and some about 2 hours after a meal and do this after different meals by rotation  Recommended blood sugar levels on waking  up is 90-130 and about 2 hours after meal is 130-160  Please bring your blood sugar monitor to each visit, thank you     Counseling time on subjects discussed above is over 50% of today's 60 minute visit   Consultation note has been sent to the referring physician  [facility] 07/05/2016, 8:29 PM   Note: This office note was prepared with Dragon voice recognition system technology. Any transcriptional errors that result from this process are unintentional.

## 2016-07-05 NOTE — Patient Instructions (Addendum)
Lantus 32 units  Any meal with Carbs take 6 units Reg insulin at least 10-15 min before the meal  Check blood sugars on waking up daily   Also check blood sugars before meals and some about 2 hours after a meal and do this after different meals by rotation  Recommended blood sugar levels on waking up is 90-130 and about 2 hours after meal is 130-160  Please bring your blood sugar monitor to each visit, thank you

## 2016-07-06 ENCOUNTER — Telehealth: Payer: Self-pay | Admitting: Endocrinology

## 2016-07-06 ENCOUNTER — Other Ambulatory Visit: Payer: Self-pay | Admitting: *Deleted

## 2016-07-06 MED ORDER — INSULIN PEN NEEDLE 32G X 4 MM MISC: 3 refills | Status: DC

## 2016-07-06 NOTE — Telephone Encounter (Signed)
Rx sent 

## 2016-07-06 NOTE — Telephone Encounter (Signed)
Patient has some question about her insulin Novalog. Please advise

## 2016-07-06 NOTE — Telephone Encounter (Signed)
Patient stated that she need the pen needles for her medication Novalog  [id number] - [place], [place] [address] [phone] (Phone) [phone] (Fax)

## 2016-07-07 NOTE — Progress Notes (Signed)
Please let patient know that the thyroid test, kidney function and potassium okay

## 2016-07-21 ENCOUNTER — Ambulatory Visit: Payer: Medicare Other | Admitting: Dietician

## 2016-07-27 ENCOUNTER — Other Ambulatory Visit: Payer: Self-pay | Admitting: Endocrinology

## 2016-07-28 ENCOUNTER — Ambulatory Visit (INDEPENDENT_AMBULATORY_CARE_PROVIDER_SITE_OTHER): Payer: Medicare Other | Admitting: Family Medicine

## 2016-07-28 VITALS — BP 160/88 | HR 111 | Temp 97.9°F | Ht 63.0 in | Wt 185.0 lb

## 2016-07-28 DIAGNOSIS — I10 Essential (primary) hypertension: Secondary | ICD-10-CM | POA: Diagnosis not present

## 2016-07-28 DIAGNOSIS — B373 Candidiasis of vulva and vagina: Secondary | ICD-10-CM

## 2016-07-28 DIAGNOSIS — R350 Frequency of micturition: Secondary | ICD-10-CM

## 2016-07-28 DIAGNOSIS — Z8719 Personal history of other diseases of the digestive system: Secondary | ICD-10-CM

## 2016-07-28 DIAGNOSIS — B3731 Acute candidiasis of vulva and vagina: Secondary | ICD-10-CM

## 2016-07-28 LAB — POCT URINALYSIS DIPSTICK
Leukocytes, UA: NEGATIVE
Nitrite, UA: NEGATIVE
PH UA: 5
RBC UA: NEGATIVE
Urobilinogen, UA: 1

## 2016-07-28 MED ORDER — FLUCONAZOLE 150 MG PO TABS: 150.0000 mg | ORAL_TABLET | Freq: Once | ORAL | 0 refills | Status: AC

## 2016-07-28 NOTE — Patient Instructions (Signed)
Monitor blood pressure and record Let's plan on follow up in two weeks.

## 2016-07-28 NOTE — Progress Notes (Signed)
Subjective:     Patient ID: Yesenia Wilson, female   DOB: 1956-08-23, 60 y.o.   MRN: [id number]  HPI Patient is here for acute issue of dysuria of 4 days' duration along with some whitish pruritic vaginal discharge. She has had yeast vaginitis in the past and similar symptoms. She's not been on any recent antibiotics. She does have type 2 diabetes with recent poor control. She has history of poor compliance. She had been on GLP-1 medication but apparently last summer developed acute pancreatitis. We have no records. No alcohol use. She's had previous cholecystectomy. Was felt that her pancreatitis might have been related to medications and HCTZ and Trulicity were discontinued at that point. She has recently established with endocrinology and is currently on regimen of long-acting insulin once daily along with NovoLog 6 units with meals 3 times daily.  She is currently not taking any blood pressure medications. She states her home blood pressures have consistently been around 120/70, though considerably elevated today. No chest pains. No headaches. No dizziness. No fevers or chills.  Past Medical History:  Diagnosis Date  . Asthma   . Depression   . Diabetes mellitus   . Fibromyalgia   . Hyperlipidemia   . Hypertension   . Lupus   . Sleep apnea   . Thyroid disease    Past Surgical History:  Procedure Laterality Date  . APPENDECTOMY    . bladder tack    . CESAREAN SECTION    . CHOLECYSTECTOMY    . SEPTOPLASTY      reports that she has never smoked. She has never used smokeless tobacco. She reports that she drinks alcohol. She reports that she does not use drugs. family history is not on file. She was adopted. Allergies  Allergen Reactions  . Oxycodone-Acetaminophen     REACTION: GI upset  . Propoxyphene N-Acetaminophen     REACTION: nausea, rapid heartrate     Review of Systems  Constitutional: Negative for fatigue.  Eyes: Negative for visual disturbance.  Respiratory: Negative  for cough, chest tightness, shortness of breath and wheezing.   Cardiovascular: Negative for chest pain, palpitations and leg swelling.  Genitourinary: Positive for dysuria and vaginal discharge. Negative for genital sores and hematuria.  Neurological: Negative for dizziness, seizures, syncope, weakness, light-headedness and headaches.       Objective:   Physical Exam  Constitutional: She appears well-developed and well-nourished.  Neck: Neck supple.  Cardiovascular: Normal rate and regular rhythm.   Pulmonary/Chest: Effort normal and breath sounds normal. No respiratory distress. She has no wheezes. She has no rales.  Musculoskeletal: She exhibits no edema.  Neurological: She is alert.       Assessment:     #1 dysuria. Urine dipstick reveals no leukocytes and no nitrites and no blood  #2 vaginal discharge. Suspect Candida vaginitis  #3 history of recent reported acute pancreatitis. We have no records of hospitalization  #4 elevated blood pressure. She has history of hypertension currently untreated with reported good readings at home    Plan:     -Fluconazole 150 mg 1 dose -No indication for antibiotics at this time -Patient will continue close follow-up with endocrinology. She unfortunately might have had complication of pancreatitis related to trulicity -Monitor blood pressure closely at home and bring in log of home readings at follow-up in 2 weeks. If still up at that point consider medication  Eulas Post MD [facility]

## 2016-07-28 NOTE — Progress Notes (Signed)
Pre visit review using our clinic review tool, if applicable. No additional management support is needed unless otherwise documented below in the visit note. 

## 2016-08-02 ENCOUNTER — Other Ambulatory Visit: Payer: Self-pay

## 2016-08-02 ENCOUNTER — Telehealth: Payer: Self-pay | Admitting: Family Medicine

## 2016-08-02 ENCOUNTER — Ambulatory Visit: Payer: Medicare Other | Admitting: Endocrinology

## 2016-08-02 ENCOUNTER — Telehealth: Payer: Self-pay | Admitting: Endocrinology

## 2016-08-02 MED ORDER — GLUCOSE BLOOD VI STRP: ORAL_STRIP | 5 refills | Status: DC

## 2016-08-02 NOTE — Telephone Encounter (Signed)
Pt would like to go back on the Bp medication today she state it is 191/98 and really would like to be put back on the Bp medication.  Pt state is has been going up higher and higher.

## 2016-08-02 NOTE — Telephone Encounter (Signed)
Pt tests 4 times daily and needs refills on the one touch verio  delica test strips and lancets  [facility]

## 2016-08-02 NOTE — Telephone Encounter (Signed)
Ordered 08/02/16

## 2016-08-03 ENCOUNTER — Encounter: Payer: Self-pay | Admitting: Endocrinology

## 2016-08-03 ENCOUNTER — Ambulatory Visit (INDEPENDENT_AMBULATORY_CARE_PROVIDER_SITE_OTHER): Payer: Medicare Other | Admitting: Endocrinology

## 2016-08-03 VITALS — BP 136/86 | HR 96 | Ht 62.5 in | Wt 187.0 lb

## 2016-08-03 DIAGNOSIS — E1165 Type 2 diabetes mellitus with hyperglycemia: Secondary | ICD-10-CM

## 2016-08-03 DIAGNOSIS — Z794 Long term (current) use of insulin: Secondary | ICD-10-CM

## 2016-08-03 LAB — MICROALBUMIN / CREATININE URINE RATIO
Creatinine,U: 114.7 mg/dL
MICROALB/CREAT RATIO: 2.6 mg/g (ref 0.0–30.0)
Microalb, Ur: 3 mg/dL — ABNORMAL HIGH (ref 0.0–1.9)

## 2016-08-03 LAB — LIPID PANEL
CHOLESTEROL: 266 mg/dL — AB (ref 0–200)
HDL: 48.1 mg/dL (ref >39.00)
NonHDL: 218.21
TRIGLYCERIDES: 221 mg/dL — AB (ref 0.0–149.0)
Total CHOL/HDL Ratio: 6
VLDL: 44.2 mg/dL — ABNORMAL HIGH (ref 0.0–40.0)

## 2016-08-03 LAB — LDL CHOLESTEROL, DIRECT: LDL DIRECT: 208 mg/dL

## 2016-08-03 NOTE — Patient Instructions (Addendum)
Lantus 18 units twice daily, call before running out  Check blood sugars on waking up  4-5x weekly  Also check blood sugars about 2 hours after a meal and do this after different meals by rotation  Recommended blood sugar levels on waking up is 90-130 and about 2 hours after meal is 130-160  Please bring your blood sugar monitor to each visit, thank you

## 2016-08-03 NOTE — Progress Notes (Signed)
Patient ID: Yesenia Wilson, female   DOB: 05-13-1956, 60 y.o.   MRN: [id number]            Reason for Appointment: Follow-up for Type 2 Diabetes  Referring physician: Hospitalist at [facility]   History of Present Illness:          Date of diagnosis of type 2 diabetes mellitus: 2002       Background history:   She had been on metformin for several years with fair control Her blood sugars started increasing in 2016 with A1c over 7% Blood sugars were markedly increased with glucose of 440 in 2/17 and she may not have been regular with her metformin at that time.  She was given Amaryl also but subsequently blood sugars were continuing to be high She was tried on Trulicity in April 0000000 but apparently this did not help her blood sugars  Recent history:   INSULIN regimen is: Lantus 30 units in the a.m., Regular Insulin 6 with meals      Non-insulin hypoglycemic drugs the patient is taking are: Metformin 1 g twice a day  A1c was over 17% during her hospitalization in October  Current management, blood sugar patterns and problems identified:  She was started on a regular regimen of her mealtime insulin on her initial consultation.  Although she was supposed to switch to NovoLog she is still using up her regular insulin.  Lantus was continued unchanged  FASTING blood sugars are appearing to be persistently high now  She does have significant fluctuation of her blood sugars around lunchtime and some before supper time also  She is still trying to eat a low carbohydrate meal usually and has generally his not eating much at suppertime, frequently just soup or salad  Has not done any readings after supper  No hypoglycemia currently  No recent labs available to assess her diabetes control  Side effects from medications have been: None  Compliance with the medical regimen: Improved  Hypoglycemia: None  Glucose monitoring:  done 3 times a day         Glucometer: One Touch.        Blood Glucose readings by time of day and averages from meter download:  Mean values apply above for all meters except median for One Touch  PRE-MEAL Fasting Lunch Dinner Bedtime Overall  Glucose range: 163-298  98-303  130-321     Mean/median: 200  188  196   193   Self-care: The diet that the patient has been following is: tries to limit Carbs.     Typical meal intake: Breakfast is Boiled eggs, tomato.  Lunch is usually [place] sandwich with fruit, sometimes salad.  Evening meal is salad or soup               Dietician visit, most recent: Pending               Exercise:  walking 20 min 3/7 Days a week  Weight history:  Wt Readings from Last 3 Encounters:  08/03/16 187 lb (84.8 kg)  07/28/16 185 lb (83.9 kg)  07/05/16 182 lb 9.6 oz (82.8 kg)    Glycemic control:     Lab Results  Component Value Date   HGBA1C 17.3 06/26/2016   HGBA1C 10.5 (H) 02/15/2016   HGBA1C 11.5 11/04/2015   Lab Results  Component Value Date   MICROALBUR 0.4 06/25/2013   LDLCALC 109 (H) 10/07/2014   CREATININE 0.96 07/05/2016   Lab Results  Component  Value Date   MICRALBCREAT 0.7 06/25/2013    Other active problems: See review of systems     Medication List       Accurate as of 08/03/16  9:46 AM. Always use your most recent med list.          ARIPiprazole 2 MG tablet Commonly known as:  ABILIFY TAKE 1 TABLET (2 MG TOTAL) BY MOUTH AT BEDTIME.   aspirin 81 MG tablet Take 81 mg by mouth daily.   DULoxetine 60 MG capsule Commonly known as:  CYMBALTA TAKE 1 CAPSULE (60 MG TOTAL) BY MOUTH DAILY.   esomeprazole 40 MG capsule Commonly known as:  NEXIUM TAKE ONE CAPSULE BY MOUTH EVERY DAY BEFORE BREAKFAST   estradiol 0.5 MG tablet Commonly known as:  ESTRACE TAKE 1 TABLET (0.5 MG TOTAL) BY MOUTH DAILY.   glucose blood test strip Commonly known as:  ONETOUCH VERIO Use to test blood sugar 4 times a day   insulin aspart 100 UNIT/ML FlexPen Commonly known as:  NOVOLOG  FLEXPEN Inject 6 units three times a day with meals   Insulin Pen Needle 32G X 4 MM Misc Commonly known as:  BD PEN NEEDLE NANO U/F Use 3 per to day to inject novolog   LANTUS 100 UNIT/ML injection Generic drug:  insulin glargine USE 30 UNITS DAILY   metFORMIN 1000 MG tablet Commonly known as:  GLUCOPHAGE TAKE 1 TABLET (1,000 MG TOTAL) BY MOUTH 2 (TWO) TIMES DAILY.   metroNIDAZOLE 0.75 % vaginal gel Commonly known as:  METROGEL Apply topically once daily.   ONE TOUCH LANCETS Misc OneTouch lancets, use 2 times daily as directed   rosuvastatin 20 MG tablet Commonly known as:  CRESTOR TAKE 1 TABLET (20 MG TOTAL) BY MOUTH DAILY.   SYNTHROID 125 MCG tablet Generic drug:  levothyroxine Take 1 tablet (125 mcg total) by mouth daily before breakfast.   traMADol 50 MG tablet Commonly known as:  ULTRAM Take one to two tablets every 6 hours prn pain   Vitamin B-12 2500 MCG Subl Place 1 tablet under the tongue daily.   XOPENEX HFA 45 MCG/ACT inhaler Generic drug:  levalbuterol Inhale 1-2 puffs into the lungs 4 (four) times daily as needed for shortness of breath.       Allergies:  Allergies  Allergen Reactions  . Oxycodone-Acetaminophen     REACTION: GI upset  . Propoxyphene N-Acetaminophen     REACTION: nausea, rapid heartrate    Past Medical History:  Diagnosis Date  . Asthma   . Depression   . Diabetes mellitus   . Fibromyalgia   . Hyperlipidemia   . Hypertension   . Lupus   . Sleep apnea   . Thyroid disease     Past Surgical History:  Procedure Laterality Date  . APPENDECTOMY    . bladder tack    . CESAREAN SECTION    . CHOLECYSTECTOMY    . SEPTOPLASTY      Family History  Problem Relation Age of Onset  . Adopted: Yes    Social History:  reports that she has never smoked. She has never used smokeless tobacco. She reports that she drinks alcohol. She reports that she does not use drugs.   Review of Systems   Lipid history: She has been on  Crestor for hyperlipidemia.  Before discharge from the hospital her triglycerides were 436    Lab Results  Component Value Date   CHOL 156 06/30/2015   HDL 43.40 06/30/2015   [place]  109 (H) 10/07/2014   LDLDIRECT 86.0 06/30/2015   TRIG 242.0 (H) 06/30/2015   CHOLHDL 4 06/30/2015           Hypertension:She was on Avalide and metoprolol and these were stopped when she was admitted.   She thinks her blood pressure has been as high as 198 home and is waiting for her PCP to call her back on this  Most recent eye exam was 5/17   Most recent foot exam:10/17  HYPOTHYROIDISM: She thinks she has been hypothyroid since age of 55 and at that time she was having fatigue and lethargy. Her dosage was increased in 5/17  TSH is back to normal recently   Lab Results  Component Value Date   TSH 2.90 07/05/2016   TSH 7.42 (H) 02/15/2016   TSH 0.69 01/22/2015   FREET4 1.33 10/01/2009     LABS:  Office Visit on 07/28/2016  Component Date Value Ref Range Status  . Color, UA 07/28/2016 yellow   Final  . Clarity, UA 07/28/2016 clear   Final  . Glucose, UA 07/28/2016 trace   Final  . Bilirubin, UA 07/28/2016 1+   Final  . Ketones, UA 07/28/2016 trace   Final  . Spec Grav, UA 07/28/2016 >=1.030   Final  . Blood, UA 07/28/2016 negative   Final  . pH, UA 07/28/2016 5.0   Final  . Protein, UA 07/28/2016 1+   Final  . Urobilinogen, UA 07/28/2016 1.0   Final  . Nitrite, UA 07/28/2016 negative   Final  . Leukocytes, UA 07/28/2016 Negative  Negative Final    Physical Examination:  BP 136/86   Pulse 96   Ht 5' 2.5" (1.588 m)   Wt 187 lb (84.8 kg)   SpO2 98%   BMI 33.66 kg/m    ASSESSMENT:  Diabetes type 2, uncontrolled   See history of present illness for detailed discussion of current diabetes management, blood sugar patterns and problems identified  She appears to be requiring significant amount of insulin especially basal with continued hyperglycemia Likely that her Lantus is  lasting 24 hours since fasting readings are about the highest of the day and more consistently high than readings at lunch and supper She is still taking regular insulin instead of Novolog She is starting to do some exercise but does not appear to be doing enough and has not had any weight loss recently She may not be getting balanced meals and is trying to severely restrict carbohydrate currently Also has not checked readings after evening meal as directed   HYPERTENSION: Previously on Avalide and metoprolol, currently blood pressure mildly increased along with increased pulse rate   HYPERLIPIDEMIA: Has a tendency to persistently high triglycerides, will need reevaluation of  lipids today   PLAN for diabetes:    Start taking Lantus twice a day and increase the total dose by 6 units  No change and Regular Insulin and she can switch to the Novolog when Regular Insulin runs out  Discussed timing of glucose monitoring and blood sugar targets at various times  She can have a small amount of carbohydrate at meals since she is taking mealtime coverage  Increase frequency of exercising  She will call before she runs out of Lantus and will consider switching her to [place], discussed need for a steady basal insulin  Also may be a candidate for V-go pump   Follow-up in 4 weeks  Consultation with dietitian as scheduled   Patient Instructions  Lantus 18 units  twice daily   Check blood sugars on waking up  4-5x weekly  Also check blood sugars about 2 hours after a meal and do this after different meals by rotation  Recommended blood sugar levels on waking up is 90-130 and about 2 hours after meal is 130-160  Please bring your blood sugar monitor to each visit, thank you     Counseling time on subjects discussed above is over 50% of today's 25 minute visit    Justine Cossin 08/03/2016, 9:46 AM   Note: This office note was prepared with Estate agent.  Any transcriptional errors that result from this process are unintentional.

## 2016-08-03 NOTE — Telephone Encounter (Signed)
Pt has a BP follow up 11/27. Want to go ahead and start a BP medication or have her come back sooner to discuss further?

## 2016-08-04 ENCOUNTER — Other Ambulatory Visit: Payer: Self-pay

## 2016-08-04 LAB — FRUCTOSAMINE: FRUCTOSAMINE: 279 umol/L (ref 0–285)

## 2016-08-04 MED ORDER — TELMISARTAN 40 MG PO TABS: 40.0000 mg | ORAL_TABLET | Freq: Every day | ORAL | 1 refills | Status: DC

## 2016-08-04 MED ORDER — LOSARTAN POTASSIUM 50 MG PO TABS: 50.0000 mg | ORAL_TABLET | Freq: Every day | ORAL | 3 refills | Status: DC

## 2016-08-04 MED ORDER — ONETOUCH LANCETS MISC: 3 refills | Status: DC

## 2016-08-04 NOTE — Telephone Encounter (Signed)
Pharmacy states the telmisartan (MICARDIS) 40 MG tablet  Is not on her formulary. insurance advise to switch to losartan or valsartan. [facility] would like a call [facility] with one of these alternatives.

## 2016-08-04 NOTE — Telephone Encounter (Signed)
Patient stated pharmacy did not received   ONE TOUCH LANCETS MISC   Please resend  [id number] - [place], [place] [phone] (Phone) [phone] (Fax)   She don't have enough to last her through the weekend

## 2016-08-04 NOTE — Telephone Encounter (Signed)
Refill submitted per patient's request.  

## 2016-08-04 NOTE — Telephone Encounter (Signed)
New medication sent in for patient.

## 2016-08-04 NOTE — Telephone Encounter (Signed)
Start Micardis 40 mg po once daily and office follow up in 2 weeks to reassess.

## 2016-08-04 NOTE — Telephone Encounter (Signed)
Pt is aware that follow up is scheduled already and medication has been sent to the pharmacy.

## 2016-08-04 NOTE — Telephone Encounter (Signed)
Losartan 50 mg once daily

## 2016-08-09 ENCOUNTER — Telehealth: Payer: Self-pay | Admitting: Endocrinology

## 2016-08-09 NOTE — Telephone Encounter (Signed)
Pt also needs her [facility] [place].

## 2016-08-09 NOTE — Telephone Encounter (Signed)
The pharmacy called and said that the prescription that was sent in for the test strips needs to include the ICD10 code and be refaxed.

## 2016-08-10 NOTE — Progress Notes (Signed)
Please let patient know that the cholesterol is very high, looks like she is not on Crestor, please find out

## 2016-08-14 ENCOUNTER — Ambulatory Visit: Payer: Medicare Other | Admitting: Family Medicine

## 2016-08-14 ENCOUNTER — Other Ambulatory Visit: Payer: Self-pay

## 2016-08-14 MED ORDER — ROSUVASTATIN CALCIUM 20 MG PO TABS: ORAL_TABLET | ORAL | 1 refills | Status: DC

## 2016-08-14 MED ORDER — ONETOUCH LANCETS MISC: 1.0000 | Freq: Four times a day (QID) | 2 refills | Status: DC

## 2016-08-15 MED ORDER — ONETOUCH DELICA LANCETS FINE MISC: 2 refills | Status: DC

## 2016-08-15 NOTE — Telephone Encounter (Signed)
Refill for the Lancets submitted with the DX code.

## 2016-08-15 NOTE — Telephone Encounter (Signed)
[facility] called stated they need ICD 9 Code for patient Lancets, please refax

## 2016-08-17 ENCOUNTER — Ambulatory Visit: Payer: Medicare Other | Admitting: Dietician

## 2016-08-19 DIAGNOSIS — R1084 Generalized abdominal pain: Secondary | ICD-10-CM | POA: Diagnosis not present

## 2016-08-19 DIAGNOSIS — Z794 Long term (current) use of insulin: Secondary | ICD-10-CM | POA: Diagnosis not present

## 2016-08-19 DIAGNOSIS — Z9049 Acquired absence of other specified parts of digestive tract: Secondary | ICD-10-CM | POA: Diagnosis not present

## 2016-08-19 DIAGNOSIS — E7800 Pure hypercholesterolemia, unspecified: Secondary | ICD-10-CM | POA: Diagnosis not present

## 2016-08-19 DIAGNOSIS — G4730 Sleep apnea, unspecified: Secondary | ICD-10-CM | POA: Diagnosis not present

## 2016-08-19 DIAGNOSIS — M79602 Pain in left arm: Secondary | ICD-10-CM | POA: Diagnosis not present

## 2016-08-19 DIAGNOSIS — R0789 Other chest pain: Secondary | ICD-10-CM | POA: Diagnosis not present

## 2016-08-19 DIAGNOSIS — E039 Hypothyroidism, unspecified: Secondary | ICD-10-CM | POA: Diagnosis not present

## 2016-08-19 DIAGNOSIS — R109 Unspecified abdominal pain: Secondary | ICD-10-CM | POA: Diagnosis not present

## 2016-08-19 DIAGNOSIS — R061 Stridor: Secondary | ICD-10-CM | POA: Diagnosis not present

## 2016-08-19 DIAGNOSIS — I482 Chronic atrial fibrillation: Secondary | ICD-10-CM | POA: Diagnosis not present

## 2016-08-19 DIAGNOSIS — Z79899 Other long term (current) drug therapy: Secondary | ICD-10-CM | POA: Diagnosis not present

## 2016-08-19 DIAGNOSIS — R1013 Epigastric pain: Secondary | ICD-10-CM | POA: Diagnosis not present

## 2016-08-19 DIAGNOSIS — R51 Headache: Secondary | ICD-10-CM | POA: Diagnosis not present

## 2016-08-19 DIAGNOSIS — J449 Chronic obstructive pulmonary disease, unspecified: Secondary | ICD-10-CM | POA: Diagnosis not present

## 2016-08-19 DIAGNOSIS — E118 Type 2 diabetes mellitus with unspecified complications: Secondary | ICD-10-CM | POA: Diagnosis not present

## 2016-08-19 DIAGNOSIS — K219 Gastro-esophageal reflux disease without esophagitis: Secondary | ICD-10-CM | POA: Diagnosis not present

## 2016-09-06 ENCOUNTER — Other Ambulatory Visit: Payer: Self-pay

## 2016-09-06 MED ORDER — INSULIN GLARGINE 100 UNIT/ML ~~LOC~~ SOLN
SUBCUTANEOUS | 2 refills | Status: DC
Start: 2016-09-06 — End: 2016-11-22

## 2016-09-22 ENCOUNTER — Other Ambulatory Visit: Payer: Self-pay | Admitting: Family Medicine

## 2016-09-27 ENCOUNTER — Ambulatory Visit: Payer: Medicare Other | Admitting: Endocrinology

## 2016-10-17 ENCOUNTER — Ambulatory Visit (INDEPENDENT_AMBULATORY_CARE_PROVIDER_SITE_OTHER): Payer: Medicare Other | Admitting: Family Medicine

## 2016-10-17 VITALS — BP 146/82 | HR 131 | Temp 98.0°F | Ht 62.5 in | Wt 190.1 lb

## 2016-10-17 DIAGNOSIS — J209 Acute bronchitis, unspecified: Secondary | ICD-10-CM

## 2016-10-17 DIAGNOSIS — B373 Candidiasis of vulva and vagina: Secondary | ICD-10-CM

## 2016-10-17 DIAGNOSIS — B3731 Acute candidiasis of vulva and vagina: Secondary | ICD-10-CM

## 2016-10-17 MED ORDER — FLUCONAZOLE 150 MG PO TABS: 150.0000 mg | ORAL_TABLET | Freq: Once | ORAL | 0 refills | Status: AC

## 2016-10-17 MED ORDER — HYDROCODONE-HOMATROPINE 5-1.5 MG/5ML PO SYRP: 5.0000 mL | ORAL_SOLUTION | Freq: Four times a day (QID) | ORAL | 0 refills | Status: AC | PRN

## 2016-10-17 NOTE — Progress Notes (Signed)
Subjective:     Patient ID: Yesenia Wilson, female   DOB: 25-Dec-1955, 61 y.o.   MRN: [id number]  HPI Patient's seen with three-day history of upper respiratory symptoms including sore throat, dry cough, nasal congestion. She's had some sweats but has not had any documented fever. Mild fatigue. She also relates some thick whitish vaginal discharge and itching consistent with prior yeast vaginitis. Requesting fluconazole. She has type 2 diabetes and history of poor compliance. She is followed by endocrinology for that.  No dyspnea. No chest pain. No nausea or vomiting. Cough has been fairly severe at night. Not relieved with over-the-counter medications  Past Medical History:  Diagnosis Date  . Asthma   . Depression   . Diabetes mellitus   . Fibromyalgia   . Hyperlipidemia   . Hypertension   . Lupus   . Sleep apnea   . Thyroid disease    Past Surgical History:  Procedure Laterality Date  . APPENDECTOMY    . bladder tack    . CESAREAN SECTION    . CHOLECYSTECTOMY    . SEPTOPLASTY      reports that she has never smoked. She has never used smokeless tobacco. She reports that she drinks alcohol. She reports that she does not use drugs. family history is not on file. She was adopted. Allergies  Allergen Reactions  . Oxycodone-Acetaminophen     REACTION: GI upset  . Propoxyphene N-Acetaminophen     REACTION: nausea, rapid heartrate     Review of Systems  Constitutional: Positive for chills and fatigue.  HENT: Positive for congestion and sore throat.   Respiratory: Positive for cough.   Cardiovascular: Negative for chest pain.  Genitourinary: Positive for vaginal discharge. Negative for vaginal pain.       Objective:   Physical Exam  Constitutional: She appears well-developed and well-nourished.  HENT:  Right Ear: External ear normal.  Left Ear: External ear normal.  Mouth/Throat: Oropharynx is clear and moist.  Previous uvula excision. No exudate  Neck: Neck supple.   Cardiovascular: Normal rate and regular rhythm.   Pulmonary/Chest: Effort normal and breath sounds normal. No respiratory distress. She has no wheezes. She has no rales.  Lymphadenopathy:    She has no cervical adenopathy.       Assessment:     #1 probable viral URI with cough. Nonfocal exam  #2 probable yeast vaginitis    Plan:     -Fluconazole 150 mg 1 dose -No indication for antibiotics at this time -Hycodan cough syrup 1 teaspoon daily at bedtime for severe cough -Follow-up for any fever or worsening symptoms.  Eulas Post MD  Primary Care at [facility]

## 2016-10-17 NOTE — Progress Notes (Signed)
Pre visit review using our clinic review tool, if applicable. No additional management support is needed unless otherwise documented below in the visit note. 

## 2016-10-17 NOTE — Patient Instructions (Signed)
Viral Illness, Adult Viruses are tiny germs that can get into a person's body and cause illness. There are many different types of viruses, and they cause many types of illness. Viral illnesses can range from mild to severe. They can affect various parts of the body. Common illnesses that are caused by a virus include colds and the flu. Viral illnesses also include serious conditions such as HIV/AIDS (human immunodeficiency virus/acquired immunodeficiency syndrome). A few viruses have been linked to certain cancers. What are the causes? Many types of viruses can cause illness. Viruses invade cells in your body, multiply, and cause the infected cells to malfunction or die. When the cell dies, it releases more of the virus. When this happens, you develop symptoms of the illness, and the virus continues to spread to other cells. If the virus takes over the function of the cell, it can cause the cell to divide and grow out of control, as is the case when a virus causes cancer. Different viruses get into the body in different ways. You can get a virus by:  Swallowing food or water that is contaminated with the virus.  Breathing in droplets that have been coughed or sneezed into the air by an infected person.  Touching a surface that has been contaminated with the virus and then touching your eyes, nose, or mouth.  Being bitten by an insect or animal that carries the virus.  Having sexual contact with a person who is infected with the virus.  Being exposed to blood or fluids that contain the virus, either through an open cut or during a transfusion. If a virus enters your body, your body's defense system (immune system) will try to fight the virus. You may be at higher risk for a viral illness if your immune system is weak. What are the signs or symptoms? Symptoms vary depending on the type of virus and the location of the cells that it invades. Common symptoms of the main types of viral illnesses  include: Cold and flu viruses   Fever.  Headache.  Sore throat.  Muscle aches.  Nasal congestion.  Cough. Digestive system (gastrointestinal) viruses   Fever.  Abdominal pain.  Nausea.  Diarrhea. Liver viruses (hepatitis)   Loss of appetite.  Tiredness.  Yellowing of the skin (jaundice). Brain and spinal cord viruses   Fever.  Headache.  Stiff neck.  Nausea and vomiting.  Confusion or sleepiness. Skin viruses   Warts.  Itching.  Rash. Sexually transmitted viruses   Discharge.  Swelling.  Redness.  Rash. How is this treated? Viruses can be difficult to treat because they live within cells. Antibiotic medicines do not treat viruses because these drugs do not get inside cells. Treatment for a viral illness may include:  Resting and drinking plenty of fluids.  Medicines to relieve symptoms. These can include over-the-counter medicine for pain and fever, medicines for cough or congestion, and medicines to relieve diarrhea.  Antiviral medicines. These drugs are available only for certain types of viruses. They may help reduce flu symptoms if taken early. There are also many antiviral medicines for hepatitis and HIV/AIDS. Some viral illnesses can be prevented with vaccinations. A common example is the flu shot. Follow these instructions at home: Medicines    Take over-the-counter and prescription medicines only as told by your health care provider.  If you were prescribed an antiviral medicine, take it as told by your health care provider. Do not stop taking the medicine even if you start to   feel better.  Be aware of when antibiotics are needed and when they are not needed. Antibiotics do not treat viruses. If your health care provider thinks that you may have a bacterial infection as well as a viral infection, you may get an antibiotic.  Do not ask for an antibiotic prescription if you have been diagnosed with a viral illness. That will not make  your illness go away faster.  Frequently taking antibiotics when they are not needed can lead to antibiotic resistance. When this develops, the medicine no longer works against the bacteria that it normally fights. General instructions   Drink enough fluids to keep your urine clear or pale yellow.  Rest as much as possible.  Return to your normal activities as told by your health care provider. Ask your health care provider what activities are safe for you.  Keep all follow-up visits as told by your health care provider. This is important. How is this prevented? Take these actions to reduce your risk of viral infection:  Eat a healthy diet and get enough rest.  Wash your hands often with soap and water. This is especially important when you are in public places. If soap and water are not available, use hand sanitizer.  Avoid close contact with friends and family who have a viral illness.  If you travel to areas where viral gastrointestinal infection is common, avoid drinking water or eating raw food.  Keep your immunizations up to date. Get a flu shot every year as told by your health care provider.  Do not share toothbrushes, nail clippers, razors, or needles with other people.  Always practice safe sex. Contact a health care provider if:  You have symptoms of a viral illness that do not go away.  Your symptoms come back after going away.  Your symptoms get worse. Get help right away if:  You have trouble breathing.  You have a severe headache or a stiff neck.  You have severe vomiting or abdominal pain. This information is not intended to replace advice given to you by your health care provider. Make sure you discuss any questions you have with your health care provider. Document Released: 01/14/2016 Document Revised: 02/16/2016 Document Reviewed: 01/14/2016 Elsevier Interactive Patient Education  2017 [employer]  

## 2016-10-23 ENCOUNTER — Ambulatory Visit: Payer: Medicare Other | Admitting: Endocrinology

## 2016-11-18 ENCOUNTER — Other Ambulatory Visit: Payer: Self-pay | Admitting: Family Medicine

## 2016-11-20 ENCOUNTER — Emergency Department (HOSPITAL_COMMUNITY): Payer: Medicare Other

## 2016-11-20 ENCOUNTER — Inpatient Hospital Stay (HOSPITAL_COMMUNITY)
Admission: EM | Admit: 2016-11-20 | Discharge: 2016-11-22 | DRG: 287 | Disposition: A | Discharge status: Home / Self Care | Payer: Medicare Other | Attending: Internal Medicine | Admitting: Internal Medicine

## 2016-11-20 ENCOUNTER — Encounter (HOSPITAL_COMMUNITY): Payer: Self-pay | Admitting: Nurse Practitioner

## 2016-11-20 DIAGNOSIS — Z9049 Acquired absence of other specified parts of digestive tract: Secondary | ICD-10-CM

## 2016-11-20 DIAGNOSIS — E669 Obesity, unspecified: Secondary | ICD-10-CM | POA: Diagnosis present

## 2016-11-20 DIAGNOSIS — Z820 Family history of epilepsy and other diseases of the nervous system: Secondary | ICD-10-CM

## 2016-11-20 DIAGNOSIS — R072 Precordial pain: Secondary | ICD-10-CM | POA: Diagnosis present

## 2016-11-20 DIAGNOSIS — I200 Unstable angina: Secondary | ICD-10-CM

## 2016-11-20 DIAGNOSIS — B3731 Acute candidiasis of vulva and vagina: Secondary | ICD-10-CM

## 2016-11-20 DIAGNOSIS — Z636 Dependent relative needing care at home: Secondary | ICD-10-CM

## 2016-11-20 DIAGNOSIS — J45909 Unspecified asthma, uncomplicated: Secondary | ICD-10-CM | POA: Diagnosis present

## 2016-11-20 DIAGNOSIS — Z8379 Family history of other diseases of the digestive system: Secondary | ICD-10-CM

## 2016-11-20 DIAGNOSIS — E872 Acidosis, unspecified: Secondary | ICD-10-CM

## 2016-11-20 DIAGNOSIS — Z6838 Body mass index (BMI) 38.0-38.9, adult: Secondary | ICD-10-CM

## 2016-11-20 DIAGNOSIS — B373 Candidiasis of vulva and vagina: Secondary | ICD-10-CM | POA: Diagnosis not present

## 2016-11-20 DIAGNOSIS — F339 Major depressive disorder, recurrent, unspecified: Secondary | ICD-10-CM | POA: Diagnosis not present

## 2016-11-20 DIAGNOSIS — R740 Nonspecific elevation of levels of transaminase and lactic acid dehydrogenase [LDH]: Secondary | ICD-10-CM | POA: Diagnosis present

## 2016-11-20 DIAGNOSIS — E7800 Pure hypercholesterolemia, unspecified: Secondary | ICD-10-CM | POA: Diagnosis present

## 2016-11-20 DIAGNOSIS — R079 Chest pain, unspecified: Secondary | ICD-10-CM | POA: Diagnosis present

## 2016-11-20 DIAGNOSIS — I452 Bifascicular block: Secondary | ICD-10-CM | POA: Diagnosis present

## 2016-11-20 DIAGNOSIS — Z8241 Family history of sudden cardiac death: Secondary | ICD-10-CM

## 2016-11-20 DIAGNOSIS — M797 Fibromyalgia: Secondary | ICD-10-CM | POA: Diagnosis present

## 2016-11-20 DIAGNOSIS — K219 Gastro-esophageal reflux disease without esophagitis: Secondary | ICD-10-CM | POA: Diagnosis present

## 2016-11-20 DIAGNOSIS — G4730 Sleep apnea, unspecified: Secondary | ICD-10-CM | POA: Diagnosis present

## 2016-11-20 DIAGNOSIS — E1165 Type 2 diabetes mellitus with hyperglycemia: Secondary | ICD-10-CM | POA: Diagnosis not present

## 2016-11-20 DIAGNOSIS — E039 Hypothyroidism, unspecified: Secondary | ICD-10-CM | POA: Diagnosis present

## 2016-11-20 DIAGNOSIS — Z79890 Hormone replacement therapy (postmenopausal): Secondary | ICD-10-CM

## 2016-11-20 DIAGNOSIS — R0602 Shortness of breath: Secondary | ICD-10-CM | POA: Diagnosis not present

## 2016-11-20 DIAGNOSIS — Z79899 Other long term (current) drug therapy: Secondary | ICD-10-CM

## 2016-11-20 DIAGNOSIS — Z833 Family history of diabetes mellitus: Secondary | ICD-10-CM

## 2016-11-20 DIAGNOSIS — Z7901 Long term (current) use of anticoagulants: Secondary | ICD-10-CM

## 2016-11-20 DIAGNOSIS — Z86718 Personal history of other venous thrombosis and embolism: Secondary | ICD-10-CM

## 2016-11-20 DIAGNOSIS — E785 Hyperlipidemia, unspecified: Secondary | ICD-10-CM | POA: Diagnosis present

## 2016-11-20 DIAGNOSIS — I25110 Atherosclerotic heart disease of native coronary artery with unstable angina pectoris: Principal | ICD-10-CM | POA: Diagnosis present

## 2016-11-20 DIAGNOSIS — Z794 Long term (current) use of insulin: Secondary | ICD-10-CM

## 2016-11-20 DIAGNOSIS — R000 Tachycardia, unspecified: Secondary | ICD-10-CM | POA: Diagnosis present

## 2016-11-20 DIAGNOSIS — Z7982 Long term (current) use of aspirin: Secondary | ICD-10-CM

## 2016-11-20 DIAGNOSIS — I10 Essential (primary) hypertension: Secondary | ICD-10-CM | POA: Diagnosis present

## 2016-11-20 DIAGNOSIS — Z8249 Family history of ischemic heart disease and other diseases of the circulatory system: Secondary | ICD-10-CM

## 2016-11-20 DIAGNOSIS — Z79891 Long term (current) use of opiate analgesic: Secondary | ICD-10-CM

## 2016-11-20 DIAGNOSIS — Z885 Allergy status to narcotic agent status: Secondary | ICD-10-CM

## 2016-11-20 HISTORY — DX: Acute pancreatitis without necrosis or infection, unspecified: K85.90

## 2016-11-20 LAB — BASIC METABOLIC PANEL
ANION GAP: 14 (ref 5–15)
BUN: 17 mg/dL (ref 6–20)
CALCIUM: 9.8 mg/dL (ref 8.9–10.3)
CHLORIDE: 95 mmol/L — AB (ref 101–111)
CO2: 24 mmol/L (ref 22–32)
Creatinine, Ser: 1.03 mg/dL — ABNORMAL HIGH (ref 0.44–1.00)
GFR calc Af Amer: >60 mL/min (ref >60)
GFR calc non Af Amer: 57 mL/min — ABNORMAL LOW (ref >60)
Glucose, Bld: 378 mg/dL — ABNORMAL HIGH (ref 65–99)
POTASSIUM: 4 mmol/L (ref 3.5–5.1)
Sodium: 133 mmol/L — ABNORMAL LOW (ref 135–145)

## 2016-11-20 LAB — CBC
HEMATOCRIT: 42.8 % (ref 36.0–46.0)
HEMOGLOBIN: 14 g/dL (ref 12.0–15.0)
MCH: 28.2 pg (ref 26.0–34.0)
MCHC: 32.7 g/dL (ref 30.0–36.0)
MCV: 86.3 fL (ref 78.0–100.0)
Platelets: 340 10*3/uL (ref 150–400)
RBC: 4.96 MIL/uL (ref 3.87–5.11)
RDW: 14 % (ref 11.5–15.5)
WBC: 14.7 10*3/uL — ABNORMAL HIGH (ref 4.0–10.5)

## 2016-11-20 LAB — URINALYSIS, ROUTINE W REFLEX MICROSCOPIC
BILIRUBIN URINE: NEGATIVE
Glucose, UA: >=500 mg/dL — AB
HGB URINE DIPSTICK: NEGATIVE
KETONES UR: NEGATIVE mg/dL
LEUKOCYTES UA: NEGATIVE
NITRITE: NEGATIVE
Protein, ur: NEGATIVE mg/dL
Specific Gravity, Urine: 1.03 (ref 1.005–1.030)
pH: 5 (ref 5.0–8.0)

## 2016-11-20 LAB — LIPASE, BLOOD: Lipase: 23 U/L (ref 11–51)

## 2016-11-20 LAB — I-STAT TROPONIN, ED: TROPONIN I, POC: 0 ng/mL (ref 0.00–0.08)

## 2016-11-20 LAB — GLUCOSE, CAPILLARY: Glucose-Capillary: 248 mg/dL — ABNORMAL HIGH (ref 65–99)

## 2016-11-20 LAB — D-DIMER, QUANTITATIVE: D-Dimer, Quant: 0.29 ug/mL-FEU (ref 0.00–0.50)

## 2016-11-20 LAB — I-STAT CG4 LACTIC ACID, ED: LACTIC ACID, VENOUS: 2.41 mmol/L — AB (ref 0.5–1.9)

## 2016-11-20 LAB — CBG MONITORING, ED: GLUCOSE-CAPILLARY: 365 mg/dL — AB (ref 65–99)

## 2016-11-20 MED ORDER — NITROGLYCERIN 0.4 MG SL SUBL: 0.4000 mg | SUBLINGUAL_TABLET | SUBLINGUAL | Status: DC | PRN

## 2016-11-20 MED ORDER — SODIUM CHLORIDE 0.9 % IV BOLUS (SEPSIS)
1000.0000 mL | Freq: Once | INTRAVENOUS | Status: AC
  Administered 2016-11-20: 1000 mL via INTRAVENOUS

## 2016-11-20 MED ORDER — FLUCONAZOLE 100 MG PO TABS
150.0000 mg | ORAL_TABLET | Freq: Once | ORAL | Status: AC
  Administered 2016-11-20: 150 mg via ORAL
  Filled 2016-11-20: qty 2

## 2016-11-20 MED ORDER — ASPIRIN 81 MG PO CHEW
324.0000 mg | CHEWABLE_TABLET | Freq: Once | ORAL | Status: AC
  Administered 2016-11-20: 324 mg via ORAL
  Filled 2016-11-20: qty 4

## 2016-11-20 NOTE — ED Triage Notes (Addendum)
Pt presents with c/o chest pain. The pain began this afternoon after her aunts funeral. The pain is a sharp midsternal pain that comes and goes since onset. She reports lightheadedness, SOB, cough. she denies fevers. She felt shaky so she checked her blood sugar and it was elevated in the 400s after she ate lunch  She has been hospitalized for pancreatitis in the past and feels like this pain is somewhat similar.

## 2016-11-20 NOTE — ED Provider Notes (Signed)
[facility] DEPT Provider Note   CSN: [id number] Arrival date & time: 11/20/16  [place]     History   Chief Complaint Chief Complaint  Patient presents with  . Chest Pain    HPI Yesenia Wilson is a 61 y.o. female.  Pt presents to the ED today with CP.  The pt said she developed midsternal CP after her aunt's funeral when she was walking to the car.  She said she felt nauseous, shaky, and sob.  The pt's bs has been running high, and she checked it and it was in the 400s.  The pt said she's been very tired.  She also thinks she has a vaginal yeast infection.  Pt has a strong family hx of cad.  Last stress test around 10 years ago.      Past Medical History:  Diagnosis Date  . Asthma   . Depression   . Diabetes mellitus   . Fibromyalgia   . Hyperlipidemia   . Hypertension   . Lupus   . Pancreatitis   . Sleep apnea   . Thyroid disease     Patient Active Problem List   Diagnosis Date Noted  . Chest pain 11/20/2016  . Urinary urgency 03/22/2016  . Rosacea 01/31/2016  . GERD (gastroesophageal reflux disease) 12/13/2015  . Obesity (BMI 30-39.9) 06/25/2013  . Recurrent depression ([place]) 03/02/2012  . EDEMA 05/24/2010  . BACK PAIN, LUMBAR 03/24/2010  . DEEP VENOUS THROMBOPHLEBITIS, LEG 01/03/2010  . ALOPECIA 08/03/2009  . Diabetes type 2, uncontrolled ([place]) 03/03/2009  . SLEEP APNEA 11/14/2008  . FIBROMYALGIA 08/25/2008  . DYSPNEA 07/21/2008  . Hypothyroidism 07/18/2008  . Hyperlipidemia 07/18/2008  . Essential hypertension 07/18/2008  . ASTHMA 07/18/2008    Past Surgical History:  Procedure Laterality Date  . APPENDECTOMY    . bladder tack    . CESAREAN SECTION    . CHOLECYSTECTOMY    . SEPTOPLASTY      OB History    No data available       Home Medications    Prior to Admission medications   Medication Sig Start Date End Date Taking? Authorizing Provider  ARIPiprazole (ABILIFY) 2 MG tablet TAKE 1 TABLET (2 MG TOTAL) BY MOUTH AT BEDTIME. 11/17/15  Yes  Eulas Post, MD  aspirin 81 MG tablet Take 81 mg by mouth daily.     Yes Historical Provider, MD  DULoxetine (CYMBALTA) 60 MG capsule TAKE 1 CAPSULE (60 MG TOTAL) BY MOUTH DAILY. 06/12/16  Yes Eulas Post, MD  esomeprazole (NEXIUM) 40 MG capsule TAKE ONE CAPSULE BY MOUTH EVERY DAY BEFORE BREAKFAST 09/22/16  Yes Eulas Post, MD  estradiol (ESTRACE) 0.5 MG tablet TAKE 1 TABLET (0.5 MG TOTAL) BY MOUTH DAILY. 05/23/16  Yes Eulas Post, MD  glucose blood (ONETOUCH VERIO) test strip Use to test blood sugar 4 times a day 08/02/16  Yes Elayne Snare, MD  insulin glargine (LANTUS) 100 UNIT/ML injection USE 30 UNITS DAILY 09/06/16  Yes Elayne Snare, MD  insulin lispro (HUMALOG KWIKPEN) 100 UNIT/ML KiwkPen Inject 0-12 Units into the skin 3 (three) times daily.   Yes Historical Provider, MD  Insulin Pen Needle (BD PEN NEEDLE NANO U/F) 32G X 4 MM MISC Use 3 per to day to inject novolog 07/06/16  Yes Elayne Snare, MD  levalbuterol [facility] HFA) 45 MCG/ACT inhaler Inhale 1-2 puffs into the lungs 4 (four) times daily as needed for shortness of breath.    Yes Historical Provider, MD  losartan (COZAAR)  50 MG tablet Take 1 tablet (50 mg total) by mouth daily. 08/04/16  Yes Eulas Post, MD  metFORMIN (GLUCOPHAGE) 1000 MG tablet TAKE 1 TABLET (1,000 MG TOTAL) BY MOUTH 2 (TWO) TIMES DAILY. 01/19/16  Yes Eulas Post, MD  ONETOUCH DELICA LANCETS FINE MISC Use to check blood sugar 4 times per day. Dx Code E11.9 08/15/16  Yes Elayne Snare, MD  rosuvastatin (CRESTOR) 20 MG tablet TAKE 1 TABLET (20 MG TOTAL) BY MOUTH DAILY. 08/14/16  Yes Elayne Snare, MD  SYNTHROID 125 MCG tablet Take 1 tablet (125 mcg total) by mouth daily before breakfast. 02/16/16  Yes Eulas Post, MD  traMADol Veatrice Bourbon) 50 MG tablet Take one to two tablets every 6 hours prn pain 04/30/15  Yes Eulas Post, MD    Family History Family History  Problem Relation Age of Onset  . Adopted: Yes    Social History Social History    Substance Use Topics  . Smoking status: Never Smoker  . Smokeless tobacco: Never Used  . Alcohol use Yes     Allergies   Oxycodone-acetaminophen and Propoxyphene n-acetaminophen   Review of Systems Review of Systems  Respiratory: Positive for shortness of breath.   Cardiovascular: Positive for chest pain.  Gastrointestinal: Positive for nausea.  Endocrine: Positive for polydipsia and polyuria.  All other systems reviewed and are negative.    Physical Exam Updated Vital Signs BP 140/78   Pulse 104   Temp 98 F (36.7 C) (Oral)   Resp 15   SpO2 97%   Physical Exam  Constitutional: She is oriented to person, place, and time. She appears well-developed and well-nourished.  HENT:  Head: Normocephalic and atraumatic.  Right Ear: External ear normal.  Left Ear: External ear normal.  Nose: Nose normal.  Mouth/Throat: Mucous membranes are dry.  Eyes: Conjunctivae and EOM are normal. Pupils are equal, round, and reactive to light.  Neck: Normal range of motion. Neck supple.  Cardiovascular: Regular rhythm, normal heart sounds and intact distal pulses.  Tachycardia present.   Pulmonary/Chest: Effort normal and breath sounds normal.  Abdominal: Soft. Bowel sounds are normal.  Musculoskeletal: Normal range of motion.  Neurological: She is alert and oriented to person, place, and time.  Skin: Skin is warm. Capillary refill takes less than 2 seconds.  Psychiatric: She has a normal mood and affect. Her behavior is normal. Judgment and thought content normal.  Nursing note and vitals reviewed.    ED Treatments / Results  Labs (all labs ordered are listed, but only abnormal results are displayed) Labs Reviewed  BASIC METABOLIC PANEL - Abnormal; Notable for the following:       Result Value   Sodium 133 (*)    Chloride 95 (*)    Glucose, Bld 378 (*)    Creatinine, Ser 1.03 (*)    GFR calc non Af Amer 57 (*)    All other components within normal limits  CBC - Abnormal;  Notable for the following:    WBC 14.7 (*)    All other components within normal limits  URINALYSIS, ROUTINE W REFLEX MICROSCOPIC - Abnormal; Notable for the following:    APPearance HAZY (*)    Glucose, UA >=500 (*)    Bacteria, UA FEW (*)    Squamous Epithelial / LPF 6-30 (*)    All other components within normal limits  CBG MONITORING, ED - Abnormal; Notable for the following:    Glucose-Capillary 365 (*)    All other components  within normal limits  I-STAT CG4 LACTIC ACID, ED - Abnormal; Notable for the following:    Lactic Acid, Venous 2.41 (*)    All other components within normal limits  LIPASE, BLOOD  D-DIMER, QUANTITATIVE (NOT AT [facility])  I-STAT TROPOININ, ED  CBG MONITORING, ED    EKG  EKG Interpretation  Date/Time:  Monday November 20 2016 18:42:12 EST Ventricular Rate:  125 PR Interval:  124 QRS Duration: 106 QT Interval:  326 QTC Calculation: 470 R Axis:   75 Text Interpretation:  Sinus tachycardia Incomplete right bundle branch block Nonspecific T wave abnormality Abnormal ECG Confirmed by Gilford Raid MD, Venicia Vandall ([id number]) on 11/20/2016 8:50:24 PM       Radiology Dg Chest 2 View  Result Date: 11/20/2016 CLINICAL DATA:  61 year old female with central chest pain nausea and shortness of breath for 1 day. Initial encounter. EXAM: CHEST  2 VIEW COMPARISON:  01/26/2015 and earlier. FINDINGS: Lung volumes are stable and within normal limits. Normal cardiac size and mediastinal contours. Visualized tracheal air column is within normal limits. Lung parenchyma is stable and clear. No pneumothorax or pleural effusion. Negative visible bowel gas pattern. Stable cholecystectomy clips. No acute osseous abnormality identified. IMPRESSION: No acute cardiopulmonary abnormality. Electronically Signed   By: Genevie Ann M.D.   On: 11/20/2016 19:41    Procedures Procedures (including critical care time)  Medications Ordered in ED Medications  nitroGLYCERIN (NITROSTAT) SL tablet 0.4 mg (not  administered)  sodium chloride 0.9 % bolus 1,000 mL (1,000 mLs Intravenous New Bag/Given 11/20/16 2149)  aspirin chewable tablet 324 mg (324 mg Oral Given 11/20/16 2206)  fluconazole (DIFLUCAN) tablet 150 mg (150 mg Oral Given 11/20/16 0150)     Initial Impression / Assessment and Plan / ED Course  I have reviewed the triage vital signs and the nursing notes.  Pertinent labs & imaging results that were available during my care of the patient were reviewed by me and considered in my medical decision making (see chart for details).     No chest pain now.  Pt has a heart score of 4.  Pt without any recent stress or cath.  I think she needs an observation.  Lactic acid is slightly high, likely from dehydration with the hyperglycemia.  Pt given IVFs.  Bs down to 200.  Pt d/w hospitalist for admission.  Final Clinical Impressions(s) / ED Diagnoses   Final diagnoses:  Chest pain, unspecified type  Poorly controlled type 2 diabetes mellitus (HCC)  Lactic acidosis  Vaginal candidiasis    New Prescriptions New Prescriptions   No medications on file     Isla Pence, MD 11/20/16 2235

## 2016-11-21 ENCOUNTER — Encounter (HOSPITAL_COMMUNITY): Payer: Self-pay | Admitting: Internal Medicine

## 2016-11-21 ENCOUNTER — Observation Stay (HOSPITAL_COMMUNITY): Payer: Medicare Other

## 2016-11-21 DIAGNOSIS — E039 Hypothyroidism, unspecified: Secondary | ICD-10-CM | POA: Diagnosis present

## 2016-11-21 DIAGNOSIS — R079 Chest pain, unspecified: Secondary | ICD-10-CM

## 2016-11-21 DIAGNOSIS — Z9049 Acquired absence of other specified parts of digestive tract: Secondary | ICD-10-CM | POA: Diagnosis not present

## 2016-11-21 DIAGNOSIS — I10 Essential (primary) hypertension: Secondary | ICD-10-CM | POA: Diagnosis not present

## 2016-11-21 DIAGNOSIS — E1165 Type 2 diabetes mellitus with hyperglycemia: Secondary | ICD-10-CM | POA: Diagnosis present

## 2016-11-21 DIAGNOSIS — Z794 Long term (current) use of insulin: Secondary | ICD-10-CM | POA: Diagnosis not present

## 2016-11-21 DIAGNOSIS — Z885 Allergy status to narcotic agent status: Secondary | ICD-10-CM | POA: Diagnosis not present

## 2016-11-21 DIAGNOSIS — Z636 Dependent relative needing care at home: Secondary | ICD-10-CM | POA: Diagnosis not present

## 2016-11-21 DIAGNOSIS — R072 Precordial pain: Secondary | ICD-10-CM | POA: Diagnosis present

## 2016-11-21 DIAGNOSIS — F339 Major depressive disorder, recurrent, unspecified: Secondary | ICD-10-CM | POA: Diagnosis present

## 2016-11-21 DIAGNOSIS — B373 Candidiasis of vulva and vagina: Secondary | ICD-10-CM | POA: Diagnosis present

## 2016-11-21 DIAGNOSIS — I452 Bifascicular block: Secondary | ICD-10-CM | POA: Diagnosis present

## 2016-11-21 DIAGNOSIS — Z79890 Hormone replacement therapy (postmenopausal): Secondary | ICD-10-CM | POA: Diagnosis not present

## 2016-11-21 DIAGNOSIS — I2510 Atherosclerotic heart disease of native coronary artery without angina pectoris: Secondary | ICD-10-CM | POA: Diagnosis not present

## 2016-11-21 DIAGNOSIS — G4730 Sleep apnea, unspecified: Secondary | ICD-10-CM | POA: Diagnosis present

## 2016-11-21 DIAGNOSIS — Z8379 Family history of other diseases of the digestive system: Secondary | ICD-10-CM | POA: Diagnosis not present

## 2016-11-21 DIAGNOSIS — M797 Fibromyalgia: Secondary | ICD-10-CM | POA: Diagnosis present

## 2016-11-21 DIAGNOSIS — E872 Acidosis: Secondary | ICD-10-CM | POA: Diagnosis present

## 2016-11-21 DIAGNOSIS — I200 Unstable angina: Secondary | ICD-10-CM

## 2016-11-21 DIAGNOSIS — J45909 Unspecified asthma, uncomplicated: Secondary | ICD-10-CM | POA: Diagnosis present

## 2016-11-21 DIAGNOSIS — Z7901 Long term (current) use of anticoagulants: Secondary | ICD-10-CM | POA: Diagnosis not present

## 2016-11-21 DIAGNOSIS — Z8241 Family history of sudden cardiac death: Secondary | ICD-10-CM | POA: Diagnosis not present

## 2016-11-21 DIAGNOSIS — Z7982 Long term (current) use of aspirin: Secondary | ICD-10-CM | POA: Diagnosis not present

## 2016-11-21 DIAGNOSIS — R740 Nonspecific elevation of levels of transaminase and lactic acid dehydrogenase [LDH]: Secondary | ICD-10-CM | POA: Diagnosis present

## 2016-11-21 DIAGNOSIS — Z8249 Family history of ischemic heart disease and other diseases of the circulatory system: Secondary | ICD-10-CM | POA: Diagnosis not present

## 2016-11-21 DIAGNOSIS — I25110 Atherosclerotic heart disease of native coronary artery with unstable angina pectoris: Secondary | ICD-10-CM | POA: Diagnosis present

## 2016-11-21 DIAGNOSIS — E785 Hyperlipidemia, unspecified: Secondary | ICD-10-CM | POA: Diagnosis present

## 2016-11-21 LAB — BASIC METABOLIC PANEL
ANION GAP: 7 (ref 5–15)
BUN: 18 mg/dL (ref 6–20)
CALCIUM: 9 mg/dL (ref 8.9–10.3)
CO2: 27 mmol/L (ref 22–32)
CREATININE: 0.85 mg/dL (ref 0.44–1.00)
Chloride: 100 mmol/L — ABNORMAL LOW (ref 101–111)
GFR calc Af Amer: >60 mL/min (ref >60)
GLUCOSE: 249 mg/dL — AB (ref 65–99)
Potassium: 3.9 mmol/L (ref 3.5–5.1)
Sodium: 134 mmol/L — ABNORMAL LOW (ref 135–145)

## 2016-11-21 LAB — HEPATIC FUNCTION PANEL
ALBUMIN: 3.3 g/dL — AB (ref 3.5–5.0)
ALT: 32 U/L (ref 14–54)
AST: 33 U/L (ref 15–41)
Alkaline Phosphatase: 106 U/L (ref 38–126)
BILIRUBIN INDIRECT: 0.3 mg/dL (ref 0.3–0.9)
BILIRUBIN TOTAL: 0.4 mg/dL (ref 0.3–1.2)
Bilirubin, Direct: 0.1 mg/dL (ref 0.1–0.5)
TOTAL PROTEIN: 6.3 g/dL — AB (ref 6.5–8.1)

## 2016-11-21 LAB — CBC
HCT: 38.1 % (ref 36.0–46.0)
Hemoglobin: 12.2 g/dL (ref 12.0–15.0)
MCH: 27.7 pg (ref 26.0–34.0)
MCHC: 32 g/dL (ref 30.0–36.0)
MCV: 86.6 fL (ref 78.0–100.0)
PLATELETS: 268 10*3/uL (ref 150–400)
RBC: 4.4 MIL/uL (ref 3.87–5.11)
RDW: 13.9 % (ref 11.5–15.5)
WBC: 13.2 10*3/uL — AB (ref 4.0–10.5)

## 2016-11-21 LAB — GLUCOSE, CAPILLARY
Glucose-Capillary: 285 mg/dL — ABNORMAL HIGH (ref 65–99)
Glucose-Capillary: 229 mg/dL — ABNORMAL HIGH (ref 65–99)
Glucose-Capillary: 224 mg/dL — ABNORMAL HIGH (ref 65–99)
Glucose-Capillary: 259 mg/dL — ABNORMAL HIGH (ref 65–99)
Glucose-Capillary: 283 mg/dL — ABNORMAL HIGH (ref 65–99)

## 2016-11-21 LAB — LACTIC ACID, PLASMA
LACTIC ACID, VENOUS: 1.3 mmol/L (ref 0.5–1.9)
Lactic Acid, Venous: 1.3 mmol/L (ref 0.5–1.9)

## 2016-11-21 LAB — TROPONIN I: Troponin I: <0.03 ng/mL (ref <0.03)

## 2016-11-21 LAB — LIPASE, BLOOD: LIPASE: 22 U/L (ref 11–51)

## 2016-11-21 LAB — HIV ANTIBODY (ROUTINE TESTING W REFLEX): HIV Screen 4th Generation wRfx: NONREACTIVE

## 2016-11-21 MED ORDER — SODIUM CHLORIDE 0.9 % IV SOLN
INTRAVENOUS | Status: AC
  Administered 2016-11-21: 01:00:00 via INTRAVENOUS

## 2016-11-21 MED ORDER — LEVOTHYROXINE SODIUM 25 MCG PO TABS
125.0000 ug | ORAL_TABLET | Freq: Every day | ORAL | Status: DC
  Administered 2016-11-21 – 2016-11-22 (×2): 125 ug via ORAL
  Filled 2016-11-21 (×2): qty 1

## 2016-11-21 MED ORDER — SODIUM CHLORIDE 0.9 % WEIGHT BASED INFUSION: 1.0000 mL/kg/h | INTRAVENOUS | Status: DC

## 2016-11-21 MED ORDER — INSULIN ASPART 100 UNIT/ML ~~LOC~~ SOLN
0.0000 [IU] | Freq: Three times a day (TID) | SUBCUTANEOUS | Status: DC
  Administered 2016-11-21: 5 [IU] via SUBCUTANEOUS
  Administered 2016-11-21 – 2016-11-22 (×4): 3 [IU] via SUBCUTANEOUS
  Administered 2016-11-22: 5 [IU] via SUBCUTANEOUS

## 2016-11-21 MED ORDER — ENOXAPARIN SODIUM 40 MG/0.4ML ~~LOC~~ SOLN
40.0000 mg | SUBCUTANEOUS | Status: DC
  Administered 2016-11-21 – 2016-11-22 (×2): 40 mg via SUBCUTANEOUS
  Filled 2016-11-21 (×2): qty 0.4

## 2016-11-21 MED ORDER — ASPIRIN EC 81 MG PO TBEC
81.0000 mg | DELAYED_RELEASE_TABLET | Freq: Every day | ORAL | Status: DC
Start: 2016-11-23 — End: 2016-11-22
  Filled 2016-11-21: qty 1

## 2016-11-21 MED ORDER — ONDANSETRON HCL 4 MG/2ML IJ SOLN
4.0000 mg | Freq: Four times a day (QID) | INTRAMUSCULAR | Status: DC | PRN
  Administered 2016-11-21: 4 mg via INTRAVENOUS
  Filled 2016-11-21: qty 2

## 2016-11-21 MED ORDER — ASPIRIN 81 MG PO CHEW
81.0000 mg | CHEWABLE_TABLET | ORAL | Status: AC
  Administered 2016-11-22: 81 mg via ORAL
  Filled 2016-11-21: qty 1

## 2016-11-21 MED ORDER — LIVING WELL WITH DIABETES BOOK
Freq: Once | Status: AC
  Administered 2016-11-21: 15:00:00
  Filled 2016-11-21: qty 1

## 2016-11-21 MED ORDER — SODIUM CHLORIDE 0.9% FLUSH
3.0000 mL | Freq: Two times a day (BID) | INTRAVENOUS | Status: DC
  Administered 2016-11-21 – 2016-11-22 (×2): 3 mL via INTRAVENOUS

## 2016-11-21 MED ORDER — LOSARTAN POTASSIUM 50 MG PO TABS
50.0000 mg | ORAL_TABLET | Freq: Every day | ORAL | Status: DC
  Administered 2016-11-21 – 2016-11-22 (×2): 50 mg via ORAL
  Filled 2016-11-21 (×2): qty 1

## 2016-11-21 MED ORDER — SODIUM CHLORIDE 0.9 % IV SOLN: 250.0000 mL | INTRAVENOUS | Status: DC | PRN

## 2016-11-21 MED ORDER — SODIUM CHLORIDE 0.9% FLUSH: 3.0000 mL | INTRAVENOUS | Status: DC | PRN

## 2016-11-21 MED ORDER — ROSUVASTATIN CALCIUM 10 MG PO TABS: 20.0000 mg | ORAL_TABLET | Freq: Every day | ORAL | Status: DC

## 2016-11-21 MED ORDER — LEVALBUTEROL HCL 0.63 MG/3ML IN NEBU
0.6300 mg | INHALATION_SOLUTION | Freq: Four times a day (QID) | RESPIRATORY_TRACT | Status: DC | PRN
Start: 2016-11-21 — End: 2016-11-22

## 2016-11-21 MED ORDER — SODIUM CHLORIDE 0.9 % WEIGHT BASED INFUSION
3.0000 mL/kg/h | INTRAVENOUS | Status: DC
  Administered 2016-11-22: 3 mL/kg/h via INTRAVENOUS

## 2016-11-21 MED ORDER — PANTOPRAZOLE SODIUM 40 MG PO TBEC
40.0000 mg | DELAYED_RELEASE_TABLET | Freq: Every day | ORAL | Status: DC
  Administered 2016-11-21 – 2016-11-22 (×2): 40 mg via ORAL
  Filled 2016-11-21 (×2): qty 1

## 2016-11-21 MED ORDER — ASPIRIN EC 81 MG PO TBEC: 81.0000 mg | DELAYED_RELEASE_TABLET | Freq: Every day | ORAL | Status: DC

## 2016-11-21 MED ORDER — DULOXETINE HCL 60 MG PO CPEP
60.0000 mg | ORAL_CAPSULE | Freq: Every day | ORAL | Status: DC
  Administered 2016-11-21 – 2016-11-22 (×2): 60 mg via ORAL
  Filled 2016-11-21 (×2): qty 1

## 2016-11-21 MED ORDER — ACETAMINOPHEN 325 MG PO TABS
650.0000 mg | ORAL_TABLET | ORAL | Status: DC | PRN
  Administered 2016-11-21: 650 mg via ORAL
  Filled 2016-11-21: qty 2

## 2016-11-21 MED ORDER — ROSUVASTATIN CALCIUM 10 MG PO TABS
40.0000 mg | ORAL_TABLET | Freq: Every day | ORAL | Status: DC
  Administered 2016-11-21 – 2016-11-22 (×2): 40 mg via ORAL
  Filled 2016-11-21 (×2): qty 4

## 2016-11-21 MED ORDER — INSULIN GLARGINE 100 UNIT/ML ~~LOC~~ SOLN
30.0000 [IU] | Freq: Every day | SUBCUTANEOUS | Status: DC
  Administered 2016-11-21 – 2016-11-22 (×2): 30 [IU] via SUBCUTANEOUS
  Filled 2016-11-21 (×2): qty 0.3

## 2016-11-21 MED ORDER — ASPIRIN EC 325 MG PO TBEC
325.0000 mg | DELAYED_RELEASE_TABLET | Freq: Every day | ORAL | Status: DC
  Administered 2016-11-21: 325 mg via ORAL
  Filled 2016-11-21: qty 1

## 2016-11-21 MED ORDER — TRAMADOL HCL 50 MG PO TABS: 50.0000 mg | ORAL_TABLET | Freq: Four times a day (QID) | ORAL | Status: DC | PRN

## 2016-11-21 MED ORDER — ARIPIPRAZOLE 2 MG PO TABS
2.0000 mg | ORAL_TABLET | Freq: Every day | ORAL | Status: DC
  Administered 2016-11-21 (×2): 2 mg via ORAL
  Filled 2016-11-21 (×3): qty 1

## 2016-11-21 NOTE — Progress Notes (Signed)
Patient admitted after midnight, please see H&P.  Last HgbA1C was >12.  Repeat today.  NPO after midnight for planned cardiac cath in AM.  Appreciate cardiology.  Eulogio Bear DO

## 2016-11-21 NOTE — Consult Note (Signed)
CARDIOLOGY CONSULT NOTE   Patient ID: Yesenia Wilson MRN: [id number] DOB/AGE: February 22, 1956 61 y.o.  Admit date: 11/20/2016  Primary Physician   Eulas Post, MD Primary Cardiologist   New to Dr. Angelena Form Reason for Consultation   Chest pain  Requesting Physician  Dr. Eliseo Squires  HPI: Yesenia Wilson is a 61 y.o. female with a history of HTN, HLD, Uncontrolled diabetes, sleep apnea previously  on CPAP (dicontinued after nose surgery), hypothyroidism, family hx of CAD and pancreatitis presents for chest pain.   Never seen by cardiologist. She had a normal stress test for routine evaluation in 2011.  Patient had a episode of substernal chest pain described as a sharp with shortness of breath while attending a funeral service at 2pm. Episode lasted for 3-5 minutes and resolved by itself. She had a recurrent episode around 5 PM with shortness of breath and nausea leading to further evaluation in ER. Patient states that recently she has nausea intermittently for the past few weeks. Tired and fatigued recently with daytime sleepiness. She denies palpitation, orthopnea, PND, syncope, dizziness, melena or blood in her stool or urine.  Strong family history of CAD. Father died of heart attack at age 55. Brother had MI at age 12. Sister has 4 stent placement in her 80s. Nonsmoker. Nondrinker. She has  Sedentary lifestyle. She  is in a lot of stress lately. She is also taking care of her mother who has Alzheimer disease.   EKG shows sinus rhythm at rate of 125 bpm with incomplete chronic left bundle branch block. No acute changes. Elevated lactic acid with resolved with IV fluid. Troponin negative x 2. Electrolytes normal. Normal LFT. Chest x-ray without acute cardiopulmonary disease. Her pressure minimally elevated at presentation to 155/90. Now normalized.    Past Medical History:  Diagnosis Date  . Asthma   . Depression   . Diabetes mellitus   . Fibromyalgia   . Hyperlipidemia   . Hypertension   .  Lupus   . Pancreatitis   . Sleep apnea   . Thyroid disease      Past Surgical History:  Procedure Laterality Date  . APPENDECTOMY    . bladder tack    . CESAREAN SECTION    . CHOLECYSTECTOMY    . SEPTOPLASTY      Allergies  Allergen Reactions  . Oxycodone-Acetaminophen     REACTION: GI upset  . Propoxyphene N-Acetaminophen     REACTION: nausea, rapid heartrate    I have reviewed the patient's current medications . ARIPiprazole  2 mg Oral QHS  . aspirin EC  325 mg Oral Daily  . DULoxetine  60 mg Oral Daily  . enoxaparin (LOVENOX) injection  40 mg Subcutaneous Q24H  . insulin aspart  0-9 Units Subcutaneous TID WC  . insulin glargine  30 Units Subcutaneous Daily  . levothyroxine  125 mcg Oral QAC breakfast  . losartan  50 mg Oral Daily  . pantoprazole  40 mg Oral Daily  . rosuvastatin  20 mg Oral q1800    acetaminophen, levalbuterol, nitroGLYCERIN, ondansetron (ZOFRAN) IV, traMADol  Prior to Admission medications   Medication Sig Start Date End Date Taking? Authorizing Provider  ARIPiprazole (ABILIFY) 2 MG tablet TAKE 1 TABLET (2 MG TOTAL) BY MOUTH AT BEDTIME. 11/17/15  Yes Eulas Post, MD  aspirin 81 MG tablet Take 81 mg by mouth daily.     Yes Historical Provider, MD  DULoxetine (CYMBALTA) 60 MG capsule TAKE 1 CAPSULE (60 MG TOTAL) BY MOUTH DAILY.  06/12/16  Yes Eulas Post, MD  esomeprazole (NEXIUM) 40 MG capsule TAKE ONE CAPSULE BY MOUTH EVERY DAY BEFORE BREAKFAST 09/22/16  Yes Eulas Post, MD  estradiol (ESTRACE) 0.5 MG tablet TAKE 1 TABLET (0.5 MG TOTAL) BY MOUTH DAILY. 05/23/16  Yes Eulas Post, MD  glucose blood (ONETOUCH VERIO) test strip Use to test blood sugar 4 times a day 08/02/16  Yes Elayne Snare, MD  insulin glargine (LANTUS) 100 UNIT/ML injection USE 30 UNITS DAILY 09/06/16  Yes Elayne Snare, MD  insulin lispro (HUMALOG KWIKPEN) 100 UNIT/ML KiwkPen Inject 0-12 Units into the skin 3 (three) times daily.   Yes Historical Provider, MD  Insulin  Pen Needle (BD PEN NEEDLE NANO U/F) 32G X 4 MM MISC Use 3 per to day to inject novolog 07/06/16  Yes Elayne Snare, MD  levalbuterol [facility] HFA) 45 MCG/ACT inhaler Inhale 1-2 puffs into the lungs 4 (four) times daily as needed for shortness of breath.    Yes Historical Provider, MD  losartan (COZAAR) 50 MG tablet Take 1 tablet (50 mg total) by mouth daily. 08/04/16  Yes Eulas Post, MD  metFORMIN (GLUCOPHAGE) 1000 MG tablet TAKE 1 TABLET (1,000 MG TOTAL) BY MOUTH 2 (TWO) TIMES DAILY. 01/19/16  Yes Eulas Post, MD  ONETOUCH DELICA LANCETS FINE MISC Use to check blood sugar 4 times per day. Dx Code E11.9 08/15/16  Yes Elayne Snare, MD  rosuvastatin (CRESTOR) 20 MG tablet TAKE 1 TABLET (20 MG TOTAL) BY MOUTH DAILY. 08/14/16  Yes Elayne Snare, MD  SYNTHROID 125 MCG tablet Take 1 tablet (125 mcg total) by mouth daily before breakfast. 02/16/16  Yes Eulas Post, MD  traMADol Veatrice Bourbon) 50 MG tablet Take one to two tablets every 6 hours prn pain 04/30/15  Yes Eulas Post, MD     Social History   Social History  . Marital status: Single    Spouse name: N/A  . Number of children: N/A  . Years of education: N/A   Occupational History  . Not on file.   Social History Main Topics  . Smoking status: Never Smoker  . Smokeless tobacco: Never Used  . Alcohol use Yes  . Drug use: No  . Sexual activity: Not on file   Other Topics Concern  . Not on file   Social History Narrative  . No narrative on file    Family Status  Relation Status  . Mother   . Father   . Sister   . Brother    Family History  Problem Relation Age of Onset  . Adopted: Yes  . Diabetes Mellitus II Mother   . CAD Father   . Diabetes Mellitus II Father   . CAD Sister   . Diabetes Mellitus II Sister   . CAD Brother   . Diabetes Mellitus II Brother       ROS:  Full 14 point review of systems complete and found to be negative unless listed above.  Physical Exam: Blood pressure 106/63, pulse 83,  temperature 97.9 F (36.6 C), temperature source Oral, resp. rate 15, height 5' (1.524 m), weight 195 lb 3.2 oz (88.5 kg), SpO2 97 %.  General: Well developed, well nourished, female in no acute distress Head: Eyes PERRLA, No xanthomas. Normocephalic and atraumatic, oropharynx without edema or exudate.  Lungs: Resp regular and unlabored, CTA. Heart: RRR no s3, s4, or murmurs..   Neck: No carotid bruits. No lymphadenopathy.  No JVD. Abdomen: Bowel sounds present, abdomen  soft and non-tender without masses or hernias noted. Msk:  No spine or cva tenderness. No weakness, no joint deformities or effusions. Extremities: No clubbing, cyanosis or edema. DP/PT/Radials 2+ and equal bilaterally. Neuro: Alert and oriented X 3. No focal deficits noted. Psych:  Good affect, responds appropriately Skin: No rashes or lesions noted.  Labs:   Lab Results  Component Value Date   WBC 13.2 (H) 11/21/2016   HGB 12.2 11/21/2016   HCT 38.1 11/21/2016   MCV 86.6 11/21/2016   PLT 268 11/21/2016   No results for input(s): INR in the last 72 hours.  Recent Labs Lab 11/21/16 0121  NA 134*  K 3.9  CL 100*  CO2 27  BUN 18  CREATININE 0.85  CALCIUM 9.0  PROT 6.3*  BILITOT 0.4  ALKPHOS 106  ALT 32  AST 33  GLUCOSE 249*  ALBUMIN 3.3*   No results found for: MG  Recent Labs  11/21/16 0121 11/21/16 0630  TROPONINI <0.03 <0.03    Recent Labs  11/20/16 1907  TROPIPOC 0.00   Pro B Natriuretic peptide (BNP)  Date/Time Value Ref Range Status  10/04/2009 04:30 AM 288.0 (H) 0.0 - 100.0 pg/mL Final   Lab Results  Component Value Date   CHOL 266 (H) 08/03/2016   HDL 48.10 08/03/2016   LDLCALC 109 (H) 10/07/2014   TRIG 221.0 (H) 08/03/2016   Lab Results  Component Value Date   DDIMER 0.29 11/20/2016   Lipase  Date/Time Value Ref Range Status  11/21/2016 01:21 AM 22 11 - 51 U/L Final   TSH  Date/Time Value Ref Range Status  07/05/2016 03:35 PM 2.90 0.35 - 4.50 uIU/mL Final   T3,  Free  Date/Time Value Ref Range Status  10/01/2009 05:39 PM 2.3 2.3 - 4.2 pg/mL Final   No results found for: VITAMINB12, FOLATE, FERRITIN, TIBC, IRON, RETICCTPCT  Echo: Pending   Radiology:  Dg Chest 2 View  Result Date: 11/20/2016 CLINICAL DATA:  61 year old female with central chest pain nausea and shortness of breath for 1 day. Initial encounter. EXAM: CHEST  2 VIEW COMPARISON:  01/26/2015 and earlier. FINDINGS: Lung volumes are stable and within normal limits. Normal cardiac size and mediastinal contours. Visualized tracheal air column is within normal limits. Lung parenchyma is stable and clear. No pneumothorax or pleural effusion. Negative visible bowel gas pattern. Stable cholecystectomy clips. No acute osseous abnormality identified. IMPRESSION: No acute cardiopulmonary abnormality. Electronically Signed   By: Genevie Ann M.D.   On: 11/20/2016 19:41    ASSESSMENT AND PLAN:     1. Chest pain  - Seems atypical. Stress test normal in 2011. Troponin negative x 2. EKG without acute changes. She has a chronic right bundle branch block. - Her cardiac risk factor includes hypertension, hyperlipidemia, uncontrolled diabetes and strong family history of early CAD. Pending echocardiogram. Given significant risk factor, she will benefits from cardiac catheterization.   2. Essential hypertension - Stable on current medications     3. Sleep apnea - Previously on CPAP which was discontinued after Nose surgery. Admits having snoring, daytime sleepiness and tiredness. She will benefit from outpatient sleep study evaluation.  4. HLD - 08/03/2016: Cholesterol 266; HDL 48.10; Triglycerides 221.0; VLDL 44.2   LDL 208. - Continue Crestor  5. Type 2 diabetes mellitus with hyperglycemia (HCC) - A1c was 17.3 on 06/2016. Pending this admission. Needs tight control.   6. Obesity -Estimated body mass index is 38.12 kg/m as calculated from the following:   Height as  of this encounter: 5' (1.524 m).    Weight as of this encounter: 195 lb 3.2 oz (88.5 kg).  - Advised regular exercise and weight loss.   SignedLeanor Kail, [place] 11/21/2016, 11:10 AM Pager [phone]  I have personally seen and examined this patient with Mr. [place]. I agree with the assessment and plan as outlined above. She is admitted with chest pain. She has a very strong FH of CAD and personal risk factors for CAD including DM, HTN, HLD, obesity. My exam shows a WDWN female in NAD. CV:RRR, no murmurs. Lungs: clear bilat. Abd: soft, NT, ND. Ext: no edema. Labs reviewed by me. Troponin negative. EKG without ischemic changes. I think cardiac cath is indicated prior to discharge to exclude obstructive CAD. Will allow her to eat today and will plan cardiac cath tomorrow based on today's busy cath schedule. NPO at [facility] tonight for cath tomorrow. Echo is pending today.   Lauree Chandler 11/21/2016 11:48 AM

## 2016-11-21 NOTE — Progress Notes (Signed)
Inpatient Diabetes Program Recommendations  AACE/ADA: New Consensus Statement on Inpatient Glycemic Control (2015)  Target Ranges:  Prepandial:   less than 140 mg/dL      Peak postprandial:   less than 180 mg/dL (1-2 hours)      Critically ill patients:  140 - 180 mg/dL   Lab Results  Component Value Date   GLUCAP 229 (H) 11/21/2016   HGBA1C 17.3 06/26/2016    Review of Glycemic Control Results for Yesenia Wilson, Yesenia Wilson (MRN [id number]) as of 11/21/2016 11:15  Ref. Range 11/20/2016 18:51 11/20/2016 21:43 11/20/2016 23:24 11/21/2016 07:55  Glucose-Capillary Latest Ref Range: 65 - 99 mg/dL 365 (H) 285 (H) 248 (H) 229 (H)   Diabetes history: DM 2 Outpatient Diabetes medications: Lantus 30 units + Humalog 0-12 units tid + Metformin 1 gm bid Current orders for Inpatient glycemic control: Lantus 30 units + Novolog correction 0-9 units tid  Inpatient Diabetes Program Recommendations:  Noted A1c pending. While patient is NPO, please consider increase in Novolog correction to q 4 hrs.  Thank you, Nani Gasser. Ranae Casebier, RN, MSN, CDE Inpatient Glycemic Control Team Team Pager [phone] (8am-5pm) 11/21/2016 11:20 AM

## 2016-11-21 NOTE — Care Management Obs Status (Signed)
[facility] NOTIFICATION   Patient Details  Name: Yesenia Wilson MRN: [id number] Date of Birth: [birth year]   Medicare Observation Status Notification Given:  Yes    Bethena Roys, RN 11/21/2016, 4:26 PM

## 2016-11-21 NOTE — H&P (Signed)
History and Physical    Yesenia Wilson [id number] DOB: 02/26/1956 DOA: 11/20/2016  PCP: Eulas Post, MD  Patient coming from: Home.  Chief Complaint: Chest pain.  HPI: Yesenia Wilson is a 61 y.o. female with history of uncontrolled diabetes mellitus, hypertension, hypothyroidism, hyperlipidemia and pancreatitis started experiencing chest pain after attending a funeral service yesterday at 2 PM. Chest pain or retrosternal pressure-like lasted for 3 minutes along with shortness of breath and resolved without any intervention. Patient's chest pain came back around 5 PM yesterday and persisted and came to the ER.  ED Course: EKG was showing sinus tachycardia with lactate mildly elevated troponin was negative chest x-ray unremarkable. Patient was given fluid bolus and pain resolved with pain relief medications. Patient is being admitted for further chest pain management. Patient also states she has mild epigastric tenderness but denies any vomiting or diarrhea.  Review of Systems: As per HPI, rest all negative.   Past Medical History:  Diagnosis Date  . Asthma   . Depression   . Diabetes mellitus   . Fibromyalgia   . Hyperlipidemia   . Hypertension   . Lupus   . Pancreatitis   . Sleep apnea   . Thyroid disease     Past Surgical History:  Procedure Laterality Date  . APPENDECTOMY    . bladder tack    . CESAREAN SECTION    . CHOLECYSTECTOMY    . SEPTOPLASTY       reports that she has never smoked. She has never used smokeless tobacco. She reports that she drinks alcohol. She reports that she does not use drugs.  Allergies  Allergen Reactions  . Oxycodone-Acetaminophen     REACTION: GI upset  . Propoxyphene N-Acetaminophen     REACTION: nausea, rapid heartrate    Family History  Problem Relation Age of Onset  . Adopted: Yes  . Diabetes Mellitus II Mother   . CAD Father   . Diabetes Mellitus II Father   . CAD Sister   . Diabetes Mellitus II Sister   . CAD  Brother   . Diabetes Mellitus II Brother     Prior to Admission medications   Medication Sig Start Date End Date Taking? Authorizing Provider  ARIPiprazole (ABILIFY) 2 MG tablet TAKE 1 TABLET (2 MG TOTAL) BY MOUTH AT BEDTIME. 11/17/15  Yes Eulas Post, MD  aspirin 81 MG tablet Take 81 mg by mouth daily.     Yes Historical Provider, MD  DULoxetine (CYMBALTA) 60 MG capsule TAKE 1 CAPSULE (60 MG TOTAL) BY MOUTH DAILY. 06/12/16  Yes Eulas Post, MD  esomeprazole (NEXIUM) 40 MG capsule TAKE ONE CAPSULE BY MOUTH EVERY DAY BEFORE BREAKFAST 09/22/16  Yes Eulas Post, MD  estradiol (ESTRACE) 0.5 MG tablet TAKE 1 TABLET (0.5 MG TOTAL) BY MOUTH DAILY. 05/23/16  Yes Eulas Post, MD  glucose blood (ONETOUCH VERIO) test strip Use to test blood sugar 4 times a day 08/02/16  Yes Elayne Snare, MD  insulin glargine (LANTUS) 100 UNIT/ML injection USE 30 UNITS DAILY 09/06/16  Yes Elayne Snare, MD  insulin lispro (HUMALOG KWIKPEN) 100 UNIT/ML KiwkPen Inject 0-12 Units into the skin 3 (three) times daily.   Yes Historical Provider, MD  Insulin Pen Needle (BD PEN NEEDLE NANO U/F) 32G X 4 MM MISC Use 3 per to day to inject novolog 07/06/16  Yes Elayne Snare, MD  levalbuterol [facility] HFA) 45 MCG/ACT inhaler Inhale 1-2 puffs into the lungs 4 (four) times daily as needed for  shortness of breath.    Yes Historical Provider, MD  losartan (COZAAR) 50 MG tablet Take 1 tablet (50 mg total) by mouth daily. 08/04/16  Yes Eulas Post, MD  metFORMIN (GLUCOPHAGE) 1000 MG tablet TAKE 1 TABLET (1,000 MG TOTAL) BY MOUTH 2 (TWO) TIMES DAILY. 01/19/16  Yes Eulas Post, MD  ONETOUCH DELICA LANCETS FINE MISC Use to check blood sugar 4 times per day. Dx Code E11.9 08/15/16  Yes Elayne Snare, MD  rosuvastatin (CRESTOR) 20 MG tablet TAKE 1 TABLET (20 MG TOTAL) BY MOUTH DAILY. 08/14/16  Yes Elayne Snare, MD  SYNTHROID 125 MCG tablet Take 1 tablet (125 mcg total) by mouth daily before breakfast. 02/16/16  Yes Eulas Post,  MD  traMADol Veatrice Bourbon) 50 MG tablet Take one to two tablets every 6 hours prn pain 04/30/15  Yes Eulas Post, MD    Physical Exam: Vitals:   11/20/16 2100 11/20/16 2145 11/20/16 2316 11/20/16 2327  BP: 152/89 140/78  (!) 145/95  Pulse: 111 104  95  Resp: 19 15    Temp:    97.8 F (36.6 C)  TempSrc:    Oral  SpO2: 97% 97%  97%  Weight:   89.1 kg (196 lb 8 oz)   Height:   5' (1.524 m)       Constitutional: Moderately built and nourished. Vitals:   11/20/16 2100 11/20/16 2145 11/20/16 2316 11/20/16 2327  BP: 152/89 140/78  (!) 145/95  Pulse: 111 104  95  Resp: 19 15    Temp:    97.8 F (36.6 C)  TempSrc:    Oral  SpO2: 97% 97%  97%  Weight:   89.1 kg (196 lb 8 oz)   Height:   5' (1.524 m)    Eyes: Anicteric no pallor. ENMT: No discharge from the ears eyes nose and mouth. Neck: No mass felt. No JVD appreciated. Respiratory: No rhonchi or crepitations. Cardiovascular: S1 and S2 heard. Abdomen: Soft nontender bowel sounds present. Musculoskeletal: No edema. No joint effusion. Skin: No rash. Skin appears warm. Neurologic: Alert awake oriented to time place and person. Psychiatric: Moves all extremities. Appears normal. Normal affect.   Labs on Admission: I have personally reviewed following labs and imaging studies  CBC:  Recent Labs Lab 11/20/16 1849  WBC 14.7*  HGB 14.0  HCT 42.8  MCV 86.3  PLT 123XX123   Basic Metabolic Panel:  Recent Labs Lab 11/20/16 1849  NA 133*  K 4.0  CL 95*  CO2 24  GLUCOSE 378*  BUN 17  CREATININE 1.03*  CALCIUM 9.8   GFR: Estimated Creatinine Clearance: 56.2 mL/min (by C-G formula based on SCr of 1.03 mg/dL (H)). Liver Function Tests: No results for input(s): AST, ALT, ALKPHOS, BILITOT, PROT, ALBUMIN in the last 168 hours.  Recent Labs Lab 11/20/16 1849  LIPASE 23   No results for input(s): AMMONIA in the last 168 hours. Coagulation Profile: No results for input(s): INR, PROTIME in the last 168 hours. Cardiac  Enzymes: No results for input(s): CKTOTAL, CKMB, CKMBINDEX, TROPONINI in the last 168 hours. BNP (last 3 results) No results for input(s): PROBNP in the last 8760 hours. HbA1C: No results for input(s): HGBA1C in the last 72 hours. CBG:  Recent Labs Lab 11/20/16 1851 11/20/16 2324  GLUCAP 365* 248*   Lipid Profile: No results for input(s): CHOL, HDL, LDLCALC, TRIG, CHOLHDL, LDLDIRECT in the last 72 hours. Thyroid Function Tests: No results for input(s): TSH, T4TOTAL, FREET4, T3FREE, THYROIDAB in  the last 72 hours. Anemia Panel: No results for input(s): VITAMINB12, FOLATE, FERRITIN, TIBC, IRON, RETICCTPCT in the last 72 hours. Urine analysis:    Component Value Date/Time   COLORURINE YELLOW 11/20/2016 2100   APPEARANCEUR HAZY (A) 11/20/2016 2100   LABSPEC 1.030 11/20/2016 2100   PHURINE 5.0 11/20/2016 2100   GLUCOSEU >=500 (A) 11/20/2016 2100   HGBUR NEGATIVE 11/20/2016 2100   HGBUR negative 03/24/2010 0948   BILIRUBINUR NEGATIVE 11/20/2016 2100   BILIRUBINUR 1+ 07/28/2016 [place] 11/20/2016 2100   PROTEINUR NEGATIVE 11/20/2016 2100   UROBILINOGEN 1.0 07/28/2016 1045   UROBILINOGEN 1.0 03/02/2015 1628   NITRITE NEGATIVE 11/20/2016 2100   LEUKOCYTESUR NEGATIVE 11/20/2016 2100   Sepsis Labs: @LABRCNTIP (procalcitonin:4,lacticidven:4) )No results found for this or any previous visit (from the past 240 hour(s)).   Radiological Exams on Admission: Dg Chest 2 View  Result Date: 11/20/2016 CLINICAL DATA:  61 year old female with central chest pain nausea and shortness of breath for 1 day. Initial encounter. EXAM: CHEST  2 VIEW COMPARISON:  01/26/2015 and earlier. FINDINGS: Lung volumes are stable and within normal limits. Normal cardiac size and mediastinal contours. Visualized tracheal air column is within normal limits. Lung parenchyma is stable and clear. No pneumothorax or pleural effusion. Negative visible bowel gas pattern. Stable cholecystectomy clips. No  acute osseous abnormality identified. IMPRESSION: No acute cardiopulmonary abnormality. Electronically Signed   By: Genevie Ann M.D.   On: 11/20/2016 19:41    EKG: Independently reviewed. Sinus tachycardia.  Assessment/Plan Principal Problem:   Chest pain Active Problems:   Diabetes type 2, uncontrolled (HCC)   Essential hypertension   Sleep apnea   Type 2 diabetes mellitus with hyperglycemia ([place])    1. Chest pain - given the history of diabetes hypertension and hyperlipidemia we will cycle cardiac markers to rule out ACS. Will keep patient nothing by mouth in a.m. in anticipation of cardiac procedure. Patient will be on aspirin continue statins. Since patient had epigastric discomfort and with history of pancreatitis recently will check lipase and LFTs. Check 2-D echo. 2. Diabetes mellitus type 2 uncontrolled - patient is on Lantus insulin with sliding scale coverage. Closely monitor CBGs once patient becomes nothing by mouth in a.m. Continue with IV fluids. Follow metabolic panel. 3. Hypertension - on Cozaar. 4. Hyperlipidemia on statins. 5. Hypothyroidism on Synthroid.   DVT prophylaxis: Lovenox. Code Status: Full code.  Family Communication: Discussed with patient.  Disposition Plan: Home.  Consults called: None.  Admission status: Observation.    Rise Patience MD [facility] Pager [phone].  If 7PM-7AM, please contact night-coverage [email or web address] Password [email or web address]  11/21/2016, 12:48 AM

## 2016-11-22 ENCOUNTER — Inpatient Hospital Stay (HOSPITAL_COMMUNITY): Payer: Medicare Other

## 2016-11-22 ENCOUNTER — Encounter (HOSPITAL_COMMUNITY)
Admission: EM | Disposition: A | Discharge status: Home / Self Care | Payer: Self-pay | Source: Home / Self Care | Attending: Internal Medicine

## 2016-11-22 DIAGNOSIS — I10 Essential (primary) hypertension: Secondary | ICD-10-CM

## 2016-11-22 DIAGNOSIS — I200 Unstable angina: Secondary | ICD-10-CM

## 2016-11-22 DIAGNOSIS — I2510 Atherosclerotic heart disease of native coronary artery without angina pectoris: Secondary | ICD-10-CM

## 2016-11-22 HISTORY — PX: LEFT HEART CATH AND CORONARY ANGIOGRAPHY: CATH118249

## 2016-11-22 LAB — HEMOGLOBIN A1C
Hgb A1c MFr Bld: 10.9 % — ABNORMAL HIGH (ref 4.8–5.6)
MEAN PLASMA GLUCOSE: 266 mg/dL

## 2016-11-22 LAB — GLUCOSE, CAPILLARY
GLUCOSE-CAPILLARY: 223 mg/dL — AB (ref 65–99)
Glucose-Capillary: 300 mg/dL — ABNORMAL HIGH (ref 65–99)
Glucose-Capillary: 230 mg/dL — ABNORMAL HIGH (ref 65–99)

## 2016-11-22 LAB — PROTIME-INR
INR: 0.95
PROTHROMBIN TIME: 12.6 s (ref 11.4–15.2)

## 2016-11-22 SURGERY — LEFT HEART CATH AND CORONARY ANGIOGRAPHY
Anesthesia: LOCAL

## 2016-11-22 MED ORDER — HEPARIN (PORCINE) IN NACL 2-0.9 UNIT/ML-% IJ SOLN
INTRAMUSCULAR | Status: AC
  Filled 2016-11-22: qty 1000

## 2016-11-22 MED ORDER — LIDOCAINE HCL (PF) 1 % IJ SOLN
INTRAMUSCULAR | Status: DC | PRN
  Administered 2016-11-22: 3 mL

## 2016-11-22 MED ORDER — HEPARIN (PORCINE) IN NACL 2-0.9 UNIT/ML-% IJ SOLN
INTRAMUSCULAR | Status: DC | PRN
  Administered 2016-11-22: 1000 mL

## 2016-11-22 MED ORDER — MIDAZOLAM HCL 2 MG/2ML IJ SOLN
INTRAMUSCULAR | Status: AC
  Filled 2016-11-22: qty 2

## 2016-11-22 MED ORDER — MIDAZOLAM HCL 2 MG/2ML IJ SOLN
INTRAMUSCULAR | Status: DC | PRN
  Administered 2016-11-22: 2 mg via INTRAVENOUS

## 2016-11-22 MED ORDER — SODIUM CHLORIDE 0.9% FLUSH: 3.0000 mL | INTRAVENOUS | Status: DC | PRN

## 2016-11-22 MED ORDER — SODIUM CHLORIDE 0.9 % IV SOLN: 250.0000 mL | INTRAVENOUS | Status: DC | PRN

## 2016-11-22 MED ORDER — FENTANYL CITRATE (PF) 100 MCG/2ML IJ SOLN
INTRAMUSCULAR | Status: AC
  Filled 2016-11-22: qty 2

## 2016-11-22 MED ORDER — SODIUM CHLORIDE 0.9% FLUSH: 3.0000 mL | Freq: Two times a day (BID) | INTRAVENOUS | Status: DC

## 2016-11-22 MED ORDER — FENTANYL CITRATE (PF) 100 MCG/2ML IJ SOLN
INTRAMUSCULAR | Status: DC | PRN
  Administered 2016-11-22: 50 ug via INTRAVENOUS

## 2016-11-22 MED ORDER — HEPARIN SODIUM (PORCINE) 1000 UNIT/ML IJ SOLN
INTRAMUSCULAR | Status: AC
  Filled 2016-11-22: qty 1

## 2016-11-22 MED ORDER — VERAPAMIL HCL 2.5 MG/ML IV SOLN
INTRAVENOUS | Status: DC | PRN
  Administered 2016-11-22: 10 mL via INTRA_ARTERIAL

## 2016-11-22 MED ORDER — VERAPAMIL HCL 2.5 MG/ML IV SOLN
INTRAVENOUS | Status: AC
  Filled 2016-11-22: qty 2

## 2016-11-22 MED ORDER — HEPARIN SODIUM (PORCINE) 1000 UNIT/ML IJ SOLN
INTRAMUSCULAR | Status: DC | PRN
  Administered 2016-11-22: 4500 [IU] via INTRAVENOUS

## 2016-11-22 MED ORDER — IOPAMIDOL (ISOVUE-370) INJECTION 76%
INTRAVENOUS | Status: DC | PRN
  Administered 2016-11-22: 70 mL via INTRAVENOUS

## 2016-11-22 MED ORDER — LIVING WELL WITH DIABETES BOOK
Freq: Once | Status: AC
  Administered 2016-11-22: 10:00:00
  Filled 2016-11-22: qty 1

## 2016-11-22 MED ORDER — IOPAMIDOL (ISOVUE-370) INJECTION 76%
INTRAVENOUS | Status: AC
  Filled 2016-11-22: qty 100

## 2016-11-22 MED ORDER — SODIUM CHLORIDE 0.9 % IV SOLN: INTRAVENOUS | Status: DC

## 2016-11-22 MED ORDER — INSULIN GLARGINE 100 UNIT/ML ~~LOC~~ SOLN: SUBCUTANEOUS | 2 refills | Status: DC

## 2016-11-22 MED ORDER — LIDOCAINE HCL (PF) 1 % IJ SOLN
INTRAMUSCULAR | Status: AC
  Filled 2016-11-22: qty 30

## 2016-11-22 SURGICAL SUPPLY — 12 items
CATH EXPO 5FR ANG PIGTAIL 145 (CATHETERS) ×2 IMPLANT
CATH EXPO 5FR FR4 (CATHETERS) ×2 IMPLANT
CATH INFINITI 5 FR JL3.5 (CATHETERS) ×2 IMPLANT
DEVICE RAD COMP TR BAND LRG (VASCULAR PRODUCTS) ×2 IMPLANT
GLIDESHEATH SLEND SS 6F .021 (SHEATH) ×2 IMPLANT
GUIDEWIRE INQWIRE 1.5J.035X260 (WIRE) ×1 IMPLANT
INQWIRE 1.5J .035X260CM (WIRE) ×2
KIT HEART LEFT (KITS) ×2 IMPLANT
PACK CARDIAC CATHETERIZATION (CUSTOM PROCEDURE TRAY) ×2 IMPLANT
SYR MEDRAD MARK V 150ML (SYRINGE) ×2 IMPLANT
TRANSDUCER W/STOPCOCK (MISCELLANEOUS) ×2 IMPLANT
TUBING CIL FLEX 10 FLL-RA (TUBING) ×2 IMPLANT

## 2016-11-22 NOTE — Plan of Care (Signed)
Problem: Health Behavior/Discharge Planning: Goal: Ability to manage health-related needs will improve Outcome: Progressing Pt provided with booklet "Living with Diabetes", and printed materials about diabetes.  Pt has read educational materials and has made plans to  Improve her health from now on.

## 2016-11-22 NOTE — Interval H&P Note (Signed)
History and Physical Interval Note:  11/22/2016 2:36 PM  Yesenia Wilson  has presented today for cardiac cath with the diagnosis of chest pain/unstable angina.  The various methods of treatment have been discussed with the patient and family. After consideration of risks, benefits and other options for treatment, the patient has consented to  Procedure(s): Left Heart Cath and Coronary Angiography (N/A) as a surgical intervention .  The patient's history has been reviewed, patient examined, no change in status, stable for surgery.  I have reviewed the patient's chart and labs.  Questions were answered to the patient's satisfaction.    Cath Lab Visit (complete for each Cath Lab visit)  Clinical Evaluation Leading to the Procedure:   ACS: No.  Non-ACS:    Anginal Classification: CCS III  Anti-ischemic medical therapy: No Therapy  Non-Invasive Test Results: No non-invasive testing performed  Prior CABG: No previous CABG         Lauree Chandler

## 2016-11-22 NOTE — Discharge Summary (Signed)
Physician Discharge Summary  Yesenia Wilson [id number] DOB: 1956-06-25 DOA: 11/20/2016  PCP: Eulas Post, MD  Admit date: 11/20/2016 Discharge date: 11/22/2016   Recommendations for Outpatient Follow-Up:   1. Continued tight control of DM 2. FLP as outpatient 3. Echo pending read but cath EF preserved   Discharge Diagnosis:   Principal Problem:   Chest pain Active Problems:   Poorly controlled type 2 diabetes mellitus ([place])   Essential hypertension   Sleep apnea   Type 2 diabetes mellitus with hyperglycemia ([place])   Unstable angina [facility])   Discharge disposition:  Home.  Discharge Condition: Improved.  Diet recommendation: Low sodium, heart healthy.  Carbohydrate-modified.   Wound care: None.   History of Present Illness:   Yesenia Wilson is a 61 y.o. female with history of uncontrolled diabetes mellitus, hypertension, hypothyroidism, hyperlipidemia and pancreatitis started experiencing chest pain after attending a funeral service yesterday at 2 PM. Chest pain or retrosternal pressure-like lasted for 3 minutes along with shortness of breath and resolved without any intervention. Patient's chest pain came back around 5 PM yesterday and persisted and came to the ER.   Hospital Course by Problem:   Chest pain  -s/p cath: Mid RCA lesion, 30 %stenosed.  Ost Cx to Prox Cx lesion, 30 %stenosed.  The left ventricular systolic function is normal.  LV end diastolic pressure is normal.  The left ventricular ejection fraction is 50-55% by visual estimate.  There is no mitral valve regurgitation.  Mid LAD lesion, 30 %stenosed.  Mild non-obstructive CAD  Normal LV systolic function -d dimer negative -CE negative  Diabetes mellitus type 2 uncontrolled -HgbA1C improved from 17 to 10 -increase lantus -outpatient follow up  Hypertension - on Cozaar  Hyperlipidemia on statins.  Hypothyroidism on Synthroid    Medical Consultants:    cards   Discharge  Exam:   Vitals:   11/22/16 1518 11/22/16 1523  BP: 135/83   Pulse: (!) 147 (!) 0  Resp: (!) 7 18  Temp:     Vitals:   11/22/16 1509 11/22/16 1513 11/22/16 1518 11/22/16 1523  BP: (!) 143/78 (!) 145/82 135/83   Pulse: (!) 101 100 (!) 147 (!) 0  Resp: 15 18 (!) 7 18  Temp:      TempSrc:      SpO2: 96% (!) 0% (!) 0% (!) 0%  Weight:      Height:          The results of significant diagnostics from this hospitalization (including imaging, microbiology, ancillary and laboratory) are listed below for reference.     Procedures and Diagnostic Studies:   Dg Chest 2 View  Result Date: 11/20/2016 CLINICAL DATA:  61 year old female with central chest pain nausea and shortness of breath for 1 day. Initial encounter. EXAM: CHEST  2 VIEW COMPARISON:  01/26/2015 and earlier. FINDINGS: Lung volumes are stable and within normal limits. Normal cardiac size and mediastinal contours. Visualized tracheal air column is within normal limits. Lung parenchyma is stable and clear. No pneumothorax or pleural effusion. Negative visible bowel gas pattern. Stable cholecystectomy clips. No acute osseous abnormality identified. IMPRESSION: No acute cardiopulmonary abnormality. Electronically Signed   By: Genevie Ann M.D.   On: 11/20/2016 19:41     Labs:   Basic Metabolic Panel:  Recent Labs Lab 11/20/16 1849 11/21/16 0121  NA 133* 134*  K 4.0 3.9  CL 95* 100*  CO2 24 27  GLUCOSE 378* 249*  BUN 17 18  CREATININE 1.03* 0.85  CALCIUM  9.8 9.0   GFR Estimated Creatinine Clearance: 67.5 mL/min (by C-G formula based on SCr of 0.85 mg/dL). Liver Function Tests:  Recent Labs Lab 11/21/16 0121  AST 33  ALT 32  ALKPHOS 106  BILITOT 0.4  PROT 6.3*  ALBUMIN 3.3*    Recent Labs Lab 11/20/16 1849 11/21/16 0121  LIPASE 23 22   No results for input(s): AMMONIA in the last 168 hours. Coagulation profile  Recent Labs Lab 11/22/16 0047  INR 0.95    CBC:  Recent Labs Lab 11/20/16 1849  11/21/16 0121  WBC 14.7* 13.2*  HGB 14.0 12.2  HCT 42.8 38.1  MCV 86.3 86.6  PLT 340 268   Cardiac Enzymes:  Recent Labs Lab 11/21/16 0121 11/21/16 0630 11/21/16 1140  TROPONINI <0.03 <0.03 <0.03   BNP: Invalid input(s): POCBNP CBG:  Recent Labs Lab 11/21/16 1135 11/21/16 1611 11/21/16 2215 11/22/16 0823 11/22/16 1122  GLUCAP 224* 259* 283* 300* 230*   D-Dimer  Recent Labs  11/20/16 2140  DDIMER 0.29   Hgb A1c  Recent Labs  11/21/16 0900  HGBA1C 10.9*   Lipid Profile No results for input(s): CHOL, HDL, LDLCALC, TRIG, CHOLHDL, LDLDIRECT in the last 72 hours. Thyroid function studies No results for input(s): TSH, T4TOTAL, T3FREE, THYROIDAB in the last 72 hours.  Invalid input(s): FREET3 Anemia work up No results for input(s): VITAMINB12, FOLATE, FERRITIN, TIBC, IRON, RETICCTPCT in the last 72 hours. Microbiology No results found for this or any previous visit (from the past 240 hour(s)).   Discharge Instructions:   Discharge Instructions    Diet - low sodium heart healthy    Complete by:  As directed    Diet Carb Modified    Complete by:  As directed    Discharge instructions    Complete by:  As directed    Keep log of blood sugars and bring to PCP   Increase activity slowly    Complete by:  As directed      Allergies as of 11/22/2016      Reactions   Oxycodone-acetaminophen    REACTION: GI upset   Propoxyphene N-acetaminophen    REACTION: nausea, rapid heartrate      Medication List    TAKE these medications   ARIPiprazole 2 MG tablet Commonly known as:  ABILIFY TAKE 1 TABLET (2 MG TOTAL) BY MOUTH AT BEDTIME.   aspirin 81 MG tablet Take 81 mg by mouth daily.   DULoxetine 60 MG capsule Commonly known as:  CYMBALTA TAKE 1 CAPSULE (60 MG TOTAL) BY MOUTH DAILY.   esomeprazole 40 MG capsule Commonly known as:  NEXIUM TAKE ONE CAPSULE BY MOUTH EVERY DAY BEFORE BREAKFAST   estradiol 0.5 MG tablet Commonly known as:  ESTRACE TAKE  1 TABLET EVERY DAY What changed:  See the new instructions.   glucose blood test strip Commonly known as:  ONETOUCH VERIO Use to test blood sugar 4 times a day   HUMALOG KWIKPEN 100 UNIT/ML KiwkPen Generic drug:  insulin lispro Inject 0-12 Units into the skin 3 (three) times daily.   insulin glargine 100 UNIT/ML injection Commonly known as:  LANTUS USE 37 UNITS DAILY What changed:  additional instructions   Insulin Pen Needle 32G X 4 MM Misc Commonly known as:  BD PEN NEEDLE NANO U/F Use 3 per to day to inject novolog   losartan 50 MG tablet Commonly known as:  COZAAR Take 1 tablet (50 mg total) by mouth daily.   metFORMIN 1000 MG tablet Commonly  known as:  GLUCOPHAGE TAKE 1 TABLET (1,000 MG TOTAL) BY MOUTH 2 (TWO) TIMES DAILY.   ONETOUCH DELICA LANCETS FINE Misc Use to check blood sugar 4 times per day. Dx Code E11.9   rosuvastatin 20 MG tablet Commonly known as:  CRESTOR TAKE 1 TABLET (20 MG TOTAL) BY MOUTH DAILY.   SYNTHROID 125 MCG tablet Generic drug:  levothyroxine TAKE 1 TABLET (125 MCG TOTAL) BY MOUTH DAILY BEFORE BREAKFAST.   traMADol 50 MG tablet Commonly known as:  ULTRAM Take one to two tablets every 6 hours prn pain   XOPENEX HFA 45 MCG/ACT inhaler Generic drug:  levalbuterol Inhale 1-2 puffs into the lungs 4 (four) times daily as needed for shortness of breath.         Time coordinating discharge: 35 min  Signed:  [facility] 11/22/2016, 4:31 PM

## 2016-11-22 NOTE — Progress Notes (Signed)
Pt discharged to home, condition stable, education complete, Edward Qualia RN

## 2016-11-22 NOTE — Progress Notes (Addendum)
Inpatient Diabetes Program Recommendations  AACE/ADA: New Consensus Statement on Inpatient Glycemic Control (2015)  Target Ranges:  Prepandial:   less than 140 mg/dL      Peak postprandial:   less than 180 mg/dL (1-2 hours)      Critically ill patients:  140 - 180 mg/dL   Lab Results  Component Value Date   GLUCAP 300 (H) 11/22/2016   HGBA1C 10.9 (H) 11/21/2016    Review of Glycemic Control  Diabetes history: DM2 Outpatient Diabetes medications: Lantus 30 units + Humalog 0-12 units tid + Metformin 1 gm bid Current orders for Inpatient glycemic control: Lantus 30 units + Novolog correction 0-9 units tid  Inpatient Diabetes Program Recommendations:    CBGs elevated. Please consider: -Increase in Novolog correction to 0-15 q 4 hrs. While NPO -Add Novolog 6 units tid meal coverage when eating >50%  Patient was last seen by Dr. Dwyane Dee for DM management 08/03/16. Dr. Dwyane Dee states patient's A1c was > 17.3 06/26/16 and currently 10.9.  Spoke with pt @ bedside about A1C and explained what an A1C is, basic pathophysiology of DM Type 2, basic home care, basic diabetes diet nutrition principles, importance of checking CBGs and maintaining good CBG control to prevent long-term and short-term complications. Reviewed signs and symptoms of hyperglycemia and hypoglycemia and how to treat hypoglycemia at home. Also reviewed blood sugar goals at home.  RNs to provide ongoing basic DM education at bedside with this patient. Have ordered educational booklet and DM videos. Have also given basic plate method handout for patient to use. Patient plans to scheduled outpatient DM education @ Dr. Ronnie Derby office.Reviewed with patient importance of taking medications as prescribed along with taking meter/log of CBGs to MD visits for medication management.  Thank you, Yesenia Wilson. Yesenia Bruhl, RN, MSN, CDE Inpatient Glycemic Control Team Team Pager [phone] (8am-5pm) 11/22/2016 10:20 AM

## 2016-11-22 NOTE — H&P (View-Only) (Signed)
Progress Note  Patient Name: Yesenia Wilson Date of Encounter: 11/22/2016  Primary Cardiologist: New Dr. Angelena Form  Subjective   No chest pain and no SOB  Inpatient Medications    Scheduled Meds: . ARIPiprazole  2 mg Oral QHS  . [START ON 11/23/2016] aspirin EC  81 mg Oral Daily  . DULoxetine  60 mg Oral Daily  . enoxaparin (LOVENOX) injection  40 mg Subcutaneous Q24H  . insulin aspart  0-9 Units Subcutaneous TID WC  . insulin glargine  30 Units Subcutaneous Daily  . levothyroxine  125 mcg Oral QAC breakfast  . living well with diabetes book   Does not apply Once  . losartan  50 mg Oral Daily  . pantoprazole  40 mg Oral Daily  . rosuvastatin  40 mg Oral q1800  . sodium chloride flush  3 mL Intravenous Q12H   Continuous Infusions: . sodium chloride 1 mL/kg/hr (11/22/16 0655)   PRN Meds: sodium chloride, acetaminophen, levalbuterol, nitroGLYCERIN, ondansetron (ZOFRAN) IV, sodium chloride flush, traMADol   Vital Signs    Vitals:   11/21/16 0500 11/21/16 1500 11/21/16 2229 11/22/16 0517  BP: 106/63 (!) 142/80 138/65 133/69  Pulse: 83 93 100 85  Resp:      Temp: 97.9 F (36.6 C) 97.7 F (36.5 C) 98.2 F (36.8 C) 98.2 F (36.8 C)  TempSrc: Oral Oral Oral Oral  SpO2: 97% 98% 100% 96%  Weight: 195 lb 3.2 oz (88.5 kg)   192 lb 14.4 oz (87.5 kg)  Height:        Intake/Output Summary (Last 24 hours) at 11/22/16 0939 Last data filed at 11/22/16 0600  Gross per 24 hour  Intake          1399.65 ml  Output             2500 ml  Net         -1100.35 ml   Filed Weights   11/20/16 2316 11/21/16 0500 11/22/16 0517  Weight: 196 lb 8 oz (89.1 kg) 195 lb 3.2 oz (88.5 kg) 192 lb 14.4 oz (87.5 kg)    Telemetry    SR to ST - Personally Reviewed  ECG    No new but ST inappropriate ST  - Personally Reviewed  Physical Exam   GEN: No acute distress.   Neck: No JVD Cardiac: RRR, no murmurs, rubs, or gallops.  Respiratory: Clear except for end expiratory wheeze to  auscultation bilaterally. GI: Soft, nontender, non-distended  MS: No edema; No deformity. Neuro:  Nonfocal  Psych: Normal affect   Labs    Chemistry Recent Labs Lab 11/20/16 1849 11/21/16 0121  NA 133* 134*  K 4.0 3.9  CL 95* 100*  CO2 24 27  GLUCOSE 378* 249*  BUN 17 18  CREATININE 1.03* 0.85  CALCIUM 9.8 9.0  PROT  --  6.3*  ALBUMIN  --  3.3*  AST  --  33  ALT  --  32  ALKPHOS  --  106  BILITOT  --  0.4  GFRNONAA 57* >60  GFRAA >60 >60  ANIONGAP 14 7     Hematology Recent Labs Lab 11/20/16 1849 11/21/16 0121  WBC 14.7* 13.2*  RBC 4.96 4.40  HGB 14.0 12.2  HCT 42.8 38.1  MCV 86.3 86.6  MCH 28.2 27.7  MCHC 32.7 32.0  RDW 14.0 13.9  PLT 340 268    Cardiac Enzymes Recent Labs Lab 11/21/16 0121 11/21/16 0630 11/21/16 1140  TROPONINI <0.03 <0.03 <0.03  Recent Labs Lab 11/20/16 1907  TROPIPOC 0.00     BNPNo results for input(s): BNP, PROBNP in the last 168 hours.   DDimer  Recent Labs Lab 11/20/16 2140  DDIMER 0.29     Radiology    Dg Chest 2 View  Result Date: 11/20/2016 CLINICAL DATA:  61 year old female with central chest pain nausea and shortness of breath for 1 day. Initial encounter. EXAM: CHEST  2 VIEW COMPARISON:  01/26/2015 and earlier. FINDINGS: Lung volumes are stable and within normal limits. Normal cardiac size and mediastinal contours. Visualized tracheal air column is within normal limits. Lung parenchyma is stable and clear. No pneumothorax or pleural effusion. Negative visible bowel gas pattern. Stable cholecystectomy clips. No acute osseous abnormality identified. IMPRESSION: No acute cardiopulmonary abnormality. Electronically Signed   By: Genevie Ann M.D.   On: 11/20/2016 19:41    Cardiac Studies   Cath today  Echo P  Patient Profile     61 y.o. female with a history of HTN, HLD, Uncontrolled diabetes, sleep apnea previously  on CPAP (dicontinued after nose surgery), hypothyroidism, family hx of CAD and pancreatitis  presents for chest pain.     Assessment & Plan    1. Chest pain  - Seems atypical. Stress test normal in 2011. Troponin negative x 3. EKG without acute changes. She has a chronic right bundle branch block. - Her cardiac risk factor includes hypertension, hyperlipidemia, uncontrolled diabetes and strong family history of early CAD. Pending echocardiogram. Given significant risk factor, she will benefits from cardiac catheterization.   2. Essential hypertension - Stable on current medications with BP 138-133/65-69     3. Sleep apnea - Previously on CPAP which was discontinued after Nose surgery. Admits having snoring, daytime sleepiness and tiredness. She will benefit from outpatient sleep study evaluation.  4. HLD - 08/03/2016: Cholesterol 266; HDL 48.10; Triglycerides 221.0; VLDL 44.2   LDL 208. - Continue Crestor  5. Type 2 diabetes mellitus with hyperglycemia (HCC) - A1c was 17.3 on 06/2016. HGB A1C is 10.9 this admit. Needs tight control.  - here glucose running 220-300  6. Obesity -Estimated body mass index is 38.12 kg/m as calculated from the following:   Height as of this encounter: 5' (1.524 m).   Weight as of this encounter: 195 lb 3.2 oz (88.5 kg).  - Advised regular exercise and weight loss on admit.   7.  Tachycardia had been on BB until 6 months ago stopped with pancreatitis - she does become SOB with Tachycardia   Signed, Cecilie Kicks, NP  11/22/2016, 9:39 AM    Patient seen, examined. Available data reviewed. Agree with findings, assessment, and plan as outlined by Cecilie Kicks, NP. The patient is independently interviewed and examined. Heart is regular rate and rhythm. Lungs are clear bilaterally. Right radial pulses 2+. There is no peripheral edema. All data is reviewed. The patient is at high risk for acute coronary syndrome with poorly controlled hypercholesterolemia, diabetes, obesity, sleep apnea, and strong family history of coronary artery disease. I  agree cardiac catheterization and possible angioplasty/stent in her indicated. I have reviewed the risks, indications, and alternatives to cardiac catheterization, possible angioplasty, and stenting with the patient. Risks include but are not limited to bleeding, infection, vascular injury, stroke, myocardial infection, arrhythmia, kidney injury, radiation-related injury in the case of prolonged fluoroscopy use, emergency cardiac surgery, and death. The patient understands the risks of serious complication is 1-2 in 3220 with diagnostic cardiac cath and 1-2% or less with angioplasty/stenting.  All questions are answered. The patient is on the schedule this afternoon. Current medicines are reviewed and will be continued.  Sherren Mocha, M.D. 11/22/2016 11:44 AM

## 2016-11-22 NOTE — Progress Notes (Signed)
PROGRESS NOTE    Yesenia Wilson  WVP:[id number] DOB: December 31, 1955 DOA: 11/20/2016 PCP: Eulas Post, MD   Outpatient Specialists:     Brief Narrative:  Yesenia Wilson is a 61 y.o. female with history of uncontrolled diabetes mellitus, hypertension, hypothyroidism, hyperlipidemia and pancreatitis started experiencing chest pain after attending a funeral service yesterday at 2 PM. Chest pain or retrosternal pressure-like lasted for 3 minutes along with shortness of breath and resolved without any intervention. Patient's chest pain came back around 5 PM yesterday and persisted and came to the ER. Seen by cardiology and plan is for [facility] 11/22/16.    Assessment & Plan:   Principal Problem:   Chest pain Active Problems:   Poorly controlled type 2 diabetes mellitus ([facility])   Essential hypertension   Sleep apnea   Type 2 diabetes mellitus with hyperglycemia (HCC)   Unstable angina (HCC)   Chest pain  -cath 11/22/16 PM -d dimer negative -CE negative  Diabetes mellitus type 2 uncontrolled -HgbA1C improved from 17 to 10 -increase lantus -SSI  Hypertension - on Cozaar  Hyperlipidemia on statins.  Hypothyroidism on Synthroid   DVT prophylaxis:  Lovenox   Code Status: Full Code   Family Communication: patient  Disposition Plan:     Consultants:   cards     Subjective: Nervous about cath today No chest pain  Objective: Vitals:   11/21/16 0500 11/21/16 1500 11/21/16 2229 11/22/16 0517  BP: 106/63 (!) 142/80 138/65 133/69  Pulse: 83 93 100 85  Resp:      Temp: 97.9 F (36.6 C) 97.7 F (36.5 C) 98.2 F (36.8 C) 98.2 F (36.8 C)  TempSrc: Oral Oral Oral Oral  SpO2: 97% 98% 100% 96%  Weight: 88.5 kg (195 lb 3.2 oz)   87.5 kg (192 lb 14.4 oz)  Height:        Intake/Output Summary (Last 24 hours) at 11/22/16 1152 Last data filed at 11/22/16 0600  Gross per 24 hour  Intake          1399.65 ml  Output             2500 ml  Net         -1100.35 ml   Filed  Weights   11/20/16 2316 11/21/16 0500 11/22/16 0517  Weight: 89.1 kg (196 lb 8 oz) 88.5 kg (195 lb 3.2 oz) 87.5 kg (192 lb 14.4 oz)    Examination:  General exam: Appears calm and comfortable  Respiratory system: Clear to auscultation. Respiratory effort normal. Cardiovascular system: S1 & S2 heard, RRR. No JVD, murmurs, rubs, gallops or clicks. No pedal edema. Gastrointestinal system: Abdomen is nondistended, soft and nontender. No organomegaly or masses felt. Normal bowel sounds heard. Central nervous system: Alert and oriented. No focal neurological deficits.     Data Reviewed: I have personally reviewed following labs and imaging studies  CBC:  Recent Labs Lab 11/20/16 1849 11/21/16 0121  WBC 14.7* 13.2*  HGB 14.0 12.2  HCT 42.8 38.1  MCV 86.3 86.6  PLT 340 546   Basic Metabolic Panel:  Recent Labs Lab 11/20/16 1849 11/21/16 0121  NA 133* 134*  K 4.0 3.9  CL 95* 100*  CO2 24 27  GLUCOSE 378* 249*  BUN 17 18  CREATININE 1.03* 0.85  CALCIUM 9.8 9.0   GFR: Estimated Creatinine Clearance: 67.5 mL/min (by C-G formula based on SCr of 0.85 mg/dL). Liver Function Tests:  Recent Labs Lab 11/21/16 0121  AST 33  ALT 32  ALKPHOS 106  BILITOT 0.4  PROT 6.3*  ALBUMIN 3.3*    Recent Labs Lab 11/20/16 1849 11/21/16 0121  LIPASE 23 22   No results for input(s): AMMONIA in the last 168 hours. Coagulation Profile:  Recent Labs Lab 11/22/16 0047  INR 0.95   Cardiac Enzymes:  Recent Labs Lab 11/21/16 0121 11/21/16 0630 11/21/16 1140  TROPONINI <0.03 <0.03 <0.03   BNP (last 3 results) No results for input(s): PROBNP in the last 8760 hours. HbA1C:  Recent Labs  11/21/16 0900  HGBA1C 10.9*   CBG:  Recent Labs Lab 11/21/16 1135 11/21/16 1611 11/21/16 2215 11/22/16 0823 11/22/16 1122  GLUCAP 224* 259* 283* 300* 230*   Lipid Profile: No results for input(s): CHOL, HDL, LDLCALC, TRIG, CHOLHDL, LDLDIRECT in the last 72 hours. Thyroid  Function Tests: No results for input(s): TSH, T4TOTAL, FREET4, T3FREE, THYROIDAB in the last 72 hours. Anemia Panel: No results for input(s): VITAMINB12, FOLATE, FERRITIN, TIBC, IRON, RETICCTPCT in the last 72 hours. Urine analysis:    Component Value Date/Time   COLORURINE YELLOW 11/20/2016 2100   APPEARANCEUR HAZY (A) 11/20/2016 2100   LABSPEC 1.030 11/20/2016 2100   PHURINE 5.0 11/20/2016 2100   GLUCOSEU >=500 (A) 11/20/2016 2100   HGBUR NEGATIVE 11/20/2016 2100   HGBUR negative 03/24/2010 0948   BILIRUBINUR NEGATIVE 11/20/2016 2100   BILIRUBINUR 1+ 07/28/2016 [place] 11/20/2016 2100   PROTEINUR NEGATIVE 11/20/2016 2100   UROBILINOGEN 1.0 07/28/2016 1045   UROBILINOGEN 1.0 03/02/2015 1628   NITRITE NEGATIVE 11/20/2016 2100   LEUKOCYTESUR NEGATIVE 11/20/2016 2100     )No results found for this or any previous visit (from the past 240 hour(s)).    Anti-infectives    Start     Dose/Rate Route Frequency Ordered Stop   11/20/16 2115  fluconazole (DIFLUCAN) tablet 150 mg     150 mg Oral  Once 11/20/16 2102 11/20/16 0150       Radiology Studies: Dg Chest 2 View  Result Date: 11/20/2016 CLINICAL DATA:  61 year old female with central chest pain nausea and shortness of breath for 1 day. Initial encounter. EXAM: CHEST  2 VIEW COMPARISON:  01/26/2015 and earlier. FINDINGS: Lung volumes are stable and within normal limits. Normal cardiac size and mediastinal contours. Visualized tracheal air column is within normal limits. Lung parenchyma is stable and clear. No pneumothorax or pleural effusion. Negative visible bowel gas pattern. Stable cholecystectomy clips. No acute osseous abnormality identified. IMPRESSION: No acute cardiopulmonary abnormality. Electronically Signed   By: Genevie Ann M.D.   On: 11/20/2016 19:41        Scheduled Meds: . ARIPiprazole  2 mg Oral QHS  . [START ON 11/23/2016] aspirin EC  81 mg Oral Daily  . DULoxetine  60 mg Oral Daily  .  enoxaparin (LOVENOX) injection  40 mg Subcutaneous Q24H  . insulin aspart  0-9 Units Subcutaneous TID WC  . insulin glargine  30 Units Subcutaneous Daily  . levothyroxine  125 mcg Oral QAC breakfast  . losartan  50 mg Oral Daily  . pantoprazole  40 mg Oral Daily  . rosuvastatin  40 mg Oral q1800  . sodium chloride flush  3 mL Intravenous Q12H   Continuous Infusions: . sodium chloride 1 mL/kg/hr (11/22/16 0655)     LOS: 1 day    Time spent: 25 min    [place], DO [facility] Pager [phone]  If 7PM-7AM, please contact night-coverage [email or web address] Password [email or web address] 11/22/2016, 11:52 AM

## 2016-11-22 NOTE — Discharge Instructions (Signed)
Chest Wall Pain °Chest wall pain is pain in or around the bones and muscles of your chest. Sometimes, an injury causes this pain. Sometimes, the cause may not be known. This pain may take several weeks or longer to get better. °Follow these instructions at home: °Pay attention to any changes in your symptoms. Take these actions to help with your pain: °· Rest as told by your health care provider. °· Avoid activities that cause pain. These include any activities that use your chest muscles or your abdominal and side muscles to lift heavy items. °· If directed, apply ice to the painful area: °¨ Put ice in a plastic bag. °¨ Place a towel between your skin and the bag. °¨ Leave the ice on for 20 minutes, 2-3 times per day. °· Take over-the-counter and prescription medicines only as told by your health care provider. °· Do not use tobacco products, including cigarettes, chewing tobacco, and e-cigarettes. If you need help quitting, ask your health care provider. °· Keep all follow-up visits as told by your health care provider. This is important. °Contact a health care provider if: °· You have a fever. °· Your chest pain becomes worse. °· You have new symptoms. °Get help right away if: °· You have nausea or vomiting. °· You feel sweaty or light-headed. °· You have a cough with phlegm (sputum) or you cough up blood. °· You develop shortness of breath. °This information is not intended to replace advice given to you by your health care provider. Make sure you discuss any questions you have with your health care provider. °Document Released: 09/04/2005 Document Revised: 01/13/2016 Document Reviewed: 11/30/2014 °Elsevier Interactive Patient Education © 2017 [employer] ° °

## 2016-11-22 NOTE — Progress Notes (Signed)
Progress Note  Patient Name: Yesenia Wilson Date of Encounter: 11/22/2016  Primary Cardiologist: New Dr. Angelena Form  Subjective   No chest pain and no SOB  Inpatient Medications    Scheduled Meds: . ARIPiprazole  2 mg Oral QHS  . [START ON 11/23/2016] aspirin EC  81 mg Oral Daily  . DULoxetine  60 mg Oral Daily  . enoxaparin (LOVENOX) injection  40 mg Subcutaneous Q24H  . insulin aspart  0-9 Units Subcutaneous TID WC  . insulin glargine  30 Units Subcutaneous Daily  . levothyroxine  125 mcg Oral QAC breakfast  . living well with diabetes book   Does not apply Once  . losartan  50 mg Oral Daily  . pantoprazole  40 mg Oral Daily  . rosuvastatin  40 mg Oral q1800  . sodium chloride flush  3 mL Intravenous Q12H   Continuous Infusions: . sodium chloride 1 mL/kg/hr (11/22/16 0655)   PRN Meds: sodium chloride, acetaminophen, levalbuterol, nitroGLYCERIN, ondansetron (ZOFRAN) IV, sodium chloride flush, traMADol   Vital Signs    Vitals:   11/21/16 0500 11/21/16 1500 11/21/16 2229 11/22/16 0517  BP: 106/63 (!) 142/80 138/65 133/69  Pulse: 83 93 100 85  Resp:      Temp: 97.9 F (36.6 C) 97.7 F (36.5 C) 98.2 F (36.8 C) 98.2 F (36.8 C)  TempSrc: Oral Oral Oral Oral  SpO2: 97% 98% 100% 96%  Weight: 195 lb 3.2 oz (88.5 kg)   192 lb 14.4 oz (87.5 kg)  Height:        Intake/Output Summary (Last 24 hours) at 11/22/16 0939 Last data filed at 11/22/16 0600  Gross per 24 hour  Intake          1399.65 ml  Output             2500 ml  Net         -1100.35 ml   Filed Weights   11/20/16 2316 11/21/16 0500 11/22/16 0517  Weight: 196 lb 8 oz (89.1 kg) 195 lb 3.2 oz (88.5 kg) 192 lb 14.4 oz (87.5 kg)    Telemetry    SR to ST - Personally Reviewed  ECG    No new but ST inappropriate ST  - Personally Reviewed  Physical Exam   GEN: No acute distress.   Neck: No JVD Cardiac: RRR, no murmurs, rubs, or gallops.  Respiratory: Clear except for end expiratory wheeze to  auscultation bilaterally. GI: Soft, nontender, non-distended  MS: No edema; No deformity. Neuro:  Nonfocal  Psych: Normal affect   Labs    Chemistry Recent Labs Lab 11/20/16 1849 11/21/16 0121  NA 133* 134*  K 4.0 3.9  CL 95* 100*  CO2 24 27  GLUCOSE 378* 249*  BUN 17 18  CREATININE 1.03* 0.85  CALCIUM 9.8 9.0  PROT  --  6.3*  ALBUMIN  --  3.3*  AST  --  33  ALT  --  32  ALKPHOS  --  106  BILITOT  --  0.4  GFRNONAA 57* >60  GFRAA >60 >60  ANIONGAP 14 7     Hematology Recent Labs Lab 11/20/16 1849 11/21/16 0121  WBC 14.7* 13.2*  RBC 4.96 4.40  HGB 14.0 12.2  HCT 42.8 38.1  MCV 86.3 86.6  MCH 28.2 27.7  MCHC 32.7 32.0  RDW 14.0 13.9  PLT 340 268    Cardiac Enzymes Recent Labs Lab 11/21/16 0121 11/21/16 0630 11/21/16 1140  TROPONINI <0.03 <0.03 <0.03  Recent Labs Lab 11/20/16 1907  TROPIPOC 0.00     BNPNo results for input(s): BNP, PROBNP in the last 168 hours.   DDimer  Recent Labs Lab 11/20/16 2140  DDIMER 0.29     Radiology    Dg Chest 2 View  Result Date: 11/20/2016 CLINICAL DATA:  61 year old female with central chest pain nausea and shortness of breath for 1 day. Initial encounter. EXAM: CHEST  2 VIEW COMPARISON:  01/26/2015 and earlier. FINDINGS: Lung volumes are stable and within normal limits. Normal cardiac size and mediastinal contours. Visualized tracheal air column is within normal limits. Lung parenchyma is stable and clear. No pneumothorax or pleural effusion. Negative visible bowel gas pattern. Stable cholecystectomy clips. No acute osseous abnormality identified. IMPRESSION: No acute cardiopulmonary abnormality. Electronically Signed   By: Genevie Ann M.D.   On: 11/20/2016 19:41    Cardiac Studies   Cath today  Echo P  Patient Profile     61 y.o. female with a history of HTN, HLD, Uncontrolled diabetes, sleep apnea previously  on CPAP (dicontinued after nose surgery), hypothyroidism, family hx of CAD and pancreatitis  presents for chest pain.     Assessment & Plan    1. Chest pain  - Seems atypical. Stress test normal in 2011. Troponin negative x 3. EKG without acute changes. She has a chronic right bundle branch block. - Her cardiac risk factor includes hypertension, hyperlipidemia, uncontrolled diabetes and strong family history of early CAD. Pending echocardiogram. Given significant risk factor, she will benefits from cardiac catheterization.   2. Essential hypertension - Stable on current medications with BP 138-133/65-69     3. Sleep apnea - Previously on CPAP which was discontinued after Nose surgery. Admits having snoring, daytime sleepiness and tiredness. She will benefit from outpatient sleep study evaluation.  4. HLD - 08/03/2016: Cholesterol 266; HDL 48.10; Triglycerides 221.0; VLDL 44.2   LDL 208. - Continue Crestor  5. Type 2 diabetes mellitus with hyperglycemia (HCC) - A1c was 17.3 on 06/2016. HGB A1C is 10.9 this admit. Needs tight control.  - here glucose running 220-300  6. Obesity -Estimated body mass index is 38.12 kg/m as calculated from the following:   Height as of this encounter: 5' (1.524 m).   Weight as of this encounter: 195 lb 3.2 oz (88.5 kg).  - Advised regular exercise and weight loss on admit.   7.  Tachycardia had been on BB until 6 months ago stopped with pancreatitis - she does become SOB with Tachycardia   Signed, Cecilie Kicks, NP  11/22/2016, 9:39 AM    Patient seen, examined. Available data reviewed. Agree with findings, assessment, and plan as outlined by Cecilie Kicks, NP. The patient is independently interviewed and examined. Heart is regular rate and rhythm. Lungs are clear bilaterally. Right radial pulses 2+. There is no peripheral edema. All data is reviewed. The patient is at high risk for acute coronary syndrome with poorly controlled hypercholesterolemia, diabetes, obesity, sleep apnea, and strong family history of coronary artery disease. I  agree cardiac catheterization and possible angioplasty/stent in her indicated. I have reviewed the risks, indications, and alternatives to cardiac catheterization, possible angioplasty, and stenting with the patient. Risks include but are not limited to bleeding, infection, vascular injury, stroke, myocardial infection, arrhythmia, kidney injury, radiation-related injury in the case of prolonged fluoroscopy use, emergency cardiac surgery, and death. The patient understands the risks of serious complication is 1-2 in 6767 with diagnostic cardiac cath and 1-2% or less with angioplasty/stenting.  All questions are answered. The patient is on the schedule this afternoon. Current medicines are reviewed and will be continued.  Sherren Mocha, M.D. 11/22/2016 11:44 AM

## 2016-11-23 ENCOUNTER — Encounter (HOSPITAL_COMMUNITY): Payer: Self-pay | Admitting: Cardiovascular Disease

## 2016-11-23 NOTE — Consult Note (Signed)
           [facility] CM Primary Care Navigator  11/23/2016  Deseray Daponte 1956/01/10 [id number]   Attempt to see patient today at the bedside to identify possible discharge needs but she was already discharged home yesterday evening per staff.  Primary care provider's office called [facility]ley) to notify of patient's discharge and need for post hospital follow-up and transition of care. Notified of need for continued tight control of patient's poorly controlled DM. Made aware to refer patient to [facility] care management if deemed appropriate for services.   For questions, please contact:  Dannielle Huh, BSN, RN- [facility] Primary Care Navigator  Telephone: [phone] [place]

## 2016-11-24 ENCOUNTER — Encounter: Payer: Self-pay | Admitting: Endocrinology

## 2016-11-24 ENCOUNTER — Telehealth: Payer: Self-pay

## 2016-11-24 ENCOUNTER — Ambulatory Visit (INDEPENDENT_AMBULATORY_CARE_PROVIDER_SITE_OTHER): Payer: Medicare Other | Admitting: Endocrinology

## 2016-11-24 VITALS — BP 150/90 | HR 102 | Ht 62.5 in | Wt 195.0 lb

## 2016-11-24 DIAGNOSIS — I10 Essential (primary) hypertension: Secondary | ICD-10-CM | POA: Diagnosis not present

## 2016-11-24 DIAGNOSIS — E1165 Type 2 diabetes mellitus with hyperglycemia: Secondary | ICD-10-CM

## 2016-11-24 DIAGNOSIS — F321 Major depressive disorder, single episode, moderate: Secondary | ICD-10-CM

## 2016-11-24 DIAGNOSIS — Z794 Long term (current) use of insulin: Secondary | ICD-10-CM | POA: Diagnosis not present

## 2016-11-24 MED ORDER — VICTOZA 18 MG/3ML ~~LOC~~ SOPN: 1.2000 mg | PEN_INJECTOR | Freq: Every day | SUBCUTANEOUS | 3 refills | Status: DC

## 2016-11-24 NOTE — Telephone Encounter (Signed)
Lorraine @ [facility] called to report that pt is eligible for follow up care with them. Pt needs close DM management.

## 2016-11-24 NOTE — Telephone Encounter (Signed)
D/C 11/22/16 To: home  Spoke with pt and she states that she is doing well. She is scheduled to see Dr. Dwyane Dee about her DM management.   Appt scheduled with Dr. Elease Hashimoto 12/01/16, pt aware.     Transition Care Management Follow-up Telephone Call  How have you been since you were released from the hospital? good   Do you understand why you were in the hospital? yes   Do you understand the discharge instrcutions? yes  Items Reviewed:  Medications reviewed: yes  Allergies reviewed: yes  Dietary changes reviewed: yes  Referrals reviewed: yes   Functional Questionnaire:   Activities of Daily Living (ADLs):   She states they are independent in the following: ambulation, bathing and hygiene, feeding, continence, grooming, toileting and dressing States they require assistance with the following: none   Any transportation issues/concerns?: no   Any patient concerns? no   Confirmed importance and date/time of follow-up visits scheduled: yes   Confirmed with patient if condition begins to worsen call PCP or go to the ER.  Patient was given the Call-a-Nurse line [phone]: yes

## 2016-11-24 NOTE — Patient Instructions (Addendum)
Check blood sugars on waking up  3x per week  Also check blood sugars about 2 hours after a meal and do this after different meals by rotation  Recommended blood sugar levels on waking up is 90-130 and about 2 hours after meal is 130-160  Please bring your blood sugar monitor to each visit, thank you  LANTUS 50 DAILY  HUMALOG 15 WITH MEALS  Start VICTOZA injection as shown once daily at the same time of the day.  Dial the dose to 0.6 mg on the pen for the first week.  You may inject in the stomach, thigh or arm. You may experience nausea in the first few days which usually goes away.   You will feel fullness of the stomach with starting the medication and should try to keep the portions at meals small.  After 1 week increase the dose to 1.2mg  daily if no nausea present.   If any questions or concerns are present call the office or the [place] helpline at [phone]. Visit [email or web address] for more useful information

## 2016-11-24 NOTE — Telephone Encounter (Signed)
LMTCB

## 2016-11-24 NOTE — Progress Notes (Signed)
Patient ID: Yesenia Wilson, female   DOB: 1955-12-25, 61 y.o.   MRN: [id number]            Reason for Appointment: Follow-up for Type 2 Diabetes   History of Present Illness:          Date of diagnosis of type 2 diabetes mellitus: 2002       Background history:   She had been on metformin for several years with fair control Her blood sugars started increasing in 2016 with A1c over 7% Blood sugars were markedly increased with glucose of 440 in 2/17 and she may not have been regular with her metformin at that time.  She was given Amaryl also but subsequently blood sugars were continuing to be high She was tried on Trulicity in April 4098 but apparently this did not help her blood sugars  Recent history:   INSULIN regimen is: Lantus 37 units in the a.m., Humalog Insulin variable doses with meals      Non-insulin hypoglycemic drugs the patient is taking are: Metformin 1 g twice a day  A1c was over 17% during her hospitalization in October, now still high at 10.9  Current management, blood sugar patterns and problems identified:  She has much higher blood sugars since her last visit were previously averaging below 200 and now almost 300  She said that she recently she has had a lot of stress and personal issues  She had difficulty controlling her portion and feels hungry all the time.  Currently taking Abilify  She may not always remember to take her insulin particularly the mealtime insulin  Her Lantus insulin was increased somewhat in the hospital, previously taking 30 units; also advised to try this twice a day on her last visit but is back on once a day  Has not done any readings after supper   Side effects from medications have been: None  Compliance with the medical regimen: Inadequate  Hypoglycemia: None  Glucose monitoring:  done 1-3 times a day         Glucometer: One Touch.      Blood Glucose readings by time of day and averages from meter download:  Mean values  apply above for all meters except median for One Touch  PRE-MEAL Fasting Lunch Dinner Bedtime Overall  Glucose range: 228-368  168-427  2 45-415  2 45-394    Mean/median: 280  364  258   283     Self-care: The diet that the patient has been following is: tries to limit Carbs.     Typical meal intake: Breakfast is Boiled eggs, tomato.  Lunch is usually [place] sandwich with fruit, sometimes salad.  Evening meal is salad or soup               Dietician visit, most recent: Pending               Exercise:  walking 20 min 3/7 Days a week  Weight history:  Wt Readings from Last 3 Encounters:  11/24/16 195 lb (88.5 kg)  11/22/16 192 lb 14.4 oz (87.5 kg)  10/17/16 190 lb 1.6 oz (86.2 kg)    Glycemic control:     Lab Results  Component Value Date   HGBA1C 10.9 (H) 11/21/2016   HGBA1C 17.3 06/26/2016   HGBA1C 10.5 (H) 02/15/2016   Lab Results  Component Value Date   MICROALBUR 3.0 (H) 08/03/2016   LDLCALC 109 (H) 10/07/2014   CREATININE 0.85 11/21/2016   Lab Results  Component Value Date   MICRALBCREAT 2.6 08/03/2016    Other active problems: See review of systems   Allergies as of 11/24/2016      Reactions   Oxycodone-acetaminophen    REACTION: GI upset   Propoxyphene N-acetaminophen    REACTION: nausea, rapid heartrate      Medication List       Accurate as of 11/24/16 11:59 PM. Always use your most recent med list.          ARIPiprazole 2 MG tablet Commonly known as:  ABILIFY TAKE 1 TABLET (2 MG TOTAL) BY MOUTH AT BEDTIME.   aspirin 81 MG tablet Take 81 mg by mouth daily.   DULoxetine 60 MG capsule Commonly known as:  CYMBALTA TAKE 1 CAPSULE (60 MG TOTAL) BY MOUTH DAILY.   esomeprazole 40 MG capsule Commonly known as:  NEXIUM TAKE ONE CAPSULE BY MOUTH EVERY DAY BEFORE BREAKFAST   estradiol 0.5 MG tablet Commonly known as:  ESTRACE TAKE 1 TABLET EVERY DAY   glucose blood test strip Commonly known as:  ONETOUCH VERIO Use to test blood sugar 4 times a  day   HUMALOG KWIKPEN 100 UNIT/ML KiwkPen Generic drug:  insulin lispro Inject 0-12 Units into the skin 3 (three) times daily.   insulin glargine 100 UNIT/ML injection Commonly known as:  LANTUS USE 37 UNITS DAILY   Insulin Pen Needle 32G X 4 MM Misc Commonly known as:  BD PEN NEEDLE NANO U/F Use 3 per to day to inject novolog   losartan 50 MG tablet Commonly known as:  COZAAR Take 1 tablet (50 mg total) by mouth daily.   metFORMIN 1000 MG tablet Commonly known as:  GLUCOPHAGE TAKE 1 TABLET (1,000 MG TOTAL) BY MOUTH 2 (TWO) TIMES DAILY.   ONETOUCH DELICA LANCETS FINE Misc Use to check blood sugar 4 times per day. Dx Code E11.9   rosuvastatin 20 MG tablet Commonly known as:  CRESTOR TAKE 1 TABLET (20 MG TOTAL) BY MOUTH DAILY.   SYNTHROID 125 MCG tablet Generic drug:  levothyroxine TAKE 1 TABLET (125 MCG TOTAL) BY MOUTH DAILY BEFORE BREAKFAST.   traMADol 50 MG tablet Commonly known as:  ULTRAM Take one to two tablets every 6 hours prn pain   VICTOZA 18 MG/3ML Sopn Generic drug:  liraglutide Inject 0.2 mLs (1.2 mg total) into the skin daily. Inject once daily at the same time   [facility] HFA 45 MCG/ACT inhaler Generic drug:  levalbuterol Inhale 1-2 puffs into the lungs 4 (four) times daily as needed for shortness of breath.       Allergies:  Allergies  Allergen Reactions  . Oxycodone-Acetaminophen     REACTION: GI upset  . Propoxyphene N-Acetaminophen     REACTION: nausea, rapid heartrate    Past Medical History:  Diagnosis Date  . Asthma   . Depression   . Diabetes mellitus   . Fibromyalgia   . Hyperlipidemia   . Hypertension   . Lupus   . Pancreatitis   . Sleep apnea   . Thyroid disease     Past Surgical History:  Procedure Laterality Date  . APPENDECTOMY    . bladder tack    . CESAREAN SECTION    . CHOLECYSTECTOMY    . LEFT HEART CATH AND CORONARY ANGIOGRAPHY N/A 11/22/2016   Procedure: Left Heart Cath and Coronary Angiography;  Surgeon:  Burnell Blanks, MD;  Location: [facility];  Service: Cardiovascular;  Laterality: N/A;  . SEPTOPLASTY  Family History  Problem Relation Age of Onset  . Adopted: Yes  . Diabetes Mellitus II Mother   . CAD Father   . Diabetes Mellitus II Father   . CAD Sister   . Diabetes Mellitus II Sister   . CAD Brother   . Diabetes Mellitus II Brother     Social History:  reports that she has never smoked. She has never used smokeless tobacco. She reports that she drinks alcohol. She reports that she does not use drugs.   Review of Systems  Cardiovascular: Negative for leg swelling.    She appears anxious and when asked about depression she is on the verge of crying  Lipid history: She has been on Crestor for hyperlipidemia. Triglycerides appear better than before    Lab Results  Component Value Date   CHOL 266 (H) 08/03/2016   HDL 48.10 08/03/2016   LDLCALC 109 (H) 10/07/2014   LDLDIRECT 208.0 08/03/2016   TRIG 221.0 (H) 08/03/2016   CHOLHDL 6 08/03/2016           Hypertension:  She is currently on low-dose 50 mg losartan only  Most recent eye exam was 5/17   Most recent foot exam:10/17  HYPOTHYROIDISM: She thinks she has been hypothyroid since age of 39 and at that time she was having fatigue and lethargy. Her dosage was increased in 5/17  TSH is back to normal in October  Lab Results  Component Value Date   TSH 2.90 07/05/2016   TSH 7.42 (H) 02/15/2016   TSH 0.69 01/22/2015   FREET4 1.33 10/01/2009     LABS:    Physical Examination:  BP (!) 150/90   Pulse (!) 102   Ht 5' 2.5" (1.588 m)   Wt 195 lb (88.5 kg)   SpO2 96%   BMI 35.10 kg/m   She is anxious, has some pressure with speech and tangential thinking   ASSESSMENT:  Diabetes type 2, uncontrolled   See history of present illness for detailed discussion of current diabetes management, blood sugar patterns and problems identified  She appears to be requiring significant  amount of insulin both basal and mealtime insulin Part of her hyperglycemia is related to inadequate control of her diet and large portions, she is having difficulty controlling hunger with meals and in between Also is appearing to have significant problems with anxiety and depression making it difficult to control her diabetes. Because of that she needs to be on a medication that will help control her appetite Blood sugars have been as high as 427   HYPERTENSION: Previously on Avalide and metoprolol, currently blood pressure still high with losartan She is also anxious today His followed by PCP for this  DEPRESSION: She needs to discuss these with her PCP for helping her manage her stress as well as reduce stress eating  HYPERLIPIDEMIA: Has a tendency to persistently high triglycerides, will need reevaluation of  lipids when blood sugars are better controlled  Hypothyroidism: Will need follow-up TSH on her next labs  PLAN for diabetes:    Start taking Lantus 50 units daily for now, consider switching to Toujeo  She will increase her Humalog to 15 units before each meal Discussed with the patient the nature of GLP-1 drugs, the actions on various organ systems, how they benefit blood glucose control, as well as the benefit of weight loss and  increase satiety . Explained possible side effects especially nausea and vomiting initially; discussed safety information in package insert.  Described the  injection technique and dosage titration of Victoza  starting with 0.6 mg once a day at the same time for the first week and then increasing to 1.2 mg if no symptoms of nausea.  Educational brochure on Victoza and co-pay card given  Needs short-term follow-up  Also will have her review her progress with nurse educator in about 10 days  She will discuss blood pressure medications with PCP   Patient Instructions  Check blood sugars on waking up  3x per week  Also check blood sugars about 2  hours after a meal and do this after different meals by rotation  Recommended blood sugar levels on waking up is 90-130 and about 2 hours after meal is 130-160  Please bring your blood sugar monitor to each visit, thank you  LANTUS 50 DAILY  HUMALOG 15 WITH MEALS  Start VICTOZA injection as shown once daily at the same time of the day.  Dial the dose to 0.6 mg on the pen for the first week.  You may inject in the stomach, thigh or arm. You may experience nausea in the first few days which usually goes away.   You will feel fullness of the stomach with starting the medication and should try to keep the portions at meals small.  After 1 week increase the dose to 1.2mg  daily if no nausea present.   If any questions or concerns are present call the office or the [place] helpline at [phone]. Visit [email or web address].info/ for more useful information      Counseling time on subjects discussed above is over 50% of today's 25 minute visit    Kailey Esquilin 11/26/2016, 8:52 PM   Note: This office note was prepared with Dragon voice recognition system technology. Any transcriptional errors that result from this process are unintentional.

## 2016-11-30 ENCOUNTER — Telehealth: Payer: Self-pay | Admitting: Endocrinology

## 2016-11-30 ENCOUNTER — Telehealth: Payer: Self-pay | Admitting: Family Medicine

## 2016-11-30 ENCOUNTER — Encounter (HOSPITAL_COMMUNITY): Payer: Self-pay

## 2016-11-30 DIAGNOSIS — Z7982 Long term (current) use of aspirin: Secondary | ICD-10-CM | POA: Diagnosis not present

## 2016-11-30 DIAGNOSIS — K2900 Acute gastritis without bleeding: Secondary | ICD-10-CM | POA: Insufficient documentation

## 2016-11-30 DIAGNOSIS — Z794 Long term (current) use of insulin: Secondary | ICD-10-CM | POA: Insufficient documentation

## 2016-11-30 DIAGNOSIS — Z79899 Other long term (current) drug therapy: Secondary | ICD-10-CM | POA: Diagnosis not present

## 2016-11-30 DIAGNOSIS — R1013 Epigastric pain: Secondary | ICD-10-CM | POA: Diagnosis present

## 2016-11-30 DIAGNOSIS — E119 Type 2 diabetes mellitus without complications: Secondary | ICD-10-CM | POA: Insufficient documentation

## 2016-11-30 DIAGNOSIS — J45909 Unspecified asthma, uncomplicated: Secondary | ICD-10-CM | POA: Diagnosis not present

## 2016-11-30 DIAGNOSIS — I10 Essential (primary) hypertension: Secondary | ICD-10-CM | POA: Diagnosis not present

## 2016-11-30 LAB — URINALYSIS, ROUTINE W REFLEX MICROSCOPIC
Glucose, UA: NEGATIVE mg/dL
HGB URINE DIPSTICK: NEGATIVE
KETONES UR: 5 mg/dL — AB
LEUKOCYTES UA: NEGATIVE
NITRITE: NEGATIVE
Protein, ur: 100 mg/dL — AB
SPECIFIC GRAVITY, URINE: 1.029 (ref 1.005–1.030)
pH: 5 (ref 5.0–8.0)

## 2016-11-30 LAB — CBC
HEMATOCRIT: 43.9 % (ref 36.0–46.0)
Hemoglobin: 14.5 g/dL (ref 12.0–15.0)
MCH: 28.4 pg (ref 26.0–34.0)
MCHC: 33 g/dL (ref 30.0–36.0)
MCV: 86.1 fL (ref 78.0–100.0)
Platelets: 386 10*3/uL (ref 150–400)
RBC: 5.1 MIL/uL (ref 3.87–5.11)
RDW: 13.9 % (ref 11.5–15.5)
WBC: 15.8 10*3/uL — AB (ref 4.0–10.5)

## 2016-11-30 LAB — I-STAT TROPONIN, ED: TROPONIN I, POC: 0.01 ng/mL (ref 0.00–0.08)

## 2016-11-30 MED ORDER — ONDANSETRON 4 MG PO TBDP
ORAL_TABLET | ORAL | Status: AC
  Filled 2016-11-30: qty 1

## 2016-11-30 MED ORDER — ONDANSETRON 4 MG PO TBDP
4.0000 mg | ORAL_TABLET | Freq: Once | ORAL | Status: AC
  Administered 2016-11-30: 4 mg via ORAL

## 2016-11-30 NOTE — ED Triage Notes (Signed)
PT reports epigastric pain, N/V, bloating, and excessive belching x 4 days. PT reports hx of pancreatitis. Pt reports pain feels like a "pressure" and she reports that the belching produces a "very foul odor". ABD distended in triage.

## 2016-11-30 NOTE — Telephone Encounter (Signed)
Pt called back and said she figured out what was wrong (see phone note from earlier), she took too much of her medication and it caused the stomach pain and nausea.

## 2016-11-30 NOTE — Telephone Encounter (Signed)
Patient was put on Victoza and now she has nausea, and stomach pain.  She wants to know if it's side effects from the new medication.  She has not eaten in 3 days.  Please call.

## 2016-12-01 ENCOUNTER — Other Ambulatory Visit: Payer: Self-pay | Admitting: Family Medicine

## 2016-12-01 ENCOUNTER — Encounter (HOSPITAL_COMMUNITY): Payer: Self-pay | Admitting: *Deleted

## 2016-12-01 ENCOUNTER — Emergency Department (HOSPITAL_COMMUNITY): Payer: Medicare Other

## 2016-12-01 ENCOUNTER — Ambulatory Visit: Payer: Medicare Other | Admitting: Family Medicine

## 2016-12-01 ENCOUNTER — Emergency Department (HOSPITAL_COMMUNITY)
Admission: EM | Admit: 2016-12-01 | Discharge: 2016-12-01 | Disposition: A | Discharge status: Home / Self Care | Payer: Medicare Other | Attending: Emergency Medicine | Admitting: Emergency Medicine

## 2016-12-01 DIAGNOSIS — R1013 Epigastric pain: Secondary | ICD-10-CM | POA: Diagnosis not present

## 2016-12-01 DIAGNOSIS — K2900 Acute gastritis without bleeding: Secondary | ICD-10-CM

## 2016-12-01 LAB — COMPREHENSIVE METABOLIC PANEL
ALK PHOS: 107 U/L (ref 38–126)
ALT: 34 U/L (ref 14–54)
AST: 46 U/L — AB (ref 15–41)
Albumin: 4.2 g/dL (ref 3.5–5.0)
Anion gap: 11 (ref 5–15)
BUN: 11 mg/dL (ref 6–20)
CALCIUM: 10.1 mg/dL (ref 8.9–10.3)
CHLORIDE: 98 mmol/L — AB (ref 101–111)
CO2: 27 mmol/L (ref 22–32)
CREATININE: 0.87 mg/dL (ref 0.44–1.00)
GFR calc Af Amer: >60 mL/min (ref >60)
Glucose, Bld: 167 mg/dL — ABNORMAL HIGH (ref 65–99)
Potassium: 3.8 mmol/L (ref 3.5–5.1)
SODIUM: 136 mmol/L (ref 135–145)
Total Bilirubin: 1 mg/dL (ref 0.3–1.2)
Total Protein: 7.8 g/dL (ref 6.5–8.1)

## 2016-12-01 LAB — LIPASE, BLOOD: LIPASE: 10 U/L — AB (ref 11–51)

## 2016-12-01 MED ORDER — ONDANSETRON 4 MG PO TBDP: 4.0000 mg | ORAL_TABLET | Freq: Three times a day (TID) | ORAL | 0 refills | Status: DC | PRN

## 2016-12-01 MED ORDER — MORPHINE SULFATE (PF) 4 MG/ML IV SOLN
4.0000 mg | Freq: Once | INTRAVENOUS | Status: AC
  Administered 2016-12-01: 4 mg via INTRAVENOUS
  Filled 2016-12-01: qty 1

## 2016-12-01 MED ORDER — GI COCKTAIL ~~LOC~~
30.0000 mL | Freq: Once | ORAL | Status: AC
  Administered 2016-12-01: 30 mL via ORAL
  Filled 2016-12-01: qty 30

## 2016-12-01 MED ORDER — SODIUM CHLORIDE 0.9 % IV BOLUS (SEPSIS)
1000.0000 mL | Freq: Once | INTRAVENOUS | Status: AC
  Administered 2016-12-01: 1000 mL via INTRAVENOUS

## 2016-12-01 MED ORDER — ONDANSETRON HCL 4 MG/2ML IJ SOLN
4.0000 mg | Freq: Once | INTRAMUSCULAR | Status: AC
  Administered 2016-12-01: 4 mg via INTRAVENOUS
  Filled 2016-12-01: qty 2

## 2016-12-01 MED ORDER — SUCRALFATE 1 GM/10ML PO SUSP
1.0000 g | Freq: Three times a day (TID) | ORAL | 0 refills | Status: DC
Start: 2016-12-01 — End: 2017-01-16

## 2016-12-01 MED ORDER — IOPAMIDOL (ISOVUE-300) INJECTION 61%
INTRAVENOUS | Status: AC
  Administered 2016-12-01: 100 mL
  Filled 2016-12-01: qty 100

## 2016-12-01 MED ORDER — PANTOPRAZOLE SODIUM 40 MG PO TBEC: 40.0000 mg | DELAYED_RELEASE_TABLET | Freq: Every day | ORAL | 1 refills | Status: DC

## 2016-12-01 MED ORDER — PANTOPRAZOLE SODIUM 40 MG IV SOLR
40.0000 mg | Freq: Once | INTRAVENOUS | Status: AC
  Administered 2016-12-01: 40 mg via INTRAVENOUS
  Filled 2016-12-01: qty 40

## 2016-12-01 NOTE — Discharge Instructions (Signed)
Please avoid NSAIDs such as aspirin (Goody powders), ibuprofen (Motrin, Advil), naproxen (Aleve) as these may worsen your symptoms.  Tylenol 1000 mg every 6 hours is safe to take as long as you have no history of liver problems (heavy alcohol use, cirrhosis, hepatitis).  Please avoid spicy, acidic (citrus fruits, tomato based sauces, salsa), greasy, fatty foods.  Please avoid caffeine and alcohol.  I recommend follow-up with gastroenterology for further evaluation.  Your labs show the joint blood cell count was mildly elevated which appears chronic for you. Otherwise your labs today were unremarkable as was your CT scan.

## 2016-12-01 NOTE — ED Provider Notes (Signed)
TIME SEEN: 3:45 AM By signing my name below, I, Yesenia Wilson, attest that this documentation has been prepared under the direction and in the presence of [employer], DO.  Electronically Signed: Julien Nordmann, ED Scribe. 12/01/16. 3:55 AM.  CHIEF COMPLAINT:  Chief Complaint  Patient presents with  . Emesis  . Abdominal Pain    HPI:  HPI Comments: Yesenia Wilson is a 61 y.o. female who DMII, HLD, HTN, pancreatitis, lupus, fibromyalgia and thyroid disease presents to the Emergency Department complaining of persistent, severe, epigastric abdominal pain that radiates into her lower abdomen x 3 days. She reports associated nausea, belching, and loss of appetite. Pt expresses that she is not able to eat anything without causing severe abdominal pain. Pt says that there are no aggravating or modifying factors to her abdominal pain. She states she was diagnosed with pancreatitis ~ 6 months ago that led to her admission. She has an abdominal surgical hx of two cesarean sections, cholecystectomy, hysterectomy. Pt does not take NSAIDs frequently. No regular alcohol use. No history of gallstones. She also has not had an endoscopy performed. She denies diarrhea, melena, blood in stool, hematuria, dysuria, vaginal bleeding, vaginal discharge, chest pain, shortness of breath. Pt has no known drug allergies.    ROS: See HPI Constitutional: no fever  Eyes: no drainage  ENT: no runny nose   Cardiovascular:  no chest pain  Resp: no SOB  GI: no vomiting GU: no dysuria Integumentary: no rash  Allergy: no hives  Musculoskeletal: no leg swelling  Neurological: no slurred speech ROS otherwise negative  PAST MEDICAL HISTORY/PAST SURGICAL HISTORY:  Past Medical History:  Diagnosis Date  . Asthma   . Depression   . Diabetes mellitus   . Fibromyalgia   . Hyperlipidemia   . Hypertension   . Lupus   . Pancreatitis   . Sleep apnea   . Thyroid disease     MEDICATIONS:  Prior to Admission  medications   Medication Sig Start Date End Date Taking? Authorizing Provider  ARIPiprazole (ABILIFY) 2 MG tablet TAKE 1 TABLET (2 MG TOTAL) BY MOUTH AT BEDTIME. 11/17/15   Eulas Post, MD  aspirin 81 MG tablet Take 81 mg by mouth daily.      Historical Provider, MD  DULoxetine (CYMBALTA) 60 MG capsule TAKE 1 CAPSULE (60 MG TOTAL) BY MOUTH DAILY. 06/12/16   Eulas Post, MD  esomeprazole (NEXIUM) 40 MG capsule TAKE ONE CAPSULE BY MOUTH EVERY DAY BEFORE BREAKFAST 09/22/16   Eulas Post, MD  estradiol (ESTRACE) 0.5 MG tablet TAKE 1 TABLET EVERY DAY 11/21/16   Eulas Post, MD  glucose blood (ONETOUCH VERIO) test strip Use to test blood sugar 4 times a day 08/02/16   Elayne Snare, MD  insulin glargine (LANTUS) 100 UNIT/ML injection USE 37 UNITS DAILY 11/22/16   Geradine Girt, DO  insulin lispro (HUMALOG KWIKPEN) 100 UNIT/ML KiwkPen Inject 0-12 Units into the skin 3 (three) times daily.    Historical Provider, MD  Insulin Pen Needle (BD PEN NEEDLE NANO U/F) 32G X 4 MM MISC Use 3 per to day to inject novolog 07/06/16   Elayne Snare, MD  levalbuterol [facility] HFA) 45 MCG/ACT inhaler Inhale 1-2 puffs into the lungs 4 (four) times daily as needed for shortness of breath.     Historical Provider, MD  losartan (COZAAR) 50 MG tablet Take 1 tablet (50 mg total) by mouth daily. 08/04/16   Eulas Post, MD  metFORMIN (GLUCOPHAGE) 1000  MG tablet TAKE 1 TABLET (1,000 MG TOTAL) BY MOUTH 2 (TWO) TIMES DAILY. 01/19/16   Eulas Post, MD  [facility] DELICA LANCETS FINE MISC Use to check blood sugar 4 times per day. Dx Code E11.9 08/15/16   Elayne Snare, MD  rosuvastatin (CRESTOR) 20 MG tablet TAKE 1 TABLET (20 MG TOTAL) BY MOUTH DAILY. 08/14/16   Elayne Snare, MD  SYNTHROID 125 MCG tablet TAKE 1 TABLET (125 MCG TOTAL) BY MOUTH DAILY BEFORE BREAKFAST. 11/21/16   Eulas Post, MD  traMADol Veat[facility]) 50 MG tablet Take one to two tablets every 6 hours prn pain 04/30/15   Eulas Post, MD  VICTOZA 18  MG/3ML SOPN Inject 0.2 mLs (1.2 mg total) into the skin daily. Inject once daily at the same time 11/24/16   Elayne Snare, MD    ALLERGIES:  Allergies  Allergen Reactions  . Oxycodone-Acetaminophen     REACTION: GI upset  . Propoxyphene N-Acetaminophen     REACTION: nausea, rapid heartrate    SOCIAL HISTORY:  Social History  Substance Use Topics  . Smoking status: Never Smoker  . Smokeless tobacco: Never Used  . Alcohol use No    FAMILY HISTORY: Family History  Problem Relation Age of Onset  . Adopted: Yes  . Diabetes Mellitus II Mother   . CAD Father   . Diabetes Mellitus II Father   . CAD Sister   . Diabetes Mellitus II Sister   . CAD Brother   . Diabetes Mellitus II Brother     EXAM: BP (!) 145/86 (BP Location: Right Arm)   Pulse (!) 101   Temp 98.3 F (36.8 C) (Oral)   Resp 18   Ht 5' 2.5" (1.588 m)   Wt 193 lb (87.5 kg)   SpO2 95%   BMI 34.74 kg/m  CONSTITUTIONAL: Alert and oriented and responds appropriately to questions. Well-appearing; well-nourished, Afebrile and nontoxic appearing HEAD: Normocephalic EYES: Conjunctivae clear, pupils appear equal, EOMI ENT: normal nose; moist mucous membranes NECK: Supple, no meningismus, no nuchal rigidity, no LAD  CARD: regular and minimally tachycardic; S1 and S2 appreciated; no murmurs, no clicks, no rubs, no gallops RESP: Normal chest excursion without splinting or tachypnea; breath sounds clear and equal bilaterally; no wheezes, no rhonchi, no rales, no hypoxia or respiratory distress, speaking full sentences ABD/GI: Normal bowel sounds; non-distended; soft, tenderness to the epigastric region when palpated, no rebound, no guarding, no peritoneal signs, no hepatosplenomegaly BACK:  The back appears normal and is non-tender to palpation, there is no CVA tenderness EXT: Normal ROM in all joints; non-tender to palpation; no edema; normal capillary refill; no cyanosis, no calf tenderness or swelling    SKIN: Normal color  for age and race; warm; no rash NEURO: Moves all extremities equally PSYCH: The patient's mood and manner are appropriate. Grooming and personal hygiene are appropriate.  MEDICAL DECISION MAKING: Patient here with complaints of upper abdominal pain that radiates into her lower abdomen. Hemodynamically stable and afebrile. Differential diagnosis includes gastritis, GERD, gastroparesis, pancreatitis, bowel obstruction, colitis. We'll obtain CT imaging for further evaluation. Labs are unremarkable other than very mild elevation of her AST. Urine appears to be a dirty catch and she is not currently having urinary symptoms. Doubt ACS. Troponin is negative and EKG shows no ischemic abnormality. We'll treat symptoms with morphine, Zofran, GI cocktail and Protonix.  ED PROGRESS: Patient's CT scan is unremarkable. She is status post cholecystectomy, hysterectomy. Kidneys appear normal. Biliary tree appears normal. There is mild  hepatomegaly with fatty infiltration. Appendix is normal but she now states that she had an appendectomy when she was 61 years old. Diverticulosis without diverticulitis. Stomach appears normal. She reports feeling "much better". Pain is gone and she has been able to drink without difficulty. No vomiting in the ED. Suspect that this could be GERD, gastritis. She suspects that this could be related to Victoza which her doctor just started her on this week. Have advised her not to stop this medication because her blood sugar appears to be controlled as she states she is normally in the 200s to 300s and is today 167 but to call her primary care doctor for further instructions. Recommended outpatient follow-up with gastroenterology as symptoms continue for possible endoscopy. We'll discharge with Zofran, Protonix, Carafate. Discussed return precautions. She is comfortable with this plan.    At this time, I do not feel there is any life-threatening condition present. I have reviewed and discussed  all results (EKG, imaging, lab, urine as appropriate) and exam findings with patient/family. I have reviewed nursing notes and appropriate previous records.  I feel the patient is safe to be discharged home without further emergent workup and can continue workup as an outpatient as needed. Discussed usual and customary return precautions. Patient/family verbalize understanding and are comfortable with this plan.  Outpatient follow-up has been provided if needed. All questions have been answered.     EKG Interpretation  Date/Time:  Thursday November 30 2016 23:10:48 EDT Ventricular Rate:  103 PR Interval:  122 QRS Duration: 112 QT Interval:  356 QTC Calculation: 466 R Axis:   77 Text Interpretation:  Sinus tachycardia Incomplete right bundle branch block Borderline ECG No significant change since last tracing Confirmed by Chayne Baumgart,  DO, Makalia Bare [phone]) on 12/01/2016 3:23:13 AM        I personally performed the services described in this documentation, which was scribed in my presence. The recorded information has been reviewed and is accurate.    [place], DO 12/01/16 [phone]

## 2016-12-01 NOTE — ED Notes (Signed)
Patient transported to CT 

## 2016-12-04 ENCOUNTER — Ambulatory Visit (INDEPENDENT_AMBULATORY_CARE_PROVIDER_SITE_OTHER): Payer: Medicare Other | Admitting: Family Medicine

## 2016-12-04 ENCOUNTER — Encounter: Payer: Self-pay | Admitting: Family Medicine

## 2016-12-04 VITALS — BP 140/80 | HR 99 | Temp 98.5°F | Wt 192.0 lb

## 2016-12-04 DIAGNOSIS — R110 Nausea: Secondary | ICD-10-CM

## 2016-12-04 DIAGNOSIS — R079 Chest pain, unspecified: Secondary | ICD-10-CM

## 2016-12-04 DIAGNOSIS — E038 Other specified hypothyroidism: Secondary | ICD-10-CM

## 2016-12-04 DIAGNOSIS — E1165 Type 2 diabetes mellitus with hyperglycemia: Secondary | ICD-10-CM | POA: Diagnosis not present

## 2016-12-04 DIAGNOSIS — Z794 Long term (current) use of insulin: Secondary | ICD-10-CM

## 2016-12-04 DIAGNOSIS — I10 Essential (primary) hypertension: Secondary | ICD-10-CM | POA: Diagnosis not present

## 2016-12-04 NOTE — Telephone Encounter (Signed)
This started before she started taking victoza and is now seeing her pcp for stomach- patient states her sugar readings have come down but she has a concern that she has a history of pancreatitis and you put her on victoza and she feels there may be a problem with this- she has lost 5 pounds and her sugar is the best it has been so I do not know what she wants to do please advise

## 2016-12-04 NOTE — Patient Instructions (Signed)
We will set up gastric emptying study to rule out gastroparesis.

## 2016-12-04 NOTE — Progress Notes (Signed)
Pre visit review using our clinic review tool, if applicable. No additional management support is needed unless otherwise documented below in the visit note. 

## 2016-12-04 NOTE — Telephone Encounter (Signed)
She does not have any evidence of pancreatitis on her lab work from last week.  She can temporarily reduce the dose to 0.6 mg Victoza

## 2016-12-04 NOTE — Telephone Encounter (Signed)
Nausea may be from Victoza.  Is she taking 0.6 or 1.2 mg? She can stop this for 3 days and then try taking only 0.6 mg at bedtime

## 2016-12-04 NOTE — Progress Notes (Signed)
Subjective:     Patient ID: Yes[facility] Wilson, female   DOB: 22-Apr-1956, 61 y.o.   MRN: [id number]  HPI Patient is seen for transitional care follow-up. She was unfortunately scheduled for only 15 minutes. She has history of poorly controlled type 2 diabetes, obesity, dyslipidemia, hypothyroidism. She presented with chest pain and shortness of breath. She apparently had episode while attending a funeral service at 2 PM and then had another episode around 5 PM of that same day. She went to the ER that point for further evaluation. Ruled out for MI.  Catheterization revealed 30% stenosis mid RCA and 30% proximal circumflex lesion. Ejection fraction 50-55%. She had mid LAD lesion which was 30% as well. D-dimer negative. Cardiac enzymes negative.  History of poorly controlled type 2 diabetes followed by endocrinology. She currently takes regimen of Lantus and Victoza. Recent increase in Lantus dosage. She has hypertension on Cozaar and hyperlipidemia on statin. She has hypothyroidism on Synthroid. Recent TSH normal. No further chest pain since discharge. Recent chest x-ray unremarkable. She has history of pancreatitis and recent lipase was normal  Patient relates even prior to starting Victoza that she's had a couple months of some progressive postprandial early satiety and frequent nausea and vomiting after eating. She has pending follow-up with gastrologist in one week.   No abdominal pain.  Recent lipase normal.  Past Medical History:  Diagnosis Date  . Asthma   . Depression   . Diabetes mellitus   . Fibromyalgia   . Hyperlipidemia   . Hypertension   . Lupus   . Pancreatitis   . Sleep apnea   . Thyroid disease    Past Surgical History:  Procedure Laterality Date  . APPENDECTOMY    . bladder tack    . CESAREAN SECTION    . CHOLECYSTECTOMY    . LEFT HEART CATH AND CORONARY ANGIOGRAPHY N/A 11/22/2016   Procedure: Left Heart Cath and Coronary Angiography;  Surgeon: Burnell Blanks, MD;   Location: [facility];  Service: Cardiovascular;  Laterality: N/A;  . SEPTOPLASTY      reports that she has never smoked. She has never used smokeless tobacco. She reports that she does not drink alcohol or use drugs. family history includes CAD in her brother, father, and sister; Diabetes Mellitus II in her brother, father, mother, and sister. She was adopted. Allergies  Allergen Reactions  . Oxycodone-Acetaminophen     REACTION: GI upset  . Propoxyphene N-Acetaminophen     REACTION: nausea, rapid heartrate     Past Medical History:  Diagnosis Date  . Asthma   . Depression   . Diabetes mellitus   . Fibromyalgia   . Hyperlipidemia   . Hypertension   . Lupus   . Pancreatitis   . Sleep apnea   . Thyroid disease    Past Surgical History:  Procedure Laterality Date  . APPENDECTOMY    . bladder tack    . CESAREAN SECTION    . CHOLECYSTECTOMY    . LEFT HEART CATH AND CORONARY ANGIOGRAPHY N/A 11/22/2016   Procedure: Left Heart Cath and Coronary Angiography;  Surgeon: Burnell Blanks, MD;  Location: [facility];  Service: Cardiovascular;  Laterality: N/A;  . SEPTOPLASTY      reports that she has never smoked. She has never used smokeless tobacco. She reports that she does not drink alcohol or use drugs. family history includes CAD in her brother, father, and sister; Diabetes Mellitus II in her brother, father,  mother, and sister. She was adopted. Allergies  Allergen Reactions  . Oxycodone-Acetaminophen     REACTION: GI upset  . Propoxyphene N-Acetaminophen     REACTION: nausea, rapid heartrate     Review of Systems  Constitutional: Negative for fatigue.  Eyes: Negative for visual disturbance.  Respiratory: Negative for cough, chest tightness, shortness of breath and wheezing.   Cardiovascular: Negative for chest pain, palpitations and leg swelling.  Gastrointestinal: Positive for nausea. Negative for constipation and diarrhea.  Genitourinary: Negative  for dysuria.  Neurological: Negative for dizziness, seizures, syncope, weakness, light-headedness and headaches.       Objective:   Physical Exam  Constitutional: She appears well-developed and well-nourished.  Neck: Neck supple. No thyromegaly present.  Cardiovascular: Normal rate and regular rhythm.   Pulmonary/Chest: Effort normal and breath sounds normal. No respiratory distress. She has no wheezes. She has no rales.  Abdominal: Soft. There is no tenderness.  Musculoskeletal: She exhibits no edema.  Lymphadenopathy:    She has no cervical adenopathy.       Assessment:     #1 recent chest pain with catheterization as above with minimal CAD  #2 type 2 diabetes with history of poor control-followed by endocrinology.  #3 history of frequent postprandial nausea. Recent lipase normal. Rule out diabetic related gastroparesis    Plan:     -Set up NM gastric emptying study -We've encouraged her to discuss with her endocrinologist her postprandial nausea and whether Victoza may be worsening her symptoms. -she is encouraged to lose some weight.  Eulas Post MD [facility]

## 2016-12-05 NOTE — Telephone Encounter (Signed)
Patient has been made aware.

## 2016-12-06 ENCOUNTER — Telehealth: Payer: Self-pay | Admitting: Family Medicine

## 2016-12-06 DIAGNOSIS — K219 Gastro-esophageal reflux disease without esophagitis: Secondary | ICD-10-CM

## 2016-12-06 DIAGNOSIS — R1013 Epigastric pain: Secondary | ICD-10-CM

## 2016-12-06 NOTE — Telephone Encounter (Signed)
Pt states [facility] Wilson has changed her mind and wants to go to [facility] instead of [facility].  Pt would like to know who dr Elease Hashimoto would prefer her to see.there

## 2016-12-07 NOTE — Telephone Encounter (Signed)
I think anyone with [facility] would be good.

## 2016-12-08 NOTE — Telephone Encounter (Signed)
Referral placed and patient is aware. 

## 2016-12-11 ENCOUNTER — Encounter: Payer: Self-pay | Admitting: Obstetrics and Gynecology

## 2016-12-12 ENCOUNTER — Encounter: Payer: Medicare Other | Admitting: Nutrition

## 2016-12-13 ENCOUNTER — Encounter (HOSPITAL_COMMUNITY)
Admission: RE | Admit: 2016-12-13 | Discharge: 2016-12-13 | Disposition: A | Discharge status: Home / Self Care | Payer: Medicare Other | Source: Ambulatory Visit | Attending: Family Medicine | Admitting: Family Medicine

## 2016-12-13 DIAGNOSIS — R110 Nausea: Secondary | ICD-10-CM | POA: Diagnosis not present

## 2016-12-13 DIAGNOSIS — K219 Gastro-esophageal reflux disease without esophagitis: Secondary | ICD-10-CM | POA: Diagnosis not present

## 2016-12-13 MED ORDER — TECHNETIUM TC 99M SULFUR COLLOID
2.0000 | Freq: Once | INTRAVENOUS | Status: AC | PRN
  Administered 2016-12-13: 2 via INTRAVENOUS

## 2016-12-18 ENCOUNTER — Ambulatory Visit (INDEPENDENT_AMBULATORY_CARE_PROVIDER_SITE_OTHER): Payer: Medicare Other | Admitting: Physician Assistant

## 2016-12-18 ENCOUNTER — Encounter: Payer: Self-pay | Admitting: Physician Assistant

## 2016-12-18 VITALS — BP 130/80 | HR 108 | Ht 62.5 in | Wt 193.0 lb

## 2016-12-18 DIAGNOSIS — R140 Abdominal distension (gaseous): Secondary | ICD-10-CM

## 2016-12-18 DIAGNOSIS — K219 Gastro-esophageal reflux disease without esophagitis: Secondary | ICD-10-CM | POA: Diagnosis not present

## 2016-12-18 DIAGNOSIS — K760 Fatty (change of) liver, not elsewhere classified: Secondary | ICD-10-CM

## 2016-12-18 DIAGNOSIS — R1013 Epigastric pain: Secondary | ICD-10-CM

## 2016-12-18 MED ORDER — PANTOPRAZOLE SODIUM 40 MG PO TBEC: 40.0000 mg | DELAYED_RELEASE_TABLET | Freq: Two times a day (BID) | ORAL | 2 refills | Status: DC

## 2016-12-18 MED ORDER — PROMETHAZINE HCL 25 MG PO TABS: 25.0000 mg | ORAL_TABLET | Freq: Four times a day (QID) | ORAL | 1 refills | Status: DC | PRN

## 2016-12-18 NOTE — Patient Instructions (Signed)
You have been scheduled for an endoscopy. Please follow written instructions given to you at your visit today. If you use inhalers (even only as needed), please bring them with you on the day of your procedure. Your physician has requested that you go to www.startemmi.com and enter the access code given to you at your visit today. This web site gives a general overview about your procedure. However, you should still follow specific instructions given to you by our office regarding your preparation for the procedure.  We have sent the following medications to your pharmacy for you to pick up at your convenience: Increase Pantoprazole 40 mg twice a day  Promethazine 25 mg every 4-6 hours as needed for nausea.   Stop Carafate.  We will call you to have repeat bloodwork in 2 months.

## 2016-12-18 NOTE — Progress Notes (Signed)
Chief Complaint: Epigastric abdominal pain, GERD, nausea and vomiting, gas  HPI:  Yes[facility] Wilson is a 61 year old female with a past medical history depression, diabetes, fibromyalgia, lupus, pancreatitis and others listed below,  who was referred to me by Eulas Post, MD for a complaint of epigastric abdominal pain, GERD, nausea and vomiting as well as gas .     Wilson was recently seen in [facility] on 12/01/16 for persistent, severe, epigastric abdominal pain that radiated into her lower abdomen for 3 days. She also reported associated nausea, belching and loss of appetite. Eating exacerbated her pain. She described being diagnosed with pancreatitis about 6 months prior which led to admission at that time. Wilson was found to have an unremarkable CT and normal EKG and labs showing only a mild elevation of her AST at 46 and normal lipase. Wilson improved on morphine, Zofran, GI cocktail and Protonix. She was discharged with Zofran, Protonix and Carafate.   Wilson then saw her PCP on 12/04/16 and described frequent postprandial nausea. Due to her poorly controlled diabetes, gastroparesis was discussed and she was set up for a gastric emptying study.   Wilson had gastric emptying study on 12/13/16 which was normal.   Today, [facility] Wilson tells me that she started with all of her problems about 6 months ago when she was admitted for a week due to pancreatitis. Since that time [facility] Wilson tells me that she has continued to have some amount of epigastric discomfort. In fact, she has been to [facility] on 2 occasions with [facility] most recent as above. [facility] Wilson tells me that currently she has epigastric pain which "hurts deep inside", any time that she presses on this area as well as a constant dull pain made worse by eating. She also tells me that eating anything seems to make her abdomen swell and bloat and she has a lot of excess gas due to this. [facility] symptoms sometimes awaken her at night. Wilson also relays  that she will have reflux at night irregardless of her Pantoprazole 40 mg daily. She has also been using her Carafate about 2-3 minutes before eating 3 times a day.   Wilson also continues with nausea but has not had any vomiting since being seen in [facility] above. Wilson tells me they prescribed her Zofran but this does not help her at all. She tells me Phenergan has worked better for her in [facility] past.   Wilson is concerned today regarding findings of a fatty liver and hepatomegaly on her recent CT scan. She tells me that 2 of her cousins have cirrhosis and one of them has a liver cancer. She denies any alcoholic intake herself or use of any hepatotoxic substances.   Wilson denies fever, chills, blood in her stool, melena, change in diet, weight loss, fatigue, anorexia or dysphagia.  Past Medical History:  Diagnosis Date  . Asthma   . Depression   . Diabetes mellitus   . Fibromyalgia   . Hyperlipidemia   . Hypertension   . Lupus   . Pancreatitis   . Sleep apnea   . Thyroid disease     Past Surgical History:  Procedure Laterality Date  . APPENDECTOMY    . bladder tack    . CESAREAN SECTION    . CHOLECYSTECTOMY    . LEFT HEART CATH AND CORONARY ANGIOGRAPHY N/A 11/22/2016   Procedure: Left Heart Cath and Coronary Angiography;  Surgeon: Burnell Blanks, MD;  Location: [facility];  Service: Cardiovascular;  Laterality: N/A;  . SEPTOPLASTY      Current Outpatient Prescriptions  Medication Sig Dispense Refill  . ARIPiprazole (ABILIFY) 2 MG tablet TAKE 1 TABLET (2 MG TOTAL) BY MOUTH AT BEDTIME. 30 tablet 5  . aspirin 81 MG tablet Take 81 mg by mouth daily.      . DULoxetine (CYMBALTA) 60 MG capsule TAKE 1 CAPSULE (60 MG TOTAL) BY MOUTH DAILY. 90 capsule 2  . esomeprazole (NEXIUM) 40 MG capsule TAKE ONE CAPSULE BY MOUTH EVERY DAY BEFORE BREAKFAST 90 capsule 1  . estradiol (ESTRACE) 0.5 MG tablet TAKE 1 TABLET EVERY DAY 90 tablet 1  . glucose blood (ONETOUCH VERIO) test  strip Use to test blood sugar 4 times a day 200 each 5  . insulin glargine (LANTUS) 100 UNIT/ML injection USE 37 UNITS DAILY 30 mL 2  . Insulin Pen Needle (BD PEN NEEDLE NANO U/F) 32G X 4 MM MISC Use 3 per to day to inject novolog 100 each 3  . levalbuterol (XOPENEX HFA) 45 MCG/ACT inhaler Inhale 1-2 puffs into [facility] lungs 4 (four) times daily as needed for shortness of breath.     . losartan (COZAAR) 50 MG tablet TAKE 1 TABLET (50 MG TOTAL) BY MOUTH DAILY. 90 tablet 1  . metFORMIN (GLUCOPHAGE) 1000 MG tablet TAKE 1 TABLET (1,000 MG TOTAL) BY MOUTH 2 (TWO) TIMES DAILY. 180 tablet 1  . ondansetron (ZOFRAN ODT) 4 MG disintegrating tablet Take 1-2 tablets (4-8 mg total) by mouth every 8 (eight) hours as needed for nausea or vomiting. 30 tablet 0  . ONETOUCH DELICA LANCETS FINE MISC Use to check blood sugar 4 times per day. Dx Code E11.9 200 each 2  . pantoprazole (PROTONIX) 40 MG tablet Take 1 tablet (40 mg total) by mouth daily. 30 tablet 1  . rosuvastatin (CRESTOR) 20 MG tablet TAKE 1 TABLET (20 MG TOTAL) BY MOUTH DAILY. 90 tablet 1  . sucralfate (CARAFATE) 1 GM/10ML suspension Take 10 mLs (1 g total) by mouth 4 (four) times daily -  with meals and at bedtime. 420 mL 0  . SYNTHROID 125 MCG tablet TAKE 1 TABLET (125 MCG TOTAL) BY MOUTH DAILY BEFORE BREAKFAST. 90 tablet 2  . traMADol (ULTRAM) 50 MG tablet Take one to two tablets every 6 hours prn pain 120 tablet 1  . VICTOZA 18 MG/3ML SOPN Inject 0.2 mLs (1.2 mg total) into [facility] skin daily. Inject once daily at [facility] same time 2 pen 3   No current facility-administered medications for this visit.     Allergies as of 12/18/2016 - Review Complete 12/18/2016  Allergen Reaction Noted  . Oxycodone-acetaminophen  07/28/2010  . Propoxyphene n-acetaminophen  10/14/2009    Family History  Problem Relation Age of Onset  . Adopted: Yes  . Diabetes Mellitus II Mother   . CAD Father   . Diabetes Mellitus II Father   . CAD Sister   . Diabetes Mellitus II  Sister   . CAD Brother   . Diabetes Mellitus II Brother     Social History   Social History  . Marital status: Single    Spouse name: N/A  . Number of children: N/A  . Years of education: N/A   Occupational History  . Not on file.   Social History Main Topics  . Smoking status: Never Smoker  . Smokeless tobacco: Never Used  . Alcohol use No  . Drug use: No  . Sexual activity: Not on file  Other Topics Concern  . Not on file   Social History Narrative  . No narrative on file    Review of Systems:    Constitutional: Positive for fatigue Skin: Positive for itching Cardiovascular: No chest pain Respiratory: Positive for cough and shortness of breath Gastrointestinal: See HPI and otherwise negative Genitourinary: No dysuria  Neurological: No headache, dizziness or syncope Musculoskeletal: No new muscle or joint pain Hematologic: No bleeding Psychiatric: Positive for depression and sleeping problems   Physical Exam:  Vital signs: Ht 5' 2.5" (1.588 m)   Wt 193 lb (87.5 kg)   BMI 34.74 kg/m   Constitutional:   Pleasant Overweight Caucasian female appears to be in NAD, Well developed, Well nourished, alert and cooperative Head:  Normocephalic and atraumatic. Eyes:   PEERL, EOMI. No icterus. Conjunctiva pink. Ears:  Normal auditory acuity. Neck:  Supple Throat: Oral cavity and pharynx without inflammation, swelling or lesion.  Respiratory: Respirations even and unlabored. Lungs clear to auscultation bilaterally.   No wheezes, crackles, or rhonchi.  Cardiovascular: Normal S1, S2. No MRG. Regular rate and rhythm. No peripheral edema, cyanosis or pallor.  Gastrointestinal:  Soft, nondistended, Mod epigastric TTP No rebound or guarding. Normal bowel sounds. No appreciable masses or hepatomegaly. Rectal:  Not performed.  Msk:  Symmetrical without gross deformities. Without edema, no deformity or joint abnormality.  Neurologic:  Alert and  oriented x4;  grossly normal  neurologically.  Skin:   Dry and intact without significant lesions or rashes. Psychiatric: Demonstrates good judgement and reason without abnormal affect or behaviors.  MOST RECENT LABS AND IMAGING: CBC    Component Value Date/Time   WBC 15.8 (H) 11/30/2016 2320   RBC 5.10 11/30/2016 2320   HGB 14.5 11/30/2016 2320   HCT 43.9 11/30/2016 2320   PLT 386 11/30/2016 2320   MCV 86.1 11/30/2016 2320   MCH 28.4 11/30/2016 2320   MCHC 33.0 11/30/2016 2320   RDW 13.9 11/30/2016 2320   LYMPHSABS 4.6 (H) 03/02/2015 1626   MONOABS 0.2 03/02/2015 1626   EOSABS 1.2 (H) 03/02/2015 1626   BASOSABS 0.0 03/02/2015 1626    CMP     Component Value Date/Time   NA 136 11/30/2016 2320   K 3.8 11/30/2016 2320   CL 98 (L) 11/30/2016 2320   CO2 27 11/30/2016 2320   GLUCOSE 167 (H) 11/30/2016 2320   BUN 11 11/30/2016 2320   CREATININE 0.87 11/30/2016 2320   CALCIUM 10.1 11/30/2016 2320   PROT 7.8 11/30/2016 2320   ALBUMIN 4.2 11/30/2016 2320   AST 46 (H) 11/30/2016 2320   ALT 34 11/30/2016 2320   ALKPHOS 107 11/30/2016 2320   BILITOT 1.0 11/30/2016 2320   GFRNONAA >60 11/30/2016 2320   GFRAA >60 11/30/2016 2320   CT ABDOMEN AND PELVIS WITH CONTRAST 12/01/16  TECHNIQUE: Multidetector CT imaging of [facility] abdomen and pelvis was performed using [facility] standard protocol following bolus administration of intravenous contrast.  CONTRAST:  162mL ISOVUE-300 IOPAMIDOL (ISOVUE-300) INJECTION 61%  COMPARISON:  MRI of [facility] lumbar spine March 02, 2015  FINDINGS: LOWER CHEST: Lung bases are clear. Included heart size is normal. No pericardial effusion. Small fat containing paraesophageal hernia.  HEPATOBILIARY: Status post cholecystectomy. [facility] liver is diffusely hypodense compatible with steatosis with focal fatty infiltration about [facility] porta hepatis. Mild hepatomegaly.  PANCREAS: Normal.  SPLEEN: Normal.  ADRENALS/URINARY TRACT: Kidneys are orthotopic, demonstrating symmetric enhancement.  No nephrolithiasis, hydronephrosis or solid renal masses. Too small to characterize lower pole LEFT kidney. Too small to  characterize hypodensity LEFT interpolar kidney. [facility] unopacified ureters are normal in course and caliber. Delayed imaging through [facility] kidneys demonstrates symmetric prompt contrast excretion within [facility] proximal urinary collecting system. Urinary bladder is partially distended and unremarkable. Normal adrenal glands.  STOMACH/BOWEL: [facility] stomach, small and large bowel are normal in course and caliber without inflammatory changes. Mild sigmoid diverticulosis. Normal appendix.  VASCULAR/LYMPHATIC: Aortoiliac vessels are normal in course and caliber, trace calcific atherosclerosis. No lymphadenopathy by CT size criteria.  REPRODUCTIVE: Status post hysterectomy.  OTHER: No intraperitoneal free fluid or free air.  MUSCULOSKELETAL: Nonacute. Moderate sacroiliac osteoarthrosis. Large L5-S1 disc protrusion versus extrusion better characterized on prior MRI of lumbar spine.  IMPRESSION: No acute intra-abdominal or pelvic process.  Severe hepatic steatosis and mild hepatomegaly.   Electronically Signed   By: Elon Alas M.D.   On: 12/01/2016 06:05  NUCLEAR MEDICINE GASTRIC EMPTYING SCAN 12/13/16  TECHNIQUE: After oral ingestion of radiolabeled meal, sequential abdominal images were obtained for 4 hours. Percentage of activity emptying [facility] stomach was calculated at 1 hour, 2 hour, and 3 hours. Imaging at 4 hours was not required.  RADIOPHARMACEUTICALS:  2.1 mCi Tc-36m sulfur colloid in standardized meal  COMPARISON:  None  FINDINGS: Expected position of [facility] stomach in [facility] LEFT upper quadrant.  Gradual emptying of tracer from [facility] stomach during [facility] period of imaging.  40% emptying at 1 hr (normal >= 10%).  87% emptying at 2 hrs (normal >= 40%).  98% emptying at 3 hrs (normal >= 70%).  N/A emptying at 4 hrs (normal >=  90%).  Findings represent normal gastric emptying.  IMPRESSION: Normal gastric emptying study.   Electronically Signed   By: Lavonia Dana M.D.   On: 12/13/2016 14:47      Assessment: 1. Epigastric abdominal pain: Over [facility] past 6 months, started with an episode of pancreatitis, recent CT and lipase normal, Wilson with dyspepsia and reflux irregardless of Pantoprazole 40 mg daily, also normal gastric emptying study; consider H. pylori versus gastritis versus PUD versus other 2. Dyspepsia: With all [facility] above, no vomiting since [facility] beginning of March 3. Fatty liver: Seen at time of recent CT, with hepatomegaly, family history of cirrhosis, one thought to be alcohol related [facility] other not, AST minimally elevated recently 4. GERD: With above symptoms of epigastric pain and dyspepsia, typically at night 5. Bloating: Likely with epigastric abdominal pain above 6. Screening colonoscopy: pt due, will hold off on this until resolution of upper Gi symptoms  Plan: 1. Increased Wilson's Pantoprazole to 40 mg twice a day, 30-60 minutes before eating breakfast and dinner. 2. Recommend [facility] Wilson discontinue her Carafate as she has not been taking this correctly anyways and continues with symptoms 3. Prescribed promethazine 25 mg every 4-6 hours as needed for nausea #30 with 1 refill 4. Wilson is aware that she is due for a screening colonoscopy, we will hold off on this until upper GI symptoms are resolved, as she does not feel that she could stomach prep at this time. She was placed in recall for 4-6 months. 5. Ordered recheck of LFTs in 2 months 6. We did discuss fatty liver and its ability to turn into cirrhosis with time. Recommend a slow and steady weight loss of 1-2 pounds per week 7. Scheduled Wilson for an EGD with Dr. Fuller Plan, as he is [facility] supervising physician this afternoon. Discussed risks, benefits, limitations and alternatives and [facility] Wilson agrees to proceed. 8. Wilson to follow  in clinic per recommendations from  Dr. Royston Cowper, PA-C [facility] 12/18/2016, 2:01 PM  Cc: Eulas Post, MD

## 2016-12-18 NOTE — Progress Notes (Signed)
Reviewed and agree with management plan.  Carmon Brigandi T. Alazne Quant, MD FACG 

## 2016-12-22 ENCOUNTER — Ambulatory Visit: Payer: Medicare Other | Admitting: Endocrinology

## 2017-01-01 ENCOUNTER — Ambulatory Visit: Payer: Medicare Other | Admitting: Endocrinology

## 2017-01-01 ENCOUNTER — Telehealth: Payer: Self-pay | Admitting: Endocrinology

## 2017-01-01 NOTE — Telephone Encounter (Signed)
Patient no showed today's appt. Please advise on how to follow up. °A. No follow up necessary. °B. Follow up urgent. Contact patient immediately. °C. Follow up necessary. Contact patient and schedule visit in ___ days. °D. Follow up advised. Contact patient and schedule visit in ____weeks. ° °

## 2017-01-01 NOTE — Telephone Encounter (Signed)
Needs to be rescheduled for follow-up within the next month

## 2017-01-02 ENCOUNTER — Encounter: Payer: Self-pay | Admitting: Gastroenterology

## 2017-01-02 ENCOUNTER — Ambulatory Visit (AMBULATORY_SURGERY_CENTER): Payer: Medicare Other | Admitting: Gastroenterology

## 2017-01-02 VITALS — BP 118/82 | HR 102 | Temp 97.3°F | Resp 16 | Ht 62.5 in | Wt 193.0 lb

## 2017-01-02 DIAGNOSIS — R1013 Epigastric pain: Secondary | ICD-10-CM

## 2017-01-02 DIAGNOSIS — K219 Gastro-esophageal reflux disease without esophagitis: Secondary | ICD-10-CM

## 2017-01-02 MED ORDER — SODIUM CHLORIDE 0.9 % IV SOLN: 500.0000 mL | INTRAVENOUS | Status: DC

## 2017-01-02 NOTE — Progress Notes (Signed)
Report to PACU, RN, vss, BBS= Clear.  

## 2017-01-02 NOTE — Progress Notes (Signed)
No egg or soy allergy known to patient  No issues with past sedation with any surgeries  or procedures, no intubation problems  No diet pills per patient No home 02 use per patient  No blood thinners per patient  No A fib or A flutter   

## 2017-01-02 NOTE — Patient Instructions (Signed)
YOU HAD AN ENDOSCOPIC PROCEDURE TODAY AT [facility]:   Refer to the procedure report that was given to you for any specific questions about what was found during the examination.  If the procedure report does not answer your questions, please call your gastroenterologist to clarify.  If you requested that your care partner not be given the details of your procedure findings, then the procedure report has been included in a sealed envelope for you to review at your convenience later.  YOU SHOULD EXPECT: Some feelings of bloating in the abdomen. Passage of more gas than usual.  Walking can help get rid of the air that was put into your GI tract during the procedure and reduce the bloating.   Please Note:  You might notice some irritation and congestion in your nose or some drainage.  This is from the oxygen used during your procedure.  There is no need for concern and it should clear up in a day or so.  SYMPTOMS TO REPORT IMMEDIATELY:    Following upper endoscopy (EGD)  Vomiting of blood or coffee ground material  New chest pain or pain under the shoulder blades  Painful or persistently difficult swallowing  New shortness of breath  Fever of 100F or higher  Black, tarry-looking stools  For urgent or emergent issues, a gastroenterologist can be reached at any hour by calling [phone].   DIET:  We do recommend a small meal at first, but then you may proceed to your regular diet.  Drink plenty of fluids but you should avoid alcoholic beverages for 24 hours.  ACTIVITY:  You should plan to take it easy for the rest of today and you should NOT DRIVE or use heavy machinery until tomorrow (because of the sedation medicines used during the test).    FOLLOW UP: Our staff will call the number listed on your records the next business day following your procedure to check on you and address any questions or concerns that you may have regarding the information given to you  following your procedure. If we do not reach you, we will leave a message.  However, if you are feeling well and you are not experiencing any problems, there is no need to return our call.  We will assume that you have returned to your regular daily activities without incident.  If any biopsies were taken you will be contacted by phone or by letter within the next 1-3 weeks.  Please call us at [phone] if you have not heard about the biopsies in 3 weeks.    SIGNATURES/CONFIDENTIALITY: You and/or your care partner have signed paperwork which will be entered into your electronic medical record.  These signatures attest to the fact that that the information above on your After Visit Summary has been reviewed and is understood.  Full responsibility of the confidentiality of this discharge information lies with you and/or your care-partner.  Read all of the handouts given to you by your recovery room nurse.  Try to avoid acidic foods, and take OTC FDgard per Dr. Fuller Plan.

## 2017-01-02 NOTE — Op Note (Signed)
[place] Patient Name: Yesenia Wilson Procedure Date: 01/02/2017 9:04 AM MRN: [id number] Endoscopist: Ladene Artist , MD Age: 61 Referring MD:  Date of Birth: 21-Jan-1956 Gender: Female Account #: [id number] Procedure:                Upper GI endoscopy Indications:              Epigastric abdominal pain, Suspected                            gastro-esophageal reflux disease Medicines:                Monitored Anesthesia Care Procedure:                Pre-Anesthesia Assessment:                           - Prior to the procedure, a History and Physical                            was performed, and patient medications and                            allergies were reviewed. The patient's tolerance of                            previous anesthesia was also reviewed. The risks                            and benefits of the procedure and the sedation                            options and risks were discussed with the patient.                            All questions were answered, and informed consent                            was obtained. Prior Anticoagulants: The patient has                            taken no previous anticoagulant or antiplatelet                            agents. ASA Grade Assessment: III - A patient with                            severe systemic disease. After reviewing the risks                            and benefits, the patient was deemed in                            satisfactory condition to undergo the procedure.  After obtaining informed consent, the endoscope was                            passed under direct vision. Throughout the                            procedure, the patient's blood pressure, pulse, and                            oxygen saturations were monitored continuously. The                            Model GIF-[id number]) scope was introduced                            through the mouth, and  advanced to the second part                            of duodenum. The upper GI endoscopy was                            accomplished without difficulty. The patient                            tolerated the procedure well. Scope In: Scope Out: Findings:                 The examined esophagus was normal.                           A small hiatal hernia was present.                           A medium amount of food (residue) was found on the                            greater curvature of the stomach.                           The exam of the stomach was otherwise normal.                           The duodenal bulb and second portion of the                            duodenum were normal. Complications:            No immediate complications. Estimated Blood Loss:     Estimated blood loss: none. Impression:               - Normal esophagus.                           - Small hiatal hernia.                           - A  medium amount of food (residue) in the stomach.                           - Normal duodenal bulb and second portion of the                            duodenum.                           - No specimens collected. Recommendation:           - Patient has a contact number available for                            emergencies. The signs and symptoms of potential                            delayed complications were discussed with the                            patient. Return to normal activities tomorrow.                            Written discharge instructions were provided to the                            patient.                           - Resume previous diet.                           - Follow all antireflux measures.                           - Continue present medications.                           - FDgard 1-2 po tid prn Ladene Artist, MD 01/02/2017 9:24:25 AM This report has been signed electronically.

## 2017-01-03 ENCOUNTER — Telehealth: Payer: Self-pay

## 2017-01-03 NOTE — Telephone Encounter (Signed)
  Follow up Call-  Call back number 01/02/2017  Post procedure Call Back phone  # [phone]  Permission to leave phone message Yes  Some recent data might be hidden    Patient was called for follow up after her procedure on 01/02/17. No answer at the number given for follow up phone call. A message was left on the answering machine.

## 2017-01-03 NOTE — Telephone Encounter (Signed)
  Follow up Call-  Call back number 01/02/2017  Post procedure Call Back phone  # [phone]  Permission to leave phone message Yes  Some recent data might be hidden    Patient was called for follow up after her procedure on 01/02/17. No answer at the number given for follow up phone call. A message was left on voice mail.

## 2017-01-10 ENCOUNTER — Other Ambulatory Visit: Payer: Self-pay

## 2017-01-10 ENCOUNTER — Other Ambulatory Visit: Payer: Self-pay | Admitting: Endocrinology

## 2017-01-11 ENCOUNTER — Telehealth: Payer: Self-pay | Admitting: Endocrinology

## 2017-01-11 ENCOUNTER — Other Ambulatory Visit: Payer: Self-pay

## 2017-01-11 MED ORDER — INSULIN DEGLUDEC 200 UNIT/ML ~~LOC~~ SOPN: 50.0000 [IU] | PEN_INJECTOR | Freq: Every day | SUBCUTANEOUS | 3 refills | Status: DC

## 2017-01-11 NOTE — Telephone Encounter (Signed)
Need to change her Lantus to [place] U-200, 50 units daily, 1 box

## 2017-01-11 NOTE — Telephone Encounter (Signed)
Ordered

## 2017-01-11 NOTE — Telephone Encounter (Signed)
Pt called in and said that her insurance told her they will cover Basaglar, Levemir, and [place].

## 2017-01-12 ENCOUNTER — Telehealth: Payer: Self-pay | Admitting: Endocrinology

## 2017-01-12 NOTE — Telephone Encounter (Signed)
Pharmacy called and stated her insurance will not cover her Lantus needs to know what to do, please advise, thanks!

## 2017-01-12 NOTE — Telephone Encounter (Signed)
Please check to see if they will cover Toujeo or Tresiba, I would prefer disease

## 2017-01-12 NOTE — Telephone Encounter (Signed)
ERROR

## 2017-01-15 NOTE — Telephone Encounter (Signed)
Formulary only has levemir listed as a covered medication.

## 2017-01-15 NOTE — Telephone Encounter (Signed)
Please clarify which is the one you would prefer.

## 2017-01-15 NOTE — Telephone Encounter (Signed)
Please send Levemir Flex pen 30 units twice a day, 30 day supply

## 2017-01-15 NOTE — Telephone Encounter (Signed)
Yesenia Wilson, previous note had Dragon error

## 2017-01-16 ENCOUNTER — Ambulatory Visit (INDEPENDENT_AMBULATORY_CARE_PROVIDER_SITE_OTHER): Payer: Medicare Other | Admitting: Family Medicine

## 2017-01-16 ENCOUNTER — Encounter: Payer: Self-pay | Admitting: Family Medicine

## 2017-01-16 VITALS — BP 120/80 | HR 110 | Temp 98.4°F | Wt 190.4 lb

## 2017-01-16 DIAGNOSIS — H60502 Unspecified acute noninfective otitis externa, left ear: Secondary | ICD-10-CM

## 2017-01-16 MED ORDER — CIPROFLOXACIN-HYDROCORTISONE 0.2-1 % OT SUSP: 3.0000 [drp] | Freq: Two times a day (BID) | OTIC | 0 refills | Status: DC

## 2017-01-16 NOTE — Telephone Encounter (Signed)
I contacted the patient and advised of message patient stated she was able to pick the tresiba up and did not need to switch to levemir at this time.

## 2017-01-16 NOTE — Progress Notes (Signed)
Pre visit review using our clinic review tool, if applicable. No additional management support is needed unless otherwise documented below in the visit note. 

## 2017-01-16 NOTE — Progress Notes (Signed)
Subjective:     Patient ID: Yesenia Wilson, female   DOB: 04/08/1956, 61 y.o.   MRN: [id number]  HPI Patient seen with left ear pain. Onset yesterday after she used a Q-tip in her ear. She had some bleeding afterwards. No hearing loss. Today, she noticed some soreness of her canal. Minimal drainage. Denies any vertigo or dizziness. She does have history of type 2 diabetes. This is followed by endocrinology.  Past Medical History:  Diagnosis Date  . Anxiety   . Asthma   . Clotting disorder ([place])    DVT left leg between ankle and knee due to tennis injury  . COPD (chronic obstructive pulmonary disease) ([place])   . Depression   . Diabetes mellitus   . DVT (deep venous thrombosis) ([facility])    left leg  . Fibromyalgia   . GERD (gastroesophageal reflux disease)   . Heart murmur   . Hyperlipidemia   . Hypertension   . Hypothyroidism   . Lupus   . Neuromuscular disorder ([facility])    fibromyalgia  . Pancreatitis   . Pneumonia    Past Surgical History:  Procedure Laterality Date  . APPENDECTOMY    . bladder tack    . CESAREAN SECTION    . CHOLECYSTECTOMY    . COLONOSCOPY     12 yrs ago  . LEFT HEART CATH AND CORONARY ANGIOGRAPHY N/A 11/22/2016   Procedure: Left Heart Cath and Coronary Angiography;  Surgeon: Burnell Blanks, MD;  Location: [facility];  Service: Cardiovascular;  Laterality: N/A;  . SEPTOPLASTY      reports that she has never smoked. She has never used smokeless tobacco. She reports that she drinks alcohol. She reports that she does not use drugs. family history includes Breast cancer in her maternal grandmother; CAD in her brother, father, and sister; Cirrhosis in her cousin; Colon cancer in her maternal grandmother; Colon polyps in her mother; Diabetes Mellitus II in her brother, father, mother, and sister; Peptic Ulcer in her mother; Stomach cancer in her maternal grandmother. She was adopted. Allergies  Allergen Reactions  . Oxycodone-Acetaminophen    REACTION: GI upset  . Propoxyphene N-Acetaminophen     REACTION: nausea, rapid heartrate     Review of Systems  Constitutional: Negative for chills and fever.  HENT: Positive for ear discharge and ear pain. Negative for hearing loss.   Neurological: Negative for dizziness.       Objective:   Physical Exam  Constitutional: She appears well-developed and well-nourished.  HENT:  Right Ear: External ear normal.  Left ear canal reveals some diffuse erythema and edema. Eardrum only partially visualized secondary to canal edema. No obvious perforation. No clotted blood or acute bleeding noted in the canal.  Cardiovascular: Normal rate and regular rhythm.        Assessment:     Left ear pain. She has what looks like left otitis externa. Reported bleeding of the ear canal after using a Q-tip yesterday. No obvious perforation of eardrum though eardrum not fully visualized.  No evidence for malignant otitis externa    Plan:     -Keep ear canal dryness possible except for medicated drops -Ciprodex 3 drops left canal twice daily -Reassess in one week consider as needed  Eulas Post MD [facility]

## 2017-01-16 NOTE — Patient Instructions (Signed)
Keep ear dry as possible- except for medicated drops Let's plan on follow up in one week to reassess.

## 2017-01-19 ENCOUNTER — Ambulatory Visit: Payer: Medicare Other | Admitting: Endocrinology

## 2017-01-24 ENCOUNTER — Encounter: Payer: Self-pay | Admitting: Family Medicine

## 2017-01-24 ENCOUNTER — Ambulatory Visit (INDEPENDENT_AMBULATORY_CARE_PROVIDER_SITE_OTHER): Payer: Medicare Other | Admitting: Family Medicine

## 2017-01-24 VITALS — BP 140/80 | HR 94 | Temp 98.3°F | Wt 191.1 lb

## 2017-01-24 DIAGNOSIS — H60502 Unspecified acute noninfective otitis externa, left ear: Secondary | ICD-10-CM | POA: Diagnosis not present

## 2017-01-24 NOTE — Patient Instructions (Signed)
Your ear is looking much better Yesenia Wilson out the antibiotic drops.

## 2017-01-24 NOTE — Progress Notes (Signed)
Subjective:     Patient ID: Yesenia Wilson, female   DOB: [birth year], 61 y.o.   MRN: [id number]  HPI  Follow-up recent left otitis externa. Patient used a Q-tip in her ear and noticed some bleeding afterwards. We treated with Ciprodex. Her ear feels better now. No pain. No drainage. No hearing loss. No dizziness.  Past Medical History:  Diagnosis Date  . Anxiety   . Asthma   . Clotting disorder ([place])    DVT left leg between ankle and knee due to tennis injury  . COPD (chronic obstructive pulmonary disease) ([place])   . Depression   . Diabetes mellitus   . DVT (deep venous thrombosis) ([facility])    left leg  . Fibromyalgia   . GERD (gastroesophageal reflux disease)   . Heart murmur   . Hyperlipidemia   . Hypertension   . Hypothyroidism   . Lupus   . Neuromuscular disorder ([facility])    fibromyalgia  . Pancreatitis   . Pneumonia    Past Surgical History:  Procedure Laterality Date  . APPENDECTOMY    . bladder tack    . CESAREAN SECTION    . CHOLECYSTECTOMY    . COLONOSCOPY     12 yrs ago  . LEFT HEART CATH AND CORONARY ANGIOGRAPHY N/A 11/22/2016   Procedure: Left Heart Cath and Coronary Angiography;  Surgeon: Burnell Blanks, MD;  Location: [facility];  Service: Cardiovascular;  Laterality: N/A;  . SEPTOPLASTY      reports that she has never smoked. She has never used smokeless tobacco. She reports that she drinks alcohol. She reports that she does not use drugs. family history includes Breast cancer in her maternal grandmother; CAD in her brother, father, and sister; Cirrhosis in her cousin; Colon cancer in her maternal grandmother; Colon polyps in her mother; Diabetes Mellitus II in her brother, father, mother, and sister; Peptic Ulcer in her mother; Stomach cancer in her maternal grandmother. She was adopted. Allergies  Allergen Reactions  . Oxycodone-Acetaminophen     REACTION: GI upset  . Propoxyphene N-Acetaminophen     REACTION: nausea, rapid heartrate     Review of Systems  Constitutional: Negative for chills and fever.  HENT: Negative for ear discharge, ear pain and hearing loss.        Objective:   Physical Exam  Constitutional: She appears well-developed and well-nourished.  HENT:  Right Ear: External ear normal.  Mouth/Throat: Oropharynx is clear and moist.  Left ear canal much improved. Much less edema. She has little bit of dried blood along the inferior portion of the canal. No other debris. Eardrum is fully visualized with no obvious perforation  Cardiovascular: Normal rate and regular rhythm.        Assessment:     Recent left otitis externa with local trauma to the external canal improved    Plan:     -Keep all foreign bodies out of ear -Finish out Ciprodex eardrops -Follow-up for any recurrent symptoms  Eulas Post MD [facility]

## 2017-02-07 ENCOUNTER — Other Ambulatory Visit: Payer: Self-pay | Admitting: Family Medicine

## 2017-02-08 NOTE — Telephone Encounter (Signed)
Rx done. 

## 2017-02-08 NOTE — Telephone Encounter (Signed)
She has taken for Rosacea in past.  OK to refill.

## 2017-02-22 ENCOUNTER — Ambulatory Visit: Payer: Medicare Other | Admitting: Endocrinology

## 2017-02-22 ENCOUNTER — Other Ambulatory Visit: Payer: Self-pay | Admitting: Endocrinology

## 2017-02-22 DIAGNOSIS — Z0289 Encounter for other administrative examinations: Secondary | ICD-10-CM

## 2017-02-23 ENCOUNTER — Encounter: Payer: Self-pay | Admitting: Endocrinology

## 2017-02-23 ENCOUNTER — Ambulatory Visit (INDEPENDENT_AMBULATORY_CARE_PROVIDER_SITE_OTHER): Payer: Medicare Other | Admitting: Endocrinology

## 2017-02-23 VITALS — BP 136/84 | HR 104 | Ht 63.0 in | Wt 190.6 lb

## 2017-02-23 DIAGNOSIS — E063 Autoimmune thyroiditis: Secondary | ICD-10-CM

## 2017-02-23 DIAGNOSIS — E1165 Type 2 diabetes mellitus with hyperglycemia: Secondary | ICD-10-CM

## 2017-02-23 DIAGNOSIS — Z794 Long term (current) use of insulin: Secondary | ICD-10-CM | POA: Diagnosis not present

## 2017-02-23 LAB — POCT GLYCOSYLATED HEMOGLOBIN (HGB A1C): Hemoglobin A1C: 8.9

## 2017-02-23 LAB — BASIC METABOLIC PANEL
BUN: 16 mg/dL (ref 6–23)
CALCIUM: 10.1 mg/dL (ref 8.4–10.5)
CO2: 26 meq/L (ref 19–32)
CREATININE: 0.89 mg/dL (ref 0.40–1.20)
Chloride: 102 mEq/L (ref 96–112)
GFR: 68.18 mL/min (ref >60.00)
GLUCOSE: 175 mg/dL — AB (ref 70–99)
Potassium: 3.9 mEq/L (ref 3.5–5.1)
SODIUM: 139 meq/L (ref 135–145)

## 2017-02-23 LAB — TSH: TSH: 0.5 u[IU]/mL (ref 0.35–4.50)

## 2017-02-23 NOTE — Patient Instructions (Signed)
Check blood sugars on waking up  at least every other day  Also check blood sugars DAILY about 2 hours after a meal and do this after different meals by rotation  Recommended blood sugar levels on waking up is 90-130 and about 2 hours after meal is 130-160  Please bring your blood sugar monitor to each visit, thank you  VICTOZA: Take 1.8 mg daily  TRESIBA: Increase the dose to 54 units  HUMALOG insulin: Start taking 10 units before at least the main meal once a day and then if your blood sugars are generally over 180 with the other meals start taking it with breakfast and lunch also  After a week if your sugars are still going over 180 after meals increase the dose to 15-20 units   for at least the larger meals  Cut back on high-fat high carbohydrate meals

## 2017-02-23 NOTE — Progress Notes (Signed)
Patient ID: Yesenia Wilson, female   DOB: 1956/03/18, 61 y.o.   MRN: [id number]            Reason for Appointment: Follow-up for Type 2 Diabetes   History of Present Illness:          Date of diagnosis of type 2 diabetes mellitus: 2002       Background history:   She had been on metformin for several years with fair control Her blood sugars started increasing in 2016 with A1c over 7% Blood sugars were markedly increased with glucose of 440 in 2/17 and she may not have been regular with her metformin at that time.  She was given Amaryl also but subsequently blood sugars were continuing to be high She was tried on Trulicity in April 7371 but apparently this did not help her blood sugars  Recent history:   INSULIN regimen is: [place] 50 units daily Humalog Insulin: Not taken  Non-insulin hypoglycemic drugs the patient is taking are: Metformin 1 g twice a day, Victoza 1.2 mg daily  A1c is now 8.9 present, previously was over 17% during her hospitalization in October  She did not come back for short-term follow-up as instructed on her last visit  Current management, blood sugar patterns and problems identified:  She has overall somewhat better blood sugars compared to her last visit with increasing her basal insulin, switching to [place] and starting Victoza in March  However her blood sugars are quite inconsistent in the morning and ranging from 150-279  She has not done readings after meals  Victoza was started partly because of her complaint of excessive hunger which is only somewhat better  She was also told to take 15 units of Humalog with every meal but she has not done so  Her weight is about the same recently  She has not been able to comply with day-to-day regimen nor exercise because of various family related problems  Also diet has been inconsistent, today she had a bacon biscuit before coming here  Also she still has not been able to set up appointment with  dietitian   Side effects from medications have been: None  Compliance with the medical regimen: Inadequate  Hypoglycemia: None  Glucose monitoring:  done 1 times a day         Glucometer: One Touch.      Blood Glucose readings by time of day and averages from meter download:  Average 216 FASTING range: 150-279   Self-care: The diet that the patient has been following is: tries to limit Carbs.     Typical meal intake: Breakfast is Boiled eggs, tomato.  Lunch is usually [place] sandwich with fruit, sometimes salad.  Evening meal is salad or soup               Dietician visit, most recent: Pending               Exercise:  started going to the gym 1 week   Weight history:  Wt Readings from Last 3 Encounters:  02/23/17 190 lb 9.6 oz (86.5 kg)  01/24/17 191 lb 1.6 oz (86.7 kg)  01/16/17 190 lb 6.4 oz (86.4 kg)    Glycemic control:     Lab Results  Component Value Date   HGBA1C 8.9 02/23/2017   HGBA1C 10.9 (H) 11/21/2016   HGBA1C 17.3 06/26/2016   Lab Results  Component Value Date   MICROALBUR 3.0 (H) 08/03/2016   LDLCALC 109 (H) 10/07/2014  CREATININE 0.89 02/23/2017   Lab Results  Component Value Date   MICRALBCREAT 2.6 08/03/2016    Other active problems: See review of systems   Allergies as of 02/23/2017      Reactions   Oxycodone-acetaminophen    REACTION: GI upset   Propoxyphene N-acetaminophen    REACTION: nausea, rapid heartrate      Medication List       Accurate as of 02/23/17 11:59 PM. Always use your most recent med list.          ARIPiprazole 2 MG tablet Commonly known as:  ABILIFY TAKE 1 TABLET (2 MG TOTAL) BY MOUTH AT BEDTIME.   aspirin 81 MG tablet Take 81 mg by mouth daily.   ciprofloxacin-hydrocortisone OTIC suspension Commonly known as:  CIPRO HC Place 3 drops into the left ear 2 (two) times daily.   DULoxetine 60 MG capsule Commonly known as:  CYMBALTA TAKE 1 CAPSULE (60 MG TOTAL) BY MOUTH DAILY.   esomeprazole 40 MG  capsule Commonly known as:  NEXIUM TAKE ONE CAPSULE BY MOUTH EVERY DAY BEFORE BREAKFAST   estradiol 0.5 MG tablet Commonly known as:  ESTRACE TAKE 1 TABLET EVERY DAY   glucose blood test strip Commonly known as:  ONETOUCH VERIO Use to test blood sugar 4 times a day   Insulin Degludec 200 UNIT/ML Sopn Commonly known as:  TRESIBA FLEXTOUCH Inject 50 Units into the skin daily.   losartan 50 MG tablet Commonly known as:  COZAAR TAKE 1 TABLET (50 MG TOTAL) BY MOUTH DAILY.   metFORMIN 1000 MG tablet Commonly known as:  GLUCOPHAGE TAKE 1 TABLET (1,000 MG TOTAL) BY MOUTH 2 (TWO) TIMES DAILY.   metroNIDAZOLE 0.75 % gel Commonly known as:  METROGEL APPLY TOPICALLY DAILY.   ondansetron 4 MG disintegrating tablet Commonly known as:  ZOFRAN ODT Take 1-2 tablets (4-8 mg total) by mouth every 8 (eight) hours as needed for nausea or vomiting.   ONETOUCH DELICA LANCETS FINE Misc Use to check blood sugar 4 times per day. Dx Code E11.9   pantoprazole 40 MG tablet Commonly known as:  PROTONIX Take 1 tablet (40 mg total) by mouth daily.   promethazine 25 MG tablet Commonly known as:  PHENERGAN Take 1 tablet (25 mg total) by mouth every 6 (six) hours as needed for nausea or vomiting.   rosuvastatin 20 MG tablet Commonly known as:  CRESTOR TAKE 1 TABLET (20 MG TOTAL) BY MOUTH DAILY.   SYNTHROID 125 MCG tablet Generic drug:  levothyroxine TAKE 1 TABLET (125 MCG TOTAL) BY MOUTH DAILY BEFORE BREAKFAST.   traMADol 50 MG tablet Commonly known as:  ULTRAM Take one to two tablets every 6 hours prn pain   VICTOZA 18 MG/3ML Sopn Generic drug:  liraglutide Inject 0.2 mLs (1.2 mg total) into the skin daily. Inject once daily at the same time   [facility] HFA 45 MCG/ACT inhaler Generic drug:  levalbuterol Inhale 1-2 puffs into the lungs 4 (four) times daily as needed for shortness of breath.       Allergies:  Allergies  Allergen Reactions  . Oxycodone-Acetaminophen     REACTION: GI  upset  . Propoxyphene N-Acetaminophen     REACTION: nausea, rapid heartrate    Past Medical History:  Diagnosis Date  . Anxiety   . Asthma   . Clotting disorder ([place])    DVT left leg between ankle and knee due to tennis injury  . COPD (chronic obstructive pulmonary disease) ([place])   . Depression   .  Diabetes mellitus   . DVT (deep venous thrombosis) ([facility])    left leg  . Fibromyalgia   . GERD (gastroesophageal reflux disease)   . Heart murmur   . Hyperlipidemia   . Hypertension   . Hypothyroidism   . Lupus   . Neuromuscular disorder ([facility])    fibromyalgia  . Pancreatitis   . Pneumonia     Past Surgical History:  Procedure Laterality Date  . APPENDECTOMY    . bladder tack    . CESAREAN SECTION    . CHOLECYSTECTOMY    . COLONOSCOPY     12 yrs ago  . LEFT HEART CATH AND CORONARY ANGIOGRAPHY N/A 11/22/2016   Procedure: Left Heart Cath and Coronary Angiography;  Surgeon: Burnell Blanks, MD;  Location: [facility];  Service: Cardiovascular;  Laterality: N/A;  . SEPTOPLASTY      Family History  Problem Relation Age of Onset  . Adopted: Yes  . Diabetes Mellitus II Mother   . Colon polyps Mother   . Peptic Ulcer Mother   . CAD Father   . Diabetes Mellitus II Father   . CAD Sister   . Diabetes Mellitus II Sister   . CAD Brother   . Diabetes Mellitus II Brother   . Breast cancer Maternal Grandmother   . Stomach cancer Maternal Grandmother   . Colon cancer Maternal Grandmother   . Cirrhosis Cousin        x 2  . Esophageal cancer Neg Hx   . Rectal cancer Neg Hx     Social History:  reports that she has never smoked. She has never used smokeless tobacco. She reports that she drinks alcohol. She reports that she does not use drugs.   Review of Systems  Psychiatric/Behavioral: Positive for depressed mood.    Lipid history: She has been on Crestor for hyperlipidemia. Triglycerides Were higher in 2017    Lab Results  Component Value Date   CHOL 266  (H) 08/03/2016   HDL 48.10 08/03/2016   LDLCALC 109 (H) 10/07/2014   LDLDIRECT 208.0 08/03/2016   TRIG 221.0 (H) 08/03/2016   CHOLHDL 6 08/03/2016           Hypertension:   She is currently on low-dose 50 mg losartan With fair control  Most recent eye exam was 5/17   Most recent foot exam:10/17  HYPOTHYROIDISM: She thinks she has been hypothyroid since age of 1 and at that time she was having fatigue and lethargy. Her dosage was increased in 5/17, now taking 125 g  TSH is pending from today   Lab Results  Component Value Date   TSH 0.50 02/23/2017   TSH 2.90 07/05/2016   TSH 7.42 (H) 02/15/2016   FREET4 1.33 10/01/2009    Depression: She has been continued on Cymbalta, followed by PCP    Physical Examination:  BP 136/84   Pulse (!) 104   Ht 5\' 3"  (1.6 m)   Wt 190 lb 9.6 oz (86.5 kg)   SpO2 96%   BMI 33.76 kg/m   Appears somewhat anxious  ASSESSMENT:  Diabetes type 2, uncontrolled   See history of present illness for detailed discussion of current diabetes management, blood sugar patterns and problems identified  Although his A1c is improving progressively her blood sugars are still poorly controlled with fasting average over 200 She has difficulty compliant with diet and a sister rise regimen and has not lost weight Despite taking Victoza her blood sugars are only somewhat  controlled although she is doing a little better satiety. However not clear if she is having high post pending readings as she is not monitoring Overall control better with consistent compliance with she has some limitations because of family issues   HYPERTENSION: Fair control, continue follow-up with PCP  HYPERLIPIDEMIA: Has a tendency to persistently high triglycerides, will need reevaluation of  lipids when blood sugars are better controlled  Hypothyroidism: Will need follow-up TSH   PLAN for diabetes:    Start taking 1.8 mg Victoza  Start taking Humalog at least 10 units  before her main meal and most likely may need to take as much as 20 units if postprandial readings are still over 180  She needs to follow a low-fat diet  Increase Tresiba 54 units for now, discussed needing to keep fasting readings consistent with this  Consistent exercise  On her instructions were printed and reviewed in detail to ensure compliance  She does need to monitor readings after meals as discussed today and blood sugar targets discussed  Will need short-term follow-up to reassess her management especially since she needs to be able to give [place] more information about her postprandial readings, she does have option of using  V-go pump for better compliance  She will discuss options for anxiety and depression with PCP, not clear if she may need additional Wellbutrin for improved motivation    Patient Instructions  Check blood sugars on waking up  at least every other day  Also check blood sugars DAILY about 2 hours after a meal and do this after different meals by rotation  Recommended blood sugar levels on waking up is 90-130 and about 2 hours after meal is 130-160  Please bring your blood sugar monitor to each visit, thank you  VICTOZA: Take 1.8 mg daily  TRESIBA: Increase the dose to 54 units  HUMALOG insulin: Start taking 10 units before at least the main meal once a day and then if your blood sugars are generally over 180 with the other meals start taking it with breakfast and lunch also  After a week if your sugars are still going over 180 after meals increase the dose to 15-20 units   for at least the larger meals  Cut back on high-fat high carbohydrate meals    Counseling time on subjects discussed above is over 50% of today's 25 minute visit    Matheo Rathbone 02/25/2017, 8:35 PM   Note: This office note was prepared with Dragon voice recognition system technology. Any transcriptional errors that result from this process are unintentional.

## 2017-02-27 ENCOUNTER — Telehealth: Payer: Self-pay | Admitting: Endocrinology

## 2017-02-27 ENCOUNTER — Other Ambulatory Visit: Payer: Self-pay | Admitting: Family Medicine

## 2017-02-27 NOTE — Telephone Encounter (Signed)
Patient states she was supposed to have humalog sent to pharmacy yesterday after seeing provider, was never called in. Asked to have medication called in to :   [id number] [place], [place] [address] [phone] (Phone) [phone] (Fax)

## 2017-02-28 ENCOUNTER — Other Ambulatory Visit: Payer: Self-pay

## 2017-02-28 MED ORDER — INSULIN LISPRO 100 UNIT/ML (KWIKPEN): PEN_INJECTOR | SUBCUTANEOUS | 3 refills | Status: DC

## 2017-02-28 NOTE — Telephone Encounter (Signed)
Called patient and let her know that I sent her Humalog prescription to the pharmacy for her.

## 2017-03-01 ENCOUNTER — Other Ambulatory Visit: Payer: Self-pay

## 2017-03-01 ENCOUNTER — Telehealth: Payer: Self-pay

## 2017-03-01 MED ORDER — INSULIN ASPART 100 UNIT/ML FLEXPEN: PEN_INJECTOR | SUBCUTANEOUS | 3 refills | Status: DC

## 2017-03-01 NOTE — Telephone Encounter (Addendum)
Patient called to check on rx due to [facility] that [facility] not [facility]. I spoke to [place] B who said she will make sure that she resends it via fax. I told patient to wait an hour and then check again with the pharmacy.  Patient also would like a call once her lab results are ready.

## 2017-03-01 NOTE — Telephone Encounter (Signed)
Called patient to let her know that her labs were good except her blood sugar was elevated.

## 2017-03-01 NOTE — Telephone Encounter (Signed)
Her thyroid level and chemistry panel okay except blood sugar 175

## 2017-03-01 NOTE — Telephone Encounter (Signed)
Patient called to verify if the rx for the Humalog was faxed on [facility]/28/2017 [facility] [place]? She stated the pharmacy advised her they did not have a prescription?

## 2017-03-01 NOTE — Telephone Encounter (Signed)
[facility] that insulin lispro (HUMALOG KWIKPEN) 100 UNIT/ML KiwkPen [[id number]] is not covered. She says that novalog is covered. She asks for a new script to be submitted.

## 2017-03-01 NOTE — Telephone Encounter (Signed)
[facility] make sure [facility] received the new fax for the Novolog and they said they received the fax.

## 2017-03-01 NOTE — Telephone Encounter (Signed)
Called patient to let her know that I have faxed and called [facility] and they have received the fax for her new prescription for her  Novolog insulin and will fill for her today. Also she asked if her labs had been reviewed because she has not heard back about them and wanted to know if everything was okay. Please advise.

## 2017-03-07 ENCOUNTER — Telehealth: Payer: Self-pay | Admitting: Family Medicine

## 2017-03-07 MED ORDER — FLUCONAZOLE 150 MG PO TABS: 150.0000 mg | ORAL_TABLET | Freq: Once | ORAL | 0 refills | Status: AC

## 2017-03-07 NOTE — Telephone Encounter (Signed)
Rx sent. Patient is aware.  

## 2017-03-07 NOTE — Telephone Encounter (Signed)
Pt has another yeast infection and would like diflucan sent to [facility]

## 2017-03-07 NOTE — Telephone Encounter (Signed)
Refill Fluconazole 150 mg po times one dose.

## 2017-03-14 ENCOUNTER — Encounter: Payer: Self-pay | Admitting: Gastroenterology

## 2017-03-19 ENCOUNTER — Telehealth: Payer: Self-pay | Admitting: Endocrinology

## 2017-03-19 ENCOUNTER — Other Ambulatory Visit: Payer: Self-pay | Admitting: Family Medicine

## 2017-03-19 MED ORDER — METFORMIN HCL 1000 MG PO TABS: ORAL_TABLET | ORAL | 1 refills | Status: DC

## 2017-03-19 NOTE — Telephone Encounter (Signed)
Patient needs her metformin 1000 mg BID rx sent to Dr. Elease Hashimoto. Dr. Elease Hashimoto is who the wrote the rx originally. Patient has been without metformin for a week. [facility]

## 2017-03-19 NOTE — Telephone Encounter (Signed)
Refilled

## 2017-03-23 ENCOUNTER — Other Ambulatory Visit: Payer: Self-pay | Admitting: Family Medicine

## 2017-03-26 ENCOUNTER — Other Ambulatory Visit: Payer: Self-pay | Admitting: Endocrinology

## 2017-03-29 ENCOUNTER — Other Ambulatory Visit: Payer: Self-pay | Admitting: Physician Assistant

## 2017-04-09 DIAGNOSIS — M5116 Intervertebral disc disorders with radiculopathy, lumbar region: Secondary | ICD-10-CM | POA: Diagnosis not present

## 2017-04-09 DIAGNOSIS — M47816 Spondylosis without myelopathy or radiculopathy, lumbar region: Secondary | ICD-10-CM | POA: Diagnosis not present

## 2017-04-10 ENCOUNTER — Telehealth: Payer: Self-pay | Admitting: Endocrinology

## 2017-04-10 ENCOUNTER — Ambulatory Visit: Payer: Medicare Other | Admitting: Endocrinology

## 2017-04-10 DIAGNOSIS — Z0289 Encounter for other administrative examinations: Secondary | ICD-10-CM

## 2017-04-10 NOTE — Telephone Encounter (Signed)
Patient no showed today's appt. Please advise on how to follow up. °A. No follow up necessary. °B. Follow up urgent. Contact patient immediately. °C. Follow up necessary. Contact patient and schedule visit in ___ days. °D. Follow up advised. Contact patient and schedule visit in ____weeks. ° °

## 2017-04-10 NOTE — Telephone Encounter (Signed)
Needs to be scheduled for follow-up as soon as possible

## 2017-04-12 NOTE — Telephone Encounter (Signed)
LM for pt to call back to schedule °

## 2017-04-22 ENCOUNTER — Other Ambulatory Visit: Payer: Self-pay | Admitting: Endocrinology

## 2017-04-26 ENCOUNTER — Telehealth: Payer: Self-pay | Admitting: Family Medicine

## 2017-04-26 NOTE — Telephone Encounter (Signed)
Pt calling stating that she has another yeast infection and would like to see if something can be call in.  Pharm[facility]

## 2017-04-27 MED ORDER — FLUCONAZOLE 150 MG PO TABS: 150.0000 mg | ORAL_TABLET | Freq: Once | ORAL | 0 refills | Status: AC

## 2017-04-27 NOTE — Telephone Encounter (Signed)
Rx done. 

## 2017-04-27 NOTE — Telephone Encounter (Signed)
Fluconazole 150mg #1 dose

## 2017-04-30 DIAGNOSIS — M5116 Intervertebral disc disorders with radiculopathy, lumbar region: Secondary | ICD-10-CM | POA: Diagnosis not present

## 2017-05-05 DIAGNOSIS — G8929 Other chronic pain: Secondary | ICD-10-CM | POA: Diagnosis not present

## 2017-05-05 DIAGNOSIS — Z9049 Acquired absence of other specified parts of digestive tract: Secondary | ICD-10-CM | POA: Diagnosis not present

## 2017-05-05 DIAGNOSIS — E785 Hyperlipidemia, unspecified: Secondary | ICD-10-CM | POA: Diagnosis not present

## 2017-05-05 DIAGNOSIS — M5432 Sciatica, left side: Secondary | ICD-10-CM | POA: Diagnosis not present

## 2017-05-05 DIAGNOSIS — R200 Anesthesia of skin: Secondary | ICD-10-CM | POA: Diagnosis not present

## 2017-05-05 DIAGNOSIS — R29810 Facial weakness: Secondary | ICD-10-CM | POA: Diagnosis not present

## 2017-05-05 DIAGNOSIS — R32 Unspecified urinary incontinence: Secondary | ICD-10-CM | POA: Diagnosis not present

## 2017-05-05 DIAGNOSIS — E669 Obesity, unspecified: Secondary | ICD-10-CM | POA: Diagnosis not present

## 2017-05-05 DIAGNOSIS — E039 Hypothyroidism, unspecified: Secondary | ICD-10-CM | POA: Diagnosis not present

## 2017-05-05 DIAGNOSIS — Z794 Long term (current) use of insulin: Secondary | ICD-10-CM | POA: Diagnosis not present

## 2017-05-05 DIAGNOSIS — I6789 Other cerebrovascular disease: Secondary | ICD-10-CM | POA: Diagnosis not present

## 2017-05-05 DIAGNOSIS — J449 Chronic obstructive pulmonary disease, unspecified: Secondary | ICD-10-CM | POA: Diagnosis not present

## 2017-05-05 DIAGNOSIS — E1165 Type 2 diabetes mellitus with hyperglycemia: Secondary | ICD-10-CM | POA: Diagnosis not present

## 2017-05-05 DIAGNOSIS — B373 Candidiasis of vulva and vagina: Secondary | ICD-10-CM | POA: Diagnosis not present

## 2017-05-05 DIAGNOSIS — M5117 Intervertebral disc disorders with radiculopathy, lumbosacral region: Secondary | ICD-10-CM | POA: Diagnosis not present

## 2017-05-05 DIAGNOSIS — I482 Chronic atrial fibrillation: Secondary | ICD-10-CM | POA: Diagnosis not present

## 2017-05-05 DIAGNOSIS — K219 Gastro-esophageal reflux disease without esophagitis: Secondary | ICD-10-CM | POA: Diagnosis not present

## 2017-05-05 DIAGNOSIS — Z90710 Acquired absence of both cervix and uterus: Secondary | ICD-10-CM | POA: Diagnosis not present

## 2017-05-05 DIAGNOSIS — Z6835 Body mass index (BMI) 35.0-35.9, adult: Secondary | ICD-10-CM | POA: Diagnosis not present

## 2017-05-05 DIAGNOSIS — G4733 Obstructive sleep apnea (adult) (pediatric): Secondary | ICD-10-CM | POA: Diagnosis not present

## 2017-05-05 DIAGNOSIS — Z8249 Family history of ischemic heart disease and other diseases of the circulatory system: Secondary | ICD-10-CM | POA: Diagnosis not present

## 2017-05-05 DIAGNOSIS — I10 Essential (primary) hypertension: Secondary | ICD-10-CM | POA: Diagnosis not present

## 2017-05-05 DIAGNOSIS — Z79899 Other long term (current) drug therapy: Secondary | ICD-10-CM | POA: Diagnosis not present

## 2017-05-05 DIAGNOSIS — G4730 Sleep apnea, unspecified: Secondary | ICD-10-CM | POA: Diagnosis not present

## 2017-05-08 ENCOUNTER — Other Ambulatory Visit: Payer: Self-pay | Admitting: Endocrinology

## 2017-05-08 ENCOUNTER — Telehealth: Payer: Self-pay | Admitting: Endocrinology

## 2017-05-08 DIAGNOSIS — M48061 Spinal stenosis, lumbar region without neurogenic claudication: Secondary | ICD-10-CM | POA: Diagnosis not present

## 2017-05-08 DIAGNOSIS — M5126 Other intervertebral disc displacement, lumbar region: Secondary | ICD-10-CM | POA: Diagnosis not present

## 2017-05-08 DIAGNOSIS — M5116 Intervertebral disc disorders with radiculopathy, lumbar region: Secondary | ICD-10-CM | POA: Diagnosis not present

## 2017-05-08 DIAGNOSIS — M4807 Spinal stenosis, lumbosacral region: Secondary | ICD-10-CM | POA: Diagnosis not present

## 2017-05-08 DIAGNOSIS — M5127 Other intervertebral disc displacement, lumbosacral region: Secondary | ICD-10-CM | POA: Diagnosis not present

## 2017-05-08 DIAGNOSIS — M545 Low back pain: Secondary | ICD-10-CM | POA: Diagnosis not present

## 2017-05-08 NOTE — Telephone Encounter (Signed)
Patient called to get a hospital follow up as soon as possible due to being discharged from [facility] yesterday and being taken off of a medication (patient not sure what medication). Dr. Dwyane Dee does not have any 30 min appointments available this week or next week. Call patient to advise.

## 2017-05-09 NOTE — Progress Notes (Signed)
Patient ID: Yesenia Wilson, female   DOB: [birth year], 61 y.o.   MRN: [id number]            Reason for Appointment: Follow-up for Type 2 Diabetes   History of Present Illness:          Date of diagnosis of type 2 diabetes mellitus: 2002       Background history:   She had been on metformin for several years with fair control Her blood sugars started increasing in 2016 with A1c over 7% Blood sugars were markedly increased with glucose of 440 in 2/17 and she may not have been regular with her metformin at that time.  She was given Amaryl also but subsequently blood sugars were continuing to be high She was tried on Trulicity in April 2774 but apparently this did not help her blood sugars  Recent history:   INSULIN regimen is: [place] 50 units daily Novolog Insulin: 15 units before meals  Non-insulin hypoglycemic drugs the patient is taking are: Metformin 1 g twice a day, Victoza 0.6 mg daily  A1c is not available recently and previously was 8.9  She did not come back for short-term follow-up as instructed on her last visit in June and has been irregular with follow-up  Current management, blood sugar patterns and problems identified:  She has much higher blood sugars compared to her last visit and not clear why  She is continuing to take her Tyler Aas as before although she has not taken her Victoza as directed  She was told to increase the dose up to the1.8 mg dosage but she thinks she was having nausea and is taking only 0.6 mg now FASTING blood sugars are persistently high and averaging well over 200  Since her hospitalization last week and she was also told to take NovoLog before meals, previously had been told to take Humalog consistently but would not do so  She thinks she is taking the NovoLog twice a day but is not checking her glucose later in the day except a couple of times  Although she thinks she is trying to improve her diet and cut back on regular soft drinks and  sweet tea she is having difficulty watching her snacks late at night and during the night; last night had pizza during the night.  She is refusing to take Invokana because of fear of side effects  She thinks that Trulicity may have worked before the as     Side effects from medications have been: None  Compliance with the medical regimen: Inadequate  Hypoglycemia: None  Glucose monitoring:  done 1 times a day         Glucometer: One Touch.      Blood Glucose readings by time of day and averages from meter download:  Mean values apply above for all meters except median for One Touch  PRE-MEAL Fasting Lunch Dinner Bedtime Overall  Glucose range: 240-497  600, 323 319   Mean/median: 346    327   Self-care: The diet that the patient has been following is: tries to limit Carbs.     Typical meal intake: Breakfast is Boiled eggs, tomato.  Lunch is usually [place] sandwich with fruit, sometimes salad.  Evening meal is salad or soup               Dietician visit, most recent: Years ago               Exercise: Unable to do any now because  of back pain  Weight history:  Wt Readings from Last 3 Encounters:  05/10/17 195 lb 12.8 oz (88.8 kg)  02/23/17 190 lb 9.6 oz (86.5 kg)  01/24/17 191 lb 1.6 oz (86.7 kg)    Glycemic control:     Lab Results  Component Value Date   HGBA1C 8.9 02/23/2017   HGBA1C 10.9 (H) 11/21/2016   HGBA1C 17.3 06/26/2016   Lab Results  Component Value Date   MICROALBUR 3.0 (H) 08/03/2016   LDLCALC 109 (H) 10/07/2014   CREATININE 0.89 02/23/2017   Lab Results  Component Value Date   MICRALBCREAT 2.6 08/03/2016    Other active problems: See review of systems   Allergies as of 05/10/2017      Reactions   Oxycodone-acetaminophen    REACTION: GI upset   Propoxyphene N-acetaminophen    REACTION: nausea, rapid heartrate      Medication List       Accurate as of 05/10/17 11:25 AM. Always use your most recent med list.          ARIPiprazole 2 MG  tablet Commonly known as:  ABILIFY TAKE 1 TABLET (2 MG TOTAL) BY MOUTH AT BEDTIME.   aspirin 81 MG tablet Take 81 mg by mouth daily.   BD PEN NEEDLE NANO U/F 32G X 4 MM Misc Generic drug:  Insulin Pen Needle USE 3 PER TO DAY TO INJECT NOVOLOG   BD PEN NEEDLE NANO U/F 32G X 4 MM Misc Generic drug:  Insulin Pen Needle USE 3 PER TO DAY TO INJECT NOVOLOG   ciprofloxacin-hydrocortisone OTIC suspension Commonly known as:  CIPRO HC Place 3 drops into the left ear 2 (two) times daily.   DULoxetine 60 MG capsule Commonly known as:  CYMBALTA TAKE 1 CAPSULE (60 MG TOTAL) BY MOUTH DAILY.   esomeprazole 40 MG capsule Commonly known as:  NEXIUM TAKE ONE CAPSULE BY MOUTH EVERY DAY BEFORE BREAKFAST   estradiol 0.5 MG tablet Commonly known as:  ESTRACE TAKE 1 TABLET EVERY DAY   fentaNYL 50 MCG/HR Commonly known as:  DURAGESIC - dosed mcg/hr Place 50 mcg onto the skin every 3 (three) days.   gabapentin 300 MG capsule Commonly known as:  NEURONTIN Take 300 mg by mouth 3 (three) times daily.   glucose blood test strip Commonly known as:  ONETOUCH VERIO Use to test blood sugar 4 times a day   hydrochlorothiazide 12.5 MG tablet Commonly known as:  HYDRODIURIL Take 12.5 mg by mouth daily.   Insulin Degludec 200 UNIT/ML Sopn Commonly known as:  TRESIBA FLEXTOUCH Inject 50 Units into the skin daily.   insulin lispro 100 UNIT/ML KiwkPen Commonly known as:  HUMALOG KWIKPEN Give 10 units before at least the main meal once a day, then if your blood sugars are generally over 180 with the other meals start taking it with breakfast and lunch also After a week if your sugars are still going over 180 after meals increase the dose to 15-20 units   for at least the larger meals units at the biggest meal   LEVEMIR 100 UNIT/ML injection Generic drug:  insulin detemir Inject 25 Units into the skin at bedtime.   losartan 50 MG tablet Commonly known as:  COZAAR TAKE 1 TABLET (50 MG TOTAL) BY  MOUTH DAILY.   metFORMIN 1000 MG tablet Commonly known as:  GLUCOPHAGE TAKE 1 TABLET (1,000 MG TOTAL) BY MOUTH 2 (TWO) TIMES DAILY.   metroNIDAZOLE 0.75 % gel Commonly known as:  METROGEL APPLY TOPICALLY  DAILY.   NOVOLOG FLEXPEN 100 UNIT/ML FlexPen Generic drug:  insulin aspart 10 UNITS BEFORE MAIN MEAL DAILY. IF SUGARS >180,TAKE WITH OTHER MEAL(S) ALSO. ADD 15-20U IF NEEDED   ondansetron 4 MG disintegrating tablet Commonly known as:  ZOFRAN ODT Take 1-2 tablets (4-8 mg total) by mouth every 8 (eight) hours as needed for nausea or vomiting.   ONETOUCH DELICA LANCETS FINE Misc Use to check blood sugar 4 times per day. Dx Code E11.9   oxyCODONE-acetaminophen 5-325 MG tablet Commonly known as:  PERCOCET/ROXICET Take by mouth every 6 (six) hours as needed for severe pain.   pantoprazole 40 MG tablet Commonly known as:  PROTONIX TAKE 1 TABLET (40 MG TOTAL) BY MOUTH 2 (TWO) TIMES DAILY.   promethazine 25 MG tablet Commonly known as:  PHENERGAN Take 1 tablet (25 mg total) by mouth every 6 (six) hours as needed for nausea or vomiting.   rosuvastatin 20 MG tablet Commonly known as:  CRESTOR TAKE 1 TABLET (20 MG TOTAL) BY MOUTH DAILY.   SYNTHROID 125 MCG tablet Generic drug:  levothyroxine TAKE 1 TABLET (125 MCG TOTAL) BY MOUTH DAILY BEFORE BREAKFAST.   traMADol 50 MG tablet Commonly known as:  ULTRAM Take one to two tablets every 6 hours prn pain   VICTOZA 18 MG/3ML Sopn Generic drug:  liraglutide INJECT 0.2 MLS (1.2 MG TOTAL) INTO THE SKIN DAILY. INJECT ONCE DAILY AT THE SAME TIME   XOPENEX HFA 45 MCG/ACT inhaler Generic drug:  levalbuterol Inhale 1-2 puffs into the lungs 4 (four) times daily as needed for shortness of breath.       Allergies:  Allergies  Allergen Reactions  . Oxycodone-Acetaminophen     REACTION: GI upset  . Propoxyphene N-Acetaminophen     REACTION: nausea, rapid heartrate    Past Medical History:  Diagnosis Date  . Anxiety   . Asthma     . Clotting disorder ([place])    DVT left leg between ankle and knee due to tennis injury  . COPD (chronic obstructive pulmonary disease) ([place])   . Depression   . Diabetes mellitus   . DVT (deep venous thrombosis) ([facility])    left leg  . Fibromyalgia   . GERD (gastroesophageal reflux disease)   . Heart murmur   . Hyperlipidemia   . Hypertension   . Hypothyroidism   . Lupus   . Neuromuscular disorder ([facility])    fibromyalgia  . Pancreatitis   . Pneumonia     Past Surgical History:  Procedure Laterality Date  . APPENDECTOMY    . bladder tack    . CESAREAN SECTION    . CHOLECYSTECTOMY    . COLONOSCOPY     12 yrs ago  . LEFT HEART CATH AND CORONARY ANGIOGRAPHY N/A 11/22/2016   Procedure: Left Heart Cath and Coronary Angiography;  Surgeon: Burnell Blanks, MD;  Location: [facility];  Service: Cardiovascular;  Laterality: N/A;  . SEPTOPLASTY      Family History  Problem Relation Age of Onset  . Adopted: Yes  . Diabetes Mellitus II Mother   . Colon polyps Mother   . Peptic Ulcer Mother   . CAD Father   . Diabetes Mellitus II Father   . CAD Sister   . Diabetes Mellitus II Sister   . CAD Brother   . Diabetes Mellitus II Brother   . Breast cancer Maternal Grandmother   . Stomach cancer Maternal Grandmother   . Colon cancer Maternal Grandmother   . Cirrhosis  Cousin        x 2  . Esophageal cancer Neg Hx   . Rectal cancer Neg Hx     Social History:  reports that she has never smoked. She has never used smokeless tobacco. She reports that she drinks alcohol. She reports that she does not use drugs.   Review of Systems  Psychiatric/Behavioral: Positive for depressed mood.   She is having severe back pain and maybe needing surgery   Lipid history: She has been on Crestor for hyperlipidemia. Triglycerides Were higher in 2017    Lab Results  Component Value Date   CHOL 266 (H) 08/03/2016   HDL 48.10 08/03/2016   LDLCALC 109 (H) 10/07/2014   LDLDIRECT 208.0  08/03/2016   TRIG 221.0 (H) 08/03/2016   CHOLHDL 6 08/03/2016           Hypertension:   She is currently on low-dose 50 mg losartan With fair control  Most recent eye exam was 5/17   Most recent foot exam:10/17  HYPOTHYROIDISM: She thinks she has been hypothyroid since age of 28 and at that time she was having fatigue and lethargy. Her dosage was increased in 5/17, now taking 125 g  TSH is ok   Lab Results  Component Value Date   TSH 0.50 02/23/2017   TSH 2.90 07/05/2016   TSH 7.42 (H) 02/15/2016   FREET4 1.33 10/01/2009    Depression: She has been continued on Cymbalta, followed by PCP  She thinks she may have some vaginal yeast infection lately with some discomfort and discharge   Physical Examination:  BP (!) 144/76   Pulse (!) 105   Ht 5\' 3"  (1.6 m)   Wt 195 lb 12.8 oz (88.8 kg)   SpO2 97%   BMI 34.68 kg/m      ASSESSMENT:  Diabetes type 2, uncontrolled   See history of present illness for detailed discussion of current diabetes management, blood sugar patterns and problems identified  Her blood sugar control is much worse now with glucose readings over 300 in the mornings on an average at least Not clear why results and she is taking more basal insulin than before and also starting some NovoLog Blood sugars had been 400 also earlier this month  Most likely her blood sugars are higher from the severe back pain she is having She is only taking 0.6 dose and is not able to control her eating during the night She does not tolerate higher doses because nausea and discussed that Trulicity probably would have the same effects  Good candidate for medication like Invokana Discussed action of SGLT 2 drugs on lowering glucose by decreasing kidney absorption of glucose, benefits of weight loss and lower blood pressure, possible side effects including candidiasis and dosage regimen     PLAN for diabetes:    Start taking 10 mg Jardiance daily, she refuses to  take Invokana  Stop Levemir and continue TRESIBA U-200  Start taking 84 units of [place]  Given her a flowsheet with instructions to adjust to [place] every 3 days by 4 units to get her morning sugars at least under 130  Increase NovoLog to 20 units and at additional doses for high readings before meals  Start checking blood sugars 4 times a day  Diflucan 150 mg now and as needed for yeast infection  May stop Victoza for now  Consider Ozempic if covered She needs to eat only low fat snacks late at night with some protein and avoid  sweets and drinks with sugar consistently She will call if blood sugars are not well-controlled and another couple of weeks and follow-up in 1 month Reminded her to keep her appointments regularly  Hypertension: She is on HCTZ and her blood pressure is getting lower may be able to stop this with starting Jardiance   Patient Instructions  Stop Levemir  Tresiba 84 units once daily  Novolog 20 units before meals  Stop Victoza      Counseling time on subjects discussed above is over 50% of today's 25 minute visit    Keiley Levey 05/10/2017, 11:25 AM   Note: This office note was prepared with Estate agent. Any transcriptional errors that result from this process are unintentional.

## 2017-05-09 NOTE — Telephone Encounter (Signed)
LM for pt to call back to schedule °

## 2017-05-10 ENCOUNTER — Encounter: Payer: Self-pay | Admitting: Endocrinology

## 2017-05-10 ENCOUNTER — Ambulatory Visit (INDEPENDENT_AMBULATORY_CARE_PROVIDER_SITE_OTHER): Payer: Medicare Other | Admitting: Endocrinology

## 2017-05-10 VITALS — BP 144/76 | HR 105 | Ht 63.0 in | Wt 195.8 lb

## 2017-05-10 DIAGNOSIS — Z794 Long term (current) use of insulin: Secondary | ICD-10-CM

## 2017-05-10 DIAGNOSIS — E1165 Type 2 diabetes mellitus with hyperglycemia: Secondary | ICD-10-CM | POA: Diagnosis not present

## 2017-05-10 DIAGNOSIS — I10 Essential (primary) hypertension: Secondary | ICD-10-CM | POA: Diagnosis not present

## 2017-05-10 MED ORDER — FLUCONAZOLE 150 MG PO TABS: 150.0000 mg | ORAL_TABLET | Freq: Once | ORAL | 3 refills | Status: AC

## 2017-05-10 MED ORDER — EMPAGLIFLOZIN 10 MG PO TABS: 10.0000 mg | ORAL_TABLET | Freq: Every day | ORAL | 3 refills | Status: DC

## 2017-05-10 NOTE — Patient Instructions (Addendum)
Stop Levemir  Tresiba 84 units once daily  Novolog 20 units before meals  Stop Victoza  Check blood sugars on waking up  daily  Also check blood sugars about 2 hours after a meal and do this after different meals by rotation  Recommended blood sugar levels on waking up is 90-130 and about 2 hours after meal is 130-160  Please bring your blood sugar monitor to each visit, thank you

## 2017-05-11 ENCOUNTER — Ambulatory Visit (INDEPENDENT_AMBULATORY_CARE_PROVIDER_SITE_OTHER): Payer: Medicare Other | Admitting: Family Medicine

## 2017-05-11 ENCOUNTER — Encounter: Payer: Self-pay | Admitting: Family Medicine

## 2017-05-11 VITALS — BP 110/74 | HR 103 | Temp 97.9°F | Wt 197.9 lb

## 2017-05-11 DIAGNOSIS — Z780 Asymptomatic menopausal state: Secondary | ICD-10-CM | POA: Diagnosis not present

## 2017-05-11 DIAGNOSIS — B3731 Acute candidiasis of vulva and vagina: Secondary | ICD-10-CM

## 2017-05-11 DIAGNOSIS — I10 Essential (primary) hypertension: Secondary | ICD-10-CM

## 2017-05-11 DIAGNOSIS — M5416 Radiculopathy, lumbar region: Secondary | ICD-10-CM

## 2017-05-11 DIAGNOSIS — B373 Candidiasis of vulva and vagina: Secondary | ICD-10-CM | POA: Diagnosis not present

## 2017-05-11 MED ORDER — FLUCONAZOLE 100 MG PO TABS: 100.0000 mg | ORAL_TABLET | Freq: Every day | ORAL | 0 refills | Status: DC

## 2017-05-11 NOTE — Patient Instructions (Signed)
STOP the Estrace.

## 2017-05-11 NOTE — Progress Notes (Signed)
Subjective:     Patient ID: Yesenia Wilson, female   DOB: 29-Oct-1955, 61 y.o.   MRN: [id number]  HPI Patient seen for hospital follow-up. She was seen at [facility] and admitted on the 17th with severe low back pain and discharged on the 20th. She had very high blood pressure on admission along with blood glucose reportedly over 600. We have no full records at this time. She was seen in consultation by neurosurgeon and has follow-up there soon. She apparently had MRI scan we have no results. She thinks that she is looking at surgery within the next couple weeks. She has left lumbar radiculitis. No weakness.  Patient reports that she had some mild confusion at time of admission. She states had CT of head was done which was unremarkable. Her blood sugars are stable this time. Recent fasting sugars are around 200. She was recently taken off Victoza and placed on Jardiance but did not have insurance coverage. She still waiting to sort that out with endocrinology  She's had recent concerns for yeast vaginitis. We prescribed one fluconazole last week but she still has some symptoms of itching and whitish discharge. She's had frequent yeast infections in the past. Denies recent antibiotic.  She continues has significant low back pain. She is on combination therapy per neurosurgeon with fentanyl patch and oxycodone 5 mg every 6 hours. No longer takes tramadol.  Patient is postmenopausal. She's had hot flashes in the past but not recently. She is still on Estrace 0.5 mg. She was placed on this by gynecologist years ago but no longer sees them. She's been much less active recently secondary to back pain. She states she's had remote history of DVT.  Past Medical History:  Diagnosis Date  . Anxiety   . Asthma   . Clotting disorder ([place])    DVT left leg between ankle and knee due to tennis injury  . COPD (chronic obstructive pulmonary disease) ([place])   . Depression   . Diabetes mellitus   . DVT (deep  venous thrombosis) ([facility])    left leg  . Fibromyalgia   . GERD (gastroesophageal reflux disease)   . Heart murmur   . Hyperlipidemia   . Hypertension   . Hypothyroidism   . Lupus   . Neuromuscular disorder ([facility])    fibromyalgia  . Pancreatitis   . Pneumonia    Past Surgical History:  Procedure Laterality Date  . APPENDECTOMY    . bladder tack    . CESAREAN SECTION    . CHOLECYSTECTOMY    . COLONOSCOPY     12 yrs ago  . LEFT HEART CATH AND CORONARY ANGIOGRAPHY N/A 11/22/2016   Procedure: Left Heart Cath and Coronary Angiography;  Surgeon: Burnell Blanks, MD;  Location: [facility];  Service: Cardiovascular;  Laterality: N/A;  . SEPTOPLASTY      reports that she has never smoked. She has never used smokeless tobacco. She reports that she drinks alcohol. She reports that she does not use drugs. family history includes Breast cancer in her maternal grandmother; CAD in her brother, father, and sister; Cirrhosis in her cousin; Colon cancer in her maternal grandmother; Colon polyps in her mother; Diabetes Mellitus II in her brother, father, mother, and sister; Peptic Ulcer in her mother; Stomach cancer in her maternal grandmother. She was adopted. Allergies  Allergen Reactions  . Oxycodone-Acetaminophen     REACTION: GI upset  . Propoxyphene N-Acetaminophen     REACTION: nausea, rapid heartrate  Review of Systems  Constitutional: Positive for fatigue. Negative for chills and fever.  Respiratory: Negative for shortness of breath.   Cardiovascular: Negative for chest pain.  Gastrointestinal: Negative for abdominal pain.  Endocrine: Negative for polydipsia and polyuria.  Musculoskeletal: Positive for back pain.       Objective:   Physical Exam  Constitutional: She appears well-developed and well-nourished.  Cardiovascular: Normal rate and regular rhythm.   Pulmonary/Chest: Effort normal and breath sounds normal. No respiratory distress. She has no wheezes. She  has no rales.  Musculoskeletal: She exhibits no edema.  Straight leg brace or negative bilaterally  Neurological:  She has full-strength with plantar flexion, dorsiflexion, and knee extension.       Assessment:     #1 hypertension currently stable and at goal  #2 recent left lumbar back pain with left lumbar radiculitis symptoms followed by neurosurgeon  #3 probable yeast vaginitis  #4 postmenopausal. Patient has been on estrogen and we recommended she try to come off of this now- especially with her inactivity and increased risk for clotting    Plan:     -Discontinue Estrace -Fluconazole 100 mg once daily for 5 days -Continue close follow-up with neurosurgery -She will continue close follow-up with endocrinology regarding her poorly controlled type 2 diabetes. She may not be a great candidate for SGLT-2 class because of her history of frequent yeast vaginitis the past  Eulas Post MD [facility] at [facility]

## 2017-05-14 DIAGNOSIS — M5126 Other intervertebral disc displacement, lumbar region: Secondary | ICD-10-CM | POA: Diagnosis not present

## 2017-05-15 ENCOUNTER — Other Ambulatory Visit: Payer: Self-pay

## 2017-05-15 ENCOUNTER — Telehealth: Payer: Self-pay | Admitting: Endocrinology

## 2017-05-15 MED ORDER — CANAGLIFLOZIN 100 MG PO TABS: 100.0000 mg | ORAL_TABLET | Freq: Every day | ORAL | 2 refills | Status: DC

## 2017-05-15 NOTE — Telephone Encounter (Signed)
Also patient stated that she is not taking the [place] and the Levemir together.

## 2017-05-15 NOTE — Telephone Encounter (Signed)
Routing to you °

## 2017-05-15 NOTE — Telephone Encounter (Signed)
Called patient and she agreed to try the [place]. I am sending in a prescription for Invokana 100 mg per Dr[facility] [place] [facility]

## 2017-05-15 NOTE — Telephone Encounter (Signed)
Please advise 

## 2017-05-15 NOTE — Telephone Encounter (Signed)
Patient called in reference to empagliflozin (JARDIANCE) 10 MG TABS tablet. Pharmacy states insurance will not pay for this Rx unless Dr. Dwyane Dee can get it approved. Please call pharmacy and advise.  [id number] [place], [place] - [place] [phone] (Phone) [phone] (Fax)   Patient stated she is taking Levemir until Vania Rea gets approved.

## 2017-05-15 NOTE — Telephone Encounter (Signed)
We cannot get Jardiance approved unless she tries Invokana 100 mg daily and I think this is perfectly safe for her.  She does not need to take Levemir and [place] together as discussed.  Please send Invokana if she agrees

## 2017-05-18 ENCOUNTER — Encounter: Payer: Self-pay | Admitting: Family Medicine

## 2017-05-18 ENCOUNTER — Ambulatory Visit (INDEPENDENT_AMBULATORY_CARE_PROVIDER_SITE_OTHER): Payer: Medicare Other | Admitting: Family Medicine

## 2017-05-18 VITALS — BP 136/80 | HR 81 | Temp 98.3°F | Ht 63.0 in | Wt 203.1 lb

## 2017-05-18 DIAGNOSIS — H60502 Unspecified acute noninfective otitis externa, left ear: Secondary | ICD-10-CM | POA: Diagnosis not present

## 2017-05-18 MED ORDER — CIPROFLOXACIN-HYDROCORTISONE 0.2-1 % OT SUSP: 3.0000 [drp] | Freq: Two times a day (BID) | OTIC | 0 refills | Status: DC

## 2017-05-18 NOTE — Progress Notes (Signed)
Subjective:     Patient ID: Yesenia Wilson, female   DOB: [birth year], 61 y.o.   MRN: [id number]  HPI Patient seen with left irritation past couple days. She had similar irritation back in May which eventually cleared. She's not seen drainage. No hearing loss. She has some pain. No fevers or chills. She has poorly controlled type 2 diabetes. No recent swimming. Denies any foreign body to left ear canal  Past Medical History:  Diagnosis Date  . Anxiety   . Asthma   . Clotting disorder ([place])    DVT left leg between ankle and knee due to tennis injury  . COPD (chronic obstructive pulmonary disease) ([place])   . Depression   . Diabetes mellitus   . DVT (deep venous thrombosis) ([facility])    left leg  . Fibromyalgia   . GERD (gastroesophageal reflux disease)   . Heart murmur   . Hyperlipidemia   . Hypertension   . Hypothyroidism   . Lupus   . Neuromuscular disorder ([facility])    fibromyalgia  . Pancreatitis   . Pneumonia    Past Surgical History:  Procedure Laterality Date  . APPENDECTOMY    . bladder tack    . CESAREAN SECTION    . CHOLECYSTECTOMY    . COLONOSCOPY     12 yrs ago  . LEFT HEART CATH AND CORONARY ANGIOGRAPHY N/A 11/22/2016   Procedure: Left Heart Cath and Coronary Angiography;  Surgeon: Burnell Blanks, MD;  Location: [facility];  Service: Cardiovascular;  Laterality: N/A;  . SEPTOPLASTY      reports that she has never smoked. She has never used smokeless tobacco. She reports that she drinks alcohol. She reports that she does not use drugs. family history includes Breast cancer in her maternal grandmother; CAD in her brother, father, and sister; Cirrhosis in her cousin; Colon cancer in her maternal grandmother; Colon polyps in her mother; Diabetes Mellitus II in her brother, father, mother, and sister; Peptic Ulcer in her mother; Stomach cancer in her maternal grandmother. She was adopted. Allergies  Allergen Reactions  . Oxycodone-Acetaminophen     REACTION: GI  upset  . Propoxyphene N-Acetaminophen     REACTION: nausea, rapid heartrate     Review of Systems  Constitutional: Negative for chills and fever.  HENT: Positive for ear pain. Negative for ear discharge and hearing loss.        Objective:   Physical Exam  Constitutional: She appears well-developed and well-nourished.  HENT:  Right ear canal is clear. Left ear canal is full of whitish substance. She has some mild swelling of the external canal. We used a curette and were able to remove some of the debris from the canal and then used suction with Gomco to remove remaining debris.       Assessment:     Left otitis externa. She had some debris in the canal which appear be mostly cerumen but possibly some exudative type debris which was removed by suction without difficulty    Plan:     -Cipro HC drops 3 drops left ear twice a day and reassess in one week. -Keep ear dry otherwise  Eulas Post MD [facility]

## 2017-05-22 ENCOUNTER — Telehealth: Payer: Self-pay | Admitting: Family Medicine

## 2017-05-22 NOTE — Telephone Encounter (Signed)
We need to have her find out which alternative they will cover.

## 2017-05-22 NOTE — Telephone Encounter (Signed)
Pt stated that the medication that was called in on Friday her insurance will not pay for it and would like to see if something different can be called in.  [facility] [place]

## 2017-05-23 NOTE — Telephone Encounter (Signed)
Pt states she has not heard from anyone concerning what she needs to do for another Rx to be sent in for her.  The Ciprofloxacin-Hydrocortisone   Is over $350.   Advised pt to call her insurance company and see what they will cover.  Pt was not happy about this, stating she is supposed to return on Friday and not had an abx the entire time. Pt will call back with this information.

## 2017-05-23 NOTE — Telephone Encounter (Signed)
Let's use the Ofloxacin drops.

## 2017-05-23 NOTE — Telephone Encounter (Signed)
Rx called in to pharmacy. 

## 2017-05-23 NOTE — Telephone Encounter (Signed)
Spoke with pharmacist and she said other options are: 1.  Ofloxacin eye drops which can be use in the ear. 2.  Cortisporin drops.  Which do you perfer?

## 2017-05-24 ENCOUNTER — Telehealth: Payer: Self-pay | Admitting: Endocrinology

## 2017-05-24 DIAGNOSIS — M5126 Other intervertebral disc displacement, lumbar region: Secondary | ICD-10-CM | POA: Diagnosis not present

## 2017-05-24 NOTE — Telephone Encounter (Signed)
I see in the last OV note from 05/10/2017 you have a note to stop the Victoza. When you can, please advise if okay to refill or if need to refuse.

## 2017-05-25 ENCOUNTER — Other Ambulatory Visit: Payer: Self-pay | Admitting: Family Medicine

## 2017-05-25 ENCOUNTER — Other Ambulatory Visit: Payer: Self-pay

## 2017-05-25 ENCOUNTER — Ambulatory Visit (INDEPENDENT_AMBULATORY_CARE_PROVIDER_SITE_OTHER): Payer: Medicare Other | Admitting: Family Medicine

## 2017-05-25 ENCOUNTER — Encounter: Payer: Self-pay | Admitting: Family Medicine

## 2017-05-25 VITALS — BP 132/82 | HR 113 | Temp 98.2°F | Wt 194.1 lb

## 2017-05-25 DIAGNOSIS — H66012 Acute suppurative otitis media with spontaneous rupture of ear drum, left ear: Secondary | ICD-10-CM

## 2017-05-25 MED ORDER — AMOXICILLIN-POT CLAVULANATE 875-125 MG PO TABS: 1.0000 | ORAL_TABLET | Freq: Two times a day (BID) | ORAL | 0 refills | Status: DC

## 2017-05-25 MED ORDER — CANAGLIFLOZIN 100 MG PO TABS: 100.0000 mg | ORAL_TABLET | Freq: Every day | ORAL | 2 refills | Status: DC

## 2017-05-25 NOTE — Progress Notes (Signed)
Subjective:     Patient ID: Yesenia Wilson, female   DOB: May 29, 1956, 61 y.o.   MRN: [id number]  HPI Patient seen to follow-up left ear. She was seen here last week and removed some exudative type debris from the canal by suction. She today has some slight liquid drainage and pain is actually improving though. Her hearing is somewhat muffled. No vertigo. No fevers or chills.  She has up coming back surgery scheduled for 9/13  Past Medical History:  Diagnosis Date  . Anxiety   . Asthma   . Clotting disorder ([place])    DVT left leg between ankle and knee due to tennis injury  . COPD (chronic obstructive pulmonary disease) ([place])   . Depression   . Diabetes mellitus   . DVT (deep venous thrombosis) ([facility])    left leg  . Fibromyalgia   . GERD (gastroesophageal reflux disease)   . Heart murmur   . Hyperlipidemia   . Hypertension   . Hypothyroidism   . Lupus   . Neuromuscular disorder ([facility])    fibromyalgia  . Pancreatitis   . Pneumonia    Past Surgical History:  Procedure Laterality Date  . APPENDECTOMY    . bladder tack    . CESAREAN SECTION    . CHOLECYSTECTOMY    . COLONOSCOPY     12 yrs ago  . LEFT HEART CATH AND CORONARY ANGIOGRAPHY N/A 11/22/2016   Procedure: Left Heart Cath and Coronary Angiography;  Surgeon: Burnell Blanks, MD;  Location: [facility];  Service: Cardiovascular;  Laterality: N/A;  . SEPTOPLASTY      reports that she has never smoked. She has never used smokeless tobacco. She reports that she drinks alcohol. She reports that she does not use drugs. family history includes Breast cancer in her maternal grandmother; CAD in her brother, father, and sister; Cirrhosis in her cousin; Colon cancer in her maternal grandmother; Colon polyps in her mother; Diabetes Mellitus II in her brother, father, mother, and sister; Peptic Ulcer in her mother; Stomach cancer in her maternal grandmother. She was adopted. Allergies  Allergen Reactions  .  Oxycodone-Acetaminophen     REACTION: GI upset  . Propoxyphene N-Acetaminophen     REACTION: nausea, rapid heartrate     Review of Systems  Constitutional: Negative for chills and fever.       Objective:   Physical Exam  Constitutional: She appears well-developed and well-nourished.  HENT:  Right eardrum is normal in appearance. Her left canal is full of some purulent secretions and most of the eardrum is not visualized but a very small portion is visualized and is erythematous.       Assessment:     Recent otitis externa and evidence for supperative otitis media probably with small perforation    Plan:     -Continue with Floxin drops -Keep water out of ear -Augmentin 825 mg twice daily with food for 10 days  Eulas Post MD [facility] at [facility]

## 2017-05-25 NOTE — Patient Instructions (Signed)

## 2017-05-27 NOTE — Telephone Encounter (Signed)
Please confirm that she is taking [place] or [place] and she needs to find out if Ozempic is covered.  We are stopping Victoza

## 2017-05-28 ENCOUNTER — Other Ambulatory Visit: Payer: Self-pay

## 2017-05-28 MED ORDER — PANTOPRAZOLE SODIUM 40 MG PO TBEC
40.0000 mg | DELAYED_RELEASE_TABLET | Freq: Two times a day (BID) | ORAL | 1 refills | Status: DC
Start: 2017-05-28 — End: 2017-12-15

## 2017-05-28 NOTE — Telephone Encounter (Signed)
Wilder Glade is covered by insurance.  Please advise,  Ty,  -LL

## 2017-05-28 NOTE — Telephone Encounter (Signed)
Called patient and she stated that she is not taking the Victoza. She stated that the Jardiance is not covered. She has not tried the [place]. She does not want the Invokana and stated is not taking this but she stated that her blood sugars are doing well. I gave her the names of [place] and she is going to check with her insurance to see if this is covered and let us know.

## 2017-05-29 ENCOUNTER — Telehealth: Payer: Self-pay | Admitting: Endocrinology

## 2017-05-29 DIAGNOSIS — R918 Other nonspecific abnormal finding of lung field: Secondary | ICD-10-CM | POA: Diagnosis not present

## 2017-05-29 NOTE — Telephone Encounter (Signed)
Need to find out if Ozempic is covered, this will help with her weight

## 2017-05-29 NOTE — Telephone Encounter (Signed)
Patient called in reference to [facility] insulin pen. Patient was told to let Dr. Dwyane Dee know that this was covered by her insurance. Needs to be sent to  [id number] - [place], [place] [phone] (Phone) [phone] (Fax)

## 2017-05-29 NOTE — Telephone Encounter (Signed)
Called patient and left a voice message to let her know to check with her insurance company to see if Ozempic is covered per Dr. Dwyane Dee and to please call us back to let us know.

## 2017-05-30 ENCOUNTER — Telehealth: Payer: Self-pay | Admitting: Family Medicine

## 2017-05-30 MED ORDER — SEMAGLUTIDE(0.25 OR 0.5MG/DOS) 2 MG/1.5ML ~~LOC~~ SOPN: 0.5000 mg | PEN_INJECTOR | SUBCUTANEOUS | 1 refills | Status: DC

## 2017-05-30 NOTE — Telephone Encounter (Signed)
Spoke with patient. She states that her preop labs revealed "elevated white blood count ". She's not sure how high. In any event, she is feeling better. Her ear pain has resolved and no drainage. She has follow-up with [place] Monday and is requesting that we check CBC then to make sure her white count is back to normal.

## 2017-05-30 NOTE — Telephone Encounter (Signed)
She will inject 0.25 mg weekly for 2 injections and then 0.5 mg weekly.  She will get one pen with 1 refill

## 2017-05-30 NOTE — Addendum Note (Signed)
Addended by: Marian Sorrow on: 05/30/2017 02:24 PM   Modules accepted: Orders

## 2017-05-30 NOTE — Telephone Encounter (Signed)
Spoke to pt, told her Rx for new insulin pen Ozempiz sent to pharmacy. Pt verbalized understanding.

## 2017-05-30 NOTE — Telephone Encounter (Signed)
Dr. Dwyane Dee, pt called back and said Ozempiz insulin pen is covered. Please advise directions.

## 2017-05-30 NOTE — Telephone Encounter (Signed)
Pt is requesting a call back to discuss why she could not have surgery tomorrow. Pt has an appt on Monday, but still wants you to call her.

## 2017-06-01 ENCOUNTER — Other Ambulatory Visit: Payer: Self-pay | Admitting: Family Medicine

## 2017-06-04 ENCOUNTER — Ambulatory Visit (INDEPENDENT_AMBULATORY_CARE_PROVIDER_SITE_OTHER): Payer: Medicare Other | Admitting: Family Medicine

## 2017-06-04 ENCOUNTER — Encounter: Payer: Self-pay | Admitting: Family Medicine

## 2017-06-04 VITALS — BP 150/100 | HR 113 | Temp 98.3°F | Wt 196.3 lb

## 2017-06-04 DIAGNOSIS — D72829 Elevated white blood cell count, unspecified: Secondary | ICD-10-CM | POA: Diagnosis not present

## 2017-06-04 DIAGNOSIS — H6092 Unspecified otitis externa, left ear: Secondary | ICD-10-CM

## 2017-06-04 MED ORDER — LEVOFLOXACIN 500 MG PO TABS: 500.0000 mg | ORAL_TABLET | Freq: Every day | ORAL | 0 refills | Status: DC

## 2017-06-04 NOTE — Patient Instructions (Signed)
Start the Levaquin one daily We will set up ENT referral Keep water out of ear.

## 2017-06-04 NOTE — Progress Notes (Signed)
Subjective:     Patient ID: Yesenia Wilson, female   DOB: 06-09-1956, 61 y.o.   MRN: [id number]  HPI Patient is been battling with left ear infection. She's been taking Floxin drops along with recent Augmentin. She still has some drainage. Overall ear feels better with no pain. No fevers or chills. She was preparing to have low back surgery and when she went in for preop labs are white blood count was over 13,000 and they delayed her surgery.  In looking back over time she's actually had several white count slightly elevated between 11 and 14,000 range. She denies any recent fever.  Past Medical History:  Diagnosis Date  . Anxiety   . Asthma   . Clotting disorder ([place])    DVT left leg between ankle and knee due to tennis injury  . COPD (chronic obstructive pulmonary disease) ([place])   . Depression   . Diabetes mellitus   . DVT (deep venous thrombosis) ([facility])    left leg  . Fibromyalgia   . GERD (gastroesophageal reflux disease)   . Heart murmur   . Hyperlipidemia   . Hypertension   . Hypothyroidism   . Lupus   . Neuromuscular disorder ([facility])    fibromyalgia  . Pancreatitis   . Pneumonia    Past Surgical History:  Procedure Laterality Date  . APPENDECTOMY    . bladder tack    . CESAREAN SECTION    . CHOLECYSTECTOMY    . COLONOSCOPY     12 yrs ago  . LEFT HEART CATH AND CORONARY ANGIOGRAPHY N/A 11/22/2016   Procedure: Left Heart Cath and Coronary Angiography;  Surgeon: Burnell Blanks, MD;  Location: [facility];  Service: Cardiovascular;  Laterality: N/A;  . SEPTOPLASTY      reports that she has never smoked. She has never used smokeless tobacco. She reports that she drinks alcohol. She reports that she does not use drugs. family history includes Breast cancer in her maternal grandmother; CAD in her brother, father, and sister; Cirrhosis in her cousin; Colon cancer in her maternal grandmother; Colon polyps in her mother; Diabetes Mellitus II in her brother, father,  mother, and sister; Peptic Ulcer in her mother; Stomach cancer in her maternal grandmother. She was adopted. Allergies  Allergen Reactions  . Oxycodone-Acetaminophen     REACTION: GI upset  . Propoxyphene N-Acetaminophen     REACTION: nausea, rapid heartrate     Review of Systems  Constitutional: Negative for chills and fever.       Objective:   Physical Exam  Constitutional: She appears well-developed and well-nourished.  HENT:  Right canal and eardrum are normal. Left canal is full of purulent debris. She also has some blackened question spores in the canal. Eardrum is nonvisualized  Cardiovascular: Normal rate and regular rhythm.        Assessment:     Persistent left otitis externa in type 2 diabetic. No pain but exudate not clearing with Floxin drops along with oral antibiotic    Plan:     -We were able to suction out some additional debris today in office -Continue with quinolone topical drops and start Levaquin 500 milligrams once daily -Set up ENT referral -Continue to keep water out of the ear  Eulas Post MD [facility] at [facility]

## 2017-06-06 ENCOUNTER — Telehealth: Payer: Self-pay | Admitting: Family Medicine

## 2017-06-06 ENCOUNTER — Other Ambulatory Visit: Payer: Medicare Other

## 2017-06-06 DIAGNOSIS — D72829 Elevated white blood cell count, unspecified: Secondary | ICD-10-CM

## 2017-06-06 DIAGNOSIS — H6123 Impacted cerumen, bilateral: Secondary | ICD-10-CM | POA: Diagnosis not present

## 2017-06-06 DIAGNOSIS — H60333 Swimmer's ear, bilateral: Secondary | ICD-10-CM | POA: Diagnosis not present

## 2017-06-06 LAB — CBC WITH DIFFERENTIAL/PLATELET
Basophils Absolute: 0.1 10*3/uL (ref 0.0–0.1)
Basophils Relative: 0.6 % (ref 0.0–3.0)
EOS ABS: 0.6 10*3/uL (ref 0.0–0.7)
EOS PCT: 5.2 % — AB (ref 0.0–5.0)
HCT: 42.5 % (ref 36.0–46.0)
HEMOGLOBIN: 13.7 g/dL (ref 12.0–15.0)
Lymphocytes Relative: 28.4 % (ref 12.0–46.0)
Lymphs Abs: 3.3 10*3/uL (ref 0.7–4.0)
MCHC: 32.3 g/dL (ref 30.0–36.0)
MCV: 86.4 fl (ref 78.0–100.0)
MONO ABS: 0.6 10*3/uL (ref 0.1–1.0)
Monocytes Relative: 5.5 % (ref 3.0–12.0)
Neutro Abs: 7 10*3/uL (ref 1.4–7.7)
Neutrophils Relative %: 60.3 % (ref 43.0–77.0)
Platelets: 348 10*3/uL (ref 150.0–400.0)
RBC: 4.91 Mil/uL (ref 3.87–5.11)
RDW: 14.4 % (ref 11.5–15.5)
WBC: 11.6 10*3/uL — AB (ref 4.0–10.5)

## 2017-06-06 NOTE — Addendum Note (Signed)
Addended by: Netta Neat D on: 06/06/2017 03:08 PM   Modules accepted: Orders

## 2017-06-06 NOTE — Telephone Encounter (Signed)
Spoke with patient and order and lab appointment made

## 2017-06-06 NOTE — Telephone Encounter (Signed)
Pt would like a return call back from rachel. Pt saw dr Elease Hashimoto on 06-04-17. Pt would like to discuss getting labs that another md would like her to have

## 2017-06-11 DIAGNOSIS — M797 Fibromyalgia: Secondary | ICD-10-CM | POA: Diagnosis present

## 2017-06-11 DIAGNOSIS — E785 Hyperlipidemia, unspecified: Secondary | ICD-10-CM | POA: Diagnosis present

## 2017-06-11 DIAGNOSIS — M5126 Other intervertebral disc displacement, lumbar region: Secondary | ICD-10-CM | POA: Diagnosis not present

## 2017-06-11 DIAGNOSIS — M5127 Other intervertebral disc displacement, lumbosacral region: Secondary | ICD-10-CM | POA: Diagnosis not present

## 2017-06-11 DIAGNOSIS — E1165 Type 2 diabetes mellitus with hyperglycemia: Secondary | ICD-10-CM | POA: Diagnosis present

## 2017-06-11 DIAGNOSIS — Z79899 Other long term (current) drug therapy: Secondary | ICD-10-CM | POA: Diagnosis not present

## 2017-06-11 DIAGNOSIS — Z7982 Long term (current) use of aspirin: Secondary | ICD-10-CM | POA: Diagnosis not present

## 2017-06-11 DIAGNOSIS — Z818 Family history of other mental and behavioral disorders: Secondary | ICD-10-CM | POA: Diagnosis not present

## 2017-06-11 DIAGNOSIS — E871 Hypo-osmolality and hyponatremia: Secondary | ICD-10-CM | POA: Diagnosis not present

## 2017-06-11 DIAGNOSIS — M4326 Fusion of spine, lumbar region: Secondary | ICD-10-CM | POA: Diagnosis not present

## 2017-06-11 DIAGNOSIS — Z79891 Long term (current) use of opiate analgesic: Secondary | ICD-10-CM | POA: Diagnosis not present

## 2017-06-11 DIAGNOSIS — F329 Major depressive disorder, single episode, unspecified: Secondary | ICD-10-CM | POA: Diagnosis present

## 2017-06-11 DIAGNOSIS — J449 Chronic obstructive pulmonary disease, unspecified: Secondary | ICD-10-CM | POA: Diagnosis present

## 2017-06-11 DIAGNOSIS — F419 Anxiety disorder, unspecified: Secondary | ICD-10-CM | POA: Diagnosis present

## 2017-06-11 DIAGNOSIS — M4317 Spondylolisthesis, lumbosacral region: Secondary | ICD-10-CM | POA: Diagnosis present

## 2017-06-11 DIAGNOSIS — Z8249 Family history of ischemic heart disease and other diseases of the circulatory system: Secondary | ICD-10-CM | POA: Diagnosis not present

## 2017-06-11 DIAGNOSIS — Z794 Long term (current) use of insulin: Secondary | ICD-10-CM | POA: Diagnosis not present

## 2017-06-11 DIAGNOSIS — E039 Hypothyroidism, unspecified: Secondary | ICD-10-CM | POA: Diagnosis present

## 2017-06-11 DIAGNOSIS — M47897 Other spondylosis, lumbosacral region: Secondary | ICD-10-CM | POA: Diagnosis present

## 2017-06-16 DIAGNOSIS — Z794 Long term (current) use of insulin: Secondary | ICD-10-CM | POA: Diagnosis not present

## 2017-06-16 DIAGNOSIS — F329 Major depressive disorder, single episode, unspecified: Secondary | ICD-10-CM | POA: Diagnosis not present

## 2017-06-16 DIAGNOSIS — J449 Chronic obstructive pulmonary disease, unspecified: Secondary | ICD-10-CM | POA: Diagnosis not present

## 2017-06-16 DIAGNOSIS — E785 Hyperlipidemia, unspecified: Secondary | ICD-10-CM | POA: Diagnosis not present

## 2017-06-16 DIAGNOSIS — Z4789 Encounter for other orthopedic aftercare: Secondary | ICD-10-CM | POA: Diagnosis not present

## 2017-06-16 DIAGNOSIS — E119 Type 2 diabetes mellitus without complications: Secondary | ICD-10-CM | POA: Diagnosis not present

## 2017-06-16 DIAGNOSIS — Z7982 Long term (current) use of aspirin: Secondary | ICD-10-CM | POA: Diagnosis not present

## 2017-06-16 DIAGNOSIS — F419 Anxiety disorder, unspecified: Secondary | ICD-10-CM | POA: Diagnosis not present

## 2017-06-18 DIAGNOSIS — M5126 Other intervertebral disc displacement, lumbar region: Secondary | ICD-10-CM | POA: Diagnosis not present

## 2017-06-18 DIAGNOSIS — M545 Low back pain: Secondary | ICD-10-CM | POA: Diagnosis not present

## 2017-06-18 DIAGNOSIS — Z981 Arthrodesis status: Secondary | ICD-10-CM | POA: Diagnosis not present

## 2017-06-19 DIAGNOSIS — J449 Chronic obstructive pulmonary disease, unspecified: Secondary | ICD-10-CM | POA: Diagnosis not present

## 2017-06-19 DIAGNOSIS — E785 Hyperlipidemia, unspecified: Secondary | ICD-10-CM | POA: Diagnosis not present

## 2017-06-19 DIAGNOSIS — F419 Anxiety disorder, unspecified: Secondary | ICD-10-CM | POA: Diagnosis not present

## 2017-06-19 DIAGNOSIS — E119 Type 2 diabetes mellitus without complications: Secondary | ICD-10-CM | POA: Diagnosis not present

## 2017-06-19 DIAGNOSIS — F329 Major depressive disorder, single episode, unspecified: Secondary | ICD-10-CM | POA: Diagnosis not present

## 2017-06-19 DIAGNOSIS — Z4789 Encounter for other orthopedic aftercare: Secondary | ICD-10-CM | POA: Diagnosis not present

## 2017-06-20 ENCOUNTER — Ambulatory Visit: Payer: Medicare Other | Admitting: Endocrinology

## 2017-06-22 DIAGNOSIS — F329 Major depressive disorder, single episode, unspecified: Secondary | ICD-10-CM | POA: Diagnosis not present

## 2017-06-22 DIAGNOSIS — Z4789 Encounter for other orthopedic aftercare: Secondary | ICD-10-CM | POA: Diagnosis not present

## 2017-06-22 DIAGNOSIS — F419 Anxiety disorder, unspecified: Secondary | ICD-10-CM | POA: Diagnosis not present

## 2017-06-22 DIAGNOSIS — J449 Chronic obstructive pulmonary disease, unspecified: Secondary | ICD-10-CM | POA: Diagnosis not present

## 2017-06-22 DIAGNOSIS — E119 Type 2 diabetes mellitus without complications: Secondary | ICD-10-CM | POA: Diagnosis not present

## 2017-06-22 DIAGNOSIS — E785 Hyperlipidemia, unspecified: Secondary | ICD-10-CM | POA: Diagnosis not present

## 2017-06-24 ENCOUNTER — Other Ambulatory Visit: Payer: Self-pay | Admitting: Endocrinology

## 2017-06-26 DIAGNOSIS — Z4789 Encounter for other orthopedic aftercare: Secondary | ICD-10-CM | POA: Diagnosis not present

## 2017-06-26 DIAGNOSIS — F329 Major depressive disorder, single episode, unspecified: Secondary | ICD-10-CM | POA: Diagnosis not present

## 2017-06-26 DIAGNOSIS — J449 Chronic obstructive pulmonary disease, unspecified: Secondary | ICD-10-CM | POA: Diagnosis not present

## 2017-06-26 DIAGNOSIS — E119 Type 2 diabetes mellitus without complications: Secondary | ICD-10-CM | POA: Diagnosis not present

## 2017-06-26 DIAGNOSIS — E785 Hyperlipidemia, unspecified: Secondary | ICD-10-CM | POA: Diagnosis not present

## 2017-06-26 DIAGNOSIS — F419 Anxiety disorder, unspecified: Secondary | ICD-10-CM | POA: Diagnosis not present

## 2017-07-02 DIAGNOSIS — Z4789 Encounter for other orthopedic aftercare: Secondary | ICD-10-CM | POA: Diagnosis not present

## 2017-07-02 DIAGNOSIS — F329 Major depressive disorder, single episode, unspecified: Secondary | ICD-10-CM | POA: Diagnosis not present

## 2017-07-02 DIAGNOSIS — F419 Anxiety disorder, unspecified: Secondary | ICD-10-CM | POA: Diagnosis not present

## 2017-07-02 DIAGNOSIS — E785 Hyperlipidemia, unspecified: Secondary | ICD-10-CM | POA: Diagnosis not present

## 2017-07-02 DIAGNOSIS — J449 Chronic obstructive pulmonary disease, unspecified: Secondary | ICD-10-CM | POA: Diagnosis not present

## 2017-07-02 DIAGNOSIS — E119 Type 2 diabetes mellitus without complications: Secondary | ICD-10-CM | POA: Diagnosis not present

## 2017-07-05 ENCOUNTER — Ambulatory Visit (INDEPENDENT_AMBULATORY_CARE_PROVIDER_SITE_OTHER): Payer: Medicare Other | Admitting: Endocrinology

## 2017-07-05 ENCOUNTER — Encounter: Payer: Self-pay | Admitting: Endocrinology

## 2017-07-05 VITALS — BP 144/92 | HR 99 | Ht 63.0 in | Wt 196.8 lb

## 2017-07-05 DIAGNOSIS — E039 Hypothyroidism, unspecified: Secondary | ICD-10-CM

## 2017-07-05 DIAGNOSIS — Z794 Long term (current) use of insulin: Secondary | ICD-10-CM | POA: Diagnosis not present

## 2017-07-05 DIAGNOSIS — J449 Chronic obstructive pulmonary disease, unspecified: Secondary | ICD-10-CM | POA: Diagnosis not present

## 2017-07-05 DIAGNOSIS — F419 Anxiety disorder, unspecified: Secondary | ICD-10-CM | POA: Diagnosis not present

## 2017-07-05 DIAGNOSIS — E785 Hyperlipidemia, unspecified: Secondary | ICD-10-CM | POA: Diagnosis not present

## 2017-07-05 DIAGNOSIS — F329 Major depressive disorder, single episode, unspecified: Secondary | ICD-10-CM | POA: Diagnosis not present

## 2017-07-05 DIAGNOSIS — E119 Type 2 diabetes mellitus without complications: Secondary | ICD-10-CM | POA: Diagnosis not present

## 2017-07-05 DIAGNOSIS — E1165 Type 2 diabetes mellitus with hyperglycemia: Secondary | ICD-10-CM

## 2017-07-05 DIAGNOSIS — Z4789 Encounter for other orthopedic aftercare: Secondary | ICD-10-CM | POA: Diagnosis not present

## 2017-07-05 LAB — BASIC METABOLIC PANEL
BUN: 20 mg/dL (ref 6–23)
CALCIUM: 9.9 mg/dL (ref 8.4–10.5)
CO2: 34 meq/L — AB (ref 19–32)
CREATININE: 1.08 mg/dL (ref 0.40–1.20)
Chloride: 94 mEq/L — ABNORMAL LOW (ref 96–112)
GFR: 54.47 mL/min — ABNORMAL LOW (ref >60.00)
GLUCOSE: 274 mg/dL — AB (ref 70–99)
Potassium: 3.7 mEq/L (ref 3.5–5.1)
Sodium: 138 mEq/L (ref 135–145)

## 2017-07-05 LAB — POCT GLYCOSYLATED HEMOGLOBIN (HGB A1C): HEMOGLOBIN A1C: 10.7

## 2017-07-05 LAB — LIPID PANEL
CHOL/HDL RATIO: 4
Cholesterol: 172 mg/dL (ref 0–200)
HDL: 42.7 mg/dL (ref >39.00)
NONHDL: 129.35
TRIGLYCERIDES: 232 mg/dL — AB (ref 0.0–149.0)
VLDL: 46.4 mg/dL — ABNORMAL HIGH (ref 0.0–40.0)

## 2017-07-05 LAB — TSH: TSH: 7.88 u[IU]/mL — ABNORMAL HIGH (ref 0.35–4.50)

## 2017-07-05 LAB — LDL CHOLESTEROL, DIRECT: Direct LDL: 104 mg/dL

## 2017-07-05 NOTE — Progress Notes (Signed)
Patient ID: Yesenia Wilson, female   DOB: 18-Oct-1955, 61 y.o.   MRN: [id number]            Reason for Appointment: Follow-up for Type 2 Diabetes   History of Present Illness:          Date of diagnosis of type 2 diabetes mellitus: 2002       Background history:   She had been on metformin for several years with fair control Her blood sugars started increasing in 2016 with A1c over 7% Blood sugars were markedly increased with glucose of 440 in 2/17 and she may not have been regular with her metformin at that time.  She was given Amaryl also but subsequently blood sugars were continuing to be high She was tried on Trulicity in April 9629 but apparently this did not help her blood sugars  Recent history:  .  INSULIN regimen is: [place] 50 units daily Novolog Insulin: 15 units before AM meals  Non-insulin hypoglycemic drugs the patient is taking are: Metformin 1 g twice a day, Ozempic 0.5 mg weekly  A1c is higher at 10.7, previously 8.9  Current management, blood sugar patterns and problems identified:  She is not following any of her instructions that are given to her on the last visit including increasing the [place] to 84 units, NovoLog to 20-25 units and using Jardiance  However she did switch her Victoza to [facility] and is now taking 0.5 mg weekly without any side effects  She is seeing better blood sugars, previously fasting readings were averaging 346  However she is not checking blood sugars AFTER meals except a couple of times  She still does not understand the need to cover all of her meals with NovoLog and she takes it only once a day in the morning, she may have been taking this twice a day before  She is still not able to be very active because of continued back pain  She has had problems with controlling her appetite and snacks especially overnight but is only recently starting to feel better feeling of satiety    Side effects from medications have been:  None  Compliance with the medical regimen: Inadequate  Hypoglycemia: None  Glucose monitoring:  done 1 times a day         Glucometer: One Touch.      Blood Glucose readings by time of day and averages from meter download:  FASTING blood sugar range 188-362 with median 272 Nonfasting 264-306 with only 3 reading Overall MEDIAN 272  Previous readings: Mean values apply above for all meters except median for One Touch  PRE-MEAL Fasting Lunch Dinner Bedtime Overall  Glucose range: 240-497  600, 323 319   Mean/median: 346    327   Self-care: The diet that the patient has been following is: tries to limit Carbs.     Typical meal intake: Breakfast is Boiled eggs, tomato.  Lunch is usually [place] sandwich with fruit, sometimes salad.  Evening meal is salad or soup               Dietician visit, most recent: Years ago               Exercise: Unable to do any now because of back pain  Weight history:  Wt Readings from Last 3 Encounters:  07/05/17 196 lb 12.8 oz (89.3 kg)  06/04/17 196 lb 4.8 oz (89 kg)  05/25/17 194 lb 1.6 oz (88 kg)    Glycemic control:  Lab Results  Component Value Date   HGBA1C 10.7 07/05/2017   HGBA1C 8.9 02/23/2017   HGBA1C 10.9 (H) 11/21/2016   Lab Results  Component Value Date   MICROALBUR 3.0 (H) 08/03/2016   LDLCALC 109 (H) 10/07/2014   CREATININE 1.08 07/05/2017   Lab Results  Component Value Date   MICRALBCREAT 2.6 08/03/2016    Other active problems: See review of systems   Allergies as of 07/05/2017      Reactions   Oxycodone-acetaminophen    REACTION: GI upset   Propoxyphene N-acetaminophen    REACTION: nausea, rapid heartrate      Medication List       Accurate as of 07/05/17  9:47 PM. Always use your most recent med list.          aspirin 81 MG tablet Take 81 mg by mouth daily.   BD PEN NEEDLE NANO U/F 32G X 4 MM Misc Generic drug:  Insulin Pen Needle USE 3 PER TO DAY TO INJECT NOVOLOG   DULoxetine 60 MG  capsule Commonly known as:  CYMBALTA TAKE 1 CAPSULE (60 MG TOTAL) BY MOUTH DAILY.   esomeprazole 40 MG capsule Commonly known as:  NEXIUM TAKE ONE CAPSULE BY MOUTH EVERY DAY BEFORE BREAKFAST   glucose blood test strip Commonly known as:  ONETOUCH VERIO Use to test blood sugar 4 times a day   hydrochlorothiazide 12.5 MG tablet Commonly known as:  HYDRODIURIL Take 12.5 mg by mouth daily.   Insulin Degludec 200 UNIT/ML Sopn Commonly known as:  TRESIBA FLEXTOUCH Inject 50 Units into the skin daily.   irbesartan-hydrochlorothiazide 300-12.5 MG tablet Commonly known as:  AVALIDE Take by mouth.   losartan 50 MG tablet Commonly known as:  COZAAR TAKE 1 TABLET (50 MG TOTAL) BY MOUTH DAILY.   metFORMIN 1000 MG tablet Commonly known as:  GLUCOPHAGE TAKE 1 TABLET (1,000 MG TOTAL) BY MOUTH 2 (TWO) TIMES DAILY.   naproxen sodium 220 MG tablet Commonly known as:  ANAPROX Take 220 mg by mouth.   NOVOLOG FLEXPEN 100 UNIT/ML FlexPen Generic drug:  insulin aspart 10 UNITS BEFORE MAIN MEAL DAILY. IF SUGARS >180,TAKE WITH OTHER MEAL(S) ALSO. ADD 15-20U IF NEEDED   ondansetron 4 MG disintegrating tablet Commonly known as:  ZOFRAN ODT Take 1-2 tablets (4-8 mg total) by mouth every 8 (eight) hours as needed for nausea or vomiting.   ONETOUCH DELICA LANCETS FINE Misc Use to check blood sugar 4 times per day. Dx Code E11.9   oxyCODONE-acetaminophen 5-325 MG tablet Commonly known as:  PERCOCET/ROXICET Take by mouth every 6 (six) hours as needed for severe pain.   pantoprazole 40 MG tablet Commonly known as:  PROTONIX Take 1 tablet (40 mg total) by mouth 2 (two) times daily.   rosuvastatin 20 MG tablet Commonly known as:  CRESTOR TAKE 1 TABLET (20 MG TOTAL) BY MOUTH DAILY.   Semaglutide 0.25 or 0.5 MG/DOSE Sopn Commonly known as:  OZEMPIC Inject 0.5 mg into the skin once a week.   SYNTHROID 125 MCG tablet Generic drug:  levothyroxine TAKE 1 TABLET (125 MCG TOTAL) BY MOUTH DAILY  BEFORE BREAKFAST.       Allergies:  Allergies  Allergen Reactions  . Oxycodone-Acetaminophen     REACTION: GI upset  . Propoxyphene N-Acetaminophen     REACTION: nausea, rapid heartrate    Past Medical History:  Diagnosis Date  . Anxiety   . Asthma   . Clotting disorder ([facility])    DVT left leg between ankle  and knee due to tennis injury  . COPD (chronic obstructive pulmonary disease) ([place])   . Depression   . Diabetes mellitus   . DVT (deep venous thrombosis) ([facility])    left leg  . Fibromyalgia   . GERD (gastroesophageal reflux disease)   . Heart murmur   . Hyperlipidemia   . Hypertension   . Hypothyroidism   . Lupus   . Neuromuscular disorder ([facility])    fibromyalgia  . Pancreatitis   . Pneumonia     Past Surgical History:  Procedure Laterality Date  . APPENDECTOMY    . bladder tack    . CESAREAN SECTION    . CHOLECYSTECTOMY    . COLONOSCOPY     12 yrs ago  . LEFT HEART CATH AND CORONARY ANGIOGRAPHY N/A 11/22/2016   Procedure: Left Heart Cath and Coronary Angiography;  Surgeon: Burnell Blanks, MD;  Location: [facility];  Service: Cardiovascular;  Laterality: N/A;  . SEPTOPLASTY      Family History  Problem Relation Age of Onset  . Adopted: Yes  . Diabetes Mellitus II Mother   . Colon polyps Mother   . Peptic Ulcer Mother   . CAD Father   . Diabetes Mellitus II Father   . CAD Sister   . Diabetes Mellitus II Sister   . CAD Brother   . Diabetes Mellitus II Brother   . Breast cancer Maternal Grandmother   . Stomach cancer Maternal Grandmother   . Colon cancer Maternal Grandmother   . Cirrhosis Cousin        x 2  . Esophageal cancer Neg Hx   . Rectal cancer Neg Hx     Social History:  reports that she has never smoked. She has never used smokeless tobacco. She reports that she drinks alcohol. She reports that she does not use drugs.   Review of Systems  Psychiatric/Behavioral: Positive for depressed mood.     Lipid history: She has  been on Crestor for hyperlipidemia. Triglycerides Were higher in 2017    Lab Results  Component Value Date   CHOL 172 07/05/2017   HDL 42.70 07/05/2017   LDLCALC 109 (H) 10/07/2014   LDLDIRECT 104.0 07/05/2017   TRIG 232.0 (H) 07/05/2017   CHOLHDL 4 07/05/2017           Hypertension:   She is currently on low-dose 50 mg losartan  She is very anxious today and blood pressure is higher  Most recent eye exam was 5/17   Most recent foot exam:10/17  HYPOTHYROIDISM: She thinks she has been hypothyroid since age of 60 and at that time she was having fatigue and lethargy. Her dosage was increased in 5/17,  taking 125 g  TSH is stable as of June   Lab Results  Component Value Date   TSH 7.88 (H) 07/05/2017   TSH 0.50 02/23/2017   TSH 2.90 07/05/2016   FREET4 1.33 10/01/2009    Depression: She has been continued on Cymbalta, followed by PCP     Physical Examination:  BP (!) 144/92   Pulse 99   Ht 5\' 3"  (1.6 m)   Wt 196 lb 12.8 oz (89.3 kg)   SpO2 96%   BMI 34.86 kg/m      ASSESSMENT:  Diabetes type 2, uncontrolled   See history of present illness for detailed discussion of current diabetes management, blood sugar patterns and problems identified  She still has inadequate blood glucose control Despite instructions for modifying her management  she is only taking Ozempic in addition to  her previous inadequate dose of insulin Not clear why she did not get a prescription for Jardiance field also She is generally reluctant to increase medications or doses and does not focus on her diabetes as much as she needs to with following all instructions Also generally has difficulty following diet and not able to do any exercise  Her monitoring blood sugars is also inadequate and doing this only in the mornings lately with average 1/200    PLAN for diabetes:    Start taking NOVOLOG with every meal  She was started 20 units at breakfast and lunch and 25 at suppertime,  discussed that the doses will be adjusted based on readings after meals  Start titrating TRESIBA.  Again discussed in detail the flow sheet that will help her to titrate the dose every 3 days by 5 units starting with 60 units for now  When she finishes Ozempic 0.5 she will start using 1 mg weekly  She needs to check her blood sugars at least twice a day including at least one reading after one of her meals  Discussed blood sugar targets at various times  If she is starting to gain weight may consider adding Jardiance  She will try to be consistent with meat portions, snacks and may also do better with increasing Ozempic Reminded her to keep her appointments regularly  Hypertension: Blood pressure is high but she is very anxious today Continue follow-up with PCP  Her instructions were reviewed and the printout make sure she followed these  Patient Instructions  Call for 1mg  dose Ozempic when out  Check blood sugars on waking up  daily  Also check blood sugars about 2 hours after a meal and do this after different meals by rotation  Recommended blood sugar levels on waking up is 90-130 and about 2 hours after meal is 130-160  Please bring your blood sugar monitor to each visit, thank you  TRESIBA: This is your basal insulin and will bring your sugars down overnight Start with 60 units and increase the dose every 3 days by 5 units as explained on the flow sheet until morning sugars are below 150 at least  NOVOLOG: This is your mealtime insulin which keeps your sugar from going up after eating Take 20 units before breakfast and lunch and 25 before supper If her sugars are 200 or more before eating you can add another 5 units also Must try to take this before eating  Continue to watch high fat high carbohydrate meals and increase fluid intake    Counseling time on subjects discussed in assessment and plan sections is over 50% of today's 25 minute  visit   Madelynne Lasker 07/05/2017, 9:47 PM   Note: This office note was prepared with Estate agent. Any transcriptional errors that result from this process are unintentional.

## 2017-07-05 NOTE — Patient Instructions (Addendum)
Call for 1mg  dose Ozempic when out  Check blood sugars on waking up  daily  Also check blood sugars about 2 hours after a meal and do this after different meals by rotation  Recommended blood sugar levels on waking up is 90-130 and about 2 hours after meal is 130-160  Please bring your blood sugar monitor to each visit, thank you  TRESIBA: This is your basal insulin and will bring your sugars down overnight Start with 60 units and increase the dose every 3 days by 5 units as explained on the flow sheet until morning sugars are below 150 at least  NOVOLOG: This is your mealtime insulin which keeps your sugar from going up after eating Take 20 units before breakfast and lunch and 25 before supper If her sugars are 200 or more before eating you can add another 5 units also Must try to take this before eating  Continue to watch high fat high carbohydrate meals and increase fluid intake

## 2017-07-06 ENCOUNTER — Other Ambulatory Visit: Payer: Self-pay

## 2017-07-06 LAB — FRUCTOSAMINE: Fructosamine: 376 umol/L — ABNORMAL HIGH (ref 0–285)

## 2017-07-06 MED ORDER — LEVOTHYROXINE SODIUM 137 MCG PO CAPS: 137.0000 ug | ORAL_CAPSULE | Freq: Every day | ORAL | 0 refills | Status: DC

## 2017-07-09 ENCOUNTER — Telehealth: Payer: Self-pay | Admitting: Family Medicine

## 2017-07-09 DIAGNOSIS — Z1159 Encounter for screening for other viral diseases: Secondary | ICD-10-CM

## 2017-07-09 NOTE — Telephone Encounter (Signed)
Pt would like to have a pneumonia vaccine and HepC Screening is the pt eligible for these?

## 2017-07-09 NOTE — Telephone Encounter (Signed)
OK to get Hep C antibody.  Looks like she's had prior pneumovax.  She should get Prevnar 66 at age 61.

## 2017-07-09 NOTE — Telephone Encounter (Signed)
Left message on machine for patient to return our call 

## 2017-07-10 ENCOUNTER — Telehealth: Payer: Self-pay | Admitting: Endocrinology

## 2017-07-10 ENCOUNTER — Other Ambulatory Visit: Payer: Self-pay

## 2017-07-10 ENCOUNTER — Other Ambulatory Visit: Payer: Self-pay | Admitting: Family Medicine

## 2017-07-10 MED ORDER — LEVOTHYROXINE SODIUM 137 MCG PO CAPS: 137.0000 ug | ORAL_CAPSULE | Freq: Every day | ORAL | 0 refills | Status: DC

## 2017-07-10 NOTE — Telephone Encounter (Signed)
Patient is aware. Lab order and appointment made.

## 2017-07-10 NOTE — Telephone Encounter (Signed)
Called patient and let her know that the prescription was sent on Friday and she stated they never received this prescription so [facility] [place] [facility] and let her know that I have resent this.

## 2017-07-10 NOTE — Telephone Encounter (Signed)
Caller Name: Jenni Thew    Relationship to Patient: [place] [place] [facility]  Reason for call: Pt states her Levothroxine was supposed to be increased and called in.  She is requesting a call back when completed.

## 2017-07-10 NOTE — Telephone Encounter (Signed)
Please advise. Not sure if the levothyroxine 137 is the new dosage?

## 2017-07-10 NOTE — Telephone Encounter (Signed)
Yes, prescription was sent

## 2017-07-11 ENCOUNTER — Other Ambulatory Visit: Payer: Medicare Other

## 2017-07-12 ENCOUNTER — Other Ambulatory Visit: Payer: Self-pay | Admitting: Endocrinology

## 2017-07-13 ENCOUNTER — Telehealth: Payer: Self-pay | Admitting: Endocrinology

## 2017-07-13 NOTE — Telephone Encounter (Signed)
[facility] and [facility] for brand name.  They ran medication for generic and script went through .  Patient advised and she will pick up script.

## 2017-07-13 NOTE — Telephone Encounter (Signed)
Patient's insurance denied the prescription for Thyroid medication. Patient still has no replacement medication. It's been over 2 weeks. Please call patient to discuss or call new prescription [facility]

## 2017-08-02 DIAGNOSIS — H6122 Impacted cerumen, left ear: Secondary | ICD-10-CM | POA: Diagnosis not present

## 2017-08-09 ENCOUNTER — Other Ambulatory Visit: Payer: Self-pay | Admitting: Endocrinology

## 2017-08-12 ENCOUNTER — Other Ambulatory Visit: Payer: Self-pay | Admitting: Endocrinology

## 2017-08-12 DIAGNOSIS — Z794 Long term (current) use of insulin: Principal | ICD-10-CM

## 2017-08-12 DIAGNOSIS — E063 Autoimmune thyroiditis: Secondary | ICD-10-CM

## 2017-08-12 DIAGNOSIS — E1165 Type 2 diabetes mellitus with hyperglycemia: Secondary | ICD-10-CM

## 2017-08-15 ENCOUNTER — Ambulatory Visit (INDEPENDENT_AMBULATORY_CARE_PROVIDER_SITE_OTHER): Payer: Medicare Other | Admitting: Family Medicine

## 2017-08-15 ENCOUNTER — Encounter: Payer: Self-pay | Admitting: Family Medicine

## 2017-08-15 VITALS — BP 110/80 | HR 100 | Temp 98.3°F | Wt 191.9 lb

## 2017-08-15 DIAGNOSIS — B001 Herpesviral vesicular dermatitis: Secondary | ICD-10-CM | POA: Diagnosis not present

## 2017-08-15 DIAGNOSIS — Z1159 Encounter for screening for other viral diseases: Secondary | ICD-10-CM | POA: Diagnosis not present

## 2017-08-15 MED ORDER — VALACYCLOVIR HCL 1 G PO TABS
ORAL_TABLET | ORAL | 0 refills | Status: DC
Start: 2017-08-15 — End: 2017-08-21

## 2017-08-15 NOTE — Progress Notes (Signed)
Subjective:     Patient ID: Yesenia Wilson, female   DOB: Jan 31, 1956, 61 y.o.   MRN: [id number]  HPI Patient seen with concern for possible cold sore. She's never had a history of these before. She did recently break up with boyfriend who had cold sores. Patient also states she took part in some type of communion with her church where they all drank out of the same flask. Current sore started on her right lower lip about one week ago. She's not had any other oral lesions. No fevers or chills.  Past Medical History:  Diagnosis Date  . Anxiety   . Asthma   . Clotting disorder ([place])    DVT left leg between ankle and knee due to tennis injury  . COPD (chronic obstructive pulmonary disease) ([place])   . Depression   . Diabetes mellitus   . DVT (deep venous thrombosis) (H[facility])    left leg  . Fibromyalgia   . GERD (gastroesophageal reflux disease)   . Heart murmur   . Hyperlipidemia   . Hypertension   . Hypothyroidism   . Lupus   . Neuromuscular disorder (H[facility])    fibromyalgia  . Pancreatitis   . Pneumonia    Past Surgical History:  Procedure Laterality Date  . APPENDECTOMY    . bladder tack    . CESAREAN SECTION    . CHOLECYSTECTOMY    . COLONOSCOPY     12 yrs ago  . LEFT HEART CATH AND CORONARY ANGIOGRAPHY N/A 11/22/2016   Procedure: Left Heart Cath and Coronary Angiography;  Surgeon: Burnell Blanks, MD;  Location: [facility];  Service: Cardiovascular;  Laterality: N/A;  . SEPTOPLASTY      reports that  has never smoked. she has never used smokeless tobacco. She reports that she drinks alcohol. She reports that she does not use drugs. family history includes Breast cancer in her maternal grandmother; CAD in her brother, father, and sister; Cirrhosis in her cousin; Colon cancer in her maternal grandmother; Colon polyps in her mother; Diabetes Mellitus II in her brother, father, mother, and sister; Peptic Ulcer in her mother; Stomach cancer in her maternal grandmother. She  was adopted. Allergies  Allergen Reactions  . Oxycodone-Acetaminophen     REACTION: GI upset  . Propoxyphene N-Acetaminophen     REACTION: nausea, rapid heartrate     Review of Systems  Constitutional: Negative for chills and fever.  HENT: Negative for sore throat.        Objective:   Physical Exam  Constitutional: She appears well-developed and well-nourished.  HENT:  Patient has crusted lesion right lower lip. Appears to be healing. No intraoral lesions  Cardiovascular: Normal rate and regular rhythm.  Pulmonary/Chest: Effort normal and breath sounds normal. No respiratory distress. She has no wheezes. She has no rales.       Assessment:     Sore on right lower lip. Suspect cold sore    Plan:     -Discussed issues regarding cold sores-risk of recurrence, potential for infectivity, etc. . We wrote prescription for Valtrex 1 g to take two at onset of recurrent cold sore and repeat 2 in 12 hours -Touch base if current skin lesion not fully healed over in 2-3 weeks  Eulas Post MD [facility]

## 2017-08-15 NOTE — Patient Instructions (Signed)
Cold Sore A cold sore, also called a fever blister, is a skin infection that causes small, fluid-filled sores to form inside of the mouth or on the lips, gums, nose, chin, or cheeks. Cold sores can spread to other parts of the body, such as the eyes or fingers. In some people with other medical conditions, cold sores can spread to multiple other body sites, including the genitals. Cold sores can be spread or passed from person to person (contagious) until the sores crust over completely. What are the causes? Cold sores are caused by the herpes simplex virus (HSV-1). HSV-1 is closely related to the virus that causes genital herpes (HSV-2), but these viruses are not the same. Once a person is infected with HSV-1, the virus remains permanently in the body. HSV-1 is spread from person to person through close contact, such as through kissing, touching the affected area, or sharing personal items such as lip balm, razors, or eating utensils. What increases the risk? A cold sore outbreak is more likely to develop in people who:  Are tired, stressed, or sick.  Are menstruating.  Are pregnant.  Take certain medicines.  Are exposed to cold weather or too much sun.  What are the signs or symptoms? Symptoms of a cold sore outbreak often go through different stages. Here is how a cold sore develops:  Tingling, itching, or burning is felt 1-2 days before the outbreak.  Fluid-filled blisters appear on the lips, inside the mouth, on the nose, or on the cheeks.  The blisters start to ooze clear fluid.  The blisters dry up and a yellow crust appears in its place.  The crust falls off.  Other symptoms include:  Fever.  Sore throat.  Headache.  Muscle aches.  Swollen neck glands.  You also may not have any symptoms. How is this diagnosed? This condition is often diagnosed based on your medical history and a physical exam. Your health care provider may swab your sore and then examine it in  the lab. Rarely, blood tests may be done to check for HSV-1. How is this treated? There is no cure for cold sores or HSV-1. There also is no vaccine for HSV-1. Most cold sores go away on their own without treatment within two weeks. Medicines cannot make the infection go away, but medicines can:  Help relieve some of the pain associated with the sores.  Work to stop the virus from multiplying.  Shorten healing time.  Medicines may be in the form of creams, gels, pills, or a shot. Follow these instructions at home: Medicines  Take or apply over-the-counter and prescription medicines only as told by your health care provider.  Use a cotton-tip swab to apply creams or gels to your sores. Sore Care  Do not touch the sores or pick the scabs.  Wash your hands often. Do not touch your eyes without washing your hands first.  Keep the sores clean and dry.  If directed, apply ice to the sores: ? Put ice in a plastic bag. ? Place a towel between your skin and the bag. ? Leave the ice on for 20 minutes, 2-3 times per day. Lifestyle  Do not kiss, have oral sex, or share personal items until your sores heal.  Eat a soft, bland diet. Avoid eating hot, cold, or salty foods. These can hurt your mouth.  Use a straw if it hurts to drink out of a glass.  Avoid the sun and limit your stress if these things   trigger outbreaks. If sun causes cold sores, apply sunscreen on your lips before being out in the sun. Contact a health care provider if:  You have symptoms for more than two weeks.  You have pus coming from the sores.  You have redness that is spreading.  You have pain or irritation in your eye.  You get sores on your genitals.  Your sores do not heal within two weeks.  You have frequent cold sore outbreaks. Get help right away if:  You have a fever and your symptoms suddenly get worse.  You have a headache and confusion. This information is not intended to replace advice  given to you by your health care provider. Make sure you discuss any questions you have with your health care provider. Document Released: 09/01/2000 Document Revised: 04/28/2016 Document Reviewed: 06/25/2015 Elsevier Interactive Patient Education  2018 [employer].  Let me know if sore on lip not completely clear in 2-3 weeks.

## 2017-08-16 ENCOUNTER — Encounter: Payer: Self-pay | Admitting: Family Medicine

## 2017-08-16 ENCOUNTER — Telehealth: Payer: Self-pay | Admitting: *Deleted

## 2017-08-16 LAB — HEPATITIS C ANTIBODY
HEP C AB: NONREACTIVE
SIGNAL TO CUT-OFF: 0.02 (ref <1.00)

## 2017-08-16 NOTE — Telephone Encounter (Signed)
Copied from [place]. Topic: General - Other >> Aug 16, 2017  3:50 PM  Stare wrote: Reason for CRM:   Pt call to say she found out her daughter has cancer and is asking for something for her nerves. .  Pharmacy ; [facility] [place]

## 2017-08-17 ENCOUNTER — Ambulatory Visit: Payer: Medicare Other | Admitting: Endocrinology

## 2017-08-17 NOTE — Telephone Encounter (Signed)
Let her know we are very sorry to hear about that.  Would not recommend benzodiazepines.  Would offer follow up or referral to behavioral health if she would be interested in some counseling.

## 2017-08-17 NOTE — Telephone Encounter (Signed)
I called the pt and informed her of the message below.  Appt scheduled for 12/3 at 11am.

## 2017-08-20 ENCOUNTER — Ambulatory Visit: Payer: Medicare Other | Admitting: Family Medicine

## 2017-08-21 ENCOUNTER — Other Ambulatory Visit: Payer: Self-pay | Admitting: *Deleted

## 2017-08-21 MED ORDER — VALACYCLOVIR HCL 1 G PO TABS: ORAL_TABLET | ORAL | 0 refills | Status: DC

## 2017-08-24 ENCOUNTER — Other Ambulatory Visit: Payer: Self-pay | Admitting: *Deleted

## 2017-08-24 MED ORDER — LOSARTAN POTASSIUM 50 MG PO TABS: 50.0000 mg | ORAL_TABLET | Freq: Every day | ORAL | 1 refills | Status: DC

## 2017-08-24 NOTE — Telephone Encounter (Signed)
Rx done. 

## 2017-08-29 ENCOUNTER — Other Ambulatory Visit: Payer: Self-pay

## 2017-08-29 ENCOUNTER — Other Ambulatory Visit: Payer: Self-pay | Admitting: Endocrinology

## 2017-08-29 MED ORDER — GLUCOSE BLOOD VI STRP: ORAL_STRIP | 4 refills | Status: DC

## 2017-09-03 ENCOUNTER — Telehealth: Payer: Self-pay | Admitting: Family Medicine

## 2017-09-03 MED ORDER — FLUCONAZOLE 150 MG PO TABS: 150.0000 mg | ORAL_TABLET | Freq: Once | ORAL | 0 refills | Status: AC

## 2017-09-03 NOTE — Telephone Encounter (Signed)
Rx sent 

## 2017-09-03 NOTE — Telephone Encounter (Signed)
We can call in one Fluconazole 150 mg - but needs follow up if not better after that.

## 2017-09-03 NOTE — Telephone Encounter (Signed)
Copied from [place]. Topic: Quick Communication - See Telephone Encounter >> Sep 03, 2017  9:24 AM Percell Belt A wrote: CRM for notification. See Telephone encounter for: pt called in and states she has a yeast infection and would like to know if something can be called in for her  [place] [facility]    09/03/17.

## 2017-09-05 ENCOUNTER — Other Ambulatory Visit: Payer: Self-pay

## 2017-09-05 MED ORDER — ROSUVASTATIN CALCIUM 20 MG PO TABS: ORAL_TABLET | ORAL | 1 refills | Status: DC

## 2017-09-10 ENCOUNTER — Other Ambulatory Visit: Payer: Self-pay

## 2017-09-10 ENCOUNTER — Other Ambulatory Visit: Payer: Self-pay | Admitting: Family Medicine

## 2017-09-10 MED ORDER — ROSUVASTATIN CALCIUM 20 MG PO TABS: ORAL_TABLET | ORAL | 1 refills | Status: DC

## 2017-09-12 ENCOUNTER — Ambulatory Visit (INDEPENDENT_AMBULATORY_CARE_PROVIDER_SITE_OTHER): Payer: Medicare Other | Admitting: Family Medicine

## 2017-09-12 ENCOUNTER — Other Ambulatory Visit: Payer: Self-pay

## 2017-09-12 VITALS — BP 140/70 | HR 135 | Temp 100.3°F | Ht 63.0 in | Wt 192.2 lb

## 2017-09-12 DIAGNOSIS — M1990 Unspecified osteoarthritis, unspecified site: Secondary | ICD-10-CM | POA: Diagnosis not present

## 2017-09-12 DIAGNOSIS — R42 Dizziness and giddiness: Secondary | ICD-10-CM | POA: Diagnosis not present

## 2017-09-12 DIAGNOSIS — Z79899 Other long term (current) drug therapy: Secondary | ICD-10-CM | POA: Diagnosis not present

## 2017-09-12 DIAGNOSIS — K219 Gastro-esophageal reflux disease without esophagitis: Secondary | ICD-10-CM | POA: Diagnosis not present

## 2017-09-12 DIAGNOSIS — M797 Fibromyalgia: Secondary | ICD-10-CM | POA: Diagnosis not present

## 2017-09-12 DIAGNOSIS — R059 Cough, unspecified: Secondary | ICD-10-CM

## 2017-09-12 DIAGNOSIS — R404 Transient alteration of awareness: Secondary | ICD-10-CM | POA: Diagnosis not present

## 2017-09-12 DIAGNOSIS — J111 Influenza due to unidentified influenza virus with other respiratory manifestations: Secondary | ICD-10-CM | POA: Diagnosis not present

## 2017-09-12 DIAGNOSIS — J449 Chronic obstructive pulmonary disease, unspecified: Secondary | ICD-10-CM | POA: Diagnosis not present

## 2017-09-12 DIAGNOSIS — R509 Fever, unspecified: Secondary | ICD-10-CM | POA: Diagnosis not present

## 2017-09-12 DIAGNOSIS — R05 Cough: Secondary | ICD-10-CM

## 2017-09-12 DIAGNOSIS — R0602 Shortness of breath: Secondary | ICD-10-CM | POA: Diagnosis not present

## 2017-09-12 DIAGNOSIS — F329 Major depressive disorder, single episode, unspecified: Secondary | ICD-10-CM | POA: Diagnosis not present

## 2017-09-12 DIAGNOSIS — J101 Influenza due to other identified influenza virus with other respiratory manifestations: Secondary | ICD-10-CM | POA: Diagnosis not present

## 2017-09-12 DIAGNOSIS — Z7982 Long term (current) use of aspirin: Secondary | ICD-10-CM | POA: Diagnosis not present

## 2017-09-12 DIAGNOSIS — E119 Type 2 diabetes mellitus without complications: Secondary | ICD-10-CM | POA: Diagnosis not present

## 2017-09-12 DIAGNOSIS — E7800 Pure hypercholesterolemia, unspecified: Secondary | ICD-10-CM | POA: Diagnosis not present

## 2017-09-12 DIAGNOSIS — E039 Hypothyroidism, unspecified: Secondary | ICD-10-CM | POA: Diagnosis not present

## 2017-09-12 DIAGNOSIS — G4489 Other headache syndrome: Secondary | ICD-10-CM | POA: Diagnosis not present

## 2017-09-12 DIAGNOSIS — Z794 Long term (current) use of insulin: Secondary | ICD-10-CM | POA: Diagnosis not present

## 2017-09-12 MED ORDER — LEVOFLOXACIN 500 MG PO TABS: 500.0000 mg | ORAL_TABLET | Freq: Every day | ORAL | 0 refills | Status: DC

## 2017-09-12 MED ORDER — METFORMIN HCL 1000 MG PO TABS: ORAL_TABLET | ORAL | 1 refills | Status: DC

## 2017-09-12 NOTE — Patient Instructions (Signed)
Push fluids Start the Levaquin tonight Follow up immediately for any increased shortness of breath or any worsening symptoms.

## 2017-09-12 NOTE — Progress Notes (Signed)
Subjective:     Patient ID: Yesenia Wilson, female   DOB: [birth year], 61 y.o.   MRN: [id number]  HPI Patient seen for acute illness. She developed sore throat about 3 days ago and has had increasing cough, body aches, fatigue. She does not have thermometer but has had some subjective fever past couple days. Cough productive of green sputum. Denies any nausea or vomiting. No dyspnea at rest. No sick contacts.  She has type 2 diabetes. Patient is nonsmoker. Does have history of reported asthma but is not taking regular inhalers for that  Past Medical History:  Diagnosis Date  . Anxiety   . Asthma   . Clotting disorder ([place])    DVT left leg between ankle and knee due to tennis injury  . COPD (chronic obstructive pulmonary disease) ([place])   . Depression   . Diabetes mellitus   . DVT (deep venous thrombosis) ([facility])    left leg  . Fibromyalgia   . GERD (gastroesophageal reflux disease)   . Heart murmur   . Hyperlipidemia   . Hypertension   . Hypothyroidism   . Lupus   . Neuromuscular disorder ([facility])    fibromyalgia  . Pancreatitis   . Pneumonia    Past Surgical History:  Procedure Laterality Date  . APPENDECTOMY    . bladder tack    . CESAREAN SECTION    . CHOLECYSTECTOMY    . COLONOSCOPY     12 yrs ago  . LEFT HEART CATH AND CORONARY ANGIOGRAPHY N/A 11/22/2016   Procedure: Left Heart Cath and Coronary Angiography;  Surgeon: Burnell Blanks, MD;  Location: [facility];  Service: Cardiovascular;  Laterality: N/A;  . SEPTOPLASTY      reports that  has never smoked. she has never used smokeless tobacco. She reports that she drinks alcohol. She reports that she does not use drugs. family history includes Breast cancer in her maternal grandmother; CAD in her brother, father, and sister; Cirrhosis in her cousin; Colon cancer in her maternal grandmother; Colon polyps in her mother; Diabetes Mellitus II in her brother, father, mother, and sister; Peptic Ulcer in her mother;  Stomach cancer in her maternal grandmother. She was adopted. Allergies  Allergen Reactions  . Oxycodone-Acetaminophen     REACTION: GI upset  . Propoxyphene N-Acetaminophen     REACTION: nausea, rapid heartrate     Review of Systems  Constitutional: Positive for chills, fatigue and fever.  HENT: Positive for congestion and sore throat.   Respiratory: Positive for cough.   Cardiovascular: Negative for chest pain.  Gastrointestinal: Negative for nausea and vomiting.       Objective:   Physical Exam  Constitutional: She appears well-developed and well-nourished. No distress.  HENT:  Right Ear: External ear normal.  Left Ear: External ear normal.  Mouth/Throat: Oropharynx is clear and moist. No oropharyngeal exudate.  Neck: Neck supple.  Cardiovascular: Normal rate.  Pulmonary/Chest: Effort normal and breath sounds normal. No respiratory distress. She has no wheezes. She has no rales.  Lymphadenopathy:    She has no cervical adenopathy.  Neurological: She is alert.  Skin: No rash noted.       Assessment:     Patient presents with acute febrile illness with upper respiratory symptoms including cough. She is in no respiratory distress.  No rales were auscultated but concern is whether she could have early pneumonia given persistent subjective fever    Plan:     -We considered influenza testing but elected not  to since she is more than 48 hours into illness -We decided to cover with Levaquin 500 milligrams once daily for 10 days -Reassess in 2 days and sooner as needed -Chest x-ray for any worsening/persistent symptoms -Monitor blood sugars closely  Eulas Post MD [facility]

## 2017-09-20 ENCOUNTER — Other Ambulatory Visit: Payer: Self-pay | Admitting: Family Medicine

## 2017-09-20 NOTE — Telephone Encounter (Signed)
Copied from [place] [id number]. Topic: Quick Communication - Rx Refill/Question >> Sep 20, 2017  1:55 PM Scherrie Gerlach wrote: Pt seen 12/26 and states she ended up going to the ed. But pt states she is still coughing and keeping her up at night. Would like to know if Dr Elease Hashimoto would call in cough med?  Pt states all other symptoms have gone, except for the cough  [id number] - [place], [address] [place] [phone] (Phone) [phone] (Fax)

## 2017-09-21 NOTE — Telephone Encounter (Signed)
OK to try Tessalon Perles 100 mg every 8 hours as needed for cough #30 with no refill

## 2017-09-24 MED ORDER — BENZONATATE 100 MG PO CAPS: 100.0000 mg | ORAL_CAPSULE | Freq: Three times a day (TID) | ORAL | 0 refills | Status: DC | PRN

## 2017-09-24 NOTE — Telephone Encounter (Signed)
Pt was informed. PRESCRIPTION SENT IN ELECTRONICALLY.

## 2017-09-28 ENCOUNTER — Other Ambulatory Visit: Payer: Self-pay

## 2017-09-28 MED ORDER — LEVOTHYROXINE SODIUM 137 MCG PO TABS: ORAL_TABLET | ORAL | 1 refills | Status: DC

## 2017-10-01 ENCOUNTER — Telehealth: Payer: Self-pay | Admitting: Family Medicine

## 2017-10-01 NOTE — Telephone Encounter (Signed)
Copied from [place] [id number]. Topic: Inquiry >> Oct 01, 2017  4:49 PM Conception Chancy, NT wrote: Reason for CRM: patient states she has another female infection that Dr. Elease Hashimoto is aware of and is wanting him to call in the medicine for it. Please advise.

## 2017-10-02 ENCOUNTER — Other Ambulatory Visit: Payer: Self-pay | Admitting: Family Medicine

## 2017-10-02 MED ORDER — FLUCONAZOLE 150 MG PO TABS: 150.0000 mg | ORAL_TABLET | Freq: Once | ORAL | 0 refills | Status: AC

## 2017-10-02 NOTE — Telephone Encounter (Signed)
Spoke with patient and she has a yeast infection.  rx sent.

## 2017-10-02 NOTE — Telephone Encounter (Signed)
Please verify. Is she is referring to yeast infection?. If she is having symptoms consistent with prior Candida (white discharge and itching) may refill fluconazole 150 mg 1 dose

## 2017-10-10 ENCOUNTER — Encounter: Payer: Self-pay | Admitting: Obstetrics and Gynecology

## 2017-10-10 ENCOUNTER — Ambulatory Visit (INDEPENDENT_AMBULATORY_CARE_PROVIDER_SITE_OTHER): Payer: Medicare Other | Admitting: Obstetrics and Gynecology

## 2017-10-10 VITALS — BP 130/80 | HR 94 | Ht 64.0 in | Wt 198.0 lb

## 2017-10-10 DIAGNOSIS — R635 Abnormal weight gain: Secondary | ICD-10-CM | POA: Diagnosis not present

## 2017-10-10 DIAGNOSIS — E119 Type 2 diabetes mellitus without complications: Secondary | ICD-10-CM

## 2017-10-10 DIAGNOSIS — Z113 Encounter for screening for infections with a predominantly sexual mode of transmission: Secondary | ICD-10-CM | POA: Diagnosis not present

## 2017-10-10 DIAGNOSIS — N644 Mastodynia: Secondary | ICD-10-CM | POA: Diagnosis not present

## 2017-10-10 MED ORDER — FLUCONAZOLE 150 MG PO TABS: 150.0000 mg | ORAL_TABLET | Freq: Once | ORAL | 1 refills | Status: AC

## 2017-10-10 NOTE — Progress Notes (Addendum)
Patient ID: Yesenia Wilson, female   DOB: June 02, 1956, 62 y.o.   MRN: [id number]   [facility] Visit  @DATE @            Patient name: Yesenia Wilson MRN [id number]  Date of birth: 1956-07-27  NEW GYN PATIENT  CC & HPI:  Yesenia Wilson is a 62 y.o. female presenting today for right breast pain that started the other day, last week,, but has since resolved. She has not been to the OBGYN in over ten years and has not had any breast exams. She is also concerned that she might have an STD because about 2 weeks ago she had a sore to her labia. She reports this past year she has been getting these sores about once every 2-3 months. They are in different locations each time. Yesenia Wilson also adds about 1 month ago she had a fever blister and was given antibiotics by her PCP: Yesenia Post, MD. She is also concerned that she has a yeast infection starting. She states frequently getting them and that her endocrinologist thinks they might be related to her A2DM. She is currently not exercising because of recent back surgery She is interested in exersing once she is released from her surgeon. Yesenia Wilson has gained weight since she hasn't been able to be active. She recently started a new diet. The patient states that she has been very sexually active almost daily with one female partner for the last three years. She has surgical history of a hysterectomy and c-section with poor healing from the surgical scars. She denies fever, chills or any other symptoms or complaints at this time.   ROS:  ROS +right breast pain +vaginal sore now resolved +weight gain -fever -chills All systems are negative except as noted in the HPI and PMH.   Pertinent History Reviewed:   Reviewed: c-section and hyseterectomy Medical         Past Medical History:  Diagnosis Date  . Anxiety   . Asthma   . Clotting disorder ([place])    DVT left leg between ankle and knee due to tennis injury  . COPD (chronic obstructive pulmonary  disease) ([place])   . Depression   . Diabetes mellitus   . DVT (deep venous thrombosis) ([facility])    left leg  . Fibromyalgia   . GERD (gastroesophageal reflux disease)   . Heart murmur   . Hiatal hernia   . Hyperlipidemia   . Hypertension   . Hypothyroidism   . Lupus   . Neuromuscular disorder ([facility])    fibromyalgia  . Pancreatitis   . Pneumonia                               Surgical Hx:    Past Surgical History:  Procedure Laterality Date  . APPENDECTOMY    . bladder tack    . CESAREAN SECTION    . CHOLECYSTECTOMY    . COLONOSCOPY     12 yrs ago  . LEFT HEART CATH AND CORONARY ANGIOGRAPHY N/A 11/22/2016   Procedure: Left Heart Cath and Coronary Angiography;  Surgeon: Burnell Blanks, MD;  Location: [facility];  Service: Cardiovascular;  Laterality: N/A;  . SEPTOPLASTY     Medications: Reviewed & Updated - see associated section                       Current Outpatient Medications:  .  ARIPiprazole (ABILIFY) 2 MG tablet, TAKE 1 TABLET (2 MG TOTAL) BY MOUTH AT BEDTIME., Disp: 90 tablet, Rfl: 1 .  BD PEN NEEDLE NANO U/F 32G X 4 MM MISC, USE 3 PER TO DAY TO INJECT NOVOLOG, Disp: , Rfl: 3 .  benzonatate (TESSALON) 100 MG capsule, Take 1 capsule (100 mg total) by mouth 3 (three) times daily as needed for cough., Disp: 30 capsule, Rfl: 0 .  DULoxetine (CYMBALTA) 60 MG capsule, TAKE 1 CAPSULE (60 MG TOTAL) BY MOUTH DAILY., Disp: 90 capsule, Rfl: 2 .  glucose blood (ONETOUCH VERIO) test strip, USE TO TEST BLOOD SUGAR 4 TIMES A DAY DX CODE E11.9, Disp: 400 each, Rfl: 4 .  Insulin Degludec (TRESIBA FLEXTOUCH) 200 UNIT/ML SOPN, Inject 60 Units into the skin 3 (three) times daily., Disp: 15 pen, Rfl: 3 .  levothyroxine (SYNTHROID) 137 MCG tablet, TAKE 1 CAPSULE (137 MCG TOTAL) BY MOUTH DAILY BEFORE BREAKFAST., Disp: 90 tablet, Rfl: 1 .  losartan (COZAAR) 50 MG tablet, Take 1 tablet (50 mg total) by mouth daily., Disp: 90 tablet, Rfl: 1 .  metFORMIN (GLUCOPHAGE) 1000 MG tablet,  TAKE 1 TABLET (1,000 MG TOTAL) BY MOUTH 2 (TWO) TIMES DAILY., Disp: 180 tablet, Rfl: 1 .  NOVOLOG FLEXPEN 100 UNIT/ML FlexPen, 10 UNITS BEFORE MAIN MEAL DAILY. IF SUGARS >180,TAKE WITH OTHER MEAL(S) ALSO. ADD 15-20U IF NEEDED (Patient taking differently: 15 UNITS BEFORE MAIN MEAL DAILY. IF SUGARS >180,TAKE WITH OTHER MEAL(S) ALSO. ADD 20U IF NEEDED), Disp: 15 pen, Rfl: 2 .  ONETOUCH DELICA LANCETS FINE MISC, Use to check blood sugar 4 times per day. Dx Code E11.9, Disp: 200 each, Rfl: 2 .  pantoprazole (PROTONIX) 40 MG tablet, Take 1 tablet (40 mg total) by mouth 2 (two) times daily., Disp: 180 tablet, Rfl: 1 .  rosuvastatin (CRESTOR) 20 MG tablet, TAKE 1 TABLET (20 MG TOTAL) BY MOUTH DAILY., Disp: 90 tablet, Rfl: 1 .  Semaglutide (OZEMPIC) 0.25 or 0.5 MG/DOSE SOPN, Inject 0.5 mg into the skin once a week., Disp: 1 pen, Rfl: 2 .  valACYclovir (VALTREX) 1000 MG tablet, TAKE 2 AT ONSET OF COLD SORE AND REPEAT 2 IN 12 HOURS., Disp: 90 tablet, Rfl: 0 .  aspirin 81 MG tablet, Take 81 mg by mouth daily.  , Disp: , Rfl:  .  esomeprazole (NEXIUM) 40 MG capsule, TAKE ONE CAPSULE BY MOUTH EVERY DAY BEFORE BREAKFAST (Patient not taking: Reported on 10/10/2017), Disp: 90 capsule, Rfl: 1 .  hydrochlorothiazide (HYDRODIURIL) 12.5 MG tablet, Take 12.5 mg by mouth daily., Disp: , Rfl:  .  irbesartan-hydrochlorothiazide (AVALIDE) 300-12.5 MG tablet, Take by mouth., Disp: , Rfl:  .  levofloxacin (LEVAQUIN) 500 MG tablet, Take 1 tablet (500 mg total) by mouth daily. (Patient not taking: Reported on 10/10/2017), Disp: 10 tablet, Rfl: 0 .  naproxen sodium (ANAPROX) 220 MG tablet, Take 220 mg by mouth., Disp: , Rfl:   Current Facility-Administered Medications:  .  0.9 %  sodium chloride infusion, 500 mL, Intravenous, Continuous, Ladene Artist, MD   Social History: Reviewed -  reports that  has never smoked. she has never used smokeless tobacco.  Objective Findings:  Vitals: Blood pressure 130/80, pulse 94, height 5'  4" (1.626 m), weight 198 lb (89.8 kg).  PHYSICAL EXAMINATION General appearance - alert, well appearing, and in no distress, oriented to person, place, and time and overweight Mental status - alert, oriented to person, place, and time, normal mood, behavior, speech, dress, motor activity, and thought processes, affect  appropriate to mood Abdomen - lower abdominal midline vertical incision Breasts - breasts appear normal, no suspicious masses, no skin or nipple changes or axillary nodes  PELVIC External genitalia - normal Vagina - good vaginal secretions, no leisons, good support Cervix - surgically absent Uterus - surgically absent Rectal - hemoccult negative, no leisons   Assessment & Plan:   A: Breast pain with normal breast exam 1. I 2. A2DM, stable 3. No visual signs of sexually transmitted dieseases  4; increased sex drive  P:  1. Rx Diflucan, emperical 2. HIV, RPR and HSV II blood work, GC/ Chlamydia collected 3. Routine mammogram, advised 4. F/U 1 year or PRN   By signing my name below, I, Margit Banda, attest that this documentation has been prepared under the direction and in the presence of Jonnie Kind, MD. Electronically Signed: Margit Banda, Medical Scribe. 10/10/17. 3:25 PM.  I personally performed the services described in this documentation, which was SCRIBED in my presence. The recorded information has been reviewed and considered accurate. It has been edited as necessary during review. Jonnie Kind, MD

## 2017-10-11 DIAGNOSIS — Z113 Encounter for screening for infections with a predominantly sexual mode of transmission: Secondary | ICD-10-CM | POA: Diagnosis not present

## 2017-10-11 LAB — HIV ANTIBODY (ROUTINE TESTING W REFLEX): HIV SCREEN 4TH GENERATION: NONREACTIVE

## 2017-10-11 LAB — HSV 2 ANTIBODY, IGG: HSV 2 IGG, TYPE SPEC: 4.74 {index} — AB (ref 0.00–0.90)

## 2017-10-11 LAB — HSV-2 IGG SUPPLEMENTAL TEST: HSV-2 IGG SUPPLEMENTAL TEST: POSITIVE — AB

## 2017-10-11 LAB — RPR: RPR: NONREACTIVE

## 2017-10-13 LAB — GC/CHLAMYDIA PROBE AMP
Chlamydia trachomatis, NAA: NEGATIVE
NEISSERIA GONORRHOEAE BY PCR: NEGATIVE

## 2017-10-15 ENCOUNTER — Telehealth: Payer: Self-pay | Admitting: *Deleted

## 2017-10-15 NOTE — Telephone Encounter (Signed)
Informed patient HSV-2 was suggestive that she may have had infection at some point. Pt verbalized understanding.

## 2017-10-23 ENCOUNTER — Other Ambulatory Visit: Payer: Self-pay | Admitting: Obstetrics and Gynecology

## 2017-10-23 DIAGNOSIS — M5126 Other intervertebral disc displacement, lumbar region: Secondary | ICD-10-CM | POA: Diagnosis not present

## 2017-10-23 DIAGNOSIS — M47816 Spondylosis without myelopathy or radiculopathy, lumbar region: Secondary | ICD-10-CM | POA: Diagnosis not present

## 2017-10-23 DIAGNOSIS — M5136 Other intervertebral disc degeneration, lumbar region: Secondary | ICD-10-CM | POA: Diagnosis not present

## 2017-10-23 DIAGNOSIS — Z981 Arthrodesis status: Secondary | ICD-10-CM | POA: Diagnosis not present

## 2017-10-23 DIAGNOSIS — Z1231 Encounter for screening mammogram for malignant neoplasm of breast: Secondary | ICD-10-CM

## 2017-10-31 ENCOUNTER — Ambulatory Visit (HOSPITAL_COMMUNITY): Payer: Medicare Other

## 2017-11-01 ENCOUNTER — Other Ambulatory Visit: Payer: Self-pay | Admitting: Endocrinology

## 2017-11-15 ENCOUNTER — Other Ambulatory Visit: Payer: Self-pay | Admitting: Family Medicine

## 2017-11-22 ENCOUNTER — Other Ambulatory Visit: Payer: Self-pay | Admitting: Endocrinology

## 2017-11-22 ENCOUNTER — Ambulatory Visit: Payer: Medicare Other | Admitting: Endocrinology

## 2017-11-29 ENCOUNTER — Encounter: Payer: Self-pay | Admitting: Endocrinology

## 2017-11-29 ENCOUNTER — Ambulatory Visit (INDEPENDENT_AMBULATORY_CARE_PROVIDER_SITE_OTHER): Payer: Medicare Other | Admitting: Endocrinology

## 2017-11-29 VITALS — BP 148/84 | HR 103 | Wt 202.0 lb

## 2017-11-29 DIAGNOSIS — E1165 Type 2 diabetes mellitus with hyperglycemia: Secondary | ICD-10-CM

## 2017-11-29 DIAGNOSIS — Z794 Long term (current) use of insulin: Secondary | ICD-10-CM

## 2017-11-29 DIAGNOSIS — E063 Autoimmune thyroiditis: Secondary | ICD-10-CM | POA: Diagnosis not present

## 2017-11-29 LAB — TSH: TSH: 0.74 u[IU]/mL (ref 0.35–4.50)

## 2017-11-29 LAB — COMPREHENSIVE METABOLIC PANEL
ALT: 21 U/L (ref 0–35)
AST: 20 U/L (ref 0–37)
Albumin: 4.2 g/dL (ref 3.5–5.2)
Alkaline Phosphatase: 118 U/L — ABNORMAL HIGH (ref 39–117)
BUN: 27 mg/dL — ABNORMAL HIGH (ref 6–23)
CALCIUM: 10.1 mg/dL (ref 8.4–10.5)
CHLORIDE: 101 meq/L (ref 96–112)
CO2: 31 meq/L (ref 19–32)
Creatinine, Ser: 0.89 mg/dL (ref 0.40–1.20)
GFR: 68.01 mL/min (ref >60.00)
GLUCOSE: 168 mg/dL — AB (ref 70–99)
POTASSIUM: 3.9 meq/L (ref 3.5–5.1)
Sodium: 139 mEq/L (ref 135–145)
Total Bilirubin: 0.5 mg/dL (ref 0.2–1.2)
Total Protein: 7.6 g/dL (ref 6.0–8.3)

## 2017-11-29 LAB — URINALYSIS, ROUTINE W REFLEX MICROSCOPIC
BILIRUBIN URINE: NEGATIVE
Hgb urine dipstick: NEGATIVE
KETONES UR: NEGATIVE
NITRITE: NEGATIVE
Specific Gravity, Urine: 1.025 (ref 1.000–1.030)
Total Protein, Urine: NEGATIVE
Urine Glucose: 100 — AB
Urobilinogen, UA: 1 (ref 0.0–1.0)
pH: 6 (ref 5.0–8.0)

## 2017-11-29 LAB — POCT GLYCOSYLATED HEMOGLOBIN (HGB A1C): Hemoglobin A1C: 8.9

## 2017-11-29 LAB — MICROALBUMIN / CREATININE URINE RATIO
Creatinine,U: 87.7 mg/dL
MICROALB UR: 2.7 mg/dL — AB (ref 0.0–1.9)
MICROALB/CREAT RATIO: 3 mg/g (ref 0.0–30.0)

## 2017-11-29 LAB — T4, FREE: FREE T4: 0.95 ng/dL (ref 0.60–1.60)

## 2017-11-29 NOTE — Progress Notes (Signed)
Patient ID: Yesenia Wilson, female   DOB: 04-17-1956, 62 y.o.   MRN: [id number]            Reason for Appointment: Follow-up for Type 2 Diabetes   History of Present Illness:          Date of diagnosis of type 2 diabetes mellitus: 2002       Background history:   She had been on metformin for several years with fair control Her blood sugars started increasing in 2016 with A1c over 7% Blood sugars were markedly increased with glucose of 440 in 2/17 and she may not have been regular with her metformin at that time.  She was given Amaryl also but subsequently blood sugars were continuing to be high She was tried on Trulicity in April 9767 but apparently this did not help her blood sugars  Recent history:  .  INSULIN regimen is: [place] 50 units daily Novolog Insulin: 10-10 units before AM meals  Non-insulin hypoglycemic drugs the patient is taking are: Metformin 1 g twice a day, Ozempic 0.5 mg weekly  A1c is back to 8.9, previously was higher at 10.7  She has not been seen in follow-up since 06/2017  Current management, blood sugar patterns and problems identified:  Her blood sugars are relatively better compared to her last visit in October when she was having persistently high readings even fasting  She is now trying to improve her diet and cutting back on portions and carbohydrates especially at breakfast  However her blood sugars are still overall high at all times  Highest blood sugars are probably after lunch and dinner but she is checking readings mostly before meals  She was previously supposed to take 15 units of NovoLog with every meal but she is mostly taking 10 units, she thinks she is supposed to take insulin before eating based on what her blood sugar is and not based on her intake  She is currently not taking any NovoLog at breakfast; however her lunchtime sugars are excellent since she is eating minimal carbohydrate in the morning usually  Also not taking enough  readings after supper  She is taking Ozempic regularly and appears to be benefiting from this with better control of her portions  However continues to gain weight, has gained 6 pounds since her visit in October However she is checking her blood sugars more frequently than before   Side effects from medications have been: None  Compliance with the medical regimen: Inadequate  Hypoglycemia: None  Glucose monitoring:  done 2-3 times a day         Glucometer: One Touch.      Blood Glucose readings by time of day and averages from meter download:  Mean values apply above for all meters except median for One Touch  PRE-MEAL Fasting Lunch Dinner Bedtime Overall  Glucose range:  100-254   114-223  213-319   Mean/median: 131  137  190   159   POST-MEAL PC Breakfast PC Lunch PC Dinner  Glucose range:     Mean/median:   174      Self-care: The diet that the patient has been following is: tries to limit Carbs.     Typical meal intake: Breakfast is Boiled eggs, tomato.  Lunch is usually [place] sandwich with fruit, sometimes salad.  Evening meal is salad or soup                Dietician visit, most recent: Years ago  Exercise: Unable to do much  Weight history:  Wt Readings from Last 3 Encounters:  11/29/17 202 lb (91.6 kg)  10/10/17 198 lb (89.8 kg)  09/12/17 192 lb 3.2 oz (87.2 kg)    Glycemic control:     Lab Results  Component Value Date   HGBA1C 8.9 11/29/2017   HGBA1C 10.7 07/05/2017   HGBA1C 8.9 02/23/2017   Lab Results  Component Value Date   MICROALBUR 3.0 (H) 08/03/2016   LDLCALC 109 (H) 10/07/2014   CREATININE 1.08 07/05/2017   Lab Results  Component Value Date   MICRALBCREAT 2.6 08/03/2016    Other active problems: See review of systems   Allergies as of 11/29/2017      Reactions   Oxycodone-acetaminophen    REACTION: GI upset   Propoxyphene N-acetaminophen    REACTION: nausea, rapid heartrate      Medication List        Accurate  as of 11/29/17  1:30 PM. Always use your most recent med list.          ARIPiprazole 2 MG tablet Commonly known as:  ABILIFY TAKE 1 TABLET (2 MG TOTAL) BY MOUTH AT BEDTIME.   aspirin 81 MG tablet Take 81 mg by mouth daily.   BD PEN NEEDLE NANO U/F 32G X 4 MM Misc Generic drug:  Insulin Pen Needle USE 3 PER TO DAY TO INJECT NOVOLOG   Insulin Pen Needle 32G X 4 MM Misc Commonly known as:  BD PEN NEEDLE NANO U/F Use as directed to inject insulin 4 times daily.   DULoxetine 60 MG capsule Commonly known as:  CYMBALTA TAKE 1 CAPSULE (60 MG TOTAL) BY MOUTH DAILY.   esomeprazole 40 MG capsule Commonly known as:  NEXIUM TAKE ONE CAPSULE BY MOUTH EVERY DAY BEFORE BREAKFAST   glucose blood test strip Commonly known as:  ONETOUCH VERIO USE TO TEST BLOOD SUGAR 4 TIMES A DAY DX CODE E11.9   hydrochlorothiazide 12.5 MG tablet Commonly known as:  HYDRODIURIL Take 12.5 mg by mouth daily.   irbesartan-hydrochlorothiazide 300-12.5 MG tablet Commonly known as:  AVALIDE Take by mouth.   levothyroxine 137 MCG tablet Commonly known as:  SYNTHROID TAKE 1 CAPSULE (137 MCG TOTAL) BY MOUTH DAILY BEFORE BREAKFAST.   losartan 50 MG tablet Commonly known as:  COZAAR Take 1 tablet (50 mg total) by mouth daily.   metFORMIN 1000 MG tablet Commonly known as:  GLUCOPHAGE TAKE 1 TABLET (1,000 MG TOTAL) BY MOUTH 2 (TWO) TIMES DAILY.   naproxen sodium 220 MG tablet Commonly known as:  ALEVE Take 220 mg by mouth.   NOVOLOG FLEXPEN 100 UNIT/ML FlexPen Generic drug:  insulin aspart 10 UNITS BEFORE MAIN MEAL DAILY. IF SUGARS >180,TAKE WITH OTHER MEAL(S) ALSO. ADD 15-20U IF NEEDED   ONETOUCH DELICA LANCETS FINE Misc Use to check blood sugar 4 times per day. Dx Code E11.9   pantoprazole 40 MG tablet Commonly known as:  PROTONIX Take 1 tablet (40 mg total) by mouth 2 (two) times daily.   rosuvastatin 20 MG tablet Commonly known as:  CRESTOR TAKE 1 TABLET (20 MG TOTAL) BY MOUTH DAILY.     Semaglutide 0.25 or 0.5 MG/DOSE Sopn Commonly known as:  OZEMPIC Inject 0.5 mg into the skin once a week.   TRESIBA FLEXTOUCH 200 UNIT/ML Sopn Generic drug:  Insulin Degludec INJECT 60 UNITS INTO THE SKIN 3 (THREE) TIMES DAILY.   valACYclovir 1000 MG tablet Commonly known as:  VALTREX TAKE 2 AT ONSET OF COLD SORE  AND REPEAT 2 IN 12 HOURS.       Allergies:  Allergies  Allergen Reactions  . Oxycodone-Acetaminophen     REACTION: GI upset  . Propoxyphene N-Acetaminophen     REACTION: nausea, rapid heartrate    Past Medical History:  Diagnosis Date  . Anxiety   . Asthma   . Clotting disorder ([place])    DVT left leg between ankle and knee due to tennis injury  . COPD (chronic obstructive pulmonary disease) ([place])   . Depression   . Diabetes mellitus   . DVT (deep venous thrombosis) ([facility])    left leg  . Fibromyalgia   . GERD (gastroesophageal reflux disease)   . Heart murmur   . Hiatal hernia   . Hyperlipidemia   . Hypertension   . Hypothyroidism   . Lupus   . Neuromuscular disorder ([facility])    fibromyalgia  . Pancreatitis   . Pneumonia     Past Surgical History:  Procedure Laterality Date  . APPENDECTOMY    . bladder tack    . CESAREAN SECTION    . CHOLECYSTECTOMY    . COLONOSCOPY     12 yrs ago  . LEFT HEART CATH AND CORONARY ANGIOGRAPHY N/A 11/22/2016   Procedure: Left Heart Cath and Coronary Angiography;  Surgeon: Burnell Blanks, MD;  Location: [facility];  Service: Cardiovascular;  Laterality: N/A;  . SEPTOPLASTY      Family History  Adopted: Yes  Problem Relation Age of Onset  . Diabetes Mellitus II Mother   . Colon polyps Mother   . Peptic Ulcer Mother   . CAD Father   . Diabetes Mellitus II Father   . CAD Sister   . Diabetes Mellitus II Sister   . CAD Brother   . Diabetes Mellitus II Brother   . Breast cancer Maternal Grandmother   . Stomach cancer Maternal Grandmother   . Colon cancer Maternal Grandmother   . Cirrhosis Cousin         x 2  . Esophageal cancer Neg Hx   . Rectal cancer Neg Hx     Social History:  reports that  has never smoked. she has never used smokeless tobacco. She reports that she drinks alcohol. She reports that she does not use drugs.   Review of Systems     Lipid history: She has been on Crestor for hyperlipidemia.     Lab Results  Component Value Date   CHOL 172 07/05/2017   HDL 42.70 07/05/2017   LDLCALC 109 (H) 10/07/2014   LDLDIRECT 104.0 07/05/2017   TRIG 232.0 (H) 07/05/2017   CHOLHDL 4 07/05/2017           Hypertension:   She is currently on low-dose 50 mg losartan  She is anxious today and blood pressure is higher, followed by PCP usually  BP Readings from Last 3 Encounters:  11/29/17 (!) 148/84  10/10/17 130/80  09/12/17 140/70     Most recent eye exam was 5/17   Most recent foot exam:10/17  HYPOTHYROIDISM: She thinks she has been hypothyroid since age of 45 and at that time she was having fatigue and lethargy. Her dosage was increased in 06/2017 but has not had a follow-up She does not feel as tired recently with her dose of 137 mcg       Lab Results  Component Value Date   TSH 7.88 (H) 07/05/2017   TSH 0.50 02/23/2017   TSH 2.90 07/05/2016   FREET4  1.33 10/01/2009    Depression: She has been continued on Cymbalta, followed by PCP     Physical Examination:  BP (!) 148/84 (BP Location: Left Arm, Patient Position: Sitting, Cuff Size: Large)   Pulse (!) 103   Wt 202 lb (91.6 kg)   SpO2 95%   BMI 34.67 kg/m      ASSESSMENT:  Diabetes type 2, uncontrolled   See history of present illness for detailed discussion of current diabetes management, blood sugar patterns and problems identified   She continues to be requiring basal bolus insulin regimen for her hyperglycemia However taking in adequate doses of NovoLog since her blood sugars are mostly high postprandially after lunch and dinner based on her portions and carbohydrate  intake Today discussed in detail the use mealtime insulin to cover postprandial spikes, action of mealtime insulin, timing and action of the rapid acting insulin as well as starting dose and dosage titration to target the two-hour reading of under 180 3 Her fasting readings are better controlled although not consistent with current dose of 50 units [place]  She does need weight loss which is difficult to receive especially with requiring larger doses of insulin despite taking Ozempic She is concerned about the cost of Jardiance which was previously considered   PLAN for diabetes:    She will need to take NovoLog regardless of her pre-meal blood sugar, however she will not need to take it at breakfast on that she is eating much, 100  She will take at least 15 units with lunch and dinner based on her portions  She will check blood sugars after evening meal more consistently to assess adequacy of her dose  No change in to see by 0  Continue Ozempic weekly  Encouraged her to be more active since she has tenderness to weight gain  Although she may benefit from Invokana or Jardiance especially with weight management and better blood pressure control she does not want to take this now  Reminded her to keep her appointments regularly  Hypertension: Blood pressure is generally better controlled  Hypothyroidism: We will have labs done today for follow-up on her new dose of 137 mcg  There are no Patient Instructions on file for this visit.  Counseling time on subjects discussed in assessment and plan sections is over 50% of today's 25 minute visit   Elayne Snare 11/29/2017, 1:30 PM   Note: This office note was prepared with Dragon voice recognition system technology. Any transcriptional errors that result from this process are unintentional.

## 2017-11-29 NOTE — Patient Instructions (Addendum)
Check blood sugars on waking up  4/7 days  Also check blood sugars about 2 hours after a meal and do this after different meals by rotation  Recommended blood sugar levels on waking up is 90-130 and about 2 hours after meal is 130-160  Please bring your blood sugar monitor to each visit, thank you  Must take 15 Novolog at  [facility] and supper

## 2017-12-05 ENCOUNTER — Other Ambulatory Visit: Payer: Self-pay | Admitting: Endocrinology

## 2017-12-15 ENCOUNTER — Other Ambulatory Visit: Payer: Self-pay | Admitting: Gastroenterology

## 2017-12-15 IMAGING — CT CT ABD-PELV W/ CM
2 of 5 series · 16 of 46 positions shown, 18 images · IV contrast (Omni 300)
Comparison: MRI of the lumbar spine March 02, 2015

CLINICAL DATA: Epigastric and mid abdominal pain for 3 days, nausea
and vomiting. History of lupus, hypertension, hyperlipidemia,
diabetes, pancreatitis.

EXAM:
CT ABDOMEN AND PELVIS WITH CONTRAST
TECHNIQUE: Multidetector CT imaging of the abdomen and pelvis was performed
using the standard protocol following bolus administration of
intravenous contrast.
CONTRAST:  100mL 78AFQY-SSS IOPAMIDOL (78AFQY-SSS) INJECTION 61%

[Series 3: a/p w/ 5mm · axial · 0.68mm/px · z∈[+765,+1180]mm · 13 of 93 slices shown, 15 images]
[im 5/93  soft-tissue]
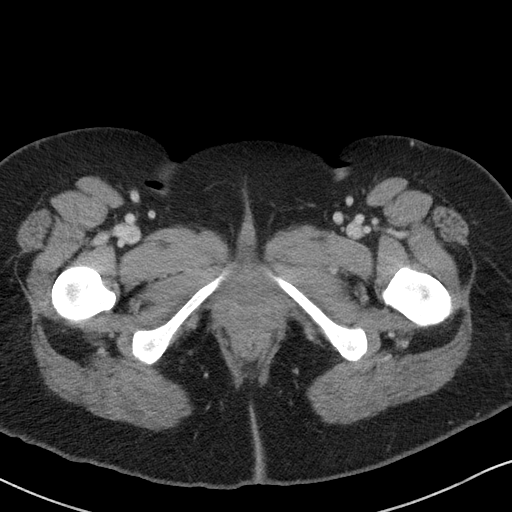
[im 5/93  bone]
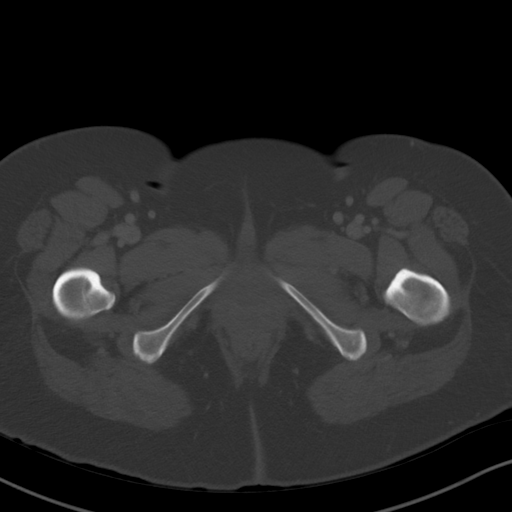
[im 15/93  soft-tissue]
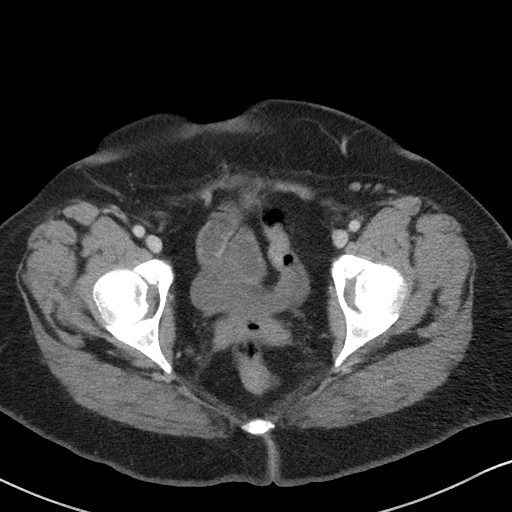
[im 20/93  soft-tissue]
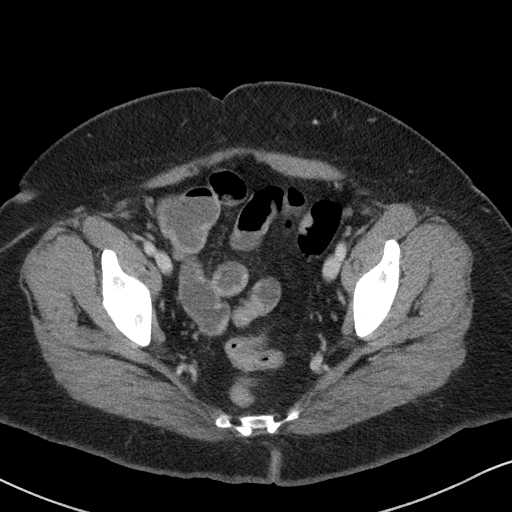
[im 25/93  soft-tissue]
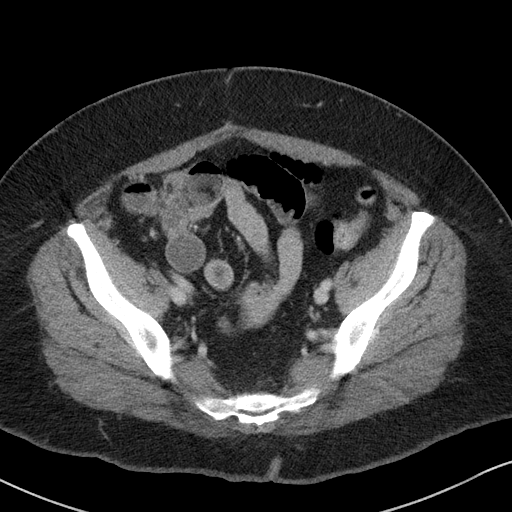
[im 34/93  soft-tissue]
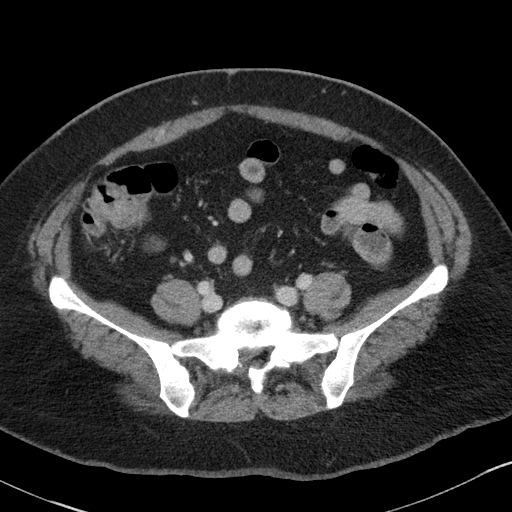
[im 39/93  soft-tissue]
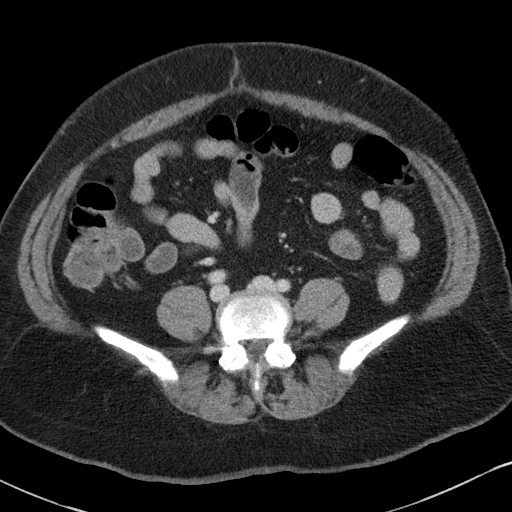
[im 49/93  soft-tissue]
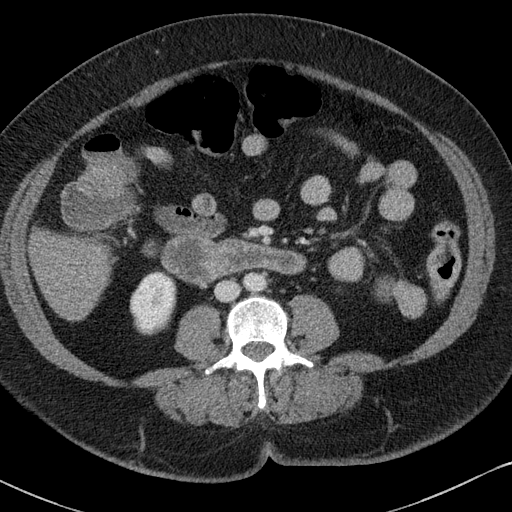
[im 54/93  soft-tissue]
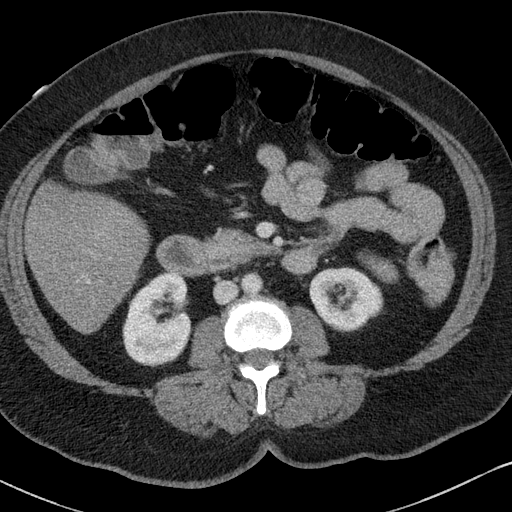
[im 59/93  soft-tissue]
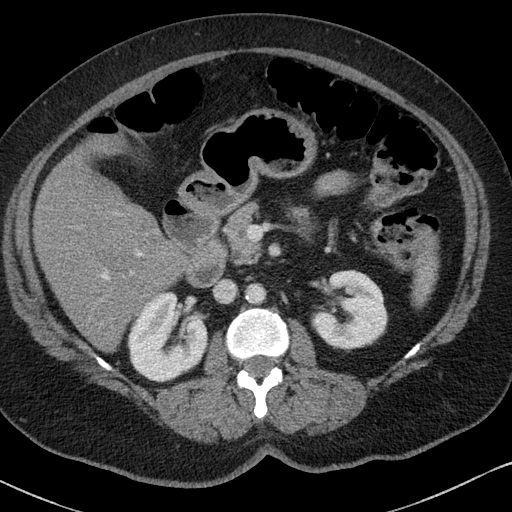
[im 59/93  bone]
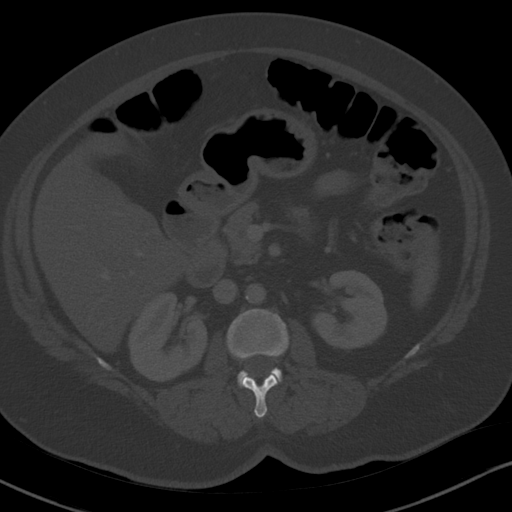
[im 68/93  soft-tissue]
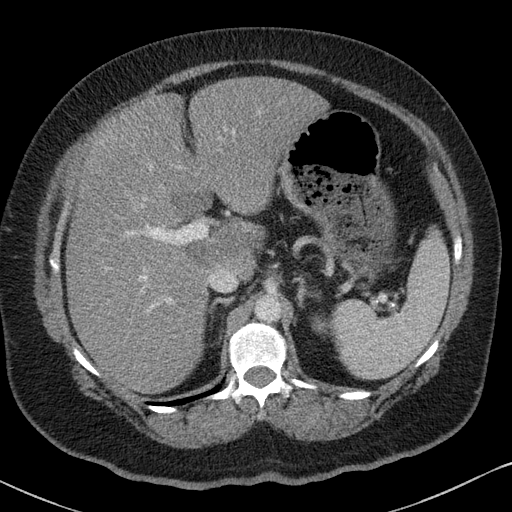
[im 73/93  soft-tissue]
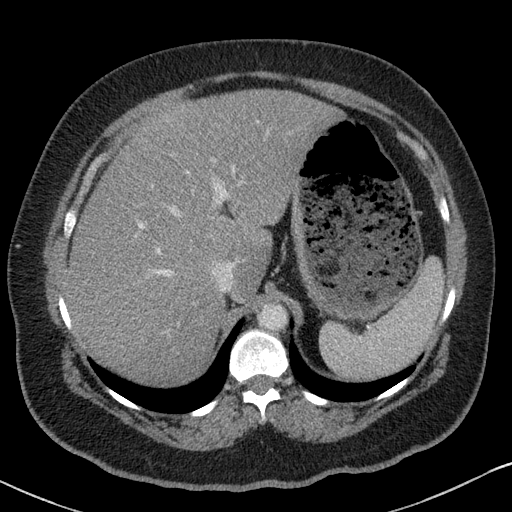
[im 78/93  soft-tissue]
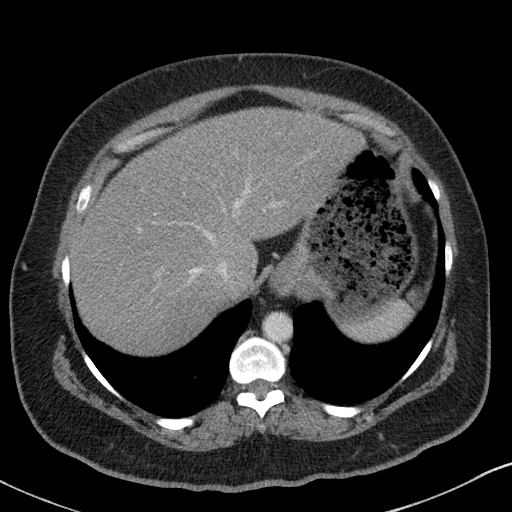
[im 88/93  soft-tissue]
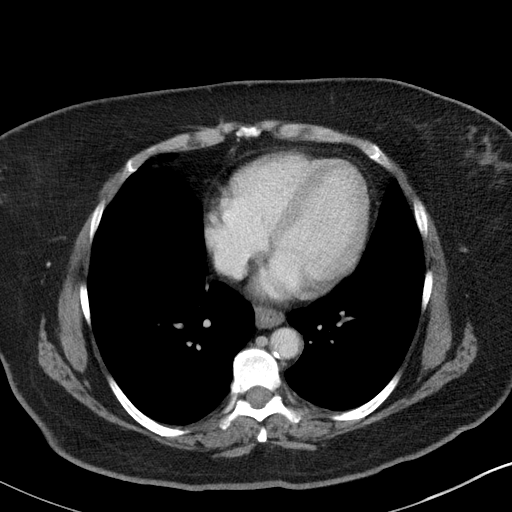

[Series 6: a/p w/ cor · coronal · 0.73mm/px · 3 of 151 slices shown]
[im 51/151  soft-tissue]
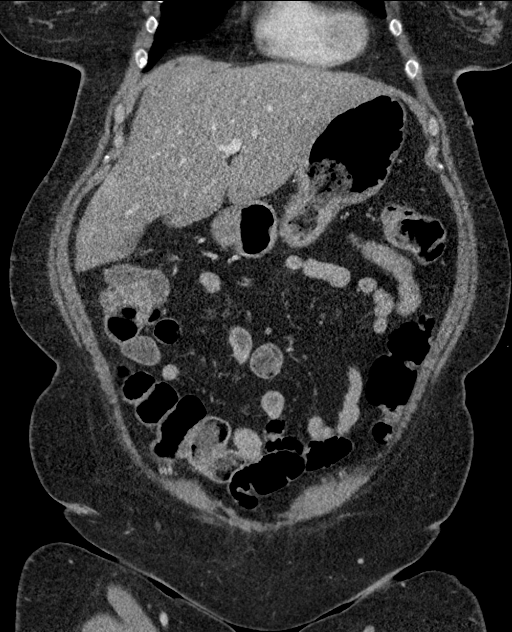
[im 67/151  soft-tissue]
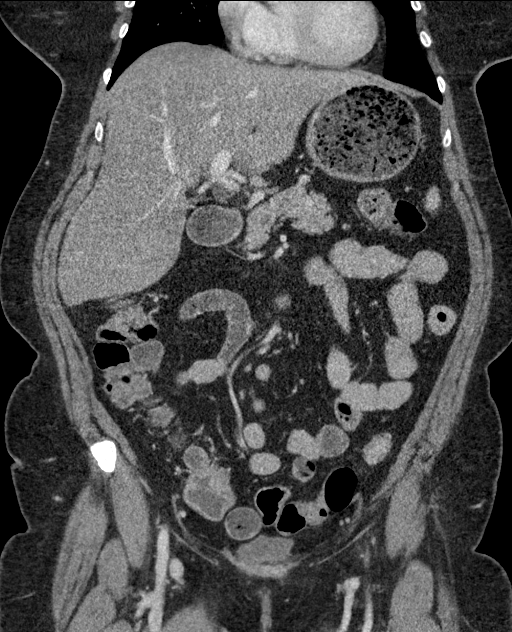
[im 84/151  soft-tissue]
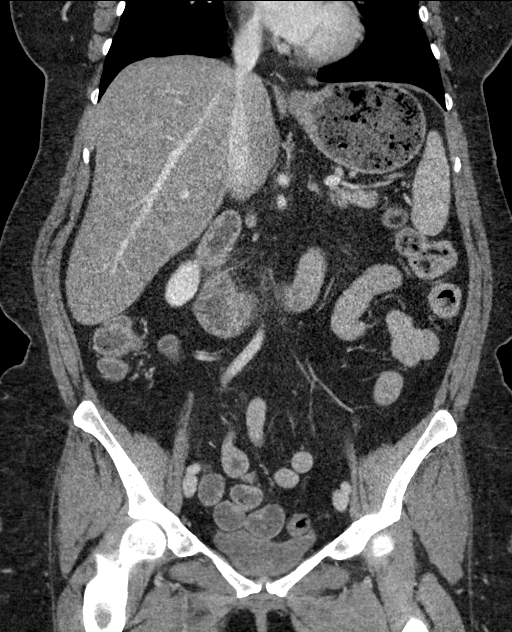

[16 of 46 positions shown; findings below may reference images not displayed]

FINDINGS: LOWER CHEST: Lung bases are clear. Included heart size is normal. No
pericardial effusion. Small fat containing paraesophageal hernia.

HEPATOBILIARY: Status post cholecystectomy. The liver is diffusely
hypodense compatible with steatosis with focal fatty infiltration
about the porta hepatis. Mild hepatomegaly.

PANCREAS: Normal.

SPLEEN: Normal.

ADRENALS/URINARY TRACT: Kidneys are orthotopic, demonstrating
symmetric enhancement. No nephrolithiasis, hydronephrosis or solid
renal masses. Too small to characterize lower pole LEFT kidney. Too
small to characterize hypodensity LEFT interpolar kidney. The
unopacified ureters are normal in course and caliber. Delayed
imaging through the kidneys demonstrates symmetric prompt contrast
excretion within the proximal urinary collecting system. Urinary
bladder is partially distended and unremarkable. Normal adrenal
glands.

STOMACH/BOWEL: The stomach, small and large bowel are normal in
course and caliber without inflammatory changes. Mild sigmoid
diverticulosis. Normal appendix.

VASCULAR/LYMPHATIC: Aortoiliac vessels are normal in course and
caliber, trace calcific atherosclerosis. No lymphadenopathy by CT
size criteria.

REPRODUCTIVE: Status post hysterectomy.

OTHER: No intraperitoneal free fluid or free air.

MUSCULOSKELETAL: Nonacute. Moderate sacroiliac osteoarthrosis. Large
L5-S1 disc protrusion versus extrusion better characterized on prior
MRI of lumbar spine.
IMPRESSION: No acute intra-abdominal or pelvic process.

Severe hepatic steatosis and mild hepatomegaly.

## 2017-12-16 ENCOUNTER — Other Ambulatory Visit: Payer: Self-pay | Admitting: Family Medicine

## 2018-01-03 ENCOUNTER — Other Ambulatory Visit: Payer: Self-pay | Admitting: Endocrinology

## 2018-01-09 ENCOUNTER — Ambulatory Visit: Payer: Medicare Other | Admitting: Family Medicine

## 2018-01-10 ENCOUNTER — Telehealth: Payer: Self-pay | Admitting: Endocrinology

## 2018-01-10 ENCOUNTER — Other Ambulatory Visit: Payer: Self-pay

## 2018-01-10 MED ORDER — GLUCOSE BLOOD VI STRP: ORAL_STRIP | 4 refills | Status: DC

## 2018-01-10 MED ORDER — ONETOUCH DELICA LANCETS 33G MISC: 2 refills | Status: DC

## 2018-01-10 NOTE — Telephone Encounter (Signed)
Medication sent to pharmacy per pt request with new MD changes.

## 2018-01-10 NOTE — Telephone Encounter (Signed)
Okay to change to 3 times daily?

## 2018-01-10 NOTE — Telephone Encounter (Signed)
Okay to change to 3 times daily

## 2018-01-10 NOTE — Telephone Encounter (Signed)
ONETOUCH DELICA LANCETS 64V MISC   Patient stated insurance will not cover if it says she test 4 times daily. The prescription needs to stated she checks 3 times daily  Patient has been using the same lancets for a week now in order to test    [id number] - [place], [place] [address] [place]

## 2018-01-14 ENCOUNTER — Other Ambulatory Visit: Payer: Self-pay | Admitting: Family Medicine

## 2018-01-15 ENCOUNTER — Other Ambulatory Visit: Payer: Self-pay

## 2018-01-18 DIAGNOSIS — H60333 Swimmer's ear, bilateral: Secondary | ICD-10-CM | POA: Diagnosis not present

## 2018-01-18 DIAGNOSIS — H6123 Impacted cerumen, bilateral: Secondary | ICD-10-CM | POA: Diagnosis not present

## 2018-01-24 ENCOUNTER — Emergency Department (HOSPITAL_COMMUNITY)
Admission: EM | Admit: 2018-01-24 | Discharge: 2018-01-24 | Disposition: A | Discharge status: Home / Self Care | Payer: Medicare Other | Attending: Emergency Medicine | Admitting: Emergency Medicine

## 2018-01-24 ENCOUNTER — Ambulatory Visit: Payer: Self-pay

## 2018-01-24 ENCOUNTER — Emergency Department (HOSPITAL_COMMUNITY): Payer: Medicare Other

## 2018-01-24 ENCOUNTER — Encounter (HOSPITAL_COMMUNITY): Payer: Self-pay | Admitting: Family Medicine

## 2018-01-24 DIAGNOSIS — Z5321 Procedure and treatment not carried out due to patient leaving prior to being seen by health care provider: Secondary | ICD-10-CM | POA: Insufficient documentation

## 2018-01-24 DIAGNOSIS — R079 Chest pain, unspecified: Secondary | ICD-10-CM | POA: Insufficient documentation

## 2018-01-24 LAB — BASIC METABOLIC PANEL
ANION GAP: 13 (ref 5–15)
BUN: 22 mg/dL — ABNORMAL HIGH (ref 6–20)
CHLORIDE: 102 mmol/L (ref 101–111)
CO2: 24 mmol/L (ref 22–32)
Calcium: 10 mg/dL (ref 8.9–10.3)
Creatinine, Ser: 0.84 mg/dL (ref 0.44–1.00)
GFR calc Af Amer: >60 mL/min (ref >60)
GFR calc non Af Amer: >60 mL/min (ref >60)
Glucose, Bld: 253 mg/dL — ABNORMAL HIGH (ref 65–99)
Potassium: 4.1 mmol/L (ref 3.5–5.1)
Sodium: 139 mmol/L (ref 135–145)

## 2018-01-24 LAB — CBC
HCT: 42 % (ref 36.0–46.0)
HEMOGLOBIN: 13.7 g/dL (ref 12.0–15.0)
MCH: 26.9 pg (ref 26.0–34.0)
MCHC: 32.6 g/dL (ref 30.0–36.0)
MCV: 82.4 fL (ref 78.0–100.0)
Platelets: 381 10*3/uL (ref 150–400)
RBC: 5.1 MIL/uL (ref 3.87–5.11)
RDW: 14.5 % (ref 11.5–15.5)
WBC: 11.3 10*3/uL — ABNORMAL HIGH (ref 4.0–10.5)

## 2018-01-24 LAB — I-STAT TROPONIN, ED: TROPONIN I, POC: 0 ng/mL (ref 0.00–0.08)

## 2018-01-24 NOTE — Telephone Encounter (Signed)
Pt. Reports she was up last last night with chest pain, "bad headache", nausea and shortness of breath. When she got up this morning she has blurred vision. Chest pain is "gone." Blood sugar was "over 300 this morning." "I just don't feel right." Instructed to go to ED for evaluation. States she will have her daughter take her.  Reason for Disposition . Patient sounds very sick or weak to the triager  Answer Assessment - Initial Assessment Questions 1. LOCATION: "Where does it hurt?"       Middle 2. RADIATION: "Does the pain go anywhere else?" (e.g., into neck, jaw, arms, back)     No 3. ONSET: "When did the chest pain begin?" (Minutes, hours or days)      Last night 4. PATTERN "Does the pain come and go, or has it been constant since it started?"  "Does it get worse with exertion?"      Pain is gone now 5. DURATION: "How long does it last" (e.g., seconds, minutes, hours)     Few minutes 6. SEVERITY: "How bad is the pain?"  (e.g., Scale 1-10; mild, moderate, or severe)    - MILD (1-3): doesn't interfere with normal activities     - MODERATE (4-7): interferes with normal activities or awakens from sleep    - SEVERE (8-10): excruciating pain, unable to do any normal activities       7 7. CARDIAC RISK FACTORS: "Do you have any history of heart problems or risk factors for heart disease?" (e.g., prior heart attack, angina; high blood pressure, diabetes, being overweight, high cholesterol, smoking, or strong family history of heart disease)     Diabetes,HTN, Elevated cholesterol, strong family history 8. PULMONARY RISK FACTORS: "Do you have any history of lung disease?"  (e.g., blood clots in lung, asthma, emphysema, birth control pills)     Asthma 9. CAUSE: "What do you think is causing the chest pain?"     Unsure 10. OTHER SYMPTOMS: "Do you have any other symptoms?" (e.g., dizziness, nausea, vomiting, sweating, fever, difficulty breathing, cough)       Headache, blurred vision,shortness of  breath 11. PREGNANCY: "Is there any chance you are pregnant?" "When was your last menstrual period?"       No  Protocols used: CHEST PAIN-A-AH

## 2018-01-24 NOTE — ED Notes (Signed)
Pt returned her stickers to registration and stated that she was leaving

## 2018-01-24 NOTE — Telephone Encounter (Signed)
Confirmed patient has arrived in the ER now. 

## 2018-01-24 NOTE — Telephone Encounter (Signed)
Monitor for ER arrival 

## 2018-01-24 NOTE — ED Notes (Signed)
Pt stated, "if I am not seen in a hour I am going home."

## 2018-02-05 ENCOUNTER — Other Ambulatory Visit: Payer: Self-pay | Admitting: Family Medicine

## 2018-02-05 ENCOUNTER — Other Ambulatory Visit: Payer: Self-pay | Admitting: Endocrinology

## 2018-02-06 NOTE — Telephone Encounter (Signed)
Denied.  Any knowledge why refill requested?

## 2018-02-08 DIAGNOSIS — H40033 Anatomical narrow angle, bilateral: Secondary | ICD-10-CM | POA: Diagnosis not present

## 2018-02-08 DIAGNOSIS — E119 Type 2 diabetes mellitus without complications: Secondary | ICD-10-CM | POA: Diagnosis not present

## 2018-02-27 DIAGNOSIS — M797 Fibromyalgia: Secondary | ICD-10-CM | POA: Diagnosis not present

## 2018-02-27 DIAGNOSIS — Z79899 Other long term (current) drug therapy: Secondary | ICD-10-CM | POA: Diagnosis not present

## 2018-02-27 DIAGNOSIS — J449 Chronic obstructive pulmonary disease, unspecified: Secondary | ICD-10-CM | POA: Diagnosis not present

## 2018-02-27 DIAGNOSIS — Z794 Long term (current) use of insulin: Secondary | ICD-10-CM | POA: Diagnosis not present

## 2018-02-27 DIAGNOSIS — M1990 Unspecified osteoarthritis, unspecified site: Secondary | ICD-10-CM | POA: Diagnosis not present

## 2018-02-27 DIAGNOSIS — R109 Unspecified abdominal pain: Secondary | ICD-10-CM | POA: Diagnosis not present

## 2018-02-27 DIAGNOSIS — K219 Gastro-esophageal reflux disease without esophagitis: Secondary | ICD-10-CM | POA: Diagnosis not present

## 2018-02-27 DIAGNOSIS — E039 Hypothyroidism, unspecified: Secondary | ICD-10-CM | POA: Diagnosis not present

## 2018-02-27 DIAGNOSIS — E119 Type 2 diabetes mellitus without complications: Secondary | ICD-10-CM | POA: Diagnosis not present

## 2018-02-27 DIAGNOSIS — Z7982 Long term (current) use of aspirin: Secondary | ICD-10-CM | POA: Diagnosis not present

## 2018-02-27 DIAGNOSIS — R1013 Epigastric pain: Secondary | ICD-10-CM | POA: Diagnosis not present

## 2018-03-01 ENCOUNTER — Encounter: Payer: Self-pay | Admitting: Family Medicine

## 2018-03-01 ENCOUNTER — Ambulatory Visit (INDEPENDENT_AMBULATORY_CARE_PROVIDER_SITE_OTHER): Payer: Medicare Other | Admitting: Family Medicine

## 2018-03-01 VITALS — BP 110/70 | HR 95 | Temp 98.2°F | Wt 206.6 lb

## 2018-03-01 DIAGNOSIS — R1013 Epigastric pain: Secondary | ICD-10-CM | POA: Diagnosis not present

## 2018-03-01 DIAGNOSIS — R1110 Vomiting, unspecified: Secondary | ICD-10-CM | POA: Diagnosis not present

## 2018-03-01 DIAGNOSIS — K219 Gastro-esophageal reflux disease without esophagitis: Secondary | ICD-10-CM

## 2018-03-01 NOTE — Patient Instructions (Signed)
Hold Ozempic for now- and discuss with Dr Dwyane Dee.  Let me know if symptoms not improved in one week.

## 2018-03-01 NOTE — Progress Notes (Signed)
Subjective:     Patient ID: Yesenia Wilson, female   DOB: [birth year], 62 y.o.   MRN: [id number]  HPI Patient is seen with complaints of intermittent vomiting past 2 weeks. She has had some epigastric pain as well. She went to emergency room over in [place] twice now and had CT scan reportedly 2 days ago which showed hiatal hernia was no acute normality. We have no records for review.  She states she is certain that they checked lipase levels and there was no pancreatitis. She's not had any stool changes. No hematemesis. Weight is actually increased.  Patient had somewhat similar symptoms in the past. She had EGD back in April 2018 which was basically normal. She had gastric emptying study March 2018 -no gastroparesis.  No alcohol use. No recent nonsteroidal use. She takes Zofran for nausea symptoms. No clear triggers for nausea and vomiting. She does take GLP-1 medication for her diabetes and is not sure if this is related to her symptoms. She's had previous cholecystectomy  She does have history of GERD and takes Protonix regularly.  Past Medical History:  Diagnosis Date  . Anxiety   . Asthma   . Clotting disorder ([place])    DVT left leg between ankle and knee due to tennis injury  . COPD (chronic obstructive pulmonary disease) ([place])   . Depression   . Diabetes mellitus   . DVT (deep venous thrombosis) ([facility])    left leg  . Fibromyalgia   . GERD (gastroesophageal reflux disease)   . Heart murmur   . Hiatal hernia   . Hyperlipidemia   . Hypertension   . Hypothyroidism   . Lupus ([place])   . Neuromuscular disorder ([facility])    fibromyalgia  . Pancreatitis   . Pneumonia    Past Surgical History:  Procedure Laterality Date  . APPENDECTOMY    . bladder tack    . CESAREAN SECTION    . CHOLECYSTECTOMY    . COLONOSCOPY     12 yrs ago  . LEFT HEART CATH AND CORONARY ANGIOGRAPHY N/A 11/22/2016   Procedure: Left Heart Cath and Coronary Angiography;  Surgeon: Burnell Blanks, MD;   Location: [facility];  Service: Cardiovascular;  Laterality: N/A;  . SEPTOPLASTY      reports that she has never smoked. She has never used smokeless tobacco. She reports that she drinks alcohol. She reports that she does not use drugs. family history includes Breast cancer in her maternal grandmother; CAD in her brother, father, and sister; Cirrhosis in her cousin; Colon cancer in her maternal grandmother; Colon polyps in her mother; Diabetes Mellitus II in her brother, father, mother, and sister; Peptic Ulcer in her mother; Stomach cancer in her maternal grandmother. She was adopted. Allergies  Allergen Reactions  . Oxycodone-Acetaminophen     REACTION: GI upset  . Propoxyphene N-Acetaminophen     REACTION: nausea, rapid heartrate     Review of Systems  Respiratory: Negative for shortness of breath.   Cardiovascular: Negative for chest pain.  Gastrointestinal: Positive for abdominal pain, nausea and vomiting. Negative for abdominal distention, blood in stool, constipation and diarrhea.  Neurological: Negative for dizziness.       Objective:   Physical Exam  Constitutional: She appears well-developed and well-nourished.  Cardiovascular: Normal rate and regular rhythm.  Pulmonary/Chest: Effort normal and breath sounds normal. No stridor. She has no wheezes.  Abdominal: Soft. Bowel sounds are normal.  Mild epigastric tenderness. No masses palpated. No guarding or rebound.  Assessment:     Patient presents with some recent recurrent nausea and vomiting. She reportedly had workup at [facility] in [place] with CT scan and labs unremarkable. S/p cholecystectomy several years ago.  She is on GLP-1 medication but no known history of pancreatitis. She's had previous cholecystectomy. Previous workup did not reveal any gastroparesis.    Plan:     -We recommend that she hold her GLP-1 medication until follow-up with endocrinologist (which is Monday-next week) -She's had fairly  extensive workup GI in the past as above including EGD and gastric emptying study. -Continue Zofran as needed for symptom control -We recommend she eat small bland meals until further evaluation -We elected not to get any labs today since she states she had multiple labs just 2 days ago in ER  Eulas Post MD [facility]

## 2018-03-03 NOTE — Progress Notes (Signed)
Patient ID: Yesenia Wilson, female   DOB: 1956-02-04, 62 y.o.   MRN: [id number]            Reason for Appointment: Follow-up for Type 2 Diabetes   History of Present Illness:          Date of diagnosis of type 2 diabetes mellitus: 2002       Background history:   She had been on metformin for several years with fair control Her blood sugars started increasing in 2016 with A1c over 7% Blood sugars were markedly increased with glucose of 440 in 2/17 and she may not have been regular with her metformin at that time.  She was given Amaryl also but subsequently blood sugars were continuing to be high She was tried on Trulicity in April 0626 but apparently this did not help her blood sugars  Recent history:  .  INSULIN regimen is: Tresiba 180 units daily Novolog Insulin: 16 units before AM meals  Non-insulin hypoglycemic drugs the patient is taking are: Metformin 1 g twice a day, Ozempic 0.5 mg weekly  A1c is higher at 9.8, previous recent range 8.9-10.7  Current management, blood sugar patterns and problems identified:  She did not bring her monitor for download  Although she had been taking Ozempic consistently prior to her visit in March and was able to control her portions better she has not continued to benefit from this   Also she apparently more recently was having periodic problems with vomiting and abdominal pain not related to her pancreatitis or other known issue and her PCP recommended stopping this last week  Her blood sugars are probably higher after meals more consistently by recall  However she may still tend to have high readings in the mornings especially if she is not watching her diet the night before  Also appears to be having some confusion about her Tresiba dose and is taking a large amount of 180 units in divided doses at home  She is however taking only 16 units of NovoLog, previously was only taking 8 to 10 units and was also told to take as much as 20  units before main meal  She thinks her main problem is overeating and she did not benefit from [place] recently   Side effects from medications have been: None  Compliance with the medical regimen: Inadequate  Hypoglycemia: None  Glucose monitoring:  done 2-3 times a day         Glucometer: One Touch.      Blood Glucose readings by recall   PRE-MEAL Fasting Lunch Dinner Bedtime Overall  Glucose range: 125-200   200-300   Mean/median:        POST-MEAL PC Breakfast PC Lunch PC Dinner  Glucose range:  ?   Mean/median:      Previous readings  PRE-MEAL Fasting Lunch Dinner Bedtime Overall  Glucose range:  100-254   114-223  213-319   Mean/median: 131  137  190   159   POST-MEAL PC Breakfast PC Lunch PC Dinner  Glucose range:     Mean/median:   174      Self-care: The diet that the patient has been following is: tries to limit Carbs.     Typical meal intake: Breakfast is Boiled eggs, tomato.  Lunch is usually [place] sandwich with fruit, sometimes salad.  Evening meal is salad or soup               Dietician visit, most recent: Years ago  Exercise: Unable to do much  Weight history:  Wt Readings from Last 3 Encounters:  03/04/18 206 lb 9.6 oz (93.7 kg)  03/01/18 206 lb 9.6 oz (93.7 kg)  01/24/18 200 lb (90.7 kg)    Glycemic control:     Lab Results  Component Value Date   HGBA1C 9.8 (A) 03/04/2018   HGBA1C 8.9 11/29/2017   HGBA1C 10.7 07/05/2017   Lab Results  Component Value Date   MICROALBUR 2.7 (H) 11/29/2017   LDLCALC 109 (H) 10/07/2014   CREATININE 0.84 01/24/2018   Lab Results  Component Value Date   MICRALBCREAT 3.0 11/29/2017    Other active problems: See review of systems   Allergies as of 03/04/2018      Reactions   Oxycodone-acetaminophen    REACTION: GI upset   Propoxyphene N-acetaminophen    REACTION: nausea, rapid heartrate      Medication List        Accurate as of 03/04/18  4:40 PM. Always use your most recent med  list.          aluminum-magnesium hydroxide-simethicone 242-353-61 MG/5ML Susp Commonly known as:  MAALOX Take 30 mLs by mouth 4 (four) times daily -  before meals and at bedtime.   ARIPiprazole 2 MG tablet Commonly known as:  ABILIFY TAKE 1 TABLET (2 MG TOTAL) BY MOUTH AT BEDTIME.   aspirin 81 MG tablet Take 81 mg by mouth daily.   BD PEN NEEDLE NANO U/F 32G X 4 MM Misc Generic drug:  Insulin Pen Needle USE 3 PER TO DAY TO INJECT NOVOLOG   Insulin Pen Needle 32G X 4 MM Misc Commonly known as:  BD PEN NEEDLE NANO U/F Use as directed to inject insulin 4 times daily.   DULoxetine 60 MG capsule Commonly known as:  CYMBALTA TAKE 1 CAPSULE (60 MG TOTAL) BY MOUTH DAILY.   glucose blood test strip Commonly known as:  ONETOUCH VERIO USE TO TEST BLOOD SUGAR 3 TIMES A DAY DX CODE E11.9   hydrochlorothiazide 12.5 MG tablet Commonly known as:  HYDRODIURIL Take 12.5 mg by mouth daily.   irbesartan-hydrochlorothiazide 300-12.5 MG tablet Commonly known as:  AVALIDE Take by mouth.   levothyroxine 137 MCG tablet Commonly known as:  SYNTHROID TAKE 1 CAPSULE (137 MCG TOTAL) BY MOUTH DAILY BEFORE BREAKFAST.   Lorcaserin HCl ER 20 MG Tb24 Commonly known as:  BELVIQ XR Take 20 mg by mouth daily with breakfast.   losartan 50 MG tablet Commonly known as:  COZAAR Take 1 tablet (50 mg total) by mouth daily.   metFORMIN 1000 MG tablet Commonly known as:  GLUCOPHAGE TAKE 1 TABLET (1,000 MG TOTAL) BY MOUTH 2 (TWO) TIMES DAILY.   naproxen sodium 220 MG tablet Commonly known as:  ALEVE Take 220 mg by mouth.   NOVOLOG FLEXPEN 100 UNIT/ML FlexPen Generic drug:  insulin aspart 10 UNITS BEFORE MAIN MEAL DAILY. IF SUGARS >180,TAKE WITH OTHER MEAL(S) ALSO. ADD 15-20U IF NEEDED   ondansetron 4 MG tablet Commonly known as:  ZOFRAN Take 4 mg by mouth every 8 (eight) hours as needed for nausea or vomiting.   ONETOUCH DELICA LANCETS 44R Misc USE TO CHECK BLOOD SUGAR 3 TIMES PER DAY. DX  CODE E11.9   pantoprazole 40 MG tablet Commonly known as:  PROTONIX TAKE 1 TABLET BY MOUTH TWICE A DAY   rosuvastatin 20 MG tablet Commonly known as:  CRESTOR TAKE 1 TABLET (20 MG TOTAL) BY MOUTH DAILY.   TRESIBA FLEXTOUCH 200 UNIT/ML Sopn Generic  drug:  Insulin Degludec INJECT 60 UNITS INTO THE SKIN 3 (THREE) TIMES DAILY.   valACYclovir 1000 MG tablet Commonly known as:  VALTREX TAKE 2 AT ONSET OF COLD SORE AND REPEAT 2 IN 12 HOURS.       Allergies:  Allergies  Allergen Reactions  . Oxycodone-Acetaminophen     REACTION: GI upset  . Propoxyphene N-Acetaminophen     REACTION: nausea, rapid heartrate    Past Medical History:  Diagnosis Date  . Anxiety   . Asthma   . Clotting disorder ([place])    DVT left leg between ankle and knee due to tennis injury  . COPD (chronic obstructive pulmonary disease) ([place])   . Depression   . Diabetes mellitus   . DVT (deep venous thrombosis) ([facility])    left leg  . Fibromyalgia   . GERD (gastroesophageal reflux disease)   . Heart murmur   . Hiatal hernia   . Hyperlipidemia   . Hypertension   . Hypothyroidism   . Lupus ([place])   . Neuromuscular disorder ([facility])    fibromyalgia  . Pancreatitis   . Pneumonia     Past Surgical History:  Procedure Laterality Date  . APPENDECTOMY    . bladder tack    . CESAREAN SECTION    . CHOLECYSTECTOMY    . COLONOSCOPY     12 yrs ago  . LEFT HEART CATH AND CORONARY ANGIOGRAPHY N/A 11/22/2016   Procedure: Left Heart Cath and Coronary Angiography;  Surgeon: Burnell Blanks, MD;  Location: [facility];  Service: Cardiovascular;  Laterality: N/A;  . SEPTOPLASTY      Family History  Adopted: Yes  Problem Relation Age of Onset  . Diabetes Mellitus II Mother   . Colon polyps Mother   . Peptic Ulcer Mother   . CAD Father   . Diabetes Mellitus II Father   . CAD Sister   . Diabetes Mellitus II Sister   . CAD Brother   . Diabetes Mellitus II Brother   . Breast cancer Maternal  Grandmother   . Stomach cancer Maternal Grandmother   . Colon cancer Maternal Grandmother   . Cirrhosis Cousin        x 2  . Esophageal cancer Neg Hx   . Rectal cancer Neg Hx     Social History:  reports that she has never smoked. She has never used smokeless tobacco. She reports that she drinks alcohol. She reports that she does not use drugs.   Review of Systems   Lipid history: She has been on Crestor for hyperlipidemia.  Followed by PCP     Lab Results  Component Value Date   CHOL 172 07/05/2017   HDL 42.70 07/05/2017   LDLCALC 109 (H) 10/07/2014   LDLDIRECT 104.0 07/05/2017   TRIG 232.0 (H) 07/05/2017   CHOLHDL 4 07/05/2017           Hypertension:   She is currently on low-dose 50 mg losartan     BP Readings from Last 3 Encounters:  03/04/18 140/70  03/01/18 110/70  01/24/18 (!) 160/91        HYPOTHYROIDISM: She thinks she has been hypothyroid since age of 77 and at that time she was having fatigue and lethargy. Her dosage was increased in 06/2017 Last TSH was normal   Lab Results  Component Value Date   TSH 0.74 11/29/2017   TSH 7.88 (H) 07/05/2017   TSH 0.50 02/23/2017   FREET4 0.95 11/29/2017   FREET4  1.33 10/01/2009    Depression: She has been continued on Cymbalta, followed by PCP     Physical Examination:  BP 140/70 (BP Location: Left Arm, Patient Position: Sitting, Cuff Size: Normal)   Pulse 70   Ht 5\' 3"  (1.6 m)   Wt 206 lb 9.6 oz (93.7 kg)   SpO2 97%   BMI 36.60 kg/m      ASSESSMENT:  Diabetes type 2, uncontrolled with obesity  See history of present illness for detailed discussion of current diabetes management, blood sugar patterns and problems identified   Her A1c is higher at 9.8  She continues to be requiring large amounts of insulin especially basal Difficult to assess her readings as she is not checking enough and did not bring the monitor today She also does not exercise Currently since she appears to be  intolerant to Ozempic she cannot continue this Previously had been concerned about the cost of Jardiance which may also help  However patient thinks that her main problem is excessive appetite and not controlled with antidepressant that she is taking which is Cymbalta Discussed that she is also needs higher dose of mealtime insulin since of her high readings are postprandial   PLAN for diabetes:    She will need to take NovoLog 20 units at breakfast and supper and 25 at lunch  Will reduce her [place] Aas down to 80 units for now  Trial of Belviq XR 20 mg daily  Given patient information brochure on this and also discussed details of how this is used, benefits, possible side effects  Give written prescription and she can try good Rx to help her with the cost  Needs short-term follow-up  Discussed blood sugar targets at various times but also to call if he is seeing readings consistently out of range  Will not continue Ozempic  Discussed the importance of exercise even though she thinks she is still complaining of fatigue or unable to leave home because of caring for her mother.  She was given a list of indoor exercises to do especially with her wanting to lose weight which would also help her insulin sensitivity   Hypothyroidism: Will need labs on the next visit  Counseling time on subjects discussed in assessment and plan sections is over 50% of today's 25 minute visit   Patient Instructions  Tresiba 80 units once daily  Novolog 20 with Bfst and supper and 25 at lunch  Check blood sugars on waking up  4/7  Also check blood sugars about 2 hours after a meal and do this after different meals by rotation  Recommended blood sugar levels on waking up is 90-130 and about 2 hours after meal is 130-160  Please bring your blood sugar monitor to each visit, thank you  INDOOR EXERCISE IDEAS   Use the following examples for a creative indoor workout (perform each move for 2-3  minutes):   Warm up. Put on some music that makes you feel like moving, and dance around the living room.  Watch exercise shows on TV and move along with them. There are tons of free cable channels that have daily exercise shows on them for all levels - beginner through advanced.   You can easily find a number of exercise videos but use one that will suit your liking and exercise level; you can do these on your own schedule.  Walk up and down the steps.  Do dumbbell curls and presses (if you don't have weights, use full water  bottles).  Do assisted squats, keeping your back on a fitness ball against the wall or using the back of the couch for support.    Fence (you don't even need swords). Pretend you're holding a sword in each hand. Create an X pattern standing still, then moving forward and back.  Hop on your exercise bike or treadmill -- or, for something different, use a weighted hula hoop. If you don't have any of those, just go back to dancing.  Do abdominal crunches (hold a weighted ball for added resistance).  Cool down with [employer] "I Feel Good" -- or whatever tune makes you feel good     Counseling time on subjects discussed in assessment and plan sections is over 50% of today's 25 minute visit   Elayne Snare 03/04/2018, 4:40 PM   Note: This office note was prepared with Dragon voice recognition system technology. Any transcriptional errors that result from this process are unintentional.

## 2018-03-04 ENCOUNTER — Ambulatory Visit (INDEPENDENT_AMBULATORY_CARE_PROVIDER_SITE_OTHER): Payer: Medicare Other | Admitting: Endocrinology

## 2018-03-04 ENCOUNTER — Encounter: Payer: Self-pay | Admitting: Endocrinology

## 2018-03-04 ENCOUNTER — Ambulatory Visit: Payer: Medicare Other | Admitting: Endocrinology

## 2018-03-04 VITALS — BP 140/70 | HR 70 | Ht 63.0 in | Wt 206.6 lb

## 2018-03-04 DIAGNOSIS — E063 Autoimmune thyroiditis: Secondary | ICD-10-CM

## 2018-03-04 DIAGNOSIS — Z794 Long term (current) use of insulin: Secondary | ICD-10-CM

## 2018-03-04 DIAGNOSIS — I10 Essential (primary) hypertension: Secondary | ICD-10-CM

## 2018-03-04 DIAGNOSIS — E1165 Type 2 diabetes mellitus with hyperglycemia: Secondary | ICD-10-CM | POA: Diagnosis not present

## 2018-03-04 LAB — POCT GLYCOSYLATED HEMOGLOBIN (HGB A1C): Hemoglobin A1C: 9.8 % — AB (ref 4.0–5.6)

## 2018-03-04 MED ORDER — LORCASERIN HCL ER 20 MG PO TB24: 20.0000 mg | ORAL_TABLET | Freq: Every day | ORAL | 2 refills | Status: DC

## 2018-03-04 MED ORDER — INSULIN DEGLUDEC 200 UNIT/ML ~~LOC~~ SOPN: 80.0000 [IU] | PEN_INJECTOR | Freq: Every day | SUBCUTANEOUS | 3 refills | Status: DC

## 2018-03-04 NOTE — Patient Instructions (Signed)
Tresiba 80 units once daily  Novolog 20 with Bfst and supper and 25 at lunch  Check blood sugars on waking up  4/7  Also check blood sugars about 2 hours after a meal and do this after different meals by rotation  Recommended blood sugar levels on waking up is 90-130 and about 2 hours after meal is 130-160  Please bring your blood sugar monitor to each visit, thank you  INDOOR EXERCISE IDEAS   Use the following examples for a creative indoor workout (perform each move for 2-3 minutes):   Warm up. Put on some music that makes you feel like moving, and dance around the living room.  Watch exercise shows on TV and move along with them. There are tons of free cable channels that have daily exercise shows on them for all levels - beginner through advanced.   You can easily find a number of exercise videos but use one that will suit your liking and exercise level; you can do these on your own schedule.  Walk up and down the steps.  Do dumbbell curls and presses (if you don't have weights, use full water bottles).  Do assisted squats, keeping your back on a fitness ball against the wall or using the back of the couch for support.    Fence (you don't even need swords). Pretend you're holding a sword in each hand. Create an X pattern standing still, then moving forward and back.  Hop on your exercise bike or treadmill -- or, for something different, use a weighted hula hoop. If you don't have any of those, just go back to dancing.  Do abdominal crunches (hold a weighted ball for added resistance).  Cool down with [employer] "I Feel Good" -- or whatever tune makes you feel good

## 2018-03-04 NOTE — Addendum Note (Signed)
Addended by: Elayne Snare on: 03/04/2018 05:00 PM   Modules accepted: Orders

## 2018-03-19 ENCOUNTER — Telehealth: Payer: Self-pay

## 2018-03-19 NOTE — Telephone Encounter (Signed)
Called Yesenia Wilson to notify her that her insurance is refusing to cover Belviq XR, the medication which Dr. Dwyane Dee has prescribed to her. Yesenia Wilson stated that she already knew and that she has decided to pay out of pocket for the medication and that she has already picked up the first prescription and she will probably continue to pay out of pocket.

## 2018-03-23 ENCOUNTER — Other Ambulatory Visit: Payer: Self-pay | Admitting: Endocrinology

## 2018-03-23 ENCOUNTER — Other Ambulatory Visit: Payer: Self-pay | Admitting: Gastroenterology

## 2018-03-29 ENCOUNTER — Other Ambulatory Visit: Payer: Self-pay | Admitting: Endocrinology

## 2018-04-02 ENCOUNTER — Other Ambulatory Visit: Payer: Self-pay

## 2018-04-02 MED ORDER — LEVOTHYROXINE SODIUM 137 MCG PO TABS: ORAL_TABLET | ORAL | 3 refills | Status: DC

## 2018-04-08 ENCOUNTER — Other Ambulatory Visit: Payer: Self-pay | Admitting: Family Medicine

## 2018-04-20 DIAGNOSIS — R51 Headache: Secondary | ICD-10-CM | POA: Diagnosis not present

## 2018-04-20 DIAGNOSIS — Z9119 Patient's noncompliance with other medical treatment and regimen: Secondary | ICD-10-CM | POA: Diagnosis not present

## 2018-04-20 DIAGNOSIS — Z794 Long term (current) use of insulin: Secondary | ICD-10-CM | POA: Diagnosis not present

## 2018-04-20 DIAGNOSIS — E1165 Type 2 diabetes mellitus with hyperglycemia: Secondary | ICD-10-CM | POA: Diagnosis not present

## 2018-04-20 DIAGNOSIS — J449 Chronic obstructive pulmonary disease, unspecified: Secondary | ICD-10-CM | POA: Diagnosis not present

## 2018-04-20 DIAGNOSIS — Z79899 Other long term (current) drug therapy: Secondary | ICD-10-CM | POA: Diagnosis not present

## 2018-04-20 DIAGNOSIS — Z7982 Long term (current) use of aspirin: Secondary | ICD-10-CM | POA: Diagnosis not present

## 2018-04-20 DIAGNOSIS — Z7951 Long term (current) use of inhaled steroids: Secondary | ICD-10-CM | POA: Diagnosis not present

## 2018-04-20 DIAGNOSIS — I10 Essential (primary) hypertension: Secondary | ICD-10-CM | POA: Diagnosis not present

## 2018-04-20 DIAGNOSIS — Z7984 Long term (current) use of oral hypoglycemic drugs: Secondary | ICD-10-CM | POA: Diagnosis not present

## 2018-04-24 ENCOUNTER — Ambulatory Visit: Payer: Medicare Other | Admitting: Endocrinology

## 2018-04-26 ENCOUNTER — Encounter: Payer: Self-pay | Admitting: Endocrinology

## 2018-04-26 ENCOUNTER — Other Ambulatory Visit: Payer: Self-pay

## 2018-04-26 ENCOUNTER — Telehealth: Payer: Self-pay | Admitting: Family Medicine

## 2018-04-26 ENCOUNTER — Ambulatory Visit (INDEPENDENT_AMBULATORY_CARE_PROVIDER_SITE_OTHER): Payer: Medicare Other | Admitting: Endocrinology

## 2018-04-26 VITALS — BP 154/78 | HR 113 | Ht 63.0 in | Wt 207.0 lb

## 2018-04-26 DIAGNOSIS — E1165 Type 2 diabetes mellitus with hyperglycemia: Secondary | ICD-10-CM | POA: Diagnosis not present

## 2018-04-26 DIAGNOSIS — I10 Essential (primary) hypertension: Secondary | ICD-10-CM

## 2018-04-26 DIAGNOSIS — Z794 Long term (current) use of insulin: Secondary | ICD-10-CM

## 2018-04-26 MED ORDER — ONETOUCH DELICA LANCETS 33G MISC: 2 refills | Status: DC

## 2018-04-26 MED ORDER — EMPAGLIFLOZIN 10 MG PO TABS: 10.0000 mg | ORAL_TABLET | Freq: Every day | ORAL | 3 refills | Status: DC

## 2018-04-26 NOTE — Telephone Encounter (Signed)
Copied from [place] [id number]. Topic: Quick Communication - Rx Refill/Question >> Apr 26, 2018 10:56 AM Burchel, Abbi R wrote: Medication: metoprolol succinate (TOPROL-XL) 50 MG 24 hr tablet  Preferred Pharmacy: [id number] - [place], [place] [address] Phone: [phone] Fax: [phone]  Pt  states she was advised by Dr Dwyane Dee to contact Dr Elease Hashimoto and have him put her back on this medication due to increased HR. Please advise pt.   Pt: [phone]

## 2018-04-26 NOTE — Progress Notes (Signed)
Patient ID: Yesenia Wilson, female   DOB: Aug 24, 1956, 62 y.o.   MRN: [id number]            Reason for Appointment: Follow-up for Type 2 Diabetes   History of Present Illness:          Date of diagnosis of type 2 diabetes mellitus: 2002       Background history:   She had been on metformin for several years with fair control Her blood sugars started increasing in 2016 with A1c over 7% Blood sugars were markedly increased with glucose of 440 in 2/17 and she may not have been regular with her metformin at that time.  She was given Amaryl also but subsequently blood sugars were continuing to be high She was tried on Trulicity in April 7628 but apparently this did not help her blood sugars  Recent history:  .  INSULIN regimen is: [place] 80 units daily Novolog Insulin: 20-25-20 units before AM meals  Non-insulin hypoglycemic drugs the patient is taking are: Metformin 1 g twice a day  A1c in June was higher at 9.8, previous recent range 8.9-10.7  Current management, blood sugar patterns and problems identified:  She did bring her monitor for download which she did not have last time  She had requested a weight loss medication on her previous visit because of her poor eating habits and she did not want to increase her insulin  However [place] was not covered by her insurance and she did not call to report this  More recently her blood sugars have been markedly increased and averaging well over 300  She is still not watching her diet for various reasons  Also still has not lost weight despite hyperglycemia  She may possibly have had nausea and vomiting from [place] and she does not want to try a GLP-1 drug again  She continues to be taking psychotropic drugs including Abilify   Side effects from medications have been: Possible nausea and vomiting from Ozempic  Compliance with the medical regimen: Inadequate  Hypoglycemia: None  Glucose monitoring:  done 2-3 times a day          Glucometer: One Touch.      Blood Glucose readings by download   PRE-MEAL Fasting Lunch Dinner Bedtime Overall  Glucose range:  234-516   324-601  388-567   Mean/median:      356     Self-care: The diet that the patient has been following is: tries to limit Carbs.     Typical meal intake: Breakfast is Boiled eggs, tomato.  Lunch is usually [place] sandwich with fruit, sometimes salad.  Evening meal is salad or soup               Dietician visit, most recent: Years ago               Exercise: Unable to do any significant walking  Weight history:  Wt Readings from Last 3 Encounters:  04/26/18 207 lb (93.9 kg)  03/04/18 206 lb 9.6 oz (93.7 kg)  03/01/18 206 lb 9.6 oz (93.7 kg)    Glycemic control:     Lab Results  Component Value Date   HGBA1C 9.8 (A) 03/04/2018   HGBA1C 8.9 11/29/2017   HGBA1C 10.7 07/05/2017   Lab Results  Component Value Date   MICROALBUR 2.7 (H) 11/29/2017   LDLCALC 109 (H) 10/07/2014   CREATININE 0.84 01/24/2018   Lab Results  Component Value Date   MICRALBCREAT 3.0 11/29/2017  Other active problems: See review of systems   Allergies as of 04/26/2018      Reactions   Oxycodone-acetaminophen    REACTION: GI upset   Propoxyphene N-acetaminophen    REACTION: nausea, rapid heartrate      Medication List        Accurate as of 04/26/18 11:59 PM. Always use your most recent med list.          aluminum-magnesium hydroxide-simethicone 161-096-04 MG/5ML Susp Commonly known as:  MAALOX Take 30 mLs by mouth 4 (four) times daily -  before meals and at bedtime.   ARIPiprazole 2 MG tablet Commonly known as:  ABILIFY TAKE 1 TABLET (2 MG TOTAL) BY MOUTH AT BEDTIME.   aspirin 81 MG tablet Take 81 mg by mouth daily.   BD PEN NEEDLE NANO U/F 32G X 4 MM Misc Generic drug:  Insulin Pen Needle USE 3 PER TO DAY TO INJECT NOVOLOG   Insulin Pen Needle 32G X 4 MM Misc Use as directed to inject insulin 4 times daily.   DULoxetine 60 MG  capsule Commonly known as:  CYMBALTA TAKE 1 CAPSULE (60 MG TOTAL) BY MOUTH DAILY.   empagliflozin 10 MG Tabs tablet Commonly known as:  JARDIANCE Take 10 mg by mouth daily with breakfast.   glucose blood test strip USE TO TEST BLOOD SUGAR 3 TIMES A DAY DX CODE E11.9   hydrochlorothiazide 12.5 MG tablet Commonly known as:  HYDRODIURIL Take 12.5 mg by mouth daily.   Insulin Degludec 200 UNIT/ML Sopn Inject 80 Units into the skin daily.   irbesartan-hydrochlorothiazide 300-12.5 MG tablet Commonly known as:  AVALIDE Take by mouth.   levothyroxine 137 MCG tablet Commonly known as:  SYNTHROID, LEVOTHROID Take 1 tablet by mouth every day before breakfast.   Lorcaserin HCl ER 20 MG Tb24 Take 20 mg by mouth daily with breakfast.   losartan 50 MG tablet Commonly known as:  COZAAR TAKE 1 TABLET BY MOUTH EVERY DAY   metFORMIN 1000 MG tablet Commonly known as:  GLUCOPHAGE TAKE 1 TABLET BY MOUTH TWICE A DAY   naproxen sodium 220 MG tablet Commonly known as:  ALEVE Take 220 mg by mouth.   NOVOLOG FLEXPEN 100 UNIT/ML FlexPen Generic drug:  insulin aspart 10 UNITS BEFORE MAIN MEAL DAILY. IF SUGARS >180,TAKE WITH OTHER MEAL(S) ALSO. ADD 15-20U IF NEEDED   ondansetron 4 MG tablet Commonly known as:  ZOFRAN Take 4 mg by mouth every 8 (eight) hours as needed for nausea or vomiting.   ONETOUCH DELICA LANCETS 54U Misc USE TO CHECK BLOOD SUGAR 3 TIMES PER DAY. DX CODE E11.9   pantoprazole 40 MG tablet Commonly known as:  PROTONIX TAKE 1 TABLET BY MOUTH TWICE A DAY   rosuvastatin 20 MG tablet Commonly known as:  CRESTOR TAKE 1 TABLET (20 MG TOTAL) BY MOUTH DAILY.   valACYclovir 1000 MG tablet Commonly known as:  VALTREX TAKE 2 AT ONSET OF COLD SORE AND REPEAT 2 IN 12 HOURS.       Allergies:  Allergies  Allergen Reactions  . Oxycodone-Acetaminophen     REACTION: GI upset  . Propoxyphene N-Acetaminophen     REACTION: nausea, rapid heartrate    Past Medical History:   Diagnosis Date  . Anxiety   . Asthma   . Clotting disorder ([place])    DVT left leg between ankle and knee due to tennis injury  . COPD (chronic obstructive pulmonary disease) ([place])   . Depression   . Diabetes mellitus   .  DVT (deep venous thrombosis) ([facility])    left leg  . Fibromyalgia   . GERD (gastroesophageal reflux disease)   . Heart murmur   . Hiatal hernia   . Hyperlipidemia   . Hypertension   . Hypothyroidism   . Lupus ([place])   . Neuromuscular disorder ([facility])    fibromyalgia  . Pancreatitis   . Pneumonia     Past Surgical History:  Procedure Laterality Date  . APPENDECTOMY    . bladder tack    . CESAREAN SECTION    . CHOLECYSTECTOMY    . COLONOSCOPY     12 yrs ago  . LEFT HEART CATH AND CORONARY ANGIOGRAPHY N/A 11/22/2016   Procedure: Left Heart Cath and Coronary Angiography;  Surgeon: Burnell Blanks, MD;  Location: [facility];  Service: Cardiovascular;  Laterality: N/A;  . SEPTOPLASTY      Family History  Adopted: Yes  Problem Relation Age of Onset  . Diabetes Mellitus II Mother   . Colon polyps Mother   . Peptic Ulcer Mother   . CAD Father   . Diabetes Mellitus II Father   . CAD Sister   . Diabetes Mellitus II Sister   . CAD Brother   . Diabetes Mellitus II Brother   . Breast cancer Maternal Grandmother   . Stomach cancer Maternal Grandmother   . Colon cancer Maternal Grandmother   . Cirrhosis Cousin        x 2  . Esophageal cancer Neg Hx   . Rectal cancer Neg Hx     Social History:  reports that she has never smoked. She has never used smokeless tobacco. She reports that she drinks alcohol. She reports that she does not use drugs.   Review of Systems   Lipid history: She has been on Crestor for hyperlipidemia.  Followed by PCP     Lab Results  Component Value Date   CHOL 172 07/05/2017   HDL 42.70 07/05/2017   LDLCALC 109 (H) 10/07/2014   LDLDIRECT 104.0 07/05/2017   TRIG 232.0 (H) 07/05/2017   CHOLHDL 4 07/05/2017            Hypertension:   She is on low-dose 50 mg losartan, also on HCTZ but blood pressure is high today and is relatively anxious today    BP Readings from Last 3 Encounters:  04/26/18 (!) 154/78  03/04/18 140/70  03/01/18 110/70      HYPOTHYROIDISM: She thinks she has been hypothyroid since age of 66 and at that time she was having fatigue and lethargy. Her dosage was increased in 06/2017 Last TSH was normal   Lab Results  Component Value Date   TSH 0.74 11/29/2017   TSH 7.88 (H) 07/05/2017   TSH 0.50 02/23/2017   FREET4 0.95 11/29/2017   FREET4 1.33 10/01/2009    Depression: She has been continued on Cymbalta and Abilify, followed by PCP     Physical Examination:  BP (!) 154/78 (BP Location: Left Arm, Patient Position: Sitting, Cuff Size: Normal)   Pulse (!) 113   Ht 5\' 3"  (1.6 m)   Wt 207 lb (93.9 kg)   SpO2 96%   BMI 36.67 kg/m      ASSESSMENT:  Diabetes type 2, uncontrolled with obesity  See history of present illness for detailed discussion of current diabetes management, blood sugar patterns and problems identified   Blood sugars are glistening poorly controlled with average blood sugar 369 recently  She continues to be requiring large  amounts of insulin and her blood sugars are averaging over 300 at all times but relatively higher in the evening Again she has significant difficulty in watching her diet or planning her meals especially recently Although she may benefit from a weight loss drug this does not appear to be covered by her insurance Since she has intolerance to Ozempic she should benefit from [place] which she apparently could not afford previously Also will need significant increase in insulin doses May also benefit from using U-500 insulin but currently has a relatively large supply of NovoLog at home   PLAN for diabetes:   She will need to increase her Tresiba to the maximum dose on the plan of 160 units Jardiance 10 mg to be  started, discussed benefits and possible side effects Since she has high blood pressure currently will not adjust her blood pressure medications as yet Will need follow-up labs in 3 weeks  She will need to at least double the dose of her NovoLog and since sugars are the highest after supper will take 60 at suppertime and 40 at other meals She will need to call if she has consistently high readings She agrees to see the dietitian for help with meal planning which would improve her control also Discussed blood sugar targets at various times If she can check her sugars more often she will be able to get the freestyle libre system  Hypothyroidism: Will need labs today  Counseling time on subjects discussed in assessment and plan sections is over 50% of today's 25 minute visit   Patient Instructions  Tresiba 160 daily  Novolog 40 in am 40 lunch 60 supper       Counseling time on subjects discussed in assessment and plan sections is over 50% of today's 25 minute visit   Elayne Snare 04/28/2018, 9:44 PM   Note: This office note was prepared with Dragon voice recognition system technology. Any transcriptional errors that result from this process are unintentional.

## 2018-04-26 NOTE — Telephone Encounter (Signed)
Recommend follow up so we can discuss.

## 2018-04-26 NOTE — Telephone Encounter (Signed)
I called the pt and scheduled an appt for 8/16.

## 2018-04-26 NOTE — Patient Instructions (Addendum)
Tresiba 160 daily  Novolog 40 in am 40 lunch 60 supper

## 2018-05-03 ENCOUNTER — Ambulatory Visit: Payer: Medicare Other | Admitting: Family Medicine

## 2018-05-06 ENCOUNTER — Ambulatory Visit: Payer: Medicare Other | Admitting: Family Medicine

## 2018-05-21 ENCOUNTER — Other Ambulatory Visit: Payer: Self-pay | Admitting: Endocrinology

## 2018-05-23 ENCOUNTER — Ambulatory Visit: Payer: Medicare Other | Admitting: Dietician

## 2018-05-23 ENCOUNTER — Ambulatory Visit: Payer: Medicare Other | Admitting: Endocrinology

## 2018-06-12 ENCOUNTER — Telehealth: Payer: Self-pay | Admitting: *Deleted

## 2018-06-12 NOTE — Telephone Encounter (Signed)
I returned patients call, no answer left message to call me back.

## 2018-06-12 NOTE — Telephone Encounter (Signed)
Pt reports that she is having a clumpy discharge and itching. She has tried monistat 7 as well as diflucan. I advised that she make an appointment to have this evaluated. Patient agreeable.

## 2018-06-13 ENCOUNTER — Ambulatory Visit: Payer: Medicare Other | Admitting: Advanced Practice Midwife

## 2018-06-18 ENCOUNTER — Other Ambulatory Visit: Payer: Self-pay

## 2018-06-18 MED ORDER — INSULIN PEN NEEDLE 32G X 4 MM MISC: 4 refills | Status: DC

## 2018-06-18 MED ORDER — INSULIN ASPART 100 UNIT/ML FLEXPEN: PEN_INJECTOR | SUBCUTANEOUS | 4 refills | Status: DC

## 2018-06-25 NOTE — Progress Notes (Signed)
Patient ID: Yesenia Wilson, female   DOB: 03/09/1956, 62 y.o.   MRN: [id number]            Reason for Appointment: Follow-up for Type 2 Diabetes   History of Present Illness:          Date of diagnosis of type 2 diabetes mellitus: 2002       Background history:   She had been on metformin for several years with fair control Her blood sugars started increasing in 2016 with A1c over 7% Blood sugars were markedly increased with glucose of 440 in 2/17 and she may not have been regular with her metformin at that time.  She was given Amaryl also but subsequently blood sugars were continuing to be high She was tried on Trulicity in April 3818 but apparently this did not help her blood sugars  Recent history:  .  INSULIN regimen is: Tresiba 160 units daily Novolog Insulin: 40 units before AM meals  Non-insulin hypoglycemic drugs the patient is taking are: Metformin 1 g twice a day  A1c is surprisingly much higher at 12.3 compared to 9.8 previously  Current management, blood sugar patterns and problems identified:  Although her insulin doses were increased significantly in August her blood sugars are averaging about the same as last time  She thinks she is having some issues with stress and not consistently watching her diet or timing insulin before meals  However this does not explain why her FASTING blood sugars are averaging about the same as last time and almost 300  She is also taking Jardiance at least for the last month and not clear if this is benefiting  She also has not lost any weight  Currently not able to do much exercise for various reasons  She is taking her [place] in the morning using the maximum dose in the pen and she thinks she is taking it daily as directed  Also her blood sugars seem to be somewhat higher later in the day compared to her fasting readings; however is checking blood sugars mostly in the mornings  She continues to be taking psychotropic drugs  including Abilify   Side effects from medications have been: Possible nausea and vomiting from Ozempic  Compliance with the medical regimen: Inadequate  Hypoglycemia: None  Glucose monitoring:  done 2-3 times a day         Glucometer: One Touch.      Blood Glucose readings by download   PRE-MEAL Fasting Lunch Dinner Bedtime Overall  Glucose range:  204-372      Mean/median:  299  475    356   POST-MEAL PC Breakfast PC Lunch PC Dinner  Glucose range:  209-599   299-371  Mean/median:      Previous readings  PRE-MEAL Fasting Lunch Dinner Bedtime Overall  Glucose range:  234-516   324-601  388-567   Mean/median:      356     Self-care:     Typical meal intake: Breakfast is usually boiled eggs, tomato.  Lunch is usually [place] sandwich with fruit, sometimes salad.  Evening meal is salad or soup               Dietician visit, most recent: Years ago               Exercise: Unable to do any significant walking  Weight history:  Wt Readings from Last 3 Encounters:  06/26/18 210 lb (95.3 kg)  04/26/18 207 lb (93.9 kg)  03/04/18 206 lb 9.6 oz (93.7 kg)    Glycemic control:     Lab Results  Component Value Date   HGBA1C 12.3 (A) 06/26/2018   HGBA1C 9.8 (A) 03/04/2018   HGBA1C 8.9 11/29/2017   Lab Results  Component Value Date   MICROALBUR 2.7 (H) 11/29/2017   LDLCALC 109 (H) 10/07/2014   CREATININE 0.84 01/24/2018   Lab Results  Component Value Date   MICRALBCREAT 3.0 11/29/2017    Other active problems: See review of systems   Allergies as of 06/26/2018      Reactions   Oxycodone-acetaminophen    REACTION: GI upset   Propoxyphene N-acetaminophen    REACTION: nausea, rapid heartrate      Medication List        Accurate as of 06/26/18 11:36 AM. Always use your most recent med list.          aluminum-magnesium hydroxide-simethicone 176-160-73 MG/5ML Susp Commonly known as:  MAALOX Take 30 mLs by mouth 4 (four) times daily -  before meals and at  bedtime.   ARIPiprazole 2 MG tablet Commonly known as:  ABILIFY TAKE 1 TABLET (2 MG TOTAL) BY MOUTH AT BEDTIME.   aspirin 81 MG tablet Take 81 mg by mouth daily.   BD PEN NEEDLE NANO U/F 32G X 4 MM Misc Generic drug:  Insulin Pen Needle USE 3 PER TO DAY TO INJECT NOVOLOG   Insulin Pen Needle 32G X 4 MM Misc Use as directed to inject insulin 4 times daily.   DULoxetine 60 MG capsule Commonly known as:  CYMBALTA TAKE 1 CAPSULE (60 MG TOTAL) BY MOUTH DAILY.   empagliflozin 10 MG Tabs tablet Commonly known as:  JARDIANCE Take 10 mg by mouth daily with breakfast.   fluconazole 150 MG tablet Commonly known as:  DIFLUCAN Take 1 tablet (150 mg total) by mouth once for 1 dose.   glucose blood test strip USE TO TEST BLOOD SUGAR 3 TIMES A DAY DX CODE E11.9   hydrochlorothiazide 12.5 MG tablet Commonly known as:  HYDRODIURIL Take 12.5 mg by mouth daily.   insulin aspart 100 UNIT/ML FlexPen Commonly known as:  NOVOLOG Inject 40 units before each meal   Insulin Degludec 200 UNIT/ML Sopn Inject 160 Units into the skin daily.   insulin regular human CONCENTRATED 500 UNIT/ML kwikpen Commonly known as:  HUMULIN R 100 Units, 30 minutes before each meal   irbesartan-hydrochlorothiazide 300-12.5 MG tablet Commonly known as:  AVALIDE Take by mouth.   levothyroxine 137 MCG tablet Commonly known as:  SYNTHROID, LEVOTHROID Take 1 tablet by mouth every day before breakfast.   Lorcaserin HCl ER 20 MG Tb24 Take 20 mg by mouth daily with breakfast.   losartan 50 MG tablet Commonly known as:  COZAAR TAKE 1 TABLET BY MOUTH EVERY DAY   metFORMIN 1000 MG tablet Commonly known as:  GLUCOPHAGE TAKE 1 TABLET BY MOUTH TWICE A DAY   naproxen sodium 220 MG tablet Commonly known as:  ALEVE Take 220 mg by mouth.   ondansetron 4 MG tablet Commonly known as:  ZOFRAN Take 4 mg by mouth every 8 (eight) hours as needed for nausea or vomiting.   ONETOUCH DELICA LANCETS 71G Misc USE TO  CHECK BLOOD SUGAR 3 TIMES PER DAY. DX CODE E11.9   pantoprazole 40 MG tablet Commonly known as:  PROTONIX TAKE 1 TABLET BY MOUTH TWICE A DAY   rosuvastatin 20 MG tablet Commonly known as:  CRESTOR TAKE 1 TABLET (20 MG TOTAL) BY  MOUTH DAILY.   valACYclovir 1000 MG tablet Commonly known as:  VALTREX TAKE 2 AT ONSET OF COLD SORE AND REPEAT 2 IN 12 HOURS.       Allergies:  Allergies  Allergen Reactions  . Oxycodone-Acetaminophen     REACTION: GI upset  . Propoxyphene N-Acetaminophen     REACTION: nausea, rapid heartrate    Past Medical History:  Diagnosis Date  . Anxiety   . Asthma   . Clotting disorder ([place])    DVT left leg between ankle and knee due to tennis injury  . COPD (chronic obstructive pulmonary disease) ([place])   . Depression   . Diabetes mellitus   . DVT (deep venous thrombosis) ([facility])    left leg  . Fibromyalgia   . GERD (gastroesophageal reflux disease)   . Heart murmur   . Hiatal hernia   . Hyperlipidemia   . Hypertension   . Hypothyroidism   . Lupus ([place])   . Neuromuscular disorder ([facility])    fibromyalgia  . Pancreatitis   . Pneumonia     Past Surgical History:  Procedure Laterality Date  . APPENDECTOMY    . bladder tack    . CESAREAN SECTION    . CHOLECYSTECTOMY    . COLONOSCOPY     12 yrs ago  . LEFT HEART CATH AND CORONARY ANGIOGRAPHY N/A 11/22/2016   Procedure: Left Heart Cath and Coronary Angiography;  Surgeon: Burnell Blanks, MD;  Location: [facility];  Service: Cardiovascular;  Laterality: N/A;  . SEPTOPLASTY      Family History  Adopted: Yes  Problem Relation Age of Onset  . Diabetes Mellitus II Mother   . Colon polyps Mother   . Peptic Ulcer Mother   . CAD Father   . Diabetes Mellitus II Father   . CAD Sister   . Diabetes Mellitus II Sister   . CAD Brother   . Diabetes Mellitus II Brother   . Breast cancer Maternal Grandmother   . Stomach cancer Maternal Grandmother   . Colon cancer Maternal Grandmother   .  Cirrhosis Cousin        x 2  . Esophageal cancer Neg Hx   . Rectal cancer Neg Hx     Social History:  reports that she has never smoked. She has never used smokeless tobacco. She reports that she drinks alcohol. She reports that she does not use drugs.   Review of Systems   Lipid history: She has been on Crestor for hyperlipidemia.  Followed by PCP    Lab Results  Component Value Date   CHOL 172 07/05/2017   HDL 42.70 07/05/2017   LDLCALC 109 (H) 10/07/2014   LDLDIRECT 104.0 07/05/2017   TRIG 232.0 (H) 07/05/2017   CHOLHDL 4 07/05/2017           Hypertension:   She is on 50 mg losartan, also on HCTZ and Jardiance but blood pressure is again high today and is relatively anxious because of traffic on the way    BP Readings from Last 3 Encounters:  06/26/18 (!) 146/78  04/26/18 (!) 154/78  03/04/18 140/70      HYPOTHYROIDISM: She thinks she has been hypothyroid since age of 22 and at that time she was having fatigue and lethargy. Her dosage was increased in 06/2017 Last TSH was normal but needs follow-up   Lab Results  Component Value Date   TSH 0.74 11/29/2017   TSH 7.88 (H) 07/05/2017   TSH 0.50 02/23/2017  FREET4 0.95 11/29/2017   FREET4 1.33 10/01/2009    Depression: She has been continued on Cymbalta 60 mg and Abilify, followed by PCP  She is having persistent vaginal candidiasis more recently, has taken Diflucan twice in the last 3 weeks   Physical Examination:  BP (!) 146/78   Pulse (!) 112   Ht 5\' 3"  (1.6 m)   Wt 210 lb (95.3 kg)   SpO2 95%   BMI 37.20 kg/m   No ankle edema  ASSESSMENT:  Diabetes type 2, uncontrolled with obesity  See history of present illness for detailed discussion of current diabetes management, blood sugar patterns and problems identified   Her A1c has gone up very significantly and now 12.3  She is very insulin resistant She is also taking Jardiance lately which does not appear to be helping She probably does  have difficulty with compliance with diet and insulin but not clear how often she is doing this as directed She refuses to see the dietitian which was recommended again today   PLAN for diabetes:   She will need to try Humulin R U-500 Discussed in detail how this is different, timing of injection, duration of action and adjustment of doses Since there is no conversion factor for equivalents with NovoLog will empirically start her on 70 units before each meal Discussed injecting 30-minute before eating  She will however need to adjust at least the suppertime dose weekly by 10 units to get her bedtime and morning sugars below 200 at the minimum She will continue [place] unchanged Stop Jardiance until her candidiasis is cleared up  Showed her the use of the Omnipod pump as an option to more effectively deliver insulin, most likely with the U-500 insulin This was explained to her and she was going to look at the information and a sample at home also Needs consistent follow-up and more consistent monitoring at all times Emphasized the need for improving diet  Hypothyroidism: Will need labs today  Vaginal candidiasis: Although this is not a new problem she appears to have more symptoms with starting Jardiance and will need to stop this temporarily Diflucan given  Counseling time on subjects discussed in assessment and plan sections is over 50% of today's 25 minute visit   Patient Instructions  Stop Jardiance and NovoLog  Start the new insulin: Humulin R U-500 before each meal  This needs to be taken preferably 30 minutes before your planned meal  Start with 70 units before each meals.  If you are skipping the meal take only 35 units at that time  If your sugar levels at bedtime and in the morning are over 200 after every 5 days make the adjustment to your suppertime Humulin R dose and increase by 10 units  Continue Tresiba 160 units in the morning  Make sure you are not eating too  many carbohydrates or high fat foods      Elayne Snare 06/26/2018, 11:36 AM   Note: This office note was prepared with Dragon voice recognition system technology. Any transcriptional errors that result from this process are unintentional.

## 2018-06-26 ENCOUNTER — Ambulatory Visit (INDEPENDENT_AMBULATORY_CARE_PROVIDER_SITE_OTHER): Payer: Medicare Other | Admitting: Endocrinology

## 2018-06-26 ENCOUNTER — Other Ambulatory Visit: Payer: Self-pay | Admitting: Gastroenterology

## 2018-06-26 ENCOUNTER — Encounter: Payer: Self-pay | Admitting: Endocrinology

## 2018-06-26 VITALS — BP 146/78 | HR 112 | Ht 63.0 in | Wt 210.0 lb

## 2018-06-26 DIAGNOSIS — Z794 Long term (current) use of insulin: Secondary | ICD-10-CM

## 2018-06-26 DIAGNOSIS — E1165 Type 2 diabetes mellitus with hyperglycemia: Secondary | ICD-10-CM | POA: Diagnosis not present

## 2018-06-26 DIAGNOSIS — E063 Autoimmune thyroiditis: Secondary | ICD-10-CM

## 2018-06-26 LAB — POCT GLYCOSYLATED HEMOGLOBIN (HGB A1C): HEMOGLOBIN A1C: 12.3 % — AB (ref 4.0–5.6)

## 2018-06-26 LAB — BASIC METABOLIC PANEL
BUN: 23 mg/dL (ref 6–23)
CHLORIDE: 99 meq/L (ref 96–112)
CO2: 27 mEq/L (ref 19–32)
CREATININE: 1.23 mg/dL — AB (ref 0.40–1.20)
Calcium: 10.1 mg/dL (ref 8.4–10.5)
GFR: 46.73 mL/min — ABNORMAL LOW (ref >60.00)
GLUCOSE: 257 mg/dL — AB (ref 70–99)
Potassium: 3.7 mEq/L (ref 3.5–5.1)
Sodium: 138 mEq/L (ref 135–145)

## 2018-06-26 LAB — TSH: TSH: 14.2 u[IU]/mL — ABNORMAL HIGH (ref 0.35–4.50)

## 2018-06-26 MED ORDER — INSULIN REGULAR HUMAN (CONC) 500 UNIT/ML ~~LOC~~ SOPN: PEN_INJECTOR | SUBCUTANEOUS | 1 refills | Status: DC

## 2018-06-26 MED ORDER — INSULIN DEGLUDEC 200 UNIT/ML ~~LOC~~ SOPN: 160.0000 [IU] | PEN_INJECTOR | Freq: Every day | SUBCUTANEOUS | 3 refills | Status: DC

## 2018-06-26 MED ORDER — FLUCONAZOLE 150 MG PO TABS: 150.0000 mg | ORAL_TABLET | Freq: Once | ORAL | 0 refills | Status: AC

## 2018-06-26 NOTE — Patient Instructions (Addendum)
Stop Jardiance and NovoLog  Start the new insulin: Humulin R U-500 before each meal  This needs to be taken preferably 30 minutes before your planned meal  Start with 70 units before each meals.  If you are skipping the meal take only 35 units at that time  If your sugar levels at bedtime and in the morning are over 200 after every 5 days make the adjustment to your suppertime Humulin R dose and increase by 10 units  Continue Tresiba 160 units in the morning  Make sure you are not eating too many carbohydrates or high fat foods

## 2018-06-27 ENCOUNTER — Other Ambulatory Visit: Payer: Self-pay | Admitting: Endocrinology

## 2018-06-27 MED ORDER — LEVOTHYROXINE SODIUM 25 MCG PO TABS: 25.0000 ug | ORAL_TABLET | Freq: Every day | ORAL | 3 refills | Status: DC

## 2018-06-27 NOTE — Telephone Encounter (Signed)
Rx levothyroxine 10mcg disp-30, R-3 sent to [facility].

## 2018-06-28 ENCOUNTER — Telehealth: Payer: Self-pay | Admitting: Endocrinology

## 2018-06-28 NOTE — Telephone Encounter (Signed)
Pt stated her generic thyroid medication isn't working for her and she need to speak to nurse to get put back on Synthroid. Please call pt to discuss

## 2018-06-28 NOTE — Telephone Encounter (Signed)
She can use brand-name Synthroid but will need to use the new dose that was recommended on the result note

## 2018-06-28 NOTE — Telephone Encounter (Signed)
Please advise 

## 2018-06-28 NOTE — Telephone Encounter (Signed)
Rx send addition 7mcg synthroid sent to the [facility]

## 2018-07-04 DIAGNOSIS — S39012A Strain of muscle, fascia and tendon of lower back, initial encounter: Secondary | ICD-10-CM | POA: Diagnosis not present

## 2018-07-04 DIAGNOSIS — R51 Headache: Secondary | ICD-10-CM | POA: Diagnosis not present

## 2018-07-04 DIAGNOSIS — Z79899 Other long term (current) drug therapy: Secondary | ICD-10-CM | POA: Diagnosis not present

## 2018-07-04 DIAGNOSIS — Z7982 Long term (current) use of aspirin: Secondary | ICD-10-CM | POA: Diagnosis not present

## 2018-07-04 DIAGNOSIS — S0990XA Unspecified injury of head, initial encounter: Secondary | ICD-10-CM | POA: Diagnosis not present

## 2018-07-04 DIAGNOSIS — K219 Gastro-esophageal reflux disease without esophagitis: Secondary | ICD-10-CM | POA: Diagnosis not present

## 2018-07-04 DIAGNOSIS — E039 Hypothyroidism, unspecified: Secondary | ICD-10-CM | POA: Diagnosis not present

## 2018-07-04 DIAGNOSIS — I10 Essential (primary) hypertension: Secondary | ICD-10-CM | POA: Diagnosis not present

## 2018-07-04 DIAGNOSIS — S161XXA Strain of muscle, fascia and tendon at neck level, initial encounter: Secondary | ICD-10-CM | POA: Diagnosis not present

## 2018-07-04 DIAGNOSIS — E119 Type 2 diabetes mellitus without complications: Secondary | ICD-10-CM | POA: Diagnosis not present

## 2018-07-04 DIAGNOSIS — S4992XA Unspecified injury of left shoulder and upper arm, initial encounter: Secondary | ICD-10-CM | POA: Diagnosis not present

## 2018-07-04 DIAGNOSIS — M797 Fibromyalgia: Secondary | ICD-10-CM | POA: Diagnosis not present

## 2018-07-04 DIAGNOSIS — S3992XA Unspecified injury of lower back, initial encounter: Secondary | ICD-10-CM | POA: Diagnosis not present

## 2018-07-04 DIAGNOSIS — M25512 Pain in left shoulder: Secondary | ICD-10-CM | POA: Diagnosis not present

## 2018-07-04 DIAGNOSIS — M545 Low back pain: Secondary | ICD-10-CM | POA: Diagnosis not present

## 2018-07-04 DIAGNOSIS — Z794 Long term (current) use of insulin: Secondary | ICD-10-CM | POA: Diagnosis not present

## 2018-07-04 DIAGNOSIS — J449 Chronic obstructive pulmonary disease, unspecified: Secondary | ICD-10-CM | POA: Diagnosis not present

## 2018-07-04 DIAGNOSIS — S199XXA Unspecified injury of neck, initial encounter: Secondary | ICD-10-CM | POA: Diagnosis not present

## 2018-07-09 ENCOUNTER — Ambulatory Visit: Payer: Self-pay | Admitting: *Deleted

## 2018-07-09 ENCOUNTER — Ambulatory Visit (INDEPENDENT_AMBULATORY_CARE_PROVIDER_SITE_OTHER): Payer: Medicare Other | Admitting: Adult Health

## 2018-07-09 ENCOUNTER — Telehealth: Payer: Self-pay | Admitting: Endocrinology

## 2018-07-09 ENCOUNTER — Encounter: Payer: Self-pay | Admitting: Adult Health

## 2018-07-09 VITALS — BP 160/88 | Temp 98.3°F | Wt 212.0 lb

## 2018-07-09 DIAGNOSIS — I10 Essential (primary) hypertension: Secondary | ICD-10-CM | POA: Diagnosis not present

## 2018-07-09 DIAGNOSIS — B379 Candidiasis, unspecified: Secondary | ICD-10-CM

## 2018-07-09 DIAGNOSIS — M545 Low back pain: Secondary | ICD-10-CM | POA: Diagnosis not present

## 2018-07-09 DIAGNOSIS — G8929 Other chronic pain: Secondary | ICD-10-CM | POA: Diagnosis not present

## 2018-07-09 MED ORDER — IRBESARTAN-HYDROCHLOROTHIAZIDE 300-12.5 MG PO TABS: 1.0000 | ORAL_TABLET | Freq: Every day | ORAL | 0 refills | Status: DC

## 2018-07-09 MED ORDER — AMLODIPINE BESYLATE 5 MG PO TABS: 5.0000 mg | ORAL_TABLET | Freq: Every day | ORAL | 0 refills | Status: DC

## 2018-07-09 MED ORDER — FLUCONAZOLE 150 MG PO TABS: 150.0000 mg | ORAL_TABLET | Freq: Once | ORAL | 1 refills | Status: AC

## 2018-07-09 MED ORDER — IBUPROFEN 800 MG PO TABS: 800.0000 mg | ORAL_TABLET | Freq: Three times a day (TID) | ORAL | 0 refills | Status: DC | PRN

## 2018-07-09 NOTE — Progress Notes (Addendum)
Subjective:    Patient ID: Yesenia Wilson, female    DOB: 03-26-1956, 61 y.o.   MRN: [id number]  HPI  62 year old female who  has a past medical history of Anxiety, Asthma, Clotting disorder ([place]), COPD (chronic obstructive pulmonary disease) ([place]), Depression, Diabetes mellitus, DVT (deep venous thrombosis) ([place]), Fibromyalgia, GERD (gastroesophageal reflux disease), Heart murmur, Hiatal hernia, Hyperlipidemia, Hypertension, Hypothyroidism, Lupus ([place]), Neuromuscular disorder ([place]), Pancreatitis, and Pneumonia.  She presents to the office today for an acute visit for multiple problems   1. Complains of yeast infection. Has itching and burning. No UTI symptoms.   2. Uncontrolled blood pressure. Currently prescribed Avalide. BP continues to be elevated. She complains of intermittent headaches but no blurred vision. Has headache currently.   3. Acute on chronic pain - She reports tripping over her grandson and fell onto pavement?Marland Kitchen She was seen at OSH and reports Imaging was negative. She reports multi level back pain past baseline. She was prescribed Motrin 400 mg in the ER but feels as though this is not working well. She would like something stronger.   BP Readings from Last 3 Encounters:  07/09/18 (!) 160/88  06/26/18 (!) 146/78  04/26/18 (!) 154/78      Review of Systems See HPI   Past Medical History:  Diagnosis Date  . Anxiety   . Asthma   . Clotting disorder ([place])    DVT left leg between ankle and knee due to tennis injury  . COPD (chronic obstructive pulmonary disease) ([place])   . Depression   . Diabetes mellitus   . DVT (deep venous thrombosis) ([facility])    left leg  . Fibromyalgia   . GERD (gastroesophageal reflux disease)   . Heart murmur   . Hiatal hernia   . Hyperlipidemia   . Hypertension   . Hypothyroidism   . Lupus ([place] [place])   . Neuromuscular disorder ([facility])    fibromyalgia  . Pancreatitis   . Pneumonia     Social History   Socioeconomic History  . Marital  status: Single    Spouse name: Not on file  . Number of children: 2  . Years of education: Not on file  . Highest education level: Not on file  Occupational History  . Not on file  Social Needs  . Financial resource strain: Not on file  . Food insecurity:    Worry: Not on file    Inability: Not on file  . Transportation needs:    Medical: Not on file    Non-medical: Not on file  Tobacco Use  . Smoking status: Never Smoker  . Smokeless tobacco: Never Used  Substance and Sexual Activity  . Alcohol use: Yes    Comment: Once every 2-3 months   . Drug use: No  . Sexual activity: Yes  Lifestyle  . Physical activity:    Days per week: Not on file    Minutes per session: Not on file  . Stress: Not on file  Relationships  . Social connections:    Talks on phone: Not on file    Gets together: Not on file    Attends religious service: Not on file    Active member of club or organization: Not on file    Attends meetings of clubs or organizations: Not on file    Relationship status: Not on file  . Intimate partner violence:    Fear of current or ex partner: Not on file    Emotionally abused: Not on  file    Physically abused: Not on file    Forced sexual activity: Not on file  Other Topics Concern  . Not on file  Social History Narrative  . Not on file    Past Surgical History:  Procedure Laterality Date  . APPENDECTOMY    . bladder tack    . CESAREAN SECTION    . CHOLECYSTECTOMY    . COLONOSCOPY     12 yrs ago  . LEFT HEART CATH AND CORONARY ANGIOGRAPHY N/A 11/22/2016   Procedure: Left Heart Cath and Coronary Angiography;  Surgeon: Burnell Blanks, MD;  Location: [facility];  Service: Cardiovascular;  Laterality: N/A;  . SEPTOPLASTY      Family History  Adopted: Yes  Problem Relation Age of Onset  . Diabetes Mellitus II Mother   . Colon polyps Mother   . Peptic Ulcer Mother   . CAD Father   . Diabetes Mellitus II Father   . CAD Sister   . Diabetes  Mellitus II Sister   . CAD Brother   . Diabetes Mellitus II Brother   . Breast cancer Maternal Grandmother   . Stomach cancer Maternal Grandmother   . Colon cancer Maternal Grandmother   . Cirrhosis Cousin        x 2  . Esophageal cancer Neg Hx   . Rectal cancer Neg Hx     Allergies  Allergen Reactions  . Oxycodone-Acetaminophen     REACTION: GI upset  . Propoxyphene N-Acetaminophen     REACTION: nausea, rapid heartrate    Current Outpatient Medications on File Prior to Visit  Medication Sig Dispense Refill  . ARIPiprazole (ABILIFY) 2 MG tablet TAKE 1 TABLET (2 MG TOTAL) BY MOUTH AT BEDTIME. 90 tablet 0  . aspirin 81 MG tablet Take 81 mg by mouth daily.      . BD PEN NEEDLE NANO U/F 32G X 4 MM MISC USE 3 PER TO DAY TO INJECT NOVOLOG  3  . DULoxetine (CYMBALTA) 60 MG capsule TAKE 1 CAPSULE (60 MG TOTAL) BY MOUTH DAILY. 90 capsule 0  . glucose blood (ONETOUCH VERIO) test strip USE TO TEST BLOOD SUGAR 3 TIMES A DAY DX CODE E11.9 400 each 4  . Insulin Degludec (TRESIBA FLEXTOUCH) 200 UNIT/ML SOPN Inject 160 Units into the skin daily. 9 pen 3  . Insulin Pen Needle (BD PEN NEEDLE NANO U/F) 32G X 4 MM MISC Use as directed to inject insulin 4 times daily. 390 each 4  . insulin regular human CONCENTRATED (HUMULIN R U-500 KWIKPEN) 500 UNIT/ML kwikpen 100 Units, 30 minutes before each meal 2 pen 1  . levothyroxine (SYNTHROID, LEVOTHROID) 25 MCG tablet Take 1 tablet (25 mcg total) by mouth daily before breakfast. 30 tablet 3  . levothyroxine (UNITHROID) 137 MCG tablet Take 1 tablet by mouth every day before breakfast. 30 tablet 3  . metFORMIN (GLUCOPHAGE) 1000 MG tablet TAKE 1 TABLET BY MOUTH TWICE A DAY 180 tablet 1  . Multiple Vitamins-Minerals (MULTIVITAMIN WOMEN 50+ PO) Take 1 tablet by mouth daily.    Glory Rosebush DELICA LANCETS 62Z MISC USE TO CHECK BLOOD SUGAR 3 TIMES PER DAY. DX CODE E11.9 200 each 2  . pantoprazole (PROTONIX) 40 MG tablet TAKE 1 TABLET BY MOUTH TWICE A DAY 180 tablet 0    . rosuvastatin (CRESTOR) 20 MG tablet TAKE 1 TABLET (20 MG TOTAL) BY MOUTH DAILY. 90 tablet 1  . valACYclovir (VALTREX) 1000 MG tablet TAKE 2 AT ONSET OF  COLD SORE AND REPEAT 2 IN 12 HOURS. 90 tablet 0   No current facility-administered medications on file prior to visit.     BP (!) 160/88   Temp 98.3 F (36.8 C) (Oral)   Wt 212 lb (96.2 kg)   BMI 37.55 kg/m       Objective:   Physical Exam  Constitutional: She is oriented to person, place, and time. She appears well-developed and well-nourished. No distress.  Eyes: Pupils are equal, round, and reactive to light. Conjunctivae and EOM are normal.  Cardiovascular: Normal rate, regular rhythm, normal heart sounds and intact distal pulses.  Pulmonary/Chest: Effort normal and breath sounds normal.  Musculoskeletal: Normal range of motion. She exhibits no edema, tenderness or deformity.  No spinal tenderness with palpation  Very animated and moves all extremities without difficulty.   Neurological: She is alert and oriented to person, place, and time.  Skin: Skin is warm and dry. Capillary refill takes less than 2 seconds. She is not diaphoretic.  Psychiatric: She has a normal mood and affect. Her behavior is normal. Judgment and thought content normal.  Nursing note and vitals reviewed.     Assessment & Plan:  1. Essential hypertension - Will add Norvac 5 mg as elevated blood pressure is likely causing headaches.  - Keep log of BP at home  - Follow up with PCP next week or sooner if needed - amLODipine (NORVASC) 5 MG tablet; Take 1 tablet (5 mg total) by mouth daily.  Dispense: 30 tablet; Refill: 0  2. Chronic bilateral low back pain without sciatica - Unable to prescribe narcotics to this patient. Will prescribe short course of Advil 800 mg. Advised follow up with PCP next week, use a heating pad, stretching exercises.  - ibuprofen (ADVIL,MOTRIN) 800 MG tablet; Take 1 tablet (800 mg total) by mouth every 8 (eight) hours as needed  for up to 30 doses.  Dispense: 30 tablet; Refill: 0  3. Yeast infection  - fluconazole (DIFLUCAN) 150 MG tablet; Take 1 tablet (150 mg total) by mouth once for 1 dose.  Dispense: 1 tablet; Refill: 1  * She had a list of other chronic issues. She was advised to follow up with her PCP as he would be the one to monitor her chronic issues. She was agreeable to this   Dorothyann Peng, NP

## 2018-07-09 NOTE — Telephone Encounter (Signed)
Pt seen in [place] with [facility].

## 2018-07-09 NOTE — Telephone Encounter (Signed)
Pt reports headache x 6 months, worsening x 1 month. States entire head, around to back of neck. States constant x 2 weeks, 10/10. Also reports dry cough, increased fatigue and SOB at times. States saw Dr. Dwyane Dee 06/26/18; TSH at that time 14.20, levothyroxine 25mg  added to her 185mcg dose. Reports BP "Has been high, 189/105 yesterday" Did not check today and is not at home. Reports went to [facility] ED this weekend for similar symptoms, "Gave me 2 shots and sent me home with Ibuprofen which doesn't help."  No availability with Dr. Elease Hashimoto today, appt made with C. Nafziger.  Instructed pt to go to ED if any symptoms worsen; states "They won't do anything but send me home."  Instructed pt to bring home monitor and BP readings to appt.  Reason for Disposition . [1] New headache AND [2] age > 50    Headache x 6 months, worsening x 1 month  Answer Assessment - Initial Assessment Questions 1. LOCATION: "Where does it hurt?"      Entire head, around to back of neck 2. ONSET: "When did the headache start?" (Minutes, hours or days)      6 months ago, worsening past 1 omnth 3. PATTERN: "Does the pain come and go, or has it been constant since it started?"     Constant x 2 weeks 4. SEVERITY: "How bad is the pain?" and "What does it keep you from doing?"  (e.g., Scale 1-10; mild, moderate, or severe)   - MILD (1-3): doesn't interfere with normal activities    - MODERATE (4-7): interferes with normal activities or awakens from sleep    - SEVERE (8-10): excruciating pain, unable to do any normal activities        10/10 5. RECURRENT SYMPTOM: "Have you ever had headaches before?" If so, ask: "When was the last time?" and "What happened that time?"      Yes 6. CAUSE: "What do you think is causing the headache?"     Neck pain 7. MIGRAINE: "Have you been diagnosed with migraine headaches?" If so, ask: "Is this headache similar?"      no 8. HEAD INJURY: "Has there been any recent injury to the head?"   no 9. OTHER SYMPTOMS: "Do you have any other symptoms?" (fever, stiff neck, eye pain, sore throat, cold symptoms)     Dry cough, increased fatigue, BP yesterday 189/105, mild SOB at times  Protocols used: HEADACHE-A-AH

## 2018-07-09 NOTE — Telephone Encounter (Signed)
Ref # [id number]    Need diagnosis to process the prior auth for Omnipod-Dash system. Please advise

## 2018-07-09 NOTE — Telephone Encounter (Signed)
Noted  

## 2018-07-09 NOTE — Patient Instructions (Signed)
It was great meeting you today and I am sorry I cannot help you out with your chronic issues.   I have added a blood pressure medication called Norvasc, take this daily   I have sent in a prescription for Diflucan for yeast infection   I have also sent in Motrin 800 mg for pain   Please follow up with Dr. Elease Hashimoto next week

## 2018-07-10 ENCOUNTER — Telehealth: Payer: Self-pay

## 2018-07-10 NOTE — Telephone Encounter (Signed)
Patient called today and she is aware of approval but is also requesting being put on freestyle libre immediately because her daughter is a Marine scientist and stated she needs this ASAP

## 2018-07-10 NOTE — Telephone Encounter (Signed)
Awaiting call back from omnipod rep

## 2018-07-10 NOTE — Telephone Encounter (Signed)
Patient called in to speak with office manager, Yesenia Wilson was not available so I offered to speak with the patient.   She was calling to advise that she will be leaving the practice because she feels she has not been treated properly for her medical concerns and conditions. She states her daughter is not happy with the care she has received here and has told her she will not be coming back to this office. Pt is upset that Yesenia Wilson "did not look in her mouth" and "did not feel her throat" yesterday when she was here for office visit. I explained her triage call revolved around a headache and elevated BP so he focused on those issues. It was an acute visit and not meant to address chronic health conditions. She states this "is not fair" and "is a waste of my time, gas and insurance money". She says Yesenia Wilson did not treat her for anything, however when I asked her about the new medications he gave her to address her concerns she admits he did give her medication, I explained this is treating her for the acute issues she presented with during the visit. She also states she was told Yesenia Wilson was not in the office and she would have to see another provider, then when she came in and saw Yesenia Wilson in the hallway she became upset. I attempted to explain to the patient most likely she was advised Yesenia Wilson did not have any openings today and she would need to see another provider based on the outcome of the triage call. She did not accept this explanation and was sure this was not what she was told. She is adamant about leaving the practice because she feels she has not been properly treated and that her daughter has told her "I will die before anyone there treats me". I explained it was her right to choose to leave the practice and we apologize for her feeling as though we did not treat her. She reiterated she would not be coming back to this office, I have removed Yesenia Wilson as her PCP and canceled her appt at her  request.  I listened to the call and the patient was advised Yesenia Wilson did not have any openings this day but she could see Yesenia Wilson, the NP, to which she agreed. (The patient was in the drive-thru at a restaurant and may not have been listening to the triage nurse at this time.)   Yesenia Wilson - Yesenia Wilson. Thanks!

## 2018-07-10 NOTE — Telephone Encounter (Signed)
[employer] approved omnipod  From 06/28/18-until further notice ticket # [id number]

## 2018-07-10 NOTE — Telephone Encounter (Signed)
Noted.  Agree that it would be in her (and our ) best interest to establish new primary care if she is unsatisfied with our care.

## 2018-07-11 DIAGNOSIS — M5126 Other intervertebral disc displacement, lumbar region: Secondary | ICD-10-CM | POA: Diagnosis not present

## 2018-07-12 ENCOUNTER — Ambulatory Visit: Payer: Medicare Other | Admitting: Family Medicine

## 2018-07-15 NOTE — Telephone Encounter (Signed)
Pt is aware that Dr. Dwyane Dee is out of town and he would be the one to give the ok to order the freestyle libre. She was ok with [place] sending the message to [place].   Mickel Baas, pt states she received her omnipod and wanted to know if there is any way she can come in to see you to help assist her set it up.

## 2018-07-15 NOTE — Telephone Encounter (Signed)
Message received that patient now has her Omnipod pump and is ready for this to be place.   She was not available.  Message left that I will leave a message with the CDE in our office that currently does the pump training.  The other option is to call the Omnipod rep for training.  Patient to call for questions.  Nutrition Office number provided.  Antonieta Iba, RD, LDN, CDE

## 2018-07-16 DIAGNOSIS — N390 Urinary tract infection, site not specified: Secondary | ICD-10-CM | POA: Diagnosis not present

## 2018-07-16 DIAGNOSIS — E039 Hypothyroidism, unspecified: Secondary | ICD-10-CM | POA: Diagnosis not present

## 2018-07-16 DIAGNOSIS — R51 Headache: Secondary | ICD-10-CM | POA: Diagnosis not present

## 2018-07-16 DIAGNOSIS — G4730 Sleep apnea, unspecified: Secondary | ICD-10-CM | POA: Diagnosis not present

## 2018-07-16 DIAGNOSIS — R197 Diarrhea, unspecified: Secondary | ICD-10-CM | POA: Diagnosis not present

## 2018-07-16 DIAGNOSIS — Z9049 Acquired absence of other specified parts of digestive tract: Secondary | ICD-10-CM | POA: Diagnosis not present

## 2018-07-16 DIAGNOSIS — J449 Chronic obstructive pulmonary disease, unspecified: Secondary | ICD-10-CM | POA: Diagnosis not present

## 2018-07-16 DIAGNOSIS — Z794 Long term (current) use of insulin: Secondary | ICD-10-CM | POA: Diagnosis not present

## 2018-07-16 DIAGNOSIS — Z90710 Acquired absence of both cervix and uterus: Secondary | ICD-10-CM | POA: Diagnosis not present

## 2018-07-16 DIAGNOSIS — M797 Fibromyalgia: Secondary | ICD-10-CM | POA: Diagnosis not present

## 2018-07-16 DIAGNOSIS — M79645 Pain in left finger(s): Secondary | ICD-10-CM | POA: Diagnosis not present

## 2018-07-16 DIAGNOSIS — M79644 Pain in right finger(s): Secondary | ICD-10-CM | POA: Diagnosis not present

## 2018-07-16 DIAGNOSIS — R112 Nausea with vomiting, unspecified: Secondary | ICD-10-CM | POA: Diagnosis not present

## 2018-07-16 DIAGNOSIS — R220 Localized swelling, mass and lump, head: Secondary | ICD-10-CM | POA: Diagnosis not present

## 2018-07-16 DIAGNOSIS — R0602 Shortness of breath: Secondary | ICD-10-CM | POA: Diagnosis not present

## 2018-07-16 DIAGNOSIS — R0902 Hypoxemia: Secondary | ICD-10-CM | POA: Diagnosis not present

## 2018-07-16 DIAGNOSIS — E785 Hyperlipidemia, unspecified: Secondary | ICD-10-CM | POA: Diagnosis not present

## 2018-07-16 DIAGNOSIS — Z79899 Other long term (current) drug therapy: Secondary | ICD-10-CM | POA: Diagnosis not present

## 2018-07-16 DIAGNOSIS — Z808 Family history of malignant neoplasm of other organs or systems: Secondary | ICD-10-CM | POA: Diagnosis not present

## 2018-07-16 DIAGNOSIS — K219 Gastro-esophageal reflux disease without esophagitis: Secondary | ICD-10-CM | POA: Diagnosis not present

## 2018-07-16 DIAGNOSIS — Z8249 Family history of ischemic heart disease and other diseases of the circulatory system: Secondary | ICD-10-CM | POA: Diagnosis not present

## 2018-07-16 DIAGNOSIS — R609 Edema, unspecified: Secondary | ICD-10-CM | POA: Diagnosis not present

## 2018-07-16 DIAGNOSIS — E1165 Type 2 diabetes mellitus with hyperglycemia: Secondary | ICD-10-CM | POA: Diagnosis not present

## 2018-07-16 DIAGNOSIS — E860 Dehydration: Secondary | ICD-10-CM | POA: Diagnosis not present

## 2018-07-16 DIAGNOSIS — R221 Localized swelling, mass and lump, neck: Secondary | ICD-10-CM | POA: Diagnosis not present

## 2018-07-16 DIAGNOSIS — I10 Essential (primary) hypertension: Secondary | ICD-10-CM | POA: Diagnosis not present

## 2018-07-17 ENCOUNTER — Ambulatory Visit: Payer: Medicare Other | Admitting: Family Medicine

## 2018-07-17 DIAGNOSIS — E039 Hypothyroidism, unspecified: Secondary | ICD-10-CM | POA: Diagnosis not present

## 2018-07-17 DIAGNOSIS — E1165 Type 2 diabetes mellitus with hyperglycemia: Secondary | ICD-10-CM | POA: Diagnosis not present

## 2018-07-17 DIAGNOSIS — N390 Urinary tract infection, site not specified: Secondary | ICD-10-CM | POA: Diagnosis not present

## 2018-07-17 DIAGNOSIS — I10 Essential (primary) hypertension: Secondary | ICD-10-CM | POA: Diagnosis not present

## 2018-07-17 DIAGNOSIS — E785 Hyperlipidemia, unspecified: Secondary | ICD-10-CM | POA: Diagnosis not present

## 2018-07-19 DIAGNOSIS — E010 Iodine-deficiency related diffuse (endemic) goiter: Secondary | ICD-10-CM | POA: Diagnosis not present

## 2018-07-21 ENCOUNTER — Other Ambulatory Visit: Payer: Self-pay | Admitting: Gastroenterology

## 2018-07-21 NOTE — Telephone Encounter (Signed)
Okay to have patient get a prescription for the freestyle libre reader and sensor but also needs to schedule Omnipod pump training with Vaughan Basta.  She is temporarily scheduled to see Dr. Gayla Medicus in follow-up in a few days.  May be best that she be instructed on the freestyle libre when she comes in on the 13th with Vaughan Basta or Mickel Baas

## 2018-07-22 DIAGNOSIS — I10 Essential (primary) hypertension: Secondary | ICD-10-CM | POA: Diagnosis not present

## 2018-07-22 DIAGNOSIS — K210 Gastro-esophageal reflux disease with esophagitis: Secondary | ICD-10-CM | POA: Diagnosis not present

## 2018-07-22 DIAGNOSIS — E119 Type 2 diabetes mellitus without complications: Secondary | ICD-10-CM | POA: Diagnosis not present

## 2018-07-22 DIAGNOSIS — E038 Other specified hypothyroidism: Secondary | ICD-10-CM | POA: Diagnosis not present

## 2018-07-23 ENCOUNTER — Telehealth: Payer: Self-pay | Admitting: Endocrinology

## 2018-07-23 NOTE — Telephone Encounter (Signed)
Please advise 

## 2018-07-23 NOTE — Telephone Encounter (Signed)
Frequency of testing will need to be documented from her meter download on the next visit

## 2018-07-23 NOTE — Telephone Encounter (Signed)
Patient stated that she received her Omni pod in the mail but has not heard from our office about scheduling training

## 2018-07-23 NOTE — Telephone Encounter (Signed)
Pt required to test sugar at least 4x daily within 30 days documented on the note for DME supplier--can approve with the [employer]....please advise.

## 2018-07-29 ENCOUNTER — Other Ambulatory Visit: Payer: Self-pay | Admitting: Endocrinology

## 2018-07-29 ENCOUNTER — Telehealth: Payer: Self-pay | Admitting: Endocrinology

## 2018-07-29 NOTE — Progress Notes (Signed)
Name: Yesenia Wilson  Age/ Sex: 62 y.o., female   MRN/ DOB: [id number], [birth year]     PCP: Neale Burly, MD   Reason for Endocrinology Evaluation: Type 2 Diabetes Mellitus  Initial Endocrine Consultative Visit: 07/05/16    PATIENT IDENTIFIER: [name] is a 62 y.o. female with a past medical history of HTN, T2DM, OSA, Hypothyroidism and GERD. The patient has followed with Endocrinology clinic since 2017 for consultative assistance with management of her diabetes.  DIABETIC HISTORY:  Yesenia Wilson was diagnosed with T2DM in 2002, She was under good control on Metformin for years, until 2016 when her A1c was over 7.0% , she was tried on amaryl and trulicity but due to persistent hyperglycemia insulin therapy was initiated. Her hemoglobin A1c has ranged from 5.6% in 2011, peaking at 12.3%  In 2019.   SUBJECTIVE:   During the last visit (06/26/18): A1c was 12.3%. Jardiance was stopped. She was continued on Tresiba and started on Humulin U-500 at 70 units   Today (07/31/2018): Yesenia Wilson is here for diabetes management. She presented to [facility] ED on 10/9  for abdominal pain and HTN. She was diagnosed with UTI and vaginal infections. Patient stopped Tyler Aas since her last visit with Dr. Dwyane Dee and was taking Humulin U500  100 units with Breakfast only . During hospitalization she was started on Basaglar 20 units QHS, she had a "good talk" with the physician in the ED and is currently motivated to change her lifestyle.  She checks her blood sugars 3 times daily, before meals. The patient has not had hypoglycemic episodes since the last clinic visit. Otherwise, the patient has not required any recent emergency interventions for hypoglycemia and has not had recent hospitalizations secondary to hyper or hypoglycemic episodes. (just for UTI)  Pt ate vegetables last night for supper, she only took the U-500 with  breakfast yesterday but nothing the rest of the day and woke up this morning "feeling funny" BG was 80 mg/dL.   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  Today she is motivated to make changes.    ROS: As per HPI and as detailed below: Review of Systems  Constitutional: Positive for malaise/fatigue. Negative for weight loss.  HENT: Negative for congestion and sore throat.   Cardiovascular: Negative for chest pain and palpitations.  Gastrointestinal: Positive for abdominal pain.  Genitourinary: Positive for frequency.  Skin: Negative for itching and rash.  Endo/Heme/Allergies: Positive for polydipsia.      HOME DIABETES REGIMEN:  Tyler Aas  - Not taking  Basaglar 20 units at bedtime Humulin - U 500 70 units TID - pt been taking 100 units with breakfast only  Metformin 2000 mg daily    METER DOWNLOAD SUMMARY: Date range evaluated: 10/15-11/13/19 Fingerstick Blood Glucose Tests = 31 Overall Mean FS Glucose = 335 Standard Deviation = 108.3  BG Ranges: Low = 80 High = 600   Hypoglycemic Events/30 Days: BG < 50 = 0 Episodes of symptomatic severe hypoglycemia = 0     HISTORY:  Past Medical History:  Past Medical History:  Diagnosis Date  . Anxiety   . Asthma   . Clotting disorder ([place])    DVT left leg between ankle and knee due to tennis injury  . COPD (chronic obstructive pulmonary disease) ([place])   . Depression   . Diabetes mellitus   . DVT (deep venous thrombosis) ([place])  left leg  . Fibromyalgia   . GERD (gastroesophageal reflux disease)   . Heart murmur   . Hiatal hernia   . Hyperlipidemia   . Hypertension   . Hypothyroidism   . Lupus ([place])   . Neuromuscular disorder ([facility])    fibromyalgia  . Pancreatitis   . Pneumonia    Past Surgical History:  Past Surgical History:  Procedure Laterality Date  . APPENDECTOMY    . bladder tack    . CESAREAN SECTION    .  CHOLECYSTECTOMY    . COLONOSCOPY     12 yrs ago  . LEFT HEART CATH AND CORONARY ANGIOGRAPHY N/A 11/22/2016   Procedure: Left Heart Cath and Coronary Angiography;  Surgeon: Burnell Blanks, MD;  Location: [place] [facility];  Service: Cardiovascular;  Laterality: N/A;  . SEPTOPLASTY      Social History:  reports that she has never smoked. She has never used smokeless tobacco. She reports that she drinks alcohol. She reports that she does not use drugs. Family History:  Family History  Adopted: Yes  Problem Relation Age of Onset  . Diabetes Mellitus II Mother   . Colon polyps Mother   . Peptic Ulcer Mother   . CAD Father   . Diabetes Mellitus II Father   . CAD Sister   . Diabetes Mellitus II Sister   . CAD Brother   . Diabetes Mellitus II Brother   . Breast cancer Maternal Grandmother   . Stomach cancer Maternal Grandmother   . Colon cancer Maternal Grandmother   . Cirrhosis Cousin        x 2  . Esophageal cancer Neg Hx   . Rectal cancer Neg Hx      HOME MEDICATIONS: Allergies as of 07/31/2018      Reactions   Oxycodone-acetaminophen    REACTION: GI upset   Propoxyphene N-acetaminophen    REACTION: nausea, rapid heartrate      Medication List        Accurate as of 07/31/18  2:43 PM. Always use your most recent med list.          amLODipine 5 MG tablet Commonly known as:  NORVASC Take 1 tablet (5 mg total) by mouth daily.   ARIPiprazole 2 MG tablet Commonly known as:  ABILIFY TAKE 1 TABLET (2 MG TOTAL) BY MOUTH AT BEDTIME.   aspirin 81 MG tablet Take 81 mg by mouth daily.   BD PEN NEEDLE NANO U/F 32G X 4 MM Misc Generic drug:  Insulin Pen Needle USE 3 PER TO DAY TO INJECT NOVOLOG   Insulin Pen Needle 32G X 4 MM Misc Use as directed to inject insulin 4 times daily.   DULoxetine 60 MG capsule Commonly known as:  CYMBALTA TAKE 1 CAPSULE (60 MG TOTAL) BY MOUTH DAILY.   glucose blood test strip USE TO TEST BLOOD SUGAR 3 TIMES A DAY DX CODE E11.9     ibuprofen 800 MG tablet Commonly known as:  ADVIL,MOTRIN Take 1 tablet (800 mg total) by mouth every 8 (eight) hours as needed for up to 30 doses.   insulin regular human CONCENTRATED 500 UNIT/ML kwikpen Commonly known as:  HUMULIN R 100 Units, 30 minutes before each meal   irbesartan-hydrochlorothiazide 300-12.5 MG tablet Commonly known as:  AVALIDE Take 1 tablet by mouth daily.   levothyroxine 200 MCG tablet Commonly known as:  SYNTHROID, LEVOTHROID Take 200 mcg by mouth daily.   metFORMIN 1000 MG tablet Commonly known as:  GLUCOPHAGE TAKE 1 TABLET BY MOUTH TWICE A DAY   metoprolol succinate 25 MG 24 hr tablet Commonly known as:  TOPROL-XL Take 25 mg by mouth daily.   MULTIVITAMIN WOMEN 50+ PO Take 1 tablet by mouth daily.   ONETOUCH DELICA LANCETS 03K Misc USE TO CHECK BLOOD SUGAR 3 TIMES PER DAY. DX CODE E11.9   pantoprazole 40 MG tablet Commonly known as:  PROTONIX TAKE 1 TABLET BY MOUTH TWICE A DAY   rosuvastatin 20 MG tablet Commonly known as:  CRESTOR TAKE 1 TABLET (20 MG TOTAL) BY MOUTH DAILY.   valACYclovir 1000 MG tablet Commonly known as:  VALTREX TAKE 2 AT ONSET OF COLD SORE AND REPEAT 2 IN 12 HOURS.        OBJECTIVE:   Vital Signs: BP 124/78   Pulse (!) 104   Resp 16   Ht 5\' 3"  (1.6 m)   Wt 211 lb (95.7 kg)   SpO2 98%   BMI 37.38 kg/m   Wt Readings from Last 3 Encounters:  07/31/18 211 lb (95.7 kg)  07/09/18 212 lb (96.2 kg)  06/26/18 210 lb (95.3 kg)     Exam: General: Pt appears well and is in NAD  Hydration: Well-hydrated with moist mucous membranes and good skin turgor  Lungs: Clear with good BS bilat with no rales, rhonchi, or wheezes  Heart: RRR with normal S1 and S2 and no gallops; no murmurs; no rub  Abdomen: Normoactive bowel sounds, soft, nontender, without masses or organomegaly palpable  Extremities: No pretibial edema.  Skin: Normal texture and temperature to palpation. No rash noted.  Neuro: MS is good with  appropriate affect, pt is alert and Ox3      DATA REVIEWED:  Lab Results  Component Value Date   HGBA1C 12.3 (A) 06/26/2018   HGBA1C 9.8 (A) 03/04/2018   HGBA1C 8.9 11/29/2017   Lab Results  Component Value Date   MICROALBUR 2.7 (H) 11/29/2017   LDLCALC 109 (H) 10/07/2014   CREATININE 1.23 (H) 06/26/2018   Lab Results  Component Value Date   MICRALBCREAT 3.0 11/29/2017    Lab Results  Component Value Date   FRUCTOSAMINE 376 (H) 07/05/2017   FRUCTOSAMINE 279 08/03/2016     Lab Results  Component Value Date   CHOL 172 07/05/2017   HDL 42.70 07/05/2017   LDLCALC 109 (H) 10/07/2014   LDLDIRECT 104.0 07/05/2017   TRIG 232.0 (H) 07/05/2017   CHOLHDL 4 07/05/2017         ASSESSMENT / PLAN / RECOMMENDATIONS:   1) Type 2 Diabetes Mellitus, poorly controlled:  - Most recent A1c of 12.3 %. Goal A1c < 7.0%.   Plan:  - Poorly controlled diabetes is due to medication non-adherence and dietary indiscretions. I don't think the patient has been taking all insulin as prescribed in the past, a perfect example is last night, when she ate vegetables for supper with no prandial nor basal insulin on board,she woke up with a BG of 80 mg/dL  - In review of her meter download, her BG's have been > 250 mg/dL.  - Praised patient on motivation with lifestyle changes.  - Due to recent change in her diet (whether she will continue on it or not) I will preemptively reduce her doses of insulin as below.  - She was advised to avoid snacking and sugar- sweetened beverages.  - I am not sure she is a good candidate for a pump, as I am not sure how willing she will  be to learn to carb count, it seems she has too may things going on at home, but she will see our dietician and make a final decision then after her meeting.   MEDICATIONS:  Stop Basaglar  DO not take tresiba  Humulin U-500 50 units with Breakfast and Lunch   Humulin U-500 35 units with Supper  Continue Metformin 2000 mg daily     She was advised to hold off on insulin if she is not going to eat carbohydrates with her meal.   EDUCATION / INSTRUCTIONS:  BG monitoring instructions: Patient is instructed to check her blood sugars 3 times a day, before meals.  Call [facility] if: BG persistently < 70 or > 300. . I reviewed the Rule of 15 for the treatment of hypoglycemia in detail with the patient. Literature supplied.     F/U in 4 weeks    Signed electronically by: Mack Guise, MD  [facility]  [address] [address] [place], [place] [address] Phone: [phone] FAX: [phone]   CC: Neale Burly, MD [place] [address] Phone: [phone]  Fax: [phone]  Return to Endocrinology clinic as below: No future appointments.

## 2018-07-29 NOTE — Telephone Encounter (Signed)
Omnipod dash 5 packs--approval 06/28/18-06/28/2018 until further notice.

## 2018-07-31 ENCOUNTER — Ambulatory Visit (INDEPENDENT_AMBULATORY_CARE_PROVIDER_SITE_OTHER): Payer: Medicare Other | Admitting: Internal Medicine

## 2018-07-31 ENCOUNTER — Encounter: Payer: Self-pay | Admitting: Internal Medicine

## 2018-07-31 ENCOUNTER — Other Ambulatory Visit: Payer: Self-pay | Admitting: Adult Health

## 2018-07-31 ENCOUNTER — Telehealth: Payer: Self-pay | Admitting: Internal Medicine

## 2018-07-31 VITALS — BP 124/78 | HR 104 | Resp 16 | Ht 63.0 in | Wt 211.0 lb

## 2018-07-31 DIAGNOSIS — I10 Essential (primary) hypertension: Secondary | ICD-10-CM

## 2018-07-31 DIAGNOSIS — Z794 Long term (current) use of insulin: Secondary | ICD-10-CM

## 2018-07-31 DIAGNOSIS — E1165 Type 2 diabetes mellitus with hyperglycemia: Secondary | ICD-10-CM

## 2018-07-31 MED ORDER — INSULIN REGULAR HUMAN (CONC) 500 UNIT/ML ~~LOC~~ SOPN: PEN_INJECTOR | SUBCUTANEOUS | 1 refills | Status: DC

## 2018-07-31 NOTE — Patient Instructions (Addendum)
-   Stop Basaglar  - Continue Metformin 2000 mg daily  - Humulin U-500 take 50 units with Breakfast and Lunch  - Humulin U-500 take 35 units with Supper  - Avoid sugar-sweetened beverages  - Check sugars before each meal   -  HOW TO TREAT LOW BLOOD SUGARS (Blood sugar LESS THAN 70 MG/DL)  Please follow the RULE OF 15 for the treatment of hypoglycemia treatment (when your (blood sugars are less than 70 mg/dL)    STEP 1: Take 15 grams of carbohydrates when your blood sugar is low, which includes:   3-4 GLUCOSE TABS  OR  3-4 OZ OF JUICE OR REGULAR SODA OR  ONE TUBE OF GLUCOSE GEL     STEP 2: RECHECK blood sugar in 15 MINUTES STEP 3: If your blood sugar is still low at the 15 minute recheck --> then, go back to STEP 1 and treat AGAIN with another 15 grams of carbohydrates.

## 2018-07-31 NOTE — Telephone Encounter (Signed)
Called pt left a message for a call back to get her back on laura's schedule

## 2018-07-31 NOTE — Telephone Encounter (Signed)
Pt is no longer under the care of Dr. Elease Hashimoto.  Prescriptions refused.

## 2018-08-02 ENCOUNTER — Other Ambulatory Visit: Payer: Self-pay | Admitting: Adult Health

## 2018-08-02 DIAGNOSIS — I10 Essential (primary) hypertension: Secondary | ICD-10-CM

## 2018-08-05 ENCOUNTER — Telehealth: Payer: Self-pay | Admitting: Nutrition

## 2018-08-05 NOTE — Telephone Encounter (Signed)
Denied.  Pt has left the practice.

## 2018-08-05 NOTE — Telephone Encounter (Signed)
Pump start scheduled.  See previous note

## 2018-08-05 NOTE — Telephone Encounter (Signed)
Appointment scheduled for tomorrow to start her pump. Orders received from Dr. Dwyane Dee.

## 2018-08-06 ENCOUNTER — Other Ambulatory Visit: Payer: Self-pay | Admitting: Endocrinology

## 2018-08-06 ENCOUNTER — Encounter: Payer: Medicare Other | Admitting: Nutrition

## 2018-08-06 ENCOUNTER — Other Ambulatory Visit: Payer: Self-pay | Admitting: Adult Health

## 2018-08-06 MED ORDER — INSULIN REGULAR HUMAN (CONC) 500 UNIT/ML ~~LOC~~ SOLN: SUBCUTANEOUS | 1 refills | Status: DC

## 2018-08-06 NOTE — Progress Notes (Signed)
Patient did not start her Pump today because she did not charge her Deliah Boston.  It was charging in the office today, but did not have enough charge to put in settings, even after 45 min.  She was told to go home and charge for 4 hours.  If not charge, she was given the telephone number to call to get a new Dash Appt. Made for tomorrow for training.  We reviewed how to fill and apply a pod, and how to give a bolus on my Dash, but it is not a working Dentist, for her to use.  As a result, she will  Not start this until tomorrow.

## 2018-08-06 NOTE — Patient Instructions (Signed)
Charge PDM for 4 hours.  If Yesenia Wilson is not still not working.  Call the OmniPod help line to get a new Dash.

## 2018-08-07 ENCOUNTER — Encounter: Payer: Medicare Other | Admitting: Nutrition

## 2018-08-07 NOTE — Telephone Encounter (Signed)
Megan,  Looks like this pt has left the practice.  See telephone message from 07/11/2018 created by [facility].  She has a new PCP listed.

## 2018-08-08 ENCOUNTER — Telehealth: Payer: Self-pay | Admitting: Dietician

## 2018-08-08 NOTE — Telephone Encounter (Signed)
Patient has new DASH and is ready for training.  Messaged Vaughan Basta, CDE to call patient to make an appointment.  Antonieta Iba, RD, LDN, CDE

## 2018-08-09 ENCOUNTER — Ambulatory Visit: Payer: Medicare Other | Admitting: Internal Medicine

## 2018-08-16 ENCOUNTER — Other Ambulatory Visit: Payer: Self-pay | Admitting: Endocrinology

## 2018-08-17 ENCOUNTER — Other Ambulatory Visit: Payer: Self-pay | Admitting: Adult Health

## 2018-08-17 DIAGNOSIS — I10 Essential (primary) hypertension: Secondary | ICD-10-CM

## 2018-08-19 ENCOUNTER — Telehealth: Payer: Self-pay | Admitting: Internal Medicine

## 2018-08-19 NOTE — Telephone Encounter (Signed)
Is the PA being done for the insulin pen or the vials?  She will need to use vials for the pump

## 2018-08-19 NOTE — Telephone Encounter (Signed)
I believe he meant to route this to you.

## 2018-08-19 NOTE — Telephone Encounter (Signed)
According to your last office visit noted. The pt is not using a pump.

## 2018-08-19 NOTE — Telephone Encounter (Signed)
Please advise 

## 2018-08-19 NOTE — Telephone Encounter (Signed)
Is patient using humlin in there insulin pump? Please advise with company, thanks August

## 2018-08-19 NOTE — Telephone Encounter (Signed)
Pt is not using insulin via pump. PA has already been submitted to [employer].

## 2018-08-20 NOTE — Telephone Encounter (Signed)
Denied.  Pt has left the practice.  Last refused by [place] Sl[facility].

## 2018-08-21 ENCOUNTER — Encounter: Payer: Medicare Other | Admitting: Nutrition

## 2018-08-29 ENCOUNTER — Telehealth: Payer: Self-pay

## 2018-08-29 NOTE — Telephone Encounter (Signed)
Received a fax from [employer] [place] stating that the pt's initial denial of Humulin R U-500 insulin has been overturned and is now approved for coverage. PA reference number: [id number]

## 2018-08-30 ENCOUNTER — Telehealth: Payer: Self-pay

## 2018-08-30 ENCOUNTER — Telehealth: Payer: Self-pay | Admitting: Internal Medicine

## 2018-08-30 NOTE — Telephone Encounter (Signed)
A representative from [employer] called regarding a pre-auth for a medication - Case # [id number]

## 2018-08-30 NOTE — Telephone Encounter (Signed)
Would you please schedule pt for a follow up appointment? Per Dr. Dwyane Dee

## 2018-09-02 NOTE — Telephone Encounter (Signed)
Dr. Dwyane Dee has refused to do paperwork for pt until she is seen in the office for a follow up appt.

## 2018-09-02 NOTE — Telephone Encounter (Signed)
Patient has been called on 08/30/18 and 09/02/18. She does not answer nor am I able to leave a voicemail b/c it is full.

## 2018-09-03 ENCOUNTER — Telehealth: Payer: Self-pay | Admitting: Nutrition

## 2018-09-03 DIAGNOSIS — M25512 Pain in left shoulder: Secondary | ICD-10-CM | POA: Diagnosis not present

## 2018-09-03 DIAGNOSIS — M19012 Primary osteoarthritis, left shoulder: Secondary | ICD-10-CM | POA: Diagnosis not present
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
## 2018-10-21 ENCOUNTER — Telehealth: Payer: Self-pay | Admitting: Endocrinology

## 2018-10-21 DIAGNOSIS — Z0000 Encounter for general adult medical examination without abnormal findings: Secondary | ICD-10-CM | POA: Diagnosis not present

## 2018-10-21 DIAGNOSIS — I10 Essential (primary) hypertension: Secondary | ICD-10-CM | POA: Diagnosis not present

## 2018-10-21 DIAGNOSIS — E038 Other specified hypothyroidism: Secondary | ICD-10-CM | POA: Diagnosis not present

## 2018-10-21 DIAGNOSIS — E119 Type 2 diabetes mellitus without complications: Secondary | ICD-10-CM | POA: Diagnosis not present

## 2018-10-21 DIAGNOSIS — K210 Gastro-esophageal reflux disease with esophagitis: Secondary | ICD-10-CM | POA: Diagnosis not present

## 2018-10-21 NOTE — Telephone Encounter (Signed)
Patient called re: Patient wants appointment to have [place] start. Please call patient at ph# [phone].

## 2018-10-23 NOTE — Telephone Encounter (Signed)
Pump start scheduled for 2/17

## 2018-10-24 DIAGNOSIS — Z0000 Encounter for general adult medical examination without abnormal findings: Secondary | ICD-10-CM | POA: Diagnosis not present

## 2018-10-24 DIAGNOSIS — I10 Essential (primary) hypertension: Secondary | ICD-10-CM | POA: Diagnosis not present

## 2018-10-24 DIAGNOSIS — K900 Celiac disease: Secondary | ICD-10-CM | POA: Diagnosis not present

## 2018-10-24 DIAGNOSIS — E119 Type 2 diabetes mellitus without complications: Secondary | ICD-10-CM | POA: Diagnosis not present

## 2018-10-24 DIAGNOSIS — K210 Gastro-esophageal reflux disease with esophagitis: Secondary | ICD-10-CM | POA: Diagnosis not present

## 2018-10-24 DIAGNOSIS — E038 Other specified hypothyroidism: Secondary | ICD-10-CM | POA: Diagnosis not present

## 2018-11-01 ENCOUNTER — Telehealth: Payer: Self-pay | Admitting: Endocrinology

## 2018-11-01 DIAGNOSIS — R1310 Dysphagia, unspecified: Secondary | ICD-10-CM | POA: Diagnosis not present

## 2018-11-01 DIAGNOSIS — Z1211 Encounter for screening for malignant neoplasm of colon: Secondary | ICD-10-CM | POA: Diagnosis not present

## 2018-11-01 DIAGNOSIS — R140 Abdominal distension (gaseous): Secondary | ICD-10-CM | POA: Diagnosis not present

## 2018-11-01 DIAGNOSIS — K219 Gastro-esophageal reflux disease without esophagitis: Secondary | ICD-10-CM | POA: Diagnosis not present

## 2018-11-01 NOTE — Telephone Encounter (Signed)
Patient called and stated she did not want to go on a pump, Cancelled all appointment except the one on the 24th.  She said she is on a gluten free diet and sugars are controlled since starting this.

## 2018-11-04 ENCOUNTER — Encounter: Payer: Medicare Other | Admitting: Nutrition

## 2018-11-04 NOTE — Telephone Encounter (Signed)
Noted  

## 2018-11-05 ENCOUNTER — Ambulatory Visit: Payer: Medicare Other | Admitting: Endocrinology

## 2018-11-06 ENCOUNTER — Ambulatory Visit: Payer: Medicare Other | Admitting: Endocrinology

## 2018-11-07 ENCOUNTER — Ambulatory Visit: Payer: Medicare Other | Admitting: Endocrinology

## 2018-11-11 ENCOUNTER — Ambulatory Visit: Payer: Medicare Other | Admitting: Endocrinology

## 2018-11-16 DIAGNOSIS — G4730 Sleep apnea, unspecified: Secondary | ICD-10-CM | POA: Diagnosis not present

## 2018-11-16 DIAGNOSIS — N289 Disorder of kidney and ureter, unspecified: Secondary | ICD-10-CM | POA: Diagnosis not present

## 2018-11-16 DIAGNOSIS — R079 Chest pain, unspecified: Secondary | ICD-10-CM | POA: Diagnosis not present

## 2018-11-16 DIAGNOSIS — R51 Headache: Secondary | ICD-10-CM | POA: Diagnosis not present

## 2018-11-16 DIAGNOSIS — E119 Type 2 diabetes mellitus without complications: Secondary | ICD-10-CM | POA: Diagnosis not present

## 2018-11-16 DIAGNOSIS — E039 Hypothyroidism, unspecified: Secondary | ICD-10-CM | POA: Diagnosis not present

## 2018-11-16 DIAGNOSIS — Z79899 Other long term (current) drug therapy: Secondary | ICD-10-CM | POA: Diagnosis not present

## 2018-11-16 DIAGNOSIS — I4510 Unspecified right bundle-branch block: Secondary | ICD-10-CM | POA: Diagnosis not present

## 2018-11-16 DIAGNOSIS — M797 Fibromyalgia: Secondary | ICD-10-CM | POA: Diagnosis not present

## 2018-11-16 DIAGNOSIS — R0689 Other abnormalities of breathing: Secondary | ICD-10-CM | POA: Diagnosis not present

## 2018-11-16 DIAGNOSIS — R0789 Other chest pain: Secondary | ICD-10-CM | POA: Diagnosis not present

## 2018-11-16 DIAGNOSIS — Z90710 Acquired absence of both cervix and uterus: Secondary | ICD-10-CM | POA: Diagnosis not present

## 2018-11-16 DIAGNOSIS — E785 Hyperlipidemia, unspecified: Secondary | ICD-10-CM | POA: Diagnosis not present

## 2018-11-16 DIAGNOSIS — Z794 Long term (current) use of insulin: Secondary | ICD-10-CM | POA: Diagnosis not present

## 2018-11-16 DIAGNOSIS — R110 Nausea: Secondary | ICD-10-CM | POA: Diagnosis not present

## 2018-11-16 DIAGNOSIS — J449 Chronic obstructive pulmonary disease, unspecified: Secondary | ICD-10-CM | POA: Diagnosis not present

## 2018-11-16 DIAGNOSIS — I209 Angina pectoris, unspecified: Secondary | ICD-10-CM | POA: Diagnosis not present

## 2018-11-16 DIAGNOSIS — Z8249 Family history of ischemic heart disease and other diseases of the circulatory system: Secondary | ICD-10-CM | POA: Diagnosis not present

## 2018-11-16 DIAGNOSIS — Z9049 Acquired absence of other specified parts of digestive tract: Secondary | ICD-10-CM | POA: Diagnosis not present

## 2018-11-16 DIAGNOSIS — K219 Gastro-esophageal reflux disease without esophagitis: Secondary | ICD-10-CM | POA: Diagnosis not present

## 2018-11-16 DIAGNOSIS — I10 Essential (primary) hypertension: Secondary | ICD-10-CM | POA: Diagnosis not present

## 2018-11-17 DIAGNOSIS — E785 Hyperlipidemia, unspecified: Secondary | ICD-10-CM | POA: Diagnosis not present

## 2018-11-17 DIAGNOSIS — I10 Essential (primary) hypertension: Secondary | ICD-10-CM | POA: Diagnosis not present

## 2018-11-17 DIAGNOSIS — N289 Disorder of kidney and ureter, unspecified: Secondary | ICD-10-CM | POA: Diagnosis not present

## 2018-11-17 DIAGNOSIS — I209 Angina pectoris, unspecified: Secondary | ICD-10-CM | POA: Diagnosis not present

## 2018-11-17 DIAGNOSIS — E039 Hypothyroidism, unspecified: Secondary | ICD-10-CM | POA: Diagnosis not present

## 2018-11-18 DIAGNOSIS — I209 Angina pectoris, unspecified: Secondary | ICD-10-CM | POA: Diagnosis not present

## 2018-11-18 DIAGNOSIS — E039 Hypothyroidism, unspecified: Secondary | ICD-10-CM | POA: Diagnosis not present

## 2018-11-18 DIAGNOSIS — N289 Disorder of kidney and ureter, unspecified: Secondary | ICD-10-CM | POA: Diagnosis not present

## 2018-11-18 DIAGNOSIS — I10 Essential (primary) hypertension: Secondary | ICD-10-CM | POA: Diagnosis not present

## 2018-11-18 DIAGNOSIS — E785 Hyperlipidemia, unspecified: Secondary | ICD-10-CM | POA: Diagnosis not present

## 2018-11-28 DIAGNOSIS — E039 Hypothyroidism, unspecified: Secondary | ICD-10-CM | POA: Diagnosis not present

## 2018-11-28 DIAGNOSIS — M810 Age-related osteoporosis without current pathological fracture: Secondary | ICD-10-CM | POA: Diagnosis not present

## 2018-11-28 DIAGNOSIS — E2839 Other primary ovarian failure: Secondary | ICD-10-CM | POA: Diagnosis not present

## 2018-11-29 DIAGNOSIS — Z1231 Encounter for screening mammogram for malignant neoplasm of breast: Secondary | ICD-10-CM | POA: Diagnosis not present

## 2019-02-06 ENCOUNTER — Encounter: Payer: Self-pay | Admitting: Endocrinology

## 2019-02-06 ENCOUNTER — Other Ambulatory Visit: Payer: Self-pay

## 2019-02-06 ENCOUNTER — Encounter: Payer: Medicare Other | Admitting: Endocrinology

## 2019-02-06 NOTE — Progress Notes (Signed)
This encounter was created in error - please disregard.

## 2019-02-07 ENCOUNTER — Telehealth: Payer: Self-pay | Admitting: Endocrinology

## 2019-02-07 NOTE — Telephone Encounter (Signed)
Per Dr Dwyane Dee no showed 02/06/2019 appointment.  Attempted to call and reschedule, no answer and no way to leave voice mail.

## 2019-02-09 ENCOUNTER — Other Ambulatory Visit: Payer: Self-pay | Admitting: Endocrinology

## 2019-02-11 ENCOUNTER — Telehealth: Payer: Self-pay

## 2019-02-11 NOTE — Telephone Encounter (Signed)
Overdue for an appt with Dr. Dwyane Dee. Unable to LVM d/t VM being full.

## 2019-02-12 ENCOUNTER — Telehealth: Payer: Self-pay

## 2019-02-12 NOTE — Telephone Encounter (Signed)
Overdue for an appt with Dr. Dwyane Dee. Unable to LVM d/t VM being full.

## 2019-02-13 ENCOUNTER — Telehealth: Payer: Self-pay

## 2019-02-13 NOTE — Telephone Encounter (Signed)
Overdue for an appt with Dr. Dwyane Dee. Unable to LVM d/t VM being full. Letter mailed

## 2019-02-24 ENCOUNTER — Other Ambulatory Visit: Payer: Self-pay | Admitting: Endocrinology

## 2019-02-27 DIAGNOSIS — I10 Essential (primary) hypertension: Secondary | ICD-10-CM | POA: Diagnosis not present

## 2019-02-27 DIAGNOSIS — M25519 Pain in unspecified shoulder: Secondary | ICD-10-CM | POA: Diagnosis not present

## 2019-02-27 DIAGNOSIS — E1165 Type 2 diabetes mellitus with hyperglycemia: Secondary | ICD-10-CM | POA: Diagnosis not present

## 2019-02-27 DIAGNOSIS — E038 Other specified hypothyroidism: Secondary | ICD-10-CM | POA: Diagnosis not present

## 2019-02-27 DIAGNOSIS — K210 Gastro-esophageal reflux disease with esophagitis: Secondary | ICD-10-CM | POA: Diagnosis not present

## 2019-03-17 ENCOUNTER — Other Ambulatory Visit: Payer: Self-pay

## 2019-03-17 ENCOUNTER — Encounter (HOSPITAL_COMMUNITY): Payer: Self-pay

## 2019-03-17 ENCOUNTER — Emergency Department (HOSPITAL_COMMUNITY)
Admission: EM | Admit: 2019-03-17 | Discharge: 2019-03-17 | Disposition: A | Discharge status: Home / Self Care | Payer: Medicare Other | Attending: Emergency Medicine | Admitting: Emergency Medicine

## 2019-03-17 ENCOUNTER — Emergency Department (HOSPITAL_COMMUNITY): Payer: Medicare Other

## 2019-03-17 DIAGNOSIS — I10 Essential (primary) hypertension: Secondary | ICD-10-CM | POA: Diagnosis not present

## 2019-03-17 DIAGNOSIS — Z79899 Other long term (current) drug therapy: Secondary | ICD-10-CM | POA: Diagnosis not present

## 2019-03-17 DIAGNOSIS — R1013 Epigastric pain: Secondary | ICD-10-CM | POA: Diagnosis not present

## 2019-03-17 DIAGNOSIS — Z86718 Personal history of other venous thrombosis and embolism: Secondary | ICD-10-CM | POA: Diagnosis not present

## 2019-03-17 DIAGNOSIS — M3210 Systemic lupus erythematosus, organ or system involvement unspecified: Secondary | ICD-10-CM | POA: Insufficient documentation

## 2019-03-17 DIAGNOSIS — R42 Dizziness and giddiness: Secondary | ICD-10-CM | POA: Diagnosis not present

## 2019-03-17 DIAGNOSIS — J449 Chronic obstructive pulmonary disease, unspecified: Secondary | ICD-10-CM | POA: Diagnosis not present

## 2019-03-17 DIAGNOSIS — Z20828 Contact with and (suspected) exposure to other viral communicable diseases: Secondary | ICD-10-CM | POA: Diagnosis not present

## 2019-03-17 DIAGNOSIS — M797 Fibromyalgia: Secondary | ICD-10-CM | POA: Insufficient documentation

## 2019-03-17 DIAGNOSIS — R05 Cough: Secondary | ICD-10-CM | POA: Diagnosis not present

## 2019-03-17 DIAGNOSIS — N281 Cyst of kidney, acquired: Secondary | ICD-10-CM | POA: Diagnosis not present

## 2019-03-17 DIAGNOSIS — R109 Unspecified abdominal pain: Secondary | ICD-10-CM | POA: Diagnosis not present

## 2019-03-17 DIAGNOSIS — R531 Weakness: Secondary | ICD-10-CM | POA: Diagnosis not present

## 2019-03-17 DIAGNOSIS — Z794 Long term (current) use of insulin: Secondary | ICD-10-CM | POA: Insufficient documentation

## 2019-03-17 DIAGNOSIS — R112 Nausea with vomiting, unspecified: Secondary | ICD-10-CM

## 2019-03-17 DIAGNOSIS — R0602 Shortness of breath: Secondary | ICD-10-CM | POA: Diagnosis not present

## 2019-03-17 DIAGNOSIS — E039 Hypothyroidism, unspecified: Secondary | ICD-10-CM | POA: Diagnosis not present

## 2019-03-17 DIAGNOSIS — E119 Type 2 diabetes mellitus without complications: Secondary | ICD-10-CM | POA: Diagnosis not present

## 2019-03-17 DIAGNOSIS — I951 Orthostatic hypotension: Secondary | ICD-10-CM | POA: Diagnosis not present

## 2019-03-17 DIAGNOSIS — J40 Bronchitis, not specified as acute or chronic: Secondary | ICD-10-CM | POA: Diagnosis not present

## 2019-03-17 LAB — COMPREHENSIVE METABOLIC PANEL
ALT: 31 U/L (ref 0–44)
AST: 23 U/L (ref 15–41)
Albumin: 4.2 g/dL (ref 3.5–5.0)
Alkaline Phosphatase: 116 U/L (ref 38–126)
Anion gap: 15 (ref 5–15)
BUN: 28 mg/dL — ABNORMAL HIGH (ref 8–23)
CO2: 26 mmol/L (ref 22–32)
Calcium: 9.9 mg/dL (ref 8.9–10.3)
Chloride: 97 mmol/L — ABNORMAL LOW (ref 98–111)
Creatinine, Ser: 1.45 mg/dL — ABNORMAL HIGH (ref 0.44–1.00)
GFR calc Af Amer: 44 mL/min — ABNORMAL LOW (ref >60)
GFR calc non Af Amer: 38 mL/min — ABNORMAL LOW (ref >60)
Glucose, Bld: 254 mg/dL — ABNORMAL HIGH (ref 70–99)
Potassium: 3.9 mmol/L (ref 3.5–5.1)
Sodium: 138 mmol/L (ref 135–145)
Total Bilirubin: 0.7 mg/dL (ref 0.3–1.2)
Total Protein: 7.8 g/dL (ref 6.5–8.1)

## 2019-03-17 LAB — CBC WITH DIFFERENTIAL/PLATELET
Abs Immature Granulocytes: 0.1 10*3/uL — ABNORMAL HIGH (ref 0.00–0.07)
Basophils Absolute: 0.1 10*3/uL (ref 0.0–0.1)
Basophils Relative: 1 %
Eosinophils Absolute: 0 10*3/uL (ref 0.0–0.5)
Eosinophils Relative: 0 %
HCT: 47.2 % — ABNORMAL HIGH (ref 36.0–46.0)
Hemoglobin: 15.1 g/dL — ABNORMAL HIGH (ref 12.0–15.0)
Immature Granulocytes: 1 %
Lymphocytes Relative: 17 %
Lymphs Abs: 2.3 10*3/uL (ref 0.7–4.0)
MCH: 29.2 pg (ref 26.0–34.0)
MCHC: 32 g/dL (ref 30.0–36.0)
MCV: 91.3 fL (ref 80.0–100.0)
Monocytes Absolute: 0.7 10*3/uL (ref 0.1–1.0)
Monocytes Relative: 5 %
Neutro Abs: 10.5 10*3/uL — ABNORMAL HIGH (ref 1.7–7.7)
Neutrophils Relative %: 76 %
Platelets: 312 10*3/uL (ref 150–400)
RBC: 5.17 MIL/uL — ABNORMAL HIGH (ref 3.87–5.11)
RDW: 13.5 % (ref 11.5–15.5)
WBC: 13.6 10*3/uL — ABNORMAL HIGH (ref 4.0–10.5)
nRBC: 0 % (ref 0.0–0.2)

## 2019-03-17 LAB — URINALYSIS, ROUTINE W REFLEX MICROSCOPIC
Bilirubin Urine: NEGATIVE
Glucose, UA: 50 mg/dL — AB
Hgb urine dipstick: NEGATIVE
Ketones, ur: NEGATIVE mg/dL
Leukocytes,Ua: NEGATIVE
Nitrite: NEGATIVE
Protein, ur: NEGATIVE mg/dL
Specific Gravity, Urine: >1.046 — ABNORMAL HIGH (ref 1.005–1.030)
pH: 5 (ref 5.0–8.0)

## 2019-03-17 LAB — LIPASE, BLOOD: Lipase: 26 U/L (ref 11–51)

## 2019-03-17 LAB — TROPONIN I (HIGH SENSITIVITY)
Troponin I (High Sensitivity): 268 ng/L (ref <18)
Troponin I (High Sensitivity): 4 ng/L (ref <18)
Troponin I (High Sensitivity): 3 ng/L (ref <18)

## 2019-03-17 LAB — SARS CORONAVIRUS 2 BY RT PCR (HOSPITAL ORDER, PERFORMED IN ~~LOC~~ HOSPITAL LAB): SARS Coronavirus 2: NEGATIVE

## 2019-03-17 MED ORDER — IOHEXOL 350 MG/ML SOLN
100.0000 mL | Freq: Once | INTRAVENOUS | Status: AC | PRN
  Administered 2019-03-17: 75 mL via INTRAVENOUS

## 2019-03-17 MED ORDER — SODIUM CHLORIDE 0.9 % IV BOLUS
1000.0000 mL | Freq: Once | INTRAVENOUS | Status: AC
  Administered 2019-03-17: 1000 mL via INTRAVENOUS

## 2019-03-17 MED ORDER — ONDANSETRON HCL 4 MG/2ML IJ SOLN
4.0000 mg | Freq: Once | INTRAMUSCULAR | Status: AC
  Administered 2019-03-17: 4 mg via INTRAVENOUS
  Filled 2019-03-17: qty 2

## 2019-03-17 NOTE — ED Notes (Signed)
Date and time results received: 03/17/19 2:36 PM  (use smartphrase ".now" to insert current time)  Test: Troponin Critical Value: 268  Name of Provider Notified: Regenia Skeeter  Orders Received? Or Actions Taken?: Orders Received - See Orders for details

## 2019-03-17 NOTE — ED Notes (Signed)
Pt ambulated in hall, stand by assist, steady gait

## 2019-03-17 NOTE — ED Provider Notes (Signed)
[facility] EMERGENCY DEPARTMENT Provider Note   CSN: [id number] Arrival date & time: 03/17/19  1219    History   Chief Complaint Chief Complaint  Patient presents with   Emesis    HPI Yesenia Wilson is a 63 y.o. female with a PMH of COPD, DVT, HTN, Lupus, Pancreatitis, Asthma, Anxiety, and Diabetes presenting with nausea, vomiting, and epigastric abdominal pain radiating to back onset this morning at 9am. Patient reports pain began when she woke up. Patient reports multiple episodes of vomiting. Patient reports dizziness that she describes as lightheadedness with standing. Patient reports presyncope with standing. Patient denies diarrhea or constipation. Patient reports LBM was today and it was normal. Patient reports a productive cough and rhinorrhea onset 2 weeks ago. Patient states she was prescribed antibiotics and prednisone with relief and completed medications 2 days ago. Patient is unsure if she has had sick contacts. Patient reports intermittent shortness of breath, diaphoresis, and generalized weakness. Patient denies vision changes, syncope, injury, numbness, paresthesias, or facial asymmetry. Patient denies fever, chest pain, leg edema, or palpitations.  Patient reports compliance with her medications. Patient denies alcohol, tobacco, or drug use. Patient reports she has had a cholecystectomy, appendectomy, and c-sections in the past.      HPI  Past Medical History:  Diagnosis Date   Anxiety    Asthma    Clotting disorder ([place])    DVT left leg between ankle and knee due to tennis injury   COPD (chronic obstructive pulmonary disease) ([place])    Depression    Diabetes mellitus    DVT (deep venous thrombosis) (H[facility])    left leg   Fibromyalgia    GERD (gastroesophageal reflux disease)    Heart murmur    Hiatal hernia    Hyperlipidemia    Hypertension    Hypothyroidism    Lupus ([place])    Neuromuscular disorder ([place])    fibromyalgia   Pancreatitis     Pneumonia     Patient Active Problem List   Diagnosis Date Noted   Routine screening for STI (sexually transmitted infection) 10/10/2017   Unstable angina ([place])    Chest pain 11/20/2016   Urinary urgency 03/22/2016   Rosacea 01/31/2016   GERD (gastroesophageal reflux disease) 12/13/2015   Obesity (BMI 30-39.9) 06/25/2013   Recurrent depression ([place]) 03/02/2012   EDEMA 05/24/2010   BACK PAIN, LUMBAR 03/24/2010   DEEP VENOUS THROMBOPHLEBITIS, LEG 01/03/2010   ALOPECIA 08/03/2009   Poorly controlled type 2 diabetes mellitus ([place]) 03/03/2009   Sleep apnea 11/14/2008   FIBROMYALGIA 08/25/2008   DYSPNEA 07/21/2008   Hypothyroidism 07/18/2008   Hyperlipidemia 07/18/2008   Essential hypertension 07/18/2008   ASTHMA 07/18/2008    Past Surgical History:  Procedure Laterality Date   APPENDECTOMY     bladder tack     CESAREAN SECTION     CHOLECYSTECTOMY     COLONOSCOPY     12 yrs ago   LEFT HEART CATH AND CORONARY ANGIOGRAPHY N/A 11/22/2016   Procedure: Left Heart Cath and Coronary Angiography;  Surgeon: Burnell Blanks, MD;  Location: [facility];  Service: Cardiovascular;  Laterality: N/A;   SEPTOPLASTY       OB History    Gravida  2   Para  2   Term      Preterm      AB      Living        SAB      TAB      Ectopic  Multiple      Live Births               Home Medications    Prior to Admission medications   Medication Sig Start Date End Date Taking? Authorizing Provider  amLODipine (NORVASC) 5 MG tablet Take 1 tablet (5 mg total) by mouth daily. 07/09/18  Yes Nafziger, Tommi Rumps, NP  ARIPiprazole (ABILIFY) 2 MG tablet TAKE 1 TABLET (2 MG TOTAL) BY MOUTH AT BEDTIME. 01/14/18  Yes Burchette, Alinda Sierras, MD  aspirin 81 MG tablet Take 81 mg by mouth daily.     Yes [provider]  BD PEN NEEDLE NANO U/F 32G X 4 MM MISC USE 3 PER TO DAY TO INJECT NOVOLOG 01/18/17  Yes [provider]  chlorthalidone  (HYGROTON) 25 MG tablet Take 25 mg by mouth daily. 02/16/19  Yes [provider]  DULoxetine (CYMBALTA) 60 MG capsule TAKE 1 CAPSULE (60 MG TOTAL) BY MOUTH DAILY. 12/17/17  Yes Burchette, Alinda Sierras, MD  ibuprofen (ADVIL,MOTRIN) 800 MG tablet Take 1 tablet (800 mg total) by mouth every 8 (eight) hours as needed for up to 30 doses. 07/09/18  Yes Nafziger, Tommi Rumps, NP  Insulin Degludec (TRESIBA) 100 UNIT/ML SOLN Inject 40 Units into the skin at bedtime.   Yes [provider]  Insulin Pen Needle (BD PEN NEEDLE NANO U/F) 32G X 4 MM MISC Use as directed to inject insulin 4 times daily. 06/18/18  Yes Elayne Snare, MD  insulin regular human CONCENTRATED (HUMULIN R) 500 UNIT/ML injection Fill insulin pump with 2 mL every 3 days Patient taking differently: Inject 50 Units into the skin 3 (three) times daily with meals.  08/06/18  Yes Elayne Snare, MD  irbesartan-hydrochlorothiazide (AVALIDE) 300-12.5 MG tablet Take 1 tablet by mouth daily. 07/09/18 03/17/19 Yes Nafziger, Tommi Rumps, NP  levothyroxine (SYNTHROID, LEVOTHROID) 200 MCG tablet Take 200 mcg by mouth daily. 07/17/18  Yes [provider]  metFORMIN (GLUCOPHAGE) 1000 MG tablet TAKE 1 TABLET BY MOUTH TWICE A DAY 03/25/18  Yes Elayne Snare, MD  metoprolol succinate (TOPROL-XL) 25 MG 24 hr tablet Take 25 mg by mouth daily. 07/22/18  Yes [provider]  Multiple Vitamins-Minerals (MULTIVITAMIN WOMEN 50+ PO) Take 1 tablet by mouth daily.   Yes [provider]  [facility] DELICA LANCETS 96Q MISC USE TO CHECK BLOOD SUGAR 3 TIMES PER DAY. DX CODE E11.9 04/26/18  Yes Elayne Snare, MD  [facility] VERIO test strip USE TO TEST BLOOD SUGAR 3 TIMES A DAY DX CODE E11.9 02/24/19  Yes Elayne Snare, MD  pantoprazole (PROTONIX) 40 MG tablet TAKE 1 TABLET BY MOUTH TWICE A DAY 03/25/18  Yes Ladene Artist, MD  rosuvastatin (CRESTOR) 20 MG tablet TAKE 1 TABLET BY MOUTH EVERY DAY 08/16/18  Yes Elayne Snare, MD  valACYclovir (VALTREX) 1000 MG tablet TAKE 2 AT  ONSET OF COLD SORE AND REPEAT 2 IN 12 HOURS. 11/15/17  Yes Burchette, Alinda Sierras, MD    Family History Family History  Adopted: Yes  Problem Relation Age of Onset   Diabetes Mellitus II Mother    Colon polyps Mother    Peptic Ulcer Mother    CAD Father    Diabetes Mellitus II Father    CAD Sister    Diabetes Mellitus II Sister    CAD Brother    Diabetes Mellitus II Brother    Breast cancer Maternal Grandmother    Stomach cancer Maternal Grandmother    Colon cancer Maternal Grandmother    Cirrhosis Cousin  x 2   Esophageal cancer Neg Hx    Rectal cancer Neg Hx     Social History Social History   Tobacco Use   Smoking status: Never Smoker   Smokeless tobacco: Never Used  Substance Use Topics   Alcohol use: Yes    Comment: Once every 2-3 months    Drug use: No     Allergies   Oxycodone-acetaminophen and Propoxyphene n-acetaminophen   Review of Systems Review of Systems  Constitutional: Positive for diaphoresis. Negative for activity change, appetite change, chills, fever and unexpected weight change.  HENT: Positive for rhinorrhea. Negative for congestion and sore throat.   Eyes: Negative for visual disturbance.  Respiratory: Positive for cough and shortness of breath.   Cardiovascular: Negative for chest pain.  Gastrointestinal: Positive for abdominal pain, nausea and vomiting. Negative for blood in stool, constipation and diarrhea.  Endocrine: Negative for polydipsia, polyphagia and polyuria.  Genitourinary: Negative for dysuria, flank pain and frequency.  Musculoskeletal: Positive for back pain. Negative for myalgias, neck pain and neck stiffness.  Skin: Negative for rash and wound.  Neurological: Positive for dizziness, weakness and light-headedness. Negative for tremors, seizures, syncope, facial asymmetry, speech difficulty, numbness and headaches.  Hematological: Does not bruise/bleed easily.  Psychiatric/Behavioral: The patient is not  nervous/anxious.      Physical Exam Updated Vital Signs BP 111/61    Pulse 75    Temp 97.7 F (36.5 C) (Oral)    Resp 14    Ht 5\' 3"  (1.6 m)    Wt 90.7 kg    SpO2 96%    BMI 35.43 kg/m   Physical Exam Vitals signs and nursing note reviewed.  Constitutional:      General: She is not in acute distress.    Appearance: She is well-developed. She is not diaphoretic.  HENT:     Head: Normocephalic and atraumatic.     Mouth/Throat:     Mouth: Mucous membranes are dry.     Pharynx: No oropharyngeal exudate or posterior oropharyngeal erythema.  Eyes:     Extraocular Movements: Extraocular movements intact.     Conjunctiva/sclera: Conjunctivae normal.     Pupils: Pupils are equal, round, and reactive to light.  Neck:     Musculoskeletal: Normal range of motion.  Cardiovascular:     Rate and Rhythm: Normal rate and regular rhythm.     Heart sounds: Normal heart sounds. No murmur. No friction rub. No gallop.   Pulmonary:     Effort: Pulmonary effort is normal. No respiratory distress.     Breath sounds: Normal breath sounds. No wheezing or rales.  Abdominal:     General: Abdomen is protuberant.     Palpations: Abdomen is soft.     Tenderness: There is abdominal tenderness in the epigastric area. There is no right CVA tenderness, left CVA tenderness, guarding or rebound.  Musculoskeletal: Normal range of motion.  Skin:    General: Skin is warm.     Findings: No erythema or rash.  Neurological:     Mental Status: She is alert.    Mental Status:  Alert, oriented, thought content appropriate, able to give a coherent history. Speech fluent without evidence of aphasia. Able to follow 2 step commands without difficulty.  Cranial Nerves:  II:  Peripheral visual fields grossly normal, pupils equal, round, reactive to light III,IV, VI: ptosis not present, extra-ocular motions intact bilaterally  V,VII: smile symmetric, facial light touch sensation equal VIII: hearing grossly normal to  voice  IX,X: symmetric elevation of soft palate, uvula elevates symmetrically  XI: bilateral shoulder shrug symmetric and strong XII: midline tongue extension without fassiculations Motor:  Normal tone. 5/5 in upper and lower extremities bilaterally including strong and equal grip strength and dorsiflexion/plantar flexion Sensory: light touch normal in all extremities.  Deep Tendon Reflexes: 2+ and symmetric in the biceps and patella Cerebellar: normal finger-to-nose with bilateral upper extremities Gait: Patient is not able to ambulate at this time due to lightheadedness with standing. Patient has positive orthostatic vitals. Negative pronator drift. Negative Romberg sign. CV: distal pulses palpable throughout    ED Treatments / Results  Labs (all labs ordered are listed, but only abnormal results are displayed) Labs Reviewed  CBC WITH DIFFERENTIAL/PLATELET - Abnormal; Notable for the following components:      Result Value   WBC 13.6 (*)    RBC 5.17 (*)    Hemoglobin 15.1 (*)    HCT 47.2 (*)    Neutro Abs 10.5 (*)    Abs Immature Granulocytes 0.10 (*)    All other components within normal limits  COMPREHENSIVE METABOLIC PANEL - Abnormal; Notable for the following components:   Chloride 97 (*)    Glucose, Bld 254 (*)    BUN 28 (*)    Creatinine, Ser 1.45 (*)    GFR calc non Af Amer 38 (*)    GFR calc Af Amer 44 (*)    All other components within normal limits  URINALYSIS, ROUTINE W REFLEX MICROSCOPIC - Abnormal; Notable for the following components:   Color, Urine AMBER (*)    APPearance CLOUDY (*)    Specific Gravity, Urine >1.046 (*)    Glucose, UA 50 (*)    All other components within normal limits  TROPONIN I (HIGH SENSITIVITY) - Abnormal; Notable for the following components:   Troponin I (High Sensitivity) 268.00 (*)    All other components within normal limits  SARS CORONAVIRUS 2 (HOSPITAL ORDER, [place] [facility])  LIPASE, BLOOD  TROPONIN I  (HIGH SENSITIVITY)  TROPONIN I (HIGH SENSITIVITY)    EKG EKG Interpretation  Date/Time:  Monday March 17 2019 13:18:57 EDT Ventricular Rate:  91 PR Interval:    QRS Duration: 131 QT Interval:  469 QTC Calculation: 535 R Axis:   84 Text Interpretation:  Sinus rhythm Atrial premature complexes Right bundle branch block tachycardia no longer present compared to May 2019 Confirmed by Sherwood Gambler [phone]) on 03/17/2019 1:55:32 PM   Radiology Ct Angio Chest Pe W/cm &/or Wo Cm  Result Date: 03/17/2019 CLINICAL DATA:  63 year old female with nausea, vomiting, low back and abdominal pain. Productive cough for 1.5 weeks, finished antibiotics 2 days ago. EXAM: CT ANGIOGRAPHY CHEST CT ABDOMEN AND PELVIS WITH CONTRAST TECHNIQUE: Multidetector CT imaging of the chest was performed using the standard protocol during bolus administration of intravenous contrast. Multiplanar CT image reconstructions and MIPs were obtained to evaluate the vascular anatomy. Multidetector CT imaging of the abdomen and pelvis was performed using the standard protocol during bolus administration of intravenous contrast. CONTRAST:  86mL OMNIPAQUE IOHEXOL 350 MG/ML SOLN COMPARISON:  [facility] CT Abdomen and Pelvis 02/27/2018 and earlier. Portable chest earlier today. FINDINGS: CTA CHEST FINDINGS Cardiovascular: Good contrast bolus timing in the pulmonary arterial tree. No focal filling defect identified in the pulmonary arteries to suggest acute pulmonary embolism. no calcified coronary artery atherosclerosis is evident. Mild Calcified aortic atherosclerosis. No cardiomegaly or pericardial effusion. Mediastinum/Nodes: Negative aside from small mostly fat containing hiatal  hernia. No mediastinal lymphadenopathy. Lungs/Pleura: Major airways are patent. Mild dependent atelectasis in both lower lobes. Otherwise negative lungs. No pleural effusion. Musculoskeletal: No acute osseous abnormality identified. Review of the MIP  images confirms the above findings. CT ABDOMEN and PELVIS FINDINGS Hepatobiliary: Surgically absent gallbladder as before. Borderline a patent steatosis. No bile duct enlargement. Pancreas: Negative. Spleen: Negative. Adrenals/Urinary Tract: Normal adrenal glands. Bilateral renal enhancement and contrast excretion is symmetric and normal. Small benign left renal midpole cyst redemonstrated. Decompressed and negative ureters. Stable and negative urinary bladder. Stomach/Bowel: Decompressed rectum and distal sigmoid. Mild diverticulosis of the distal descending and proximal sigmoid colon, no active inflammation identified. Mild retained stool and colonic redundancy. Fluid in the right colon. Occasional right colon diverticula. The cecum is on a lax mesentery. Diminutive or absent appendix. Negative terminal ileum. No dilated small bowel. No mesenteric stranding. Decompressed and negative stomach. No free air, free fluid. Vascular/Lymphatic: Mild Calcified aortic atherosclerosis. Major arterial structures are patent. Portal venous system appears to be patent. No lymphadenopathy. Reproductive: Surgically absent uterus and diminutive or absent ovaries. Other: No pelvic free fluid. Musculoskeletal: Chronic lower lumbar fusion. No acute osseous abnormality identified. Review of the MIP images confirms the above findings. IMPRESSION: 1. Negative for acute pulmonary embolus. 2. No acute or inflammatory process identified in the chest, abdomen, or pelvis. 3. Mild pulmonary atelectasis. Mild aortic atherosclerosis. Electronically Signed   By: Genevie Ann M.D.   On: 03/17/2019 17:15   Ct Abdomen Pelvis W Contrast  Result Date: 03/17/2019 CLINICAL DATA:  63 year old female with nausea, vomiting, low back and abdominal pain. Productive cough for 1.5 weeks, finished antibiotics 2 days ago. EXAM: CT ANGIOGRAPHY CHEST CT ABDOMEN AND PELVIS WITH CONTRAST TECHNIQUE: Multidetector CT imaging of the chest was performed using the  standard protocol during bolus administration of intravenous contrast. Multiplanar CT image reconstructions and MIPs were obtained to evaluate the vascular anatomy. Multidetector CT imaging of the abdomen and pelvis was performed using the standard protocol during bolus administration of intravenous contrast. CONTRAST:  46mL OMNIPAQUE IOHEXOL 350 MG/ML SOLN COMPARISON:  [facility] CT Abdomen and Pelvis 02/27/2018 and earlier. Portable chest earlier today. FINDINGS: CTA CHEST FINDINGS Cardiovascular: Good contrast bolus timing in the pulmonary arterial tree. No focal filling defect identified in the pulmonary arteries to suggest acute pulmonary embolism. no calcified coronary artery atherosclerosis is evident. Mild Calcified aortic atherosclerosis. No cardiomegaly or pericardial effusion. Mediastinum/Nodes: Negative aside from small mostly fat containing hiatal hernia. No mediastinal lymphadenopathy. Lungs/Pleura: Major airways are patent. Mild dependent atelectasis in both lower lobes. Otherwise negative lungs. No pleural effusion. Musculoskeletal: No acute osseous abnormality identified. Review of the MIP images confirms the above findings. CT ABDOMEN and PELVIS FINDINGS Hepatobiliary: Surgically absent gallbladder as before. Borderline a patent steatosis. No bile duct enlargement. Pancreas: Negative. Spleen: Negative. Adrenals/Urinary Tract: Normal adrenal glands. Bilateral renal enhancement and contrast excretion is symmetric and normal. Small benign left renal midpole cyst redemonstrated. Decompressed and negative ureters. Stable and negative urinary bladder. Stomach/Bowel: Decompressed rectum and distal sigmoid. Mild diverticulosis of the distal descending and proximal sigmoid colon, no active inflammation identified. Mild retained stool and colonic redundancy. Fluid in the right colon. Occasional right colon diverticula. The cecum is on a lax mesentery. Diminutive or absent appendix. Negative  terminal ileum. No dilated small bowel. No mesenteric stranding. Decompressed and negative stomach. No free air, free fluid. Vascular/Lymphatic: Mild Calcified aortic atherosclerosis. Major arterial structures are patent. Portal venous system appears to be patent. No lymphadenopathy.  Reproductive: Surgically absent uterus and diminutive or absent ovaries. Other: No pelvic free fluid. Musculoskeletal: Chronic lower lumbar fusion. No acute osseous abnormality identified. Review of the MIP images confirms the above findings. IMPRESSION: 1. Negative for acute pulmonary embolus. 2. No acute or inflammatory process identified in the chest, abdomen, or pelvis. 3. Mild pulmonary atelectasis.   Mild aortic atherosclerosis. Electronically Signed   By: Genevie Ann M.D.   On: 03/17/2019 17:08   Dg Chest Port 1 View  Result Date: 03/17/2019 CLINICAL DATA:  Bronchitis. EXAM: PORTABLE CHEST 1 VIEW COMPARISON:  Chest x-ray 11/16/2018. FINDINGS: Mediastinum and hilar structures normal. Heart size stable. No pulmonary venous congestion. No focal infiltrate. No pleural effusion or pneumothorax. IMPRESSION: No acute cardiopulmonary disease. Electronically Signed   By: Marcello Moores  Register   On: 03/17/2019 13:58    Procedures Procedures (including critical care time)  Medications Ordered in ED Medications  sodium chloride 0.9 % bolus 1,000 mL (0 mLs Intravenous Stopped 03/17/19 1320)  ondansetron (ZOFRAN) injection 4 mg (4 mg Intravenous Given 03/17/19 1320)  iohexol (OMNIPAQUE) 350 MG/ML injection 100 mL (75 mLs Intravenous Contrast Given 03/17/19 1630)  sodium chloride 0.9 % bolus 1,000 mL (0 mLs Intravenous Stopped 03/17/19 1855)     Initial Impression / Assessment and Plan / ED Course  I have reviewed the triage vital signs and the nursing notes.  Pertinent labs & imaging results that were available during my care of the patient were reviewed by me and considered in my medical decision making (see chart for  details).  Clinical Course as of Mar 16 2024  Mon Mar 17, 2019  1402 No acute cardiopulmonary disease.  DG Chest Port 1 View [AH]  1402 Leukocytosis noted at 13.6.  WBC(!): 13.6 [AH]  1434 Lipase is within normal limits.  Lipase, blood [AH]  1434 Elevated creatinine at 1.45.   Creatinine(!): 1.45 [AH]  1454 Troponin elevated at 268.   Troponin I (High Sensitivity)(!!) [AH]  1735 1. Negative for acute pulmonary embolus. 2. No acute or inflammatory process identified in the chest, abdomen, or pelvis. 3. Mild pulmonary atelectasis. Mild aortic atherosclerosis.    CT Angio Chest PE W/Cm &/Or Wo Cm [AH]  1757 Second troponin was 4. Ordered recheck due to highly inconsistent values. Third troponin was 3. First troponin appears to be an error.  Troponin I (High Sensitivity) [AH]  1833 Reassessed patient. Patient states symptoms have significantly improved while in the ER. Patient denies abdominal pain, nausea, or vomiting at this time.    [AH]  1945 Patient was able to eat, drink, and ambulate down the hall without difficulty.    [AH]  1948 Blood pressure has significantly improved with IVF.   [AH]    Clinical Course User Index [AH] Arville Lime, PA-C      Patient presents with nausea, vomiting, and epigastric abdominal pain. Patient also had lightheadedness and presyncope. Orthostatic vitals were positive, likely due to vomiting episodes. Provided IVF and zofran in the ER with resolution of all symptoms. Patient is nontoxic, nonseptic appearing, in no apparent distress.  Patient's pain and other symptoms adequately managed in emergency department.  Labs, imaging and vitals reviewed.  CTA is negative for PE. CT abdomen reveals no acute or inflammatory process. Lipase was within normal limits. Patient does not meet the SIRS or Sepsis criteria.  On repeat exam patient's symptoms have completely resolved and patient does not have a surgical abdomin and there are no peritoneal signs.  No  indication  of appendicitis, bowel obstruction, bowel perforation, cholecystitis, or diverticulitis. Patient discharged home with symptomatic treatment and given strict instructions for follow-up with their primary care physician.  I have also discussed reasons to return immediately to the ER.  Patient expresses understanding and agrees with plan.   Findings and plan of care discussed with supervising physician Dr. Eulis Foster.  Final Clinical Impressions(s) / ED Diagnoses   Final diagnoses:  Epigastric pain  Nausea and vomiting, intractability of vomiting not specified, unspecified vomiting type  Orthostatic hypotension    ED Discharge Orders    None       Arville Lime, PA-C 03/17/19 2026    Daleen Bo, MD 03/18/19 [phone]

## 2019-03-17 NOTE — Discharge Instructions (Signed)
You have been seen today for nausea, vomiting, abdominal pain, and lightheadedness. Please read and follow all provided instructions.   1. Medications: usual home medications 2. Treatment: rest, drink plenty of fluids, advance diet as tolerated slowly 3. Follow Up: Please follow up with your primary doctor in 2 days for discussion of your diagnoses and further evaluation after today's visit; if you do not have a primary care doctor use the resource guide provided to find one; Please return to the ER for any new or worsening symptoms. Please obtain all of your results from medical records or have your doctors office obtain the results - share them with your doctor - you should be seen at your doctors office. Call today to arrange your follow up.   Take medications as prescribed. Please review all of the medicines and only take them if you do not have an allergy to them. Return to the emergency room for worsening condition or new concerning symptoms. Follow up with your regular doctor. If you don't have a regular doctor use one of the numbers below to establish a primary care doctor.  Please be aware that if you are taking birth control pills, taking other prescriptions, ESPECIALLY ANTIBIOTICS may make the birth control ineffective - if this is the case, either do not engage in sexual activity or use alternative methods of birth control such as condoms until you have finished the medicine and your family doctor says it is OK to restart them. If you are on a blood thinner such as COUMADIN, be aware that any other medicine that you take may cause the coumadin to either work too much, or not enough - you should have your coumadin level rechecked in next 7 days if this is the case.  ?  It is also a possibility that you have an allergic reaction to any of the medicines that you have been prescribed - Everybody reacts differently to medications and while MOST people have no trouble with most medicines, you may  have a reaction such as nausea, vomiting, rash, swelling, shortness of breath. If this is the case, please stop taking the medicine immediately and contact your physician.  ?  You should return to the ER if you develop severe or worsening symptoms.   Emergency Department Resource Guide 1) Find a Doctor and Pay Out of Pocket Although you won't have to find out who is covered by your insurance plan, it is a good idea to ask around and get recommendations. You will then need to call the office and see if the doctor you have chosen will accept you as a new patient and what types of options they offer for patients who are self-pay. Some doctors offer discounts or will set up payment plans for their patients who do not have insurance, but you will need to ask so you aren't surprised when you get to your appointment.  2) Contact Your Local Health Department Not all health departments have doctors that can see patients for sick visits, but many do, so it is worth a call to see if yours does. If you don't know where your local health department is, you can check in your phone book. The CDC also has a tool to help you locate your state's health department, and many state websites also have listings of all of their local health departments.  3) Find a [facility] If your illness is not likely to be very severe or complicated, you may want to try a  walk in clinic. These are popping up all over the country in pharmacies, drugstores, and shopping centers. They're usually staffed by nurse practitioners or physician assistants that have been trained to treat common illnesses and complaints. They're usually fairly quick and inexpensive. However, if you have serious medical issues or chronic medical problems, these are probably not your best option.  No Primary Care Doctor: Call [facility] at  [phone] - they can help you locate a primary care doctor that  accepts your insurance, provides certain services,  etc. Physician Referral Service- [phone]  Chronic Pain Problems: Organization         Address  Phone   Notes  [facility]  [phone] Patients need to be referred by their primary care doctor.   Medication Assistance: Organization         Address  Phone   Notes  [facility] Medication [facility] [address] [place], [place] [address] [phone] --Must be a resident of [facility] -- Must have NO insurance coverage whatsoever (no Medicaid/ Medicare, etc.) -- The pt. MUST have a primary care doctor that directs their care regularly and follows them in the community   [facility]  [phone]   [employer]  [phone]    Agencies that provide inexpensive medical care: Organization         Address  Phone   Notes  [place]  [phone]   [facility]    [phone]   [facility] [place], [place] [address] [phone]   [place] [address] [place] [phone]   [facility]    [phone]   [facility]    [phone]   [place] and [place] [address], [place] Phone:  [phone], Fax:  [phone] Hours of Operation:  9 am - 6 pm, M-F.  Also accepts Medicaid/Medicare and self-pay.  [facility] for [place] [address], [address], [place] Phone: [phone], Fax: [phone]. Hours of Operation:  8:30 am - 5:30 pm, M-F.  Also accepts Medicaid and self-pay.  [facility] [address], [place] Phone: [phone]   [place], [place], [place] [phone], Ext. [phone] Mondays & Thursdays: 7-9 AM.  First 15 patients are seen on a first come, first serve basis.    [place] Providers:  Organization         Address  Phone   Notes  [facility] [address], [place] [phone] Also accepts self-pay patients.  [facility] [address], [place]  [phone]   [place], [address], [place] [phone]   [facility] [address] [place] [phone]   Lucianne Lei [address], [place]   [phone] Only accepts [place] patients after they have their name applied to their card.   Self-Pay (no insurance) in [facility]:  Organization         Address  Phone   Notes  Sickle Cell Patients, [facility] [place] [phone]   [facility] [place] [phone]   [facility] [place]  [address], [place] [phone]   [facility]/Dr. Osei-Bonsu  [address] [place] or [address], [place] [phone] Phone number for both [place] and [place] locations is the same.  [facility] [address] [place] [phone]   [facility] [address], [place] or [address] [phone]   [facility] [address] [place] [phone], phone; [phone], fax Sees patients 1st and 3rd Saturday of every month.  Must not qualify for public or private insurance (i.e. Medicaid, Medicare, [employer] Choice, Veterans' Benefits)  Household income should be no more than 200% of the poverty level The clinic cannot treat you if you are pregnant or think you are pregnant  Sexually transmitted diseases are not treated at the clinic.

## 2019-03-17 NOTE — ED Notes (Signed)
Pt ambulated down the hallway with steady gait, Denies dizziness and shortness of breath. Pt states she feels better than when coming into the hospital. Pt states she is ready to go home.

## 2019-03-17 NOTE — ED Triage Notes (Signed)
Ems reports pt woke up feeling nauseated and has vomited several times.  Reports feels like is going to pass out when she stands.  Denies diarrhea.  C/O lower back and abd pain.  Denies urinary symptoms.  LBM  Was today and was normal per pt.  Reports productive cough x 1 1/2 weeks.  Reports finished antibiotic for cough 2 days ago.

## 2019-03-17 NOTE — ED Triage Notes (Signed)
Ems says pt's  bp was 80 palpated after walking.  EMS gave 500cc bolus and bp increased to 108/60.

## 2019-03-20 DIAGNOSIS — K529 Noninfective gastroenteritis and colitis, unspecified: Secondary | ICD-10-CM | POA: Diagnosis not present

## 2019-04-28 ENCOUNTER — Other Ambulatory Visit: Payer: Self-pay | Admitting: Endocrinology

## 2019-05-07 ENCOUNTER — Telehealth: Payer: Self-pay | Admitting: Endocrinology

## 2019-05-07 NOTE — Telephone Encounter (Signed)
-----   Message from Elayne Snare, MD sent at 05/04/2019  9:24 PM EDT ----- Regarding: Needs appointment She has not been seen for diabetes since 10/19, needs follow-up with same-day labs otherwise will need to dismiss

## 2019-05-07 NOTE — Telephone Encounter (Signed)
Completed.

## 2019-05-23 ENCOUNTER — Other Ambulatory Visit: Payer: Medicare Other

## 2019-05-27 ENCOUNTER — Ambulatory Visit: Payer: Medicare Other | Admitting: Endocrinology

## 2019-06-02 DIAGNOSIS — F319 Bipolar disorder, unspecified: Secondary | ICD-10-CM | POA: Diagnosis not present

## 2019-06-02 DIAGNOSIS — E871 Hypo-osmolality and hyponatremia: Secondary | ICD-10-CM | POA: Diagnosis not present

## 2019-06-02 DIAGNOSIS — J449 Chronic obstructive pulmonary disease, unspecified: Secondary | ICD-10-CM | POA: Diagnosis not present

## 2019-06-02 DIAGNOSIS — G4730 Sleep apnea, unspecified: Secondary | ICD-10-CM | POA: Diagnosis not present

## 2019-06-02 DIAGNOSIS — Z794 Long term (current) use of insulin: Secondary | ICD-10-CM | POA: Diagnosis not present

## 2019-06-02 DIAGNOSIS — M797 Fibromyalgia: Secondary | ICD-10-CM | POA: Diagnosis not present

## 2019-06-02 DIAGNOSIS — E785 Hyperlipidemia, unspecified: Secondary | ICD-10-CM | POA: Diagnosis not present

## 2019-06-02 DIAGNOSIS — Z79899 Other long term (current) drug therapy: Secondary | ICD-10-CM | POA: Diagnosis not present

## 2019-06-02 DIAGNOSIS — E1165 Type 2 diabetes mellitus with hyperglycemia: Secondary | ICD-10-CM | POA: Diagnosis not present

## 2019-06-02 DIAGNOSIS — I10 Essential (primary) hypertension: Secondary | ICD-10-CM | POA: Diagnosis not present

## 2019-06-02 DIAGNOSIS — E039 Hypothyroidism, unspecified: Secondary | ICD-10-CM | POA: Diagnosis not present

## 2019-06-02 DIAGNOSIS — K219 Gastro-esophageal reflux disease without esophagitis: Secondary | ICD-10-CM | POA: Diagnosis not present

## 2019-06-02 DIAGNOSIS — F320 Major depressive disorder, single episode, mild: Secondary | ICD-10-CM | POA: Diagnosis not present

## 2019-06-03 DIAGNOSIS — E039 Hypothyroidism, unspecified: Secondary | ICD-10-CM | POA: Diagnosis not present

## 2019-06-03 DIAGNOSIS — F319 Bipolar disorder, unspecified: Secondary | ICD-10-CM | POA: Diagnosis not present

## 2019-06-03 DIAGNOSIS — E785 Hyperlipidemia, unspecified: Secondary | ICD-10-CM | POA: Diagnosis not present

## 2019-06-03 DIAGNOSIS — E871 Hypo-osmolality and hyponatremia: Secondary | ICD-10-CM | POA: Diagnosis not present

## 2019-06-03 DIAGNOSIS — E1165 Type 2 diabetes mellitus with hyperglycemia: Secondary | ICD-10-CM | POA: Diagnosis not present

## 2019-06-05 DIAGNOSIS — F319 Bipolar disorder, unspecified: Secondary | ICD-10-CM | POA: Diagnosis not present

## 2019-06-05 DIAGNOSIS — E039 Hypothyroidism, unspecified: Secondary | ICD-10-CM | POA: Diagnosis not present

## 2019-06-05 DIAGNOSIS — E1165 Type 2 diabetes mellitus with hyperglycemia: Secondary | ICD-10-CM | POA: Diagnosis not present

## 2019-06-05 DIAGNOSIS — E871 Hypo-osmolality and hyponatremia: Secondary | ICD-10-CM | POA: Diagnosis not present

## 2019-06-05 DIAGNOSIS — E785 Hyperlipidemia, unspecified: Secondary | ICD-10-CM | POA: Diagnosis not present

## 2019-06-10 DIAGNOSIS — J441 Chronic obstructive pulmonary disease with (acute) exacerbation: Secondary | ICD-10-CM | POA: Diagnosis not present

## 2019-06-10 DIAGNOSIS — E1165 Type 2 diabetes mellitus with hyperglycemia: Secondary | ICD-10-CM | POA: Diagnosis not present

## 2019-06-10 DIAGNOSIS — M545 Low back pain: Secondary | ICD-10-CM | POA: Diagnosis not present

## 2019-06-10 DIAGNOSIS — F330 Major depressive disorder, recurrent, mild: Secondary | ICD-10-CM | POA: Diagnosis not present

## 2019-07-03 DIAGNOSIS — I10 Essential (primary) hypertension: Secondary | ICD-10-CM | POA: Diagnosis not present

## 2019-07-03 DIAGNOSIS — J441 Chronic obstructive pulmonary disease with (acute) exacerbation: Secondary | ICD-10-CM | POA: Diagnosis not present

## 2019-07-03 DIAGNOSIS — F3341 Major depressive disorder, recurrent, in partial remission: Secondary | ICD-10-CM | POA: Diagnosis not present

## 2019-07-03 DIAGNOSIS — M545 Low back pain: Secondary | ICD-10-CM | POA: Diagnosis not present

## 2019-07-03 DIAGNOSIS — E1165 Type 2 diabetes mellitus with hyperglycemia: Secondary | ICD-10-CM | POA: Diagnosis not present

## 2019-07-03 DIAGNOSIS — J449 Chronic obstructive pulmonary disease, unspecified: Secondary | ICD-10-CM | POA: Diagnosis not present

## 2019-07-14 ENCOUNTER — Telehealth: Payer: Self-pay | Admitting: *Deleted

## 2019-07-14 NOTE — Telephone Encounter (Signed)
Patient states she has a really bad yeast infection and a blister on her vaginal area.  She is requesting Diflucan and Valtrex refill.

## 2019-07-15 ENCOUNTER — Ambulatory Visit (INDEPENDENT_AMBULATORY_CARE_PROVIDER_SITE_OTHER): Payer: Medicare Other | Admitting: Adult Health

## 2019-07-15 ENCOUNTER — Other Ambulatory Visit: Payer: Self-pay

## 2019-07-15 ENCOUNTER — Encounter: Payer: Self-pay | Admitting: Adult Health

## 2019-07-15 VITALS — BP 122/80 | HR 97 | Ht 63.0 in | Wt 196.0 lb

## 2019-07-15 DIAGNOSIS — B009 Herpesviral infection, unspecified: Secondary | ICD-10-CM | POA: Diagnosis not present

## 2019-07-15 DIAGNOSIS — B369 Superficial mycosis, unspecified: Secondary | ICD-10-CM | POA: Insufficient documentation

## 2019-07-15 MED ORDER — VALACYCLOVIR HCL 1 G PO TABS: 1000.0000 mg | ORAL_TABLET | Freq: Two times a day (BID) | ORAL | 3 refills | Status: DC

## 2019-07-15 MED ORDER — FLUCONAZOLE 150 MG PO TABS: ORAL_TABLET | ORAL | 3 refills | Status: DC

## 2019-07-15 MED ORDER — NYSTATIN 100000 UNIT/GM EX POWD: Freq: Four times a day (QID) | CUTANEOUS | 3 refills | Status: DC

## 2019-07-15 NOTE — Progress Notes (Signed)
Patient ID: Yesenia Wilson, female   DOB: [birth year], 63 y.o.   MRN: [id number] History of Present Illness: Yesenia Wilson is a 62 year old white female, single, sp hysterectomy in complaining of sore area in vagina for about a week, has often in same spot, usually when has yeast infection. PCP is Dr Sherrie Sport.   Current Medications, Allergies, Past Medical History, Past Surgical History, Family History and Social History were reviewed in [employer] record.     Review of Systems: Has sore painful area in vagina Lower stomach hurts at times too  Has redness under left breast Has DM    Physical Exam:BP 122/80 (BP Location: Left Arm, Patient Position: Sitting, Cuff Size: Normal)   Pulse 97   Ht 5\' 3"  (1.6 m)   Wt 196 lb (88.9 kg)   BMI 34.72 kg/m  General:  Well developed, well nourished, no acute distress Skin:  Warm and dry Breast: has skin fungus under left breast, red and irritated Pelvic:  External genitalia has vesicles, at base left labia(HSV culture obtained) and on right near clitoris.   The vagina is red and irritated . Urethra has no lesions or masses. The cervix and uterus are absent.  No adnexal masses or tenderness noted.Bladder is non tender, no masses felt. Psych:  No mood changes, alert and cooperative,seems happy Fall risk is high PHQ 9 score is 6, denies being suicidal and is on meds  Examination chaperoned by [employer] LPN Face time 15 minutes with 50% counseling regarding blood sugars, yeast and herpes.  Impression and Plan: 1. Herpes HSV culture sent Will rx valtrex  Follow up in 2 weeks   2. Superficial fungus infection of skin Will rx nystatin and diflucan  Meds ordered this encounter  Medications  . valACYclovir (VALTREX) 1000 MG tablet    Sig: Take 1 tablet (1,000 mg total) by mouth 2 (two) times daily.    Dispense:  20 tablet    Refill:  3    Order Specific Question:   Supervising Provider    Answer:   Elonda Husky, LUTHER H [[id number]]  .  nystatin (MYCOSTATIN/NYSTOP) powder    Sig: Apply topically 4 (four) times daily.    Dispense:  60 g    Refill:  3    Order Specific Question:   Supervising Provider    Answer:   EURE, LUTHER H [[id number]]  . fluconazole (DIFLUCAN) 150 MG tablet    Sig: Take 1 now and repeat in 3 days    Dispense:  2 tablet    Refill:  3    Order Specific Question:   Supervising Provider    Answer:   Tania Ade H [[id number]]

## 2019-07-18 LAB — HERPES SIMPLEX VIRUS CULTURE

## 2019-07-29 ENCOUNTER — Ambulatory Visit: Payer: Medicare Other | Admitting: Adult Health

## 2019-08-13 DIAGNOSIS — R112 Nausea with vomiting, unspecified: Secondary | ICD-10-CM | POA: Diagnosis not present

## 2019-08-13 DIAGNOSIS — R5381 Other malaise: Secondary | ICD-10-CM | POA: Diagnosis not present

## 2019-11-14 DIAGNOSIS — D123 Benign neoplasm of transverse colon: Secondary | ICD-10-CM | POA: Diagnosis not present

## 2019-11-14 DIAGNOSIS — R1319 Other dysphagia: Secondary | ICD-10-CM | POA: Diagnosis not present

## 2019-11-14 DIAGNOSIS — Z1211 Encounter for screening for malignant neoplasm of colon: Secondary | ICD-10-CM | POA: Diagnosis not present

## 2019-11-14 DIAGNOSIS — K449 Diaphragmatic hernia without obstruction or gangrene: Secondary | ICD-10-CM | POA: Diagnosis not present

## 2020-01-20 ENCOUNTER — Other Ambulatory Visit: Payer: Self-pay | Admitting: Endocrinology

## 2020-01-27 DIAGNOSIS — Z0000 Encounter for general adult medical examination without abnormal findings: Secondary | ICD-10-CM | POA: Diagnosis not present

## 2020-01-27 DIAGNOSIS — Z1331 Encounter for screening for depression: Secondary | ICD-10-CM | POA: Diagnosis not present

## 2020-01-27 DIAGNOSIS — M545 Low back pain: Secondary | ICD-10-CM | POA: Diagnosis not present

## 2020-01-27 DIAGNOSIS — F3341 Major depressive disorder, recurrent, in partial remission: Secondary | ICD-10-CM | POA: Diagnosis not present

## 2020-01-27 DIAGNOSIS — J449 Chronic obstructive pulmonary disease, unspecified: Secondary | ICD-10-CM | POA: Diagnosis not present

## 2020-01-27 DIAGNOSIS — E1165 Type 2 diabetes mellitus with hyperglycemia: Secondary | ICD-10-CM | POA: Diagnosis not present

## 2020-01-27 DIAGNOSIS — Z6834 Body mass index (BMI) 34.0-34.9, adult: Secondary | ICD-10-CM | POA: Diagnosis not present

## 2020-01-27 DIAGNOSIS — I10 Essential (primary) hypertension: Secondary | ICD-10-CM | POA: Diagnosis not present

## 2020-01-27 DIAGNOSIS — J441 Chronic obstructive pulmonary disease with (acute) exacerbation: Secondary | ICD-10-CM | POA: Diagnosis not present

## 2020-01-29 DIAGNOSIS — E1165 Type 2 diabetes mellitus with hyperglycemia: Secondary | ICD-10-CM | POA: Diagnosis not present

## 2020-01-29 DIAGNOSIS — R531 Weakness: Secondary | ICD-10-CM | POA: Diagnosis not present

## 2020-01-29 DIAGNOSIS — G4489 Other headache syndrome: Secondary | ICD-10-CM | POA: Diagnosis not present

## 2020-01-29 DIAGNOSIS — R457 State of emotional shock and stress, unspecified: Secondary | ICD-10-CM | POA: Diagnosis not present

## 2020-01-30 DIAGNOSIS — I10 Essential (primary) hypertension: Secondary | ICD-10-CM | POA: Diagnosis not present

## 2020-01-30 DIAGNOSIS — J929 Pleural plaque without asbestos: Secondary | ICD-10-CM | POA: Diagnosis not present

## 2020-01-30 DIAGNOSIS — A419 Sepsis, unspecified organism: Secondary | ICD-10-CM | POA: Diagnosis not present

## 2020-01-30 DIAGNOSIS — E039 Hypothyroidism, unspecified: Secondary | ICD-10-CM | POA: Diagnosis not present

## 2020-01-30 DIAGNOSIS — E119 Type 2 diabetes mellitus without complications: Secondary | ICD-10-CM | POA: Diagnosis not present

## 2020-01-30 DIAGNOSIS — Z885 Allergy status to narcotic agent status: Secondary | ICD-10-CM | POA: Diagnosis not present

## 2020-01-30 DIAGNOSIS — E1165 Type 2 diabetes mellitus with hyperglycemia: Secondary | ICD-10-CM | POA: Diagnosis not present

## 2020-01-30 DIAGNOSIS — Z7984 Long term (current) use of oral hypoglycemic drugs: Secondary | ICD-10-CM | POA: Diagnosis not present

## 2020-01-30 DIAGNOSIS — R1084 Generalized abdominal pain: Secondary | ICD-10-CM | POA: Diagnosis not present

## 2020-01-30 DIAGNOSIS — K5730 Diverticulosis of large intestine without perforation or abscess without bleeding: Secondary | ICD-10-CM | POA: Diagnosis not present

## 2020-01-30 DIAGNOSIS — R0902 Hypoxemia: Secondary | ICD-10-CM | POA: Diagnosis not present

## 2020-01-30 DIAGNOSIS — E860 Dehydration: Secondary | ICD-10-CM | POA: Diagnosis not present

## 2020-01-30 DIAGNOSIS — R29810 Facial weakness: Secondary | ICD-10-CM | POA: Diagnosis not present

## 2020-01-30 DIAGNOSIS — R52 Pain, unspecified: Secondary | ICD-10-CM | POA: Diagnosis not present

## 2020-01-30 DIAGNOSIS — M329 Systemic lupus erythematosus, unspecified: Secondary | ICD-10-CM | POA: Diagnosis not present

## 2020-01-30 DIAGNOSIS — K449 Diaphragmatic hernia without obstruction or gangrene: Secondary | ICD-10-CM | POA: Diagnosis not present

## 2020-01-30 DIAGNOSIS — Z79899 Other long term (current) drug therapy: Secondary | ICD-10-CM | POA: Diagnosis not present

## 2020-01-31 DIAGNOSIS — R42 Dizziness and giddiness: Secondary | ICD-10-CM | POA: Diagnosis not present

## 2020-01-31 DIAGNOSIS — E7800 Pure hypercholesterolemia, unspecified: Secondary | ICD-10-CM | POA: Diagnosis not present

## 2020-01-31 DIAGNOSIS — Z794 Long term (current) use of insulin: Secondary | ICD-10-CM | POA: Diagnosis not present

## 2020-01-31 DIAGNOSIS — E119 Type 2 diabetes mellitus without complications: Secondary | ICD-10-CM | POA: Diagnosis not present

## 2020-01-31 DIAGNOSIS — R1030 Lower abdominal pain, unspecified: Secondary | ICD-10-CM | POA: Diagnosis not present

## 2020-01-31 DIAGNOSIS — J449 Chronic obstructive pulmonary disease, unspecified: Secondary | ICD-10-CM | POA: Diagnosis not present

## 2020-01-31 DIAGNOSIS — R112 Nausea with vomiting, unspecified: Secondary | ICD-10-CM | POA: Diagnosis not present

## 2020-01-31 DIAGNOSIS — R410 Disorientation, unspecified: Secondary | ICD-10-CM | POA: Diagnosis not present

## 2020-01-31 DIAGNOSIS — K449 Diaphragmatic hernia without obstruction or gangrene: Secondary | ICD-10-CM | POA: Diagnosis not present

## 2020-01-31 DIAGNOSIS — F329 Major depressive disorder, single episode, unspecified: Secondary | ICD-10-CM | POA: Diagnosis not present

## 2020-01-31 DIAGNOSIS — R0689 Other abnormalities of breathing: Secondary | ICD-10-CM | POA: Diagnosis not present

## 2020-01-31 DIAGNOSIS — R1084 Generalized abdominal pain: Secondary | ICD-10-CM | POA: Diagnosis not present

## 2020-01-31 DIAGNOSIS — E1165 Type 2 diabetes mellitus with hyperglycemia: Secondary | ICD-10-CM | POA: Diagnosis not present

## 2020-01-31 DIAGNOSIS — N3000 Acute cystitis without hematuria: Secondary | ICD-10-CM | POA: Diagnosis not present

## 2020-01-31 DIAGNOSIS — Z9641 Presence of insulin pump (external) (internal): Secondary | ICD-10-CM | POA: Diagnosis not present

## 2020-02-10 DIAGNOSIS — I952 Hypotension due to drugs: Secondary | ICD-10-CM | POA: Diagnosis not present

## 2020-02-10 DIAGNOSIS — Z6833 Body mass index (BMI) 33.0-33.9, adult: Secondary | ICD-10-CM | POA: Diagnosis not present

## 2020-02-10 DIAGNOSIS — N3091 Cystitis, unspecified with hematuria: Secondary | ICD-10-CM | POA: Diagnosis not present

## 2020-02-20 DIAGNOSIS — N390 Urinary tract infection, site not specified: Secondary | ICD-10-CM | POA: Diagnosis not present

## 2020-03-20 DIAGNOSIS — Z79899 Other long term (current) drug therapy: Secondary | ICD-10-CM | POA: Diagnosis not present

## 2020-03-20 DIAGNOSIS — N3000 Acute cystitis without hematuria: Secondary | ICD-10-CM | POA: Diagnosis not present

## 2020-03-20 DIAGNOSIS — Z794 Long term (current) use of insulin: Secondary | ICD-10-CM | POA: Diagnosis not present

## 2020-03-20 DIAGNOSIS — R3915 Urgency of urination: Secondary | ICD-10-CM | POA: Diagnosis not present

## 2020-03-20 DIAGNOSIS — Z9049 Acquired absence of other specified parts of digestive tract: Secondary | ICD-10-CM | POA: Diagnosis not present

## 2020-03-20 DIAGNOSIS — M545 Low back pain: Secondary | ICD-10-CM | POA: Diagnosis not present

## 2020-03-20 DIAGNOSIS — R300 Dysuria: Secondary | ICD-10-CM | POA: Diagnosis not present

## 2020-03-20 DIAGNOSIS — I700 Atherosclerosis of aorta: Secondary | ICD-10-CM | POA: Diagnosis not present

## 2020-03-20 DIAGNOSIS — B3749 Other urogenital candidiasis: Secondary | ICD-10-CM | POA: Diagnosis not present

## 2020-03-20 DIAGNOSIS — K5730 Diverticulosis of large intestine without perforation or abscess without bleeding: Secondary | ICD-10-CM | POA: Diagnosis not present

## 2020-03-20 DIAGNOSIS — R1032 Left lower quadrant pain: Secondary | ICD-10-CM | POA: Diagnosis not present

## 2020-03-20 DIAGNOSIS — K449 Diaphragmatic hernia without obstruction or gangrene: Secondary | ICD-10-CM | POA: Diagnosis not present

## 2020-03-20 DIAGNOSIS — R350 Frequency of micturition: Secondary | ICD-10-CM | POA: Diagnosis not present

## 2020-03-20 DIAGNOSIS — E119 Type 2 diabetes mellitus without complications: Secondary | ICD-10-CM | POA: Diagnosis not present

## 2020-03-23 DIAGNOSIS — Z6832 Body mass index (BMI) 32.0-32.9, adult: Secondary | ICD-10-CM | POA: Diagnosis not present

## 2020-03-23 DIAGNOSIS — M545 Low back pain: Secondary | ICD-10-CM | POA: Diagnosis not present

## 2020-03-23 DIAGNOSIS — N3091 Cystitis, unspecified with hematuria: Secondary | ICD-10-CM | POA: Diagnosis not present

## 2020-03-31 ENCOUNTER — Emergency Department (HOSPITAL_COMMUNITY): Payer: Medicare HMO

## 2020-03-31 ENCOUNTER — Other Ambulatory Visit: Payer: Self-pay

## 2020-03-31 ENCOUNTER — Emergency Department (HOSPITAL_COMMUNITY)
Admission: EM | Admit: 2020-03-31 | Discharge: 2020-04-01 | Disposition: A | Discharge status: Home / Self Care | Payer: Medicare HMO | Attending: Emergency Medicine | Admitting: Emergency Medicine

## 2020-03-31 DIAGNOSIS — Z794 Long term (current) use of insulin: Secondary | ICD-10-CM | POA: Diagnosis not present

## 2020-03-31 DIAGNOSIS — R410 Disorientation, unspecified: Secondary | ICD-10-CM | POA: Insufficient documentation

## 2020-03-31 DIAGNOSIS — Z79899 Other long term (current) drug therapy: Secondary | ICD-10-CM | POA: Insufficient documentation

## 2020-03-31 DIAGNOSIS — S0990XA Unspecified injury of head, initial encounter: Secondary | ICD-10-CM | POA: Diagnosis not present

## 2020-03-31 DIAGNOSIS — E039 Hypothyroidism, unspecified: Secondary | ICD-10-CM | POA: Diagnosis not present

## 2020-03-31 DIAGNOSIS — T7421XA Adult sexual abuse, confirmed, initial encounter: Secondary | ICD-10-CM | POA: Diagnosis not present

## 2020-03-31 DIAGNOSIS — I10 Essential (primary) hypertension: Secondary | ICD-10-CM | POA: Diagnosis not present

## 2020-03-31 DIAGNOSIS — T6593XA Toxic effect of unspecified substance, assault, initial encounter: Secondary | ICD-10-CM | POA: Diagnosis not present

## 2020-03-31 DIAGNOSIS — E1165 Type 2 diabetes mellitus with hyperglycemia: Secondary | ICD-10-CM | POA: Diagnosis not present

## 2020-03-31 DIAGNOSIS — R000 Tachycardia, unspecified: Secondary | ICD-10-CM | POA: Diagnosis not present

## 2020-03-31 DIAGNOSIS — R42 Dizziness and giddiness: Secondary | ICD-10-CM | POA: Diagnosis not present

## 2020-03-31 DIAGNOSIS — E119 Type 2 diabetes mellitus without complications: Secondary | ICD-10-CM | POA: Diagnosis not present

## 2020-03-31 DIAGNOSIS — Z7982 Long term (current) use of aspirin: Secondary | ICD-10-CM | POA: Diagnosis not present

## 2020-03-31 DIAGNOSIS — J449 Chronic obstructive pulmonary disease, unspecified: Secondary | ICD-10-CM | POA: Insufficient documentation

## 2020-03-31 DIAGNOSIS — Z0389 Encounter for observation for other suspected diseases and conditions ruled out: Secondary | ICD-10-CM | POA: Diagnosis not present

## 2020-03-31 DIAGNOSIS — T452X1A Poisoning by vitamins, accidental (unintentional), initial encounter: Secondary | ICD-10-CM | POA: Diagnosis not present

## 2020-03-31 DIAGNOSIS — I6381 Other cerebral infarction due to occlusion or stenosis of small artery: Secondary | ICD-10-CM | POA: Diagnosis not present

## 2020-03-31 LAB — CBC WITH DIFFERENTIAL/PLATELET
Abs Immature Granulocytes: 0.04 10*3/uL (ref 0.00–0.07)
Basophils Absolute: 0.1 10*3/uL (ref 0.0–0.1)
Basophils Relative: 1 %
Eosinophils Absolute: 0 10*3/uL (ref 0.0–0.5)
Eosinophils Relative: 0 %
HCT: 48.3 % — ABNORMAL HIGH (ref 36.0–46.0)
Hemoglobin: 16.1 g/dL — ABNORMAL HIGH (ref 12.0–15.0)
Immature Granulocytes: 0 %
Lymphocytes Relative: 12 %
Lymphs Abs: 1.8 10*3/uL (ref 0.7–4.0)
MCH: 29.9 pg (ref 26.0–34.0)
MCHC: 33.3 g/dL (ref 30.0–36.0)
MCV: 89.8 fL (ref 80.0–100.0)
Monocytes Absolute: 0.8 10*3/uL (ref 0.1–1.0)
Monocytes Relative: 5 %
Neutro Abs: 12 10*3/uL — ABNORMAL HIGH (ref 1.7–7.7)
Neutrophils Relative %: 82 %
Platelets: 355 10*3/uL (ref 150–400)
RBC: 5.38 MIL/uL — ABNORMAL HIGH (ref 3.87–5.11)
RDW: 13.2 % (ref 11.5–15.5)
WBC: 14.7 10*3/uL — ABNORMAL HIGH (ref 4.0–10.5)
nRBC: 0 % (ref 0.0–0.2)

## 2020-03-31 LAB — COMPREHENSIVE METABOLIC PANEL
ALT: 22 U/L (ref 0–44)
AST: 24 U/L (ref 15–41)
Albumin: 4.2 g/dL (ref 3.5–5.0)
Alkaline Phosphatase: 101 U/L (ref 38–126)
Anion gap: 14 (ref 5–15)
BUN: 18 mg/dL (ref 8–23)
CO2: 21 mmol/L — ABNORMAL LOW (ref 22–32)
Calcium: 9.5 mg/dL (ref 8.9–10.3)
Chloride: 101 mmol/L (ref 98–111)
Creatinine, Ser: 0.99 mg/dL (ref 0.44–1.00)
GFR calc Af Amer: >60 mL/min (ref >60)
GFR calc non Af Amer: 60 mL/min — ABNORMAL LOW (ref >60)
Glucose, Bld: 272 mg/dL — ABNORMAL HIGH (ref 70–99)
Potassium: 4.1 mmol/L (ref 3.5–5.1)
Sodium: 136 mmol/L (ref 135–145)
Total Bilirubin: 1 mg/dL (ref 0.3–1.2)
Total Protein: 8 g/dL (ref 6.5–8.1)

## 2020-03-31 LAB — CBG MONITORING, ED: Glucose-Capillary: 253 mg/dL — ABNORMAL HIGH (ref 70–99)

## 2020-03-31 LAB — RAPID URINE DRUG SCREEN, HOSP PERFORMED
Amphetamines: NOT DETECTED
Barbiturates: NOT DETECTED
Benzodiazepines: NOT DETECTED
Cocaine: NOT DETECTED
Opiates: NOT DETECTED
Tetrahydrocannabinol: POSITIVE — AB

## 2020-03-31 LAB — ACETAMINOPHEN LEVEL: Acetaminophen (Tylenol), Serum: <10 ug/mL — ABNORMAL LOW (ref 10–30)

## 2020-03-31 LAB — SALICYLATE LEVEL: Salicylate Lvl: <7 mg/dL — ABNORMAL LOW (ref 7.0–30.0)

## 2020-03-31 LAB — ETHANOL: Alcohol, Ethyl (B): <10 mg/dL (ref <10)

## 2020-03-31 MED ORDER — SODIUM CHLORIDE 0.9 % IV BOLUS
1000.0000 mL | Freq: Once | INTRAVENOUS | Status: AC
  Administered 2020-03-31: 1000 mL via INTRAVENOUS

## 2020-03-31 MED ORDER — LORAZEPAM 2 MG/ML IJ SOLN
0.5000 mg | Freq: Once | INTRAMUSCULAR | Status: AC
  Administered 2020-03-31: 0.5 mg via INTRAVENOUS
  Filled 2020-03-31: qty 1
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
## 2020-04-01 MED ORDER — ONDANSETRON 4 MG PO TBDP
4.0000 mg | ORAL_TABLET | Freq: Once | ORAL | Status: DC
  Filled 2020-04-01: qty 1

## 2020-04-01 MED ORDER — METRONIDAZOLE 500 MG PO TABS
2000.0000 mg | ORAL_TABLET | Freq: Once | ORAL | Status: AC
  Administered 2020-04-01: 2000 mg via ORAL
  Filled 2020-04-01: qty 4

## 2020-04-01 MED ORDER — CEFTRIAXONE SODIUM 1 G IJ SOLR
500.0000 mg | Freq: Once | INTRAMUSCULAR | Status: AC
  Administered 2020-04-01: 500 mg via INTRAMUSCULAR
  Filled 2020-04-01: qty 10

## 2020-04-01 MED ORDER — AZITHROMYCIN 250 MG PO TABS
1000.0000 mg | ORAL_TABLET | Freq: Once | ORAL | Status: AC
  Administered 2020-04-01: 1000 mg via ORAL
  Filled 2020-04-01: qty 4

## 2020-04-01 MED ORDER — LIDOCAINE HCL (PF) 1 % IJ SOLN
1.0000 mL | Freq: Once | INTRAMUSCULAR | Status: AC
  Administered 2020-04-01: 1 mL
  Filled 2020-04-01: qty 30

## 2020-04-01 NOTE — SANE Note (Signed)
At approximately 2345, FNE attempted to speak with patient again.  Yesenia Wilson was still unable to answer questions appropriately and seemed unable to concentrate.  FNE spoke with new attending PA, Carol Ada and explained that patient was still unable to sign consent.  Yesenia Wilson stated that patient would remain in the ED overnight and she would see if patient was better able to answer questions in the morning.
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
## 2020-04-01 NOTE — ED Notes (Signed)
Off Duty officer at bedside speaking with patient.

## 2020-04-01 NOTE — ED Notes (Signed)
Pt is sleeping soundly.

## 2020-04-01 NOTE — ED Notes (Signed)
Patient refused the sane kit again. Pt states she will come back to get the kit done.

## 2020-04-01 NOTE — ED Notes (Addendum)
Contacted SANE at approximately 10:15pm (2215p).

## 2020-04-01 NOTE — ED Notes (Signed)
Patient moved to room 12 for sane examination.

## 2020-04-01 NOTE — ED Notes (Signed)
Patient given meal tray for PO challenge.

## 2020-04-01 NOTE — ED Notes (Signed)
Pt walked to the restroom and back w/o assistance.  Pt does not want to be seen by SANE RN at this time.  Educated that she has 5 days, if she changes her mind.
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
## 2020-04-08 DIAGNOSIS — Z0000 Encounter for general adult medical examination without abnormal findings: Secondary | ICD-10-CM | POA: Diagnosis not present

## 2020-04-08 DIAGNOSIS — Z6831 Body mass index (BMI) 31.0-31.9, adult: Secondary | ICD-10-CM | POA: Diagnosis not present

## 2020-05-12 ENCOUNTER — Ambulatory Visit (INDEPENDENT_AMBULATORY_CARE_PROVIDER_SITE_OTHER): Payer: Medicare HMO | Admitting: Otolaryngology

## 2020-05-28 ENCOUNTER — Other Ambulatory Visit: Payer: Self-pay

## 2020-05-28 ENCOUNTER — Ambulatory Visit (INDEPENDENT_AMBULATORY_CARE_PROVIDER_SITE_OTHER): Payer: Medicare HMO | Admitting: Otolaryngology

## 2020-05-28 VITALS — Temp 96.8°F

## 2020-05-28 DIAGNOSIS — H608X3 Other otitis externa, bilateral: Secondary | ICD-10-CM

## 2020-05-28 NOTE — Progress Notes (Signed)
HPI: Yesenia Wilson is a 64 y.o. female who returns today for evaluation of ear problems with chronic itching in her ears.  She wanted her ears checked.  She is not having any hearing problems. She also inquires about snoring as she has had previous UPPP but still snores.  She apparently has had a sleep test in the past and was never diagnosed with OSA.Marland Kitchen  Past Medical History:  Diagnosis Date  . Anxiety   . Asthma   . Clotting disorder ([place])    DVT left leg between ankle and knee due to tennis injury  . COPD (chronic obstructive pulmonary disease) ([place])   . Depression   . Diabetes mellitus   . DVT (deep venous thrombosis) (H[facility])    left leg  . Fibromyalgia   . GERD (gastroesophageal reflux disease)   . Heart murmur   . Hiatal hernia   . Hyperlipidemia   . Hypertension   . Hypothyroidism   . Lupus ([place])   . Neuromuscular disorder ([facility])    fibromyalgia  . Pancreatitis   . Pneumonia    Past Surgical History:  Procedure Laterality Date  . ABDOMINAL HYSTERECTOMY    . APPENDECTOMY    . bladder tack    . CESAREAN SECTION    . CHOLECYSTECTOMY    . COLONOSCOPY     12 yrs ago  . LEFT HEART CATH AND CORONARY ANGIOGRAPHY N/A 11/22/2016   Procedure: Left Heart Cath and Coronary Angiography;  Surgeon: Burnell Blanks, MD;  Location: [facility];  Service: Cardiovascular;  Laterality: N/A;  . SEPTOPLASTY     Social History   Socioeconomic History  . Marital status: Single    Spouse name: Not on file  . Number of children: 2  . Years of education: Not on file  . Highest education level: Not on file  Occupational History  . Not on file  Tobacco Use  . Smoking status: Never Smoker  . Smokeless tobacco: Never Used  Vaping Use  . Vaping Use: Never used  Substance and Sexual Activity  . Alcohol use: Yes    Comment: Once every 2-3 months   . Drug use: No  . Sexual activity: Not Currently    Birth control/protection: Surgical    Comment: hyst  Other Topics Concern   . Not on file  Social History Narrative  . Not on file   Social Determinants of Health   Financial Resource Strain:   . Difficulty of Paying Living Expenses: Not on file  Food Insecurity:   . Worried About Charity fundraiser in the Last Year: Not on file  . Ran Out of Food in the Last Year: Not on file  Transportation Needs:   . Lack of Transportation (Medical): Not on file  . Lack of Transportation (Non-Medical): Not on file  Physical Activity:   . Days of Exercise per Week: Not on file  . Minutes of Exercise per Session: Not on file  Stress:   . Feeling of Stress : Not on file  Social Connections:   . Frequency of Communication with Friends and Family: Not on file  . Frequency of Social Gatherings with Friends and Family: Not on file  . Attends Religious Services: Not on file  . Active Member of Clubs or Organizations: Not on file  . Attends Archivist Meetings: Not on file  . Marital Status: Not on file   Family History  Adopted: Yes  Problem Relation Age of Onset  .  Diabetes Mellitus II Mother   . Colon polyps Mother   . Peptic Ulcer Mother   . CAD Father   . Diabetes Mellitus II Father   . CAD Sister   . Diabetes Mellitus II Sister   . CAD Brother   . Diabetes Mellitus II Brother   . Breast cancer Maternal Grandmother   . Stomach cancer Maternal Grandmother   . Colon cancer Maternal Grandmother   . Cirrhosis Cousin        x 2  . Esophageal cancer Neg Hx   . Rectal cancer Neg Hx    Allergies  Allergen Reactions  . Other Other (See Comments)    Packing used during nose surgery : Shock  . Oxycodone-Acetaminophen     REACTION: GI upset  . Propoxyphene N-Acetaminophen     REACTION: nausea, rapid heartrate   Prior to Admission medications   Medication Sig Start Date End Date Taking? Authorizing Provider  BD PEN NEEDLE NANO U/F 32G X 4 MM MISC USE 3 PER TO DAY TO INJECT NOVOLOG 01/18/17  Yes [provider]  DULoxetine (CYMBALTA) 60 MG  capsule TAKE 1 CAPSULE (60 MG TOTAL) BY MOUTH DAILY. 12/17/17  Yes Burchette, Alinda Sierras, MD  fluconazole (DIFLUCAN) 150 MG tablet Take 1 now and repeat in 3 days 07/15/19  Yes Derrek Monaco A, NP  Insulin Degludec (TRESIBA) 100 UNIT/ML SOLN Inject 40 Units into the skin at bedtime.    Yes [provider]  Insulin Pen Needle (BD PEN NEEDLE NANO U/F) 32G X 4 MM MISC Use as directed to inject insulin 4 times daily. 06/18/18  Yes Elayne Snare, MD  insulin regular human CONCENTRATED (HUMULIN R) 500 UNIT/ML injection Fill insulin pump with 2 mL every 3 days Patient taking differently: Inject 50 Units into the skin in the morning and at bedtime.  08/06/18  Yes Elayne Snare, MD  JARDIANCE 10 MG TABS tablet Take 10 mg by mouth daily. 06/05/19  Yes [provider]  levothyroxine (SYNTHROID, LEVOTHROID) 200 MCG tablet Take 200 mcg by mouth daily. 07/17/18  Yes [provider]  Multiple Vitamins-Minerals (MULTIVITAMIN WOMEN 50+ PO) Take 1 tablet by mouth daily.   Yes [provider]  nystatin (MYCOSTATIN/NYSTOP) powder Apply topically 4 (four) times daily. 07/15/19  Yes Estill Dooms, NP  ONETOUCH DELICA LANCETS 88C MISC USE TO CHECK BLOOD SUGAR 3 TIMES PER DAY. DX CODE E11.9 04/26/18  Yes Elayne Snare, MD  [facility] VERIO test strip USE TO TEST BLOOD SUGAR 3 TIMES A DAY DX CODE E11.9 02/24/19  Yes Elayne Snare, MD  pantoprazole (PROTONIX) 40 MG tablet TAKE 1 TABLET BY MOUTH TWICE A DAY Patient taking differently: Take 40 mg by mouth 2 (two) times daily.  03/25/18  Yes Ladene Artist, MD  Selenium 200 MCG CAPS Take 200 mcg by mouth daily.   Yes [provider]  valACYclovir (VALTREX) 1000 MG tablet Take 1 tablet (1,000 mg total) by mouth 2 (two) times daily. 07/15/19  Yes Derrek Monaco A, NP  amLODipine (NORVASC) 5 MG tablet Take 1 tablet (5 mg total) by mouth daily. Patient not taking: Reported on 03/31/2020 07/09/18   Dorothyann Peng, NP  ARIPiprazole (ABILIFY) 2 MG  tablet TAKE 1 TABLET (2 MG TOTAL) BY MOUTH AT BEDTIME. Patient not taking: Reported on 03/31/2020 01/14/18   Eulas Post, MD  aspirin 81 MG tablet Take 81 mg by mouth daily.      [provider]  chlorthalidone (HYGROTON) 25 MG  tablet Take 25 mg by mouth daily. Patient not taking: Reported on 03/31/2020 02/16/19   [provider]  ibuprofen (ADVIL,MOTRIN) 800 MG tablet Take 1 tablet (800 mg total) by mouth every 8 (eight) hours as needed for up to 30 doses. Patient not taking: Reported on 03/31/2020 07/09/18   Dorothyann Peng, NP  irbesartan-hydrochlorothiazide (AVALIDE) 300-12.5 MG tablet Take 1 tablet by mouth daily. 07/09/18 03/17/19  Nafziger, Tommi Rumps, NP  metFORMIN (GLUCOPHAGE) 1000 MG tablet TAKE 1 TABLET BY MOUTH TWICE A DAY Patient not taking: Reported on 03/31/2020 03/25/18   Elayne Snare, MD  metoprolol succinate (TOPROL-XL) 25 MG 24 hr tablet Take 25 mg by mouth daily. Patient not taking: Reported on 03/31/2020 07/22/18   [provider]  rosuvastatin (CRESTOR) 20 MG tablet TAKE 1 TABLET BY MOUTH EVERY DAY Patient not taking: Reported on 03/31/2020 08/16/18   Elayne Snare, MD     Positive ROS: Otherwise negative  All other systems have been reviewed and were otherwise negative with the exception of those mentioned in the HPI and as above.  Physical Exam: Constitutional: Alert, well-appearing, no acute distress Ears: External ears without lesions or tenderness.  On microscopic exam of the ears she has some dryness and flaking of the skin within the ear canal that was cleaned.  No evidence of external otitis.  TMs are clear bilaterally with good mobility on pneumatic otoscopy Nasal: External nose without lesions. Septum midline with clear nasal passages bilaterally.  Able to visualize the nasopharynx without difficulty. Oral: Lips and gums without lesions. Tongue and palate mucosa without lesions. Posterior oropharynx clear.  Patient is status post UPPP. Neck: No  palpable adenopathy or masses Respiratory: Breathing comfortably  Skin: No facial/neck lesions or rash noted.  Binocular microscopy  Date/Time: 05/28/2020 1:42 PM Performed by: Rozetta Nunnery, MD Authorized by: Rozetta Nunnery, MD   Consent:    Consent obtained:  Verbal   Consent given by:  Patient   Alternatives discussed:  No treatment Procedure details:    Indications: otitis externa     Scope location: bilateral     Right speculum size:  4 Cerumen:    Right ear: normal quantity of cerumen     Left ear: normal quantity of cerumen   Right ear canal:    normal     scaly   Right tympanic membrane:    Mobility: fully mobile   Left ear canal:    normal     scaly   Left tympanic membrane:    Mobility: fully mobile   Post-procedure details:    Patient tolerance of procedure:  Tolerated well, no immediate complications Comments:     On microscopic exam of the ears she had some scaling and dryness within the ear canal but no evidence of infection.    Assessment: Chronic itching in both ears  Plan: Prescribed use of Diprolene 0.05% cream or lotion on the outer portion and prescribed fluocinolone oil 0.01% to use if the itching is deep within the ear canal twice daily x3 to 4 days. She will follow-up as needed   Radene Journey, MD

## 2020-06-03 ENCOUNTER — Other Ambulatory Visit: Payer: Self-pay | Admitting: Adult Health

## 2020-06-21 DIAGNOSIS — R42 Dizziness and giddiness: Secondary | ICD-10-CM | POA: Diagnosis not present

## 2020-06-21 DIAGNOSIS — F329 Major depressive disorder, single episode, unspecified: Secondary | ICD-10-CM | POA: Diagnosis not present

## 2020-06-21 DIAGNOSIS — D1771 Benign lipomatous neoplasm of kidney: Secondary | ICD-10-CM | POA: Diagnosis not present

## 2020-06-21 DIAGNOSIS — R079 Chest pain, unspecified: Secondary | ICD-10-CM | POA: Diagnosis not present

## 2020-06-21 DIAGNOSIS — K219 Gastro-esophageal reflux disease without esophagitis: Secondary | ICD-10-CM | POA: Diagnosis not present

## 2020-06-21 DIAGNOSIS — K5730 Diverticulosis of large intestine without perforation or abscess without bleeding: Secondary | ICD-10-CM | POA: Diagnosis not present

## 2020-06-21 DIAGNOSIS — K449 Diaphragmatic hernia without obstruction or gangrene: Secondary | ICD-10-CM | POA: Diagnosis not present

## 2020-06-21 DIAGNOSIS — J90 Pleural effusion, not elsewhere classified: Secondary | ICD-10-CM | POA: Diagnosis not present

## 2020-06-21 DIAGNOSIS — E119 Type 2 diabetes mellitus without complications: Secondary | ICD-10-CM | POA: Diagnosis not present

## 2020-06-21 DIAGNOSIS — R519 Headache, unspecified: Secondary | ICD-10-CM | POA: Diagnosis not present

## 2020-06-21 DIAGNOSIS — E039 Hypothyroidism, unspecified: Secondary | ICD-10-CM | POA: Diagnosis not present

## 2020-06-21 DIAGNOSIS — J449 Chronic obstructive pulmonary disease, unspecified: Secondary | ICD-10-CM | POA: Diagnosis not present

## 2020-06-21 DIAGNOSIS — F419 Anxiety disorder, unspecified: Secondary | ICD-10-CM | POA: Diagnosis not present

## 2020-07-04 DIAGNOSIS — M329 Systemic lupus erythematosus, unspecified: Secondary | ICD-10-CM | POA: Diagnosis not present

## 2020-07-04 DIAGNOSIS — Z9049 Acquired absence of other specified parts of digestive tract: Secondary | ICD-10-CM | POA: Diagnosis not present

## 2020-07-04 DIAGNOSIS — E079 Disorder of thyroid, unspecified: Secondary | ICD-10-CM | POA: Diagnosis not present

## 2020-07-04 DIAGNOSIS — I10 Essential (primary) hypertension: Secondary | ICD-10-CM | POA: Diagnosis not present

## 2020-07-04 DIAGNOSIS — R1013 Epigastric pain: Secondary | ICD-10-CM | POA: Diagnosis not present

## 2020-07-04 DIAGNOSIS — D849 Immunodeficiency, unspecified: Secondary | ICD-10-CM | POA: Diagnosis not present

## 2020-07-04 DIAGNOSIS — K2100 Gastro-esophageal reflux disease with esophagitis, without bleeding: Secondary | ICD-10-CM | POA: Diagnosis not present

## 2020-07-04 DIAGNOSIS — E119 Type 2 diabetes mellitus without complications: Secondary | ICD-10-CM | POA: Diagnosis not present

## 2020-07-04 DIAGNOSIS — R519 Headache, unspecified: Secondary | ICD-10-CM | POA: Diagnosis not present

## 2020-07-05 DIAGNOSIS — R1013 Epigastric pain: Secondary | ICD-10-CM | POA: Diagnosis not present

## 2020-07-06 DIAGNOSIS — Z6831 Body mass index (BMI) 31.0-31.9, adult: Secondary | ICD-10-CM | POA: Diagnosis not present

## 2020-07-06 DIAGNOSIS — R457 State of emotional shock and stress, unspecified: Secondary | ICD-10-CM | POA: Diagnosis not present

## 2020-07-06 DIAGNOSIS — R069 Unspecified abnormalities of breathing: Secondary | ICD-10-CM | POA: Diagnosis not present

## 2020-07-06 DIAGNOSIS — I151 Hypertension secondary to other renal disorders: Secondary | ICD-10-CM | POA: Diagnosis not present

## 2020-07-06 DIAGNOSIS — I10 Essential (primary) hypertension: Secondary | ICD-10-CM | POA: Diagnosis not present

## 2020-07-06 DIAGNOSIS — R0602 Shortness of breath: Secondary | ICD-10-CM | POA: Diagnosis not present

## 2020-07-06 DIAGNOSIS — Z0000 Encounter for general adult medical examination without abnormal findings: Secondary | ICD-10-CM | POA: Diagnosis not present

## 2020-07-06 DIAGNOSIS — E1165 Type 2 diabetes mellitus with hyperglycemia: Secondary | ICD-10-CM | POA: Diagnosis not present

## 2020-07-08 DIAGNOSIS — K449 Diaphragmatic hernia without obstruction or gangrene: Secondary | ICD-10-CM | POA: Diagnosis not present

## 2020-07-08 DIAGNOSIS — R4702 Dysphasia: Secondary | ICD-10-CM | POA: Diagnosis not present

## 2020-07-15 DIAGNOSIS — R4702 Dysphasia: Secondary | ICD-10-CM | POA: Diagnosis not present

## 2020-07-15 DIAGNOSIS — K449 Diaphragmatic hernia without obstruction or gangrene: Secondary | ICD-10-CM | POA: Diagnosis not present

## 2020-08-11 DIAGNOSIS — Z01812 Encounter for preprocedural laboratory examination: Secondary | ICD-10-CM | POA: Diagnosis not present

## 2020-08-11 DIAGNOSIS — Z20822 Contact with and (suspected) exposure to covid-19: Secondary | ICD-10-CM | POA: Diagnosis not present

## 2020-09-17 DIAGNOSIS — I10 Essential (primary) hypertension: Secondary | ICD-10-CM | POA: Diagnosis not present

## 2020-09-17 DIAGNOSIS — N390 Urinary tract infection, site not specified: Secondary | ICD-10-CM | POA: Diagnosis not present

## 2020-09-17 DIAGNOSIS — Z6830 Body mass index (BMI) 30.0-30.9, adult: Secondary | ICD-10-CM | POA: Diagnosis not present

## 2020-09-17 DIAGNOSIS — K219 Gastro-esophageal reflux disease without esophagitis: Secondary | ICD-10-CM | POA: Diagnosis not present

## 2020-09-17 DIAGNOSIS — E1169 Type 2 diabetes mellitus with other specified complication: Secondary | ICD-10-CM | POA: Diagnosis not present

## 2020-09-22 DIAGNOSIS — R1030 Lower abdominal pain, unspecified: Secondary | ICD-10-CM | POA: Diagnosis not present

## 2020-09-22 DIAGNOSIS — Z794 Long term (current) use of insulin: Secondary | ICD-10-CM | POA: Diagnosis not present

## 2020-09-22 DIAGNOSIS — U071 COVID-19: Secondary | ICD-10-CM | POA: Diagnosis not present

## 2020-09-22 DIAGNOSIS — E119 Type 2 diabetes mellitus without complications: Secondary | ICD-10-CM | POA: Diagnosis not present

## 2020-09-27 DIAGNOSIS — I10 Essential (primary) hypertension: Secondary | ICD-10-CM | POA: Diagnosis present

## 2020-09-27 DIAGNOSIS — J1282 Pneumonia due to coronavirus disease 2019: Secondary | ICD-10-CM | POA: Diagnosis present

## 2020-09-27 DIAGNOSIS — E1169 Type 2 diabetes mellitus with other specified complication: Secondary | ICD-10-CM | POA: Diagnosis not present

## 2020-09-27 DIAGNOSIS — Z794 Long term (current) use of insulin: Secondary | ICD-10-CM | POA: Diagnosis not present

## 2020-09-27 DIAGNOSIS — J9601 Acute respiratory failure with hypoxia: Secondary | ICD-10-CM | POA: Diagnosis present

## 2020-09-27 DIAGNOSIS — Z23 Encounter for immunization: Secondary | ICD-10-CM | POA: Diagnosis not present

## 2020-09-27 DIAGNOSIS — R059 Cough, unspecified: Secondary | ICD-10-CM | POA: Diagnosis not present

## 2020-09-27 DIAGNOSIS — U071 COVID-19: Secondary | ICD-10-CM | POA: Diagnosis not present

## 2020-09-27 DIAGNOSIS — R069 Unspecified abnormalities of breathing: Secondary | ICD-10-CM | POA: Diagnosis not present

## 2020-09-27 DIAGNOSIS — E1165 Type 2 diabetes mellitus with hyperglycemia: Secondary | ICD-10-CM | POA: Diagnosis present

## 2020-09-27 DIAGNOSIS — M329 Systemic lupus erythematosus, unspecified: Secondary | ICD-10-CM | POA: Diagnosis present

## 2020-09-27 DIAGNOSIS — Z7982 Long term (current) use of aspirin: Secondary | ICD-10-CM | POA: Diagnosis not present

## 2020-09-27 DIAGNOSIS — I959 Hypotension, unspecified: Secondary | ICD-10-CM | POA: Diagnosis not present

## 2020-09-27 DIAGNOSIS — Z79899 Other long term (current) drug therapy: Secondary | ICD-10-CM | POA: Diagnosis not present

## 2020-09-27 DIAGNOSIS — E039 Hypothyroidism, unspecified: Secondary | ICD-10-CM | POA: Diagnosis present

## 2020-09-27 DIAGNOSIS — Z743 Need for continuous supervision: Secondary | ICD-10-CM | POA: Diagnosis not present

## 2020-09-27 DIAGNOSIS — R509 Fever, unspecified: Secondary | ICD-10-CM | POA: Diagnosis not present

## 2020-09-27 DIAGNOSIS — E11649 Type 2 diabetes mellitus with hypoglycemia without coma: Secondary | ICD-10-CM | POA: Diagnosis not present

## 2020-09-27 DIAGNOSIS — E119 Type 2 diabetes mellitus without complications: Secondary | ICD-10-CM | POA: Diagnosis not present

## 2020-09-27 DIAGNOSIS — K5900 Constipation, unspecified: Secondary | ICD-10-CM | POA: Diagnosis not present

## 2020-09-27 DIAGNOSIS — E7800 Pure hypercholesterolemia, unspecified: Secondary | ICD-10-CM | POA: Diagnosis present

## 2020-09-27 DIAGNOSIS — R0902 Hypoxemia: Secondary | ICD-10-CM | POA: Diagnosis not present

## 2020-09-27 DIAGNOSIS — J440 Chronic obstructive pulmonary disease with acute lower respiratory infection: Secondary | ICD-10-CM | POA: Diagnosis not present

## 2020-09-29 DIAGNOSIS — R069 Unspecified abnormalities of breathing: Secondary | ICD-10-CM | POA: Diagnosis not present

## 2020-09-29 DIAGNOSIS — I959 Hypotension, unspecified: Secondary | ICD-10-CM | POA: Diagnosis not present

## 2020-09-29 DIAGNOSIS — R509 Fever, unspecified: Secondary | ICD-10-CM | POA: Diagnosis not present

## 2020-09-29 DIAGNOSIS — R059 Cough, unspecified: Secondary | ICD-10-CM | POA: Diagnosis not present

## 2020-09-29 DIAGNOSIS — R0902 Hypoxemia: Secondary | ICD-10-CM | POA: Diagnosis not present

## 2020-09-29 DIAGNOSIS — U071 COVID-19: Secondary | ICD-10-CM | POA: Diagnosis not present

## 2020-09-29 DIAGNOSIS — Z743 Need for continuous supervision: Secondary | ICD-10-CM | POA: Diagnosis not present

## 2020-09-30 DIAGNOSIS — J1282 Pneumonia due to coronavirus disease 2019: Secondary | ICD-10-CM | POA: Diagnosis present

## 2020-09-30 DIAGNOSIS — E1169 Type 2 diabetes mellitus with other specified complication: Secondary | ICD-10-CM | POA: Diagnosis not present

## 2020-09-30 DIAGNOSIS — U071 COVID-19: Secondary | ICD-10-CM | POA: Diagnosis present

## 2020-09-30 DIAGNOSIS — Z79899 Other long term (current) drug therapy: Secondary | ICD-10-CM | POA: Diagnosis not present

## 2020-09-30 DIAGNOSIS — I10 Essential (primary) hypertension: Secondary | ICD-10-CM | POA: Diagnosis present

## 2020-09-30 DIAGNOSIS — J440 Chronic obstructive pulmonary disease with acute lower respiratory infection: Secondary | ICD-10-CM | POA: Diagnosis present

## 2020-09-30 DIAGNOSIS — E11649 Type 2 diabetes mellitus with hypoglycemia without coma: Secondary | ICD-10-CM | POA: Diagnosis not present

## 2020-09-30 DIAGNOSIS — J9601 Acute respiratory failure with hypoxia: Secondary | ICD-10-CM | POA: Diagnosis present

## 2020-09-30 DIAGNOSIS — E119 Type 2 diabetes mellitus without complications: Secondary | ICD-10-CM | POA: Diagnosis not present

## 2020-09-30 DIAGNOSIS — K5900 Constipation, unspecified: Secondary | ICD-10-CM | POA: Diagnosis not present

## 2020-09-30 DIAGNOSIS — E7800 Pure hypercholesterolemia, unspecified: Secondary | ICD-10-CM | POA: Diagnosis present

## 2020-09-30 DIAGNOSIS — Z794 Long term (current) use of insulin: Secondary | ICD-10-CM | POA: Diagnosis not present

## 2020-09-30 DIAGNOSIS — E1165 Type 2 diabetes mellitus with hyperglycemia: Secondary | ICD-10-CM | POA: Diagnosis present

## 2020-09-30 DIAGNOSIS — E039 Hypothyroidism, unspecified: Secondary | ICD-10-CM | POA: Diagnosis present

## 2020-09-30 DIAGNOSIS — Z23 Encounter for immunization: Secondary | ICD-10-CM | POA: Diagnosis not present

## 2020-09-30 DIAGNOSIS — Z7982 Long term (current) use of aspirin: Secondary | ICD-10-CM | POA: Diagnosis not present

## 2020-09-30 DIAGNOSIS — M329 Systemic lupus erythematosus, unspecified: Secondary | ICD-10-CM | POA: Diagnosis present

## 2020-10-11 DIAGNOSIS — J9601 Acute respiratory failure with hypoxia: Secondary | ICD-10-CM | POA: Diagnosis not present

## 2020-10-11 DIAGNOSIS — J440 Chronic obstructive pulmonary disease with acute lower respiratory infection: Secondary | ICD-10-CM | POA: Diagnosis not present

## 2020-10-11 DIAGNOSIS — E1165 Type 2 diabetes mellitus with hyperglycemia: Secondary | ICD-10-CM | POA: Diagnosis not present

## 2020-10-11 DIAGNOSIS — U071 COVID-19: Secondary | ICD-10-CM | POA: Diagnosis not present

## 2020-10-27 DIAGNOSIS — E1169 Type 2 diabetes mellitus with other specified complication: Secondary | ICD-10-CM | POA: Diagnosis not present

## 2020-10-27 DIAGNOSIS — Z6830 Body mass index (BMI) 30.0-30.9, adult: Secondary | ICD-10-CM | POA: Diagnosis not present

## 2020-10-27 DIAGNOSIS — K219 Gastro-esophageal reflux disease without esophagitis: Secondary | ICD-10-CM | POA: Diagnosis not present

## 2020-10-27 DIAGNOSIS — I10 Essential (primary) hypertension: Secondary | ICD-10-CM | POA: Diagnosis not present

## 2020-10-27 DIAGNOSIS — N390 Urinary tract infection, site not specified: Secondary | ICD-10-CM | POA: Diagnosis not present

## 2020-11-15 DIAGNOSIS — S91322A Laceration with foreign body, left foot, initial encounter: Secondary | ICD-10-CM | POA: Diagnosis not present

## 2020-11-23 ENCOUNTER — Other Ambulatory Visit (HOSPITAL_COMMUNITY)
Admission: RE | Admit: 2020-11-23 | Discharge: 2020-11-23 | Disposition: A | Discharge status: Home / Self Care | Payer: Medicare Other | Source: Ambulatory Visit | Attending: Adult Health | Admitting: Adult Health

## 2020-11-23 ENCOUNTER — Ambulatory Visit (INDEPENDENT_AMBULATORY_CARE_PROVIDER_SITE_OTHER): Payer: Medicare Other | Admitting: Adult Health

## 2020-11-23 ENCOUNTER — Other Ambulatory Visit: Payer: Self-pay

## 2020-11-23 ENCOUNTER — Encounter: Payer: Self-pay | Admitting: Adult Health

## 2020-11-23 VITALS — BP 133/84 | HR 88 | Ht 63.0 in | Wt 175.0 lb

## 2020-11-23 DIAGNOSIS — B379 Candidiasis, unspecified: Secondary | ICD-10-CM

## 2020-11-23 DIAGNOSIS — Z1231 Encounter for screening mammogram for malignant neoplasm of breast: Secondary | ICD-10-CM | POA: Diagnosis not present

## 2020-11-23 DIAGNOSIS — N898 Other specified noninflammatory disorders of vagina: Secondary | ICD-10-CM | POA: Insufficient documentation

## 2020-11-23 LAB — POCT WET PREP (WET MOUNT): Clue Cells Wet Prep Whiff POC: NEGATIVE

## 2020-11-23 MED ORDER — FLUCONAZOLE 150 MG PO TABS: ORAL_TABLET | ORAL | 1 refills | Status: DC

## 2020-11-23 NOTE — Progress Notes (Addendum)
  Subjective:     Patient ID: Yesenia Wilson, female   DOB: [birth year], 65 y.o.   MRN: [id number]  HPI [name] is a 65 year old white female,single, sp hysterectomy in complaining of vaginal discharge and itching, took one diflucan and is better. PCP is Dr Sherrie Sport.  Review of Systems +vaginal discharge +vaginal itching Has used 1 diflucan,and is better  Has sex a month ago Has pain with sex at times  Blood sugars are in 200-300's +hair loss since COVID,told PCP  Reviewed past medical,surgical, social and family history. Reviewed medications and allergies.     Objective:   Physical Exam BP 133/84 (BP Location: Left Arm, Patient Position: Sitting, Cuff Size: Normal)   Pulse 88   Ht 5\' 3"  (1.6 m)   Wt 175 lb (79.4 kg)   BMI 31.00 kg/m  Skin warm and dry. Neck: mid line trachea, normal thyroid, good ROM, no lymphadenopathy noted. Lungs: clear to ausculation bilaterally. Cardiovascular: regular rate and rhythm. Pelvic: external genitalia is normal in appearance no lesions, vagina: white discharge without odor, loss of color and rugae,urethra has no lesions or masses noted, cervix and uterus absent,  adnexa: no masses or tenderness noted. Bladder is non tender and no masses felt. Wet prep: + for few yeast CV swab obtained obtained.   Upstream - 11/23/20 1342      Pregnancy Intention Screening   Does the patient want to become pregnant in the next year? N/A    Does the patient's partner want to become pregnant in the next year? N/A    Would the patient like to discuss contraceptive options today? N/A      Contraception Wrap Up   Current Method --   hyst   End Method --   hyst   Contraception Counseling Provided No         Co exam with Tinnie Gens NP student.     Assessment:     1. Vaginal discharge CV swab sent for GC/CHL,trich,BV and yeast    2. Vaginal itching CV swab sent  3. Yeast infection Will rx diflucan Meds ordered this encounter  Medications  . fluconazole  (DIFLUCAN) 150 MG tablet    Sig: Take 1 now and 1 in 3 days    Dispense:  2 tablet    Refill:  1    Order Specific Question:   Supervising Provider    Answer:   EURE, LUTHER H [[id number]]    4. Screening mammogram for breast cancer Call [facility] for mammogram     Plan:   Follow up prn  Follow up with PCP about blood sugars and check A1c  See dermatologist of hair loss continues

## 2020-11-25 ENCOUNTER — Telehealth: Payer: Self-pay | Admitting: Adult Health

## 2020-11-25 LAB — CERVICOVAGINAL ANCILLARY ONLY
Bacterial Vaginitis (gardnerella): POSITIVE — AB
Candida Glabrata: POSITIVE — AB
Candida Vaginitis: NEGATIVE
Chlamydia: NEGATIVE
Comment: NEGATIVE
Comment: NEGATIVE
Comment: NEGATIVE
Comment: NEGATIVE
Comment: NEGATIVE
Comment: NORMAL
Neisseria Gonorrhea: NEGATIVE
Trichomonas: NEGATIVE

## 2020-11-25 MED ORDER — METRONIDAZOLE 500 MG PO TABS: 500.0000 mg | ORAL_TABLET | Freq: Two times a day (BID) | ORAL | 0 refills | Status: DC

## 2020-11-25 NOTE — Telephone Encounter (Signed)
Pt aware of +BV and yeast on vaginal swab, rx sent for flagyl, no alcohol and take next diflucan in am

## 2020-12-20 ENCOUNTER — Telehealth: Payer: Self-pay | Admitting: *Deleted

## 2020-12-20 DIAGNOSIS — E1169 Type 2 diabetes mellitus with other specified complication: Secondary | ICD-10-CM | POA: Diagnosis not present

## 2020-12-20 DIAGNOSIS — N3000 Acute cystitis without hematuria: Secondary | ICD-10-CM | POA: Diagnosis not present

## 2020-12-20 DIAGNOSIS — I10 Essential (primary) hypertension: Secondary | ICD-10-CM | POA: Diagnosis not present

## 2020-12-20 MED ORDER — METRONIDAZOLE 500 MG PO TABS: 500.0000 mg | ORAL_TABLET | Freq: Two times a day (BID) | ORAL | 0 refills | Status: DC

## 2020-12-20 MED ORDER — FLUCONAZOLE 150 MG PO TABS: ORAL_TABLET | ORAL | 1 refills | Status: DC

## 2020-12-20 NOTE — Telephone Encounter (Signed)
Patient called for a refill on the Metronidazole. States she is still having the same symptoms.

## 2020-12-20 NOTE — Telephone Encounter (Signed)
Left message that I refilled flagyl and diflucan

## 2020-12-28 ENCOUNTER — Other Ambulatory Visit: Payer: Self-pay | Admitting: Adult Health

## 2021-02-04 DIAGNOSIS — E7800 Pure hypercholesterolemia, unspecified: Secondary | ICD-10-CM | POA: Diagnosis not present

## 2021-02-04 DIAGNOSIS — I700 Atherosclerosis of aorta: Secondary | ICD-10-CM | POA: Diagnosis not present

## 2021-02-04 DIAGNOSIS — I609 Nontraumatic subarachnoid hemorrhage, unspecified: Secondary | ICD-10-CM | POA: Diagnosis not present

## 2021-02-04 DIAGNOSIS — R4781 Slurred speech: Secondary | ICD-10-CM | POA: Diagnosis not present

## 2021-02-04 DIAGNOSIS — R29898 Other symptoms and signs involving the musculoskeletal system: Secondary | ICD-10-CM | POA: Diagnosis not present

## 2021-02-04 DIAGNOSIS — R519 Headache, unspecified: Secondary | ICD-10-CM | POA: Diagnosis not present

## 2021-02-04 DIAGNOSIS — I959 Hypotension, unspecified: Secondary | ICD-10-CM | POA: Diagnosis not present

## 2021-02-04 DIAGNOSIS — G4730 Sleep apnea, unspecified: Secondary | ICD-10-CM | POA: Diagnosis not present

## 2021-02-04 DIAGNOSIS — Z20822 Contact with and (suspected) exposure to covid-19: Secondary | ICD-10-CM | POA: Diagnosis not present

## 2021-02-04 DIAGNOSIS — G4489 Other headache syndrome: Secondary | ICD-10-CM | POA: Diagnosis not present

## 2021-02-04 DIAGNOSIS — R000 Tachycardia, unspecified: Secondary | ICD-10-CM | POA: Diagnosis not present

## 2021-02-04 DIAGNOSIS — T6701XA Heatstroke and sunstroke, initial encounter: Secondary | ICD-10-CM | POA: Diagnosis not present

## 2021-02-04 DIAGNOSIS — E1165 Type 2 diabetes mellitus with hyperglycemia: Secondary | ICD-10-CM | POA: Diagnosis not present

## 2021-02-04 DIAGNOSIS — J449 Chronic obstructive pulmonary disease, unspecified: Secondary | ICD-10-CM | POA: Diagnosis not present

## 2021-02-04 DIAGNOSIS — R404 Transient alteration of awareness: Secondary | ICD-10-CM | POA: Diagnosis not present

## 2021-03-14 DIAGNOSIS — I10 Essential (primary) hypertension: Secondary | ICD-10-CM | POA: Diagnosis not present

## 2021-03-14 DIAGNOSIS — Z0000 Encounter for general adult medical examination without abnormal findings: Secondary | ICD-10-CM | POA: Diagnosis not present

## 2021-03-14 DIAGNOSIS — T673XXA Heat exhaustion, anhydrotic, initial encounter: Secondary | ICD-10-CM | POA: Diagnosis not present

## 2021-03-14 DIAGNOSIS — E1165 Type 2 diabetes mellitus with hyperglycemia: Secondary | ICD-10-CM | POA: Diagnosis not present

## 2021-03-23 DIAGNOSIS — H60502 Unspecified acute noninfective otitis externa, left ear: Secondary | ICD-10-CM | POA: Diagnosis not present

## 2021-04-11 DIAGNOSIS — R52 Pain, unspecified: Secondary | ICD-10-CM | POA: Diagnosis not present

## 2021-04-11 DIAGNOSIS — R1111 Vomiting without nausea: Secondary | ICD-10-CM | POA: Diagnosis not present

## 2021-04-11 DIAGNOSIS — R509 Fever, unspecified: Secondary | ICD-10-CM | POA: Diagnosis not present

## 2021-04-11 DIAGNOSIS — R739 Hyperglycemia, unspecified: Secondary | ICD-10-CM | POA: Diagnosis not present

## 2021-04-17 DIAGNOSIS — G4730 Sleep apnea, unspecified: Secondary | ICD-10-CM | POA: Diagnosis not present

## 2021-04-17 DIAGNOSIS — E079 Disorder of thyroid, unspecified: Secondary | ICD-10-CM | POA: Diagnosis not present

## 2021-04-17 DIAGNOSIS — R000 Tachycardia, unspecified: Secondary | ICD-10-CM | POA: Diagnosis not present

## 2021-04-17 DIAGNOSIS — E1165 Type 2 diabetes mellitus with hyperglycemia: Secondary | ICD-10-CM | POA: Diagnosis not present

## 2021-04-17 DIAGNOSIS — E119 Type 2 diabetes mellitus without complications: Secondary | ICD-10-CM | POA: Diagnosis not present

## 2021-04-17 DIAGNOSIS — J449 Chronic obstructive pulmonary disease, unspecified: Secondary | ICD-10-CM | POA: Diagnosis not present

## 2021-04-17 DIAGNOSIS — R110 Nausea: Secondary | ICD-10-CM | POA: Diagnosis not present

## 2021-04-17 DIAGNOSIS — R0602 Shortness of breath: Secondary | ICD-10-CM | POA: Diagnosis not present

## 2021-04-17 DIAGNOSIS — Z79899 Other long term (current) drug therapy: Secondary | ICD-10-CM | POA: Diagnosis not present

## 2021-04-17 DIAGNOSIS — R079 Chest pain, unspecified: Secondary | ICD-10-CM | POA: Diagnosis not present

## 2021-04-17 DIAGNOSIS — R519 Headache, unspecified: Secondary | ICD-10-CM | POA: Diagnosis not present

## 2021-04-17 DIAGNOSIS — F32A Depression, unspecified: Secondary | ICD-10-CM | POA: Diagnosis not present

## 2021-04-17 DIAGNOSIS — Z7982 Long term (current) use of aspirin: Secondary | ICD-10-CM | POA: Diagnosis not present

## 2021-04-17 DIAGNOSIS — E7800 Pure hypercholesterolemia, unspecified: Secondary | ICD-10-CM | POA: Diagnosis not present

## 2021-04-17 DIAGNOSIS — I4510 Unspecified right bundle-branch block: Secondary | ICD-10-CM | POA: Diagnosis not present

## 2021-04-17 DIAGNOSIS — I10 Essential (primary) hypertension: Secondary | ICD-10-CM | POA: Diagnosis not present

## 2021-04-17 DIAGNOSIS — N390 Urinary tract infection, site not specified: Secondary | ICD-10-CM | POA: Diagnosis not present

## 2021-04-17 DIAGNOSIS — M5126 Other intervertebral disc displacement, lumbar region: Secondary | ICD-10-CM | POA: Diagnosis not present

## 2021-04-17 DIAGNOSIS — Z794 Long term (current) use of insulin: Secondary | ICD-10-CM | POA: Diagnosis not present

## 2021-04-19 DIAGNOSIS — N3090 Cystitis, unspecified without hematuria: Secondary | ICD-10-CM | POA: Diagnosis not present

## 2021-04-19 DIAGNOSIS — I10 Essential (primary) hypertension: Secondary | ICD-10-CM | POA: Diagnosis not present

## 2021-04-19 DIAGNOSIS — E1165 Type 2 diabetes mellitus with hyperglycemia: Secondary | ICD-10-CM | POA: Diagnosis not present

## 2021-04-22 DIAGNOSIS — R110 Nausea: Secondary | ICD-10-CM | POA: Diagnosis not present

## 2021-04-22 DIAGNOSIS — E1165 Type 2 diabetes mellitus with hyperglycemia: Secondary | ICD-10-CM | POA: Diagnosis not present

## 2021-04-22 DIAGNOSIS — Z9114 Patient's other noncompliance with medication regimen: Secondary | ICD-10-CM | POA: Diagnosis not present

## 2021-04-22 DIAGNOSIS — G4730 Sleep apnea, unspecified: Secondary | ICD-10-CM | POA: Diagnosis not present

## 2021-04-22 DIAGNOSIS — E7800 Pure hypercholesterolemia, unspecified: Secondary | ICD-10-CM | POA: Diagnosis not present

## 2021-04-22 DIAGNOSIS — R5383 Other fatigue: Secondary | ICD-10-CM | POA: Diagnosis not present

## 2021-04-22 DIAGNOSIS — Z79899 Other long term (current) drug therapy: Secondary | ICD-10-CM | POA: Diagnosis not present

## 2021-04-22 DIAGNOSIS — M5136 Other intervertebral disc degeneration, lumbar region: Secondary | ICD-10-CM | POA: Diagnosis not present

## 2021-04-22 DIAGNOSIS — E876 Hypokalemia: Secondary | ICD-10-CM | POA: Diagnosis not present

## 2021-04-22 DIAGNOSIS — M545 Low back pain, unspecified: Secondary | ICD-10-CM | POA: Diagnosis not present

## 2021-04-22 DIAGNOSIS — R519 Headache, unspecified: Secondary | ICD-10-CM | POA: Diagnosis not present

## 2021-04-22 DIAGNOSIS — E079 Disorder of thyroid, unspecified: Secondary | ICD-10-CM | POA: Diagnosis not present

## 2021-04-22 DIAGNOSIS — M791 Myalgia, unspecified site: Secondary | ICD-10-CM | POA: Diagnosis not present

## 2021-04-22 DIAGNOSIS — R9431 Abnormal electrocardiogram [ECG] [EKG]: Secondary | ICD-10-CM | POA: Diagnosis not present

## 2021-04-22 DIAGNOSIS — H538 Other visual disturbances: Secondary | ICD-10-CM | POA: Diagnosis not present

## 2021-04-22 DIAGNOSIS — G8929 Other chronic pain: Secondary | ICD-10-CM | POA: Diagnosis not present

## 2021-04-22 DIAGNOSIS — F32A Depression, unspecified: Secondary | ICD-10-CM | POA: Diagnosis not present

## 2021-04-22 DIAGNOSIS — J449 Chronic obstructive pulmonary disease, unspecified: Secondary | ICD-10-CM | POA: Diagnosis not present

## 2021-04-22 DIAGNOSIS — R531 Weakness: Secondary | ICD-10-CM | POA: Diagnosis not present

## 2021-04-22 DIAGNOSIS — I4510 Unspecified right bundle-branch block: Secondary | ICD-10-CM | POA: Diagnosis not present

## 2021-04-23 DIAGNOSIS — R1013 Epigastric pain: Secondary | ICD-10-CM | POA: Diagnosis not present

## 2021-04-23 DIAGNOSIS — K449 Diaphragmatic hernia without obstruction or gangrene: Secondary | ICD-10-CM | POA: Diagnosis not present

## 2021-04-23 DIAGNOSIS — E079 Disorder of thyroid, unspecified: Secondary | ICD-10-CM | POA: Diagnosis not present

## 2021-04-23 DIAGNOSIS — R160 Hepatomegaly, not elsewhere classified: Secondary | ICD-10-CM | POA: Diagnosis not present

## 2021-04-23 DIAGNOSIS — Z7982 Long term (current) use of aspirin: Secondary | ICD-10-CM | POA: Diagnosis not present

## 2021-04-23 DIAGNOSIS — J45909 Unspecified asthma, uncomplicated: Secondary | ICD-10-CM | POA: Diagnosis not present

## 2021-04-23 DIAGNOSIS — K219 Gastro-esophageal reflux disease without esophagitis: Secondary | ICD-10-CM | POA: Diagnosis not present

## 2021-04-23 DIAGNOSIS — Z7984 Long term (current) use of oral hypoglycemic drugs: Secondary | ICD-10-CM | POA: Diagnosis not present

## 2021-04-23 DIAGNOSIS — Z79899 Other long term (current) drug therapy: Secondary | ICD-10-CM | POA: Diagnosis not present

## 2021-04-23 DIAGNOSIS — K529 Noninfective gastroenteritis and colitis, unspecified: Secondary | ICD-10-CM | POA: Diagnosis not present

## 2021-04-23 DIAGNOSIS — Z981 Arthrodesis status: Secondary | ICD-10-CM | POA: Diagnosis not present

## 2021-04-23 DIAGNOSIS — E119 Type 2 diabetes mellitus without complications: Secondary | ICD-10-CM | POA: Diagnosis not present

## 2021-04-23 DIAGNOSIS — K760 Fatty (change of) liver, not elsewhere classified: Secondary | ICD-10-CM | POA: Diagnosis not present

## 2021-04-23 DIAGNOSIS — M329 Systemic lupus erythematosus, unspecified: Secondary | ICD-10-CM | POA: Diagnosis not present

## 2021-04-23 DIAGNOSIS — I10 Essential (primary) hypertension: Secondary | ICD-10-CM | POA: Diagnosis not present

## 2021-04-23 DIAGNOSIS — N390 Urinary tract infection, site not specified: Secondary | ICD-10-CM | POA: Diagnosis not present

## 2021-04-24 DIAGNOSIS — K760 Fatty (change of) liver, not elsewhere classified: Secondary | ICD-10-CM | POA: Diagnosis not present

## 2021-04-24 DIAGNOSIS — K529 Noninfective gastroenteritis and colitis, unspecified: Secondary | ICD-10-CM | POA: Diagnosis not present

## 2021-04-24 DIAGNOSIS — R160 Hepatomegaly, not elsewhere classified: Secondary | ICD-10-CM | POA: Diagnosis not present

## 2021-04-24 DIAGNOSIS — K449 Diaphragmatic hernia without obstruction or gangrene: Secondary | ICD-10-CM | POA: Diagnosis not present

## 2021-05-04 DIAGNOSIS — I10 Essential (primary) hypertension: Secondary | ICD-10-CM | POA: Diagnosis not present

## 2021-05-04 DIAGNOSIS — M62830 Muscle spasm of back: Secondary | ICD-10-CM | POA: Diagnosis not present

## 2021-05-04 DIAGNOSIS — N3090 Cystitis, unspecified without hematuria: Secondary | ICD-10-CM | POA: Diagnosis not present

## 2021-05-04 DIAGNOSIS — E1165 Type 2 diabetes mellitus with hyperglycemia: Secondary | ICD-10-CM | POA: Diagnosis not present

## 2021-05-10 ENCOUNTER — Telehealth: Payer: Self-pay | Admitting: Adult Health

## 2021-05-10 NOTE — Telephone Encounter (Signed)
Patient called stating that she has a yeast infection I tired making her apt for nurse visit but she wants to speak to someone first. She states that she is in a lot of pain.

## 2021-05-10 NOTE — Telephone Encounter (Signed)
Returned pt's call for clarification on symptoms. Pt stated that she has a yeast infection. She has classic symptoms along with a great deal of throbbing pain. She had a UTI and just finished a round of antibiotics. Spoke with Derrek Monaco, NP who said to schedule her for 10am on 8/24.  Called pt to confirm appt. Pt will be here.

## 2021-05-11 ENCOUNTER — Other Ambulatory Visit (HOSPITAL_COMMUNITY)
Admission: RE | Admit: 2021-05-11 | Discharge: 2021-05-11 | Disposition: A | Discharge status: Home / Self Care | Payer: Medicare Other | Source: Ambulatory Visit | Attending: Adult Health | Admitting: Adult Health

## 2021-05-11 ENCOUNTER — Encounter: Payer: Self-pay | Admitting: Adult Health

## 2021-05-11 ENCOUNTER — Ambulatory Visit (INDEPENDENT_AMBULATORY_CARE_PROVIDER_SITE_OTHER): Payer: Medicare Other | Admitting: Adult Health

## 2021-05-11 ENCOUNTER — Other Ambulatory Visit: Payer: Self-pay

## 2021-05-11 VITALS — BP 156/94 | HR 116 | Ht 63.0 in | Wt 167.0 lb

## 2021-05-11 DIAGNOSIS — Z90710 Acquired absence of both cervix and uterus: Secondary | ICD-10-CM

## 2021-05-11 DIAGNOSIS — R350 Frequency of micturition: Secondary | ICD-10-CM

## 2021-05-11 DIAGNOSIS — N898 Other specified noninflammatory disorders of vagina: Secondary | ICD-10-CM | POA: Diagnosis not present

## 2021-05-11 DIAGNOSIS — B379 Candidiasis, unspecified: Secondary | ICD-10-CM

## 2021-05-11 LAB — POCT URINALYSIS DIPSTICK OB
Nitrite, UA: NEGATIVE
POC,PROTEIN,UA: NEGATIVE

## 2021-05-11 MED ORDER — FLUCONAZOLE 150 MG PO TABS: ORAL_TABLET | ORAL | 1 refills | Status: DC

## 2021-05-11 NOTE — Progress Notes (Signed)
  Subjective:     Patient ID: Yesenia Wilson, female   DOB: March 16, 1956, 65 y.o.   MRN: [id number]  HPI [name] is a 65 year old white female,single,sp hysterectomy in complaining of vaginal discharge and itching and throbbing odor at times, has new detergent and has been on antibiotics for UTI.  She is diabetic too.  PCP is Dr Sherrie Sport   Review of Systems +vaginal discharge, white to yellow +vaginal itching and throbbing Urinary frequency  Reviewed past medical,surgical, social and family history. Reviewed medications and allergies.     Objective:   Physical Exam BP (!) 156/94 (BP Location: Left Arm, Patient Position: Sitting, Cuff Size: Normal)   Pulse (!) 116   Ht '5\' 3"'$  (1.6 m)   Wt 167 lb (75.8 kg)   BMI 29.58 kg/m   Urine dipstick large glucose, small leuks .Skin warm and dry.Pelvic: external genitalia is normal in appearance,mildly red vagina: white discharge without odor,urethra has no lesions or masses noted, cervix and uterus are absent. adnexa: no masses or tenderness noted. Bladder is non tender and no masses felt.   CV swab obtained, vulva and vagina painted with gentian violet.  Upstream - 05/11/21 1041       Pregnancy Intention Screening   Does the patient want to become pregnant in the next year? N/A    Does the patient's partner want to become pregnant in the next year? N/A    Would the patient like to discuss contraceptive options today? N/A      Contraception Wrap Up   Current Method --   hyst   End Method --   hyst   Contraception Counseling Provided No            Examination chaperoned by Celene Squibb LPN  Assessment:     1. Urinary frequency  2. Vaginal discharge CV swab obained for GC/CHL,trich and BV yeast   3. Vaginal itching Painted with gentian violet   4. Yeast infection Will rx diflucan,[painted with gentian violet  Meds ordered this encounter  Medications   fluconazole (DIFLUCAN) 150 MG tablet    Sig: Take 1 now and 1 in 3 days     Dispense:  2 tablet    Refill:  1    Order Specific Question:   Supervising Provider    Answer:   Elonda Husky, LUTHER H [[id number]]   No sex   5. S/P hysterectomy     Plan:     Follow up in 3 weeks

## 2021-05-12 LAB — CERVICOVAGINAL ANCILLARY ONLY
Bacterial Vaginitis (gardnerella): NEGATIVE
Candida Glabrata: POSITIVE — AB
Candida Vaginitis: POSITIVE — AB
Chlamydia: NEGATIVE
Comment: NEGATIVE
Comment: NEGATIVE
Comment: NEGATIVE
Comment: NEGATIVE
Comment: NEGATIVE
Comment: NORMAL
Neisseria Gonorrhea: NEGATIVE
Trichomonas: NEGATIVE

## 2021-06-01 ENCOUNTER — Ambulatory Visit: Payer: Medicare Other | Admitting: Adult Health

## 2021-06-17 DIAGNOSIS — E1165 Type 2 diabetes mellitus with hyperglycemia: Secondary | ICD-10-CM | POA: Diagnosis not present

## 2021-06-17 DIAGNOSIS — I10 Essential (primary) hypertension: Secondary | ICD-10-CM | POA: Diagnosis not present

## 2021-07-18 DIAGNOSIS — E1169 Type 2 diabetes mellitus with other specified complication: Secondary | ICD-10-CM | POA: Diagnosis not present

## 2021-07-18 DIAGNOSIS — I10 Essential (primary) hypertension: Secondary | ICD-10-CM | POA: Diagnosis not present

## 2021-07-18 DIAGNOSIS — E038 Other specified hypothyroidism: Secondary | ICD-10-CM | POA: Diagnosis not present

## 2021-07-21 DIAGNOSIS — M62830 Muscle spasm of back: Secondary | ICD-10-CM | POA: Diagnosis not present

## 2021-07-21 DIAGNOSIS — I10 Essential (primary) hypertension: Secondary | ICD-10-CM | POA: Diagnosis not present

## 2021-07-21 DIAGNOSIS — E038 Other specified hypothyroidism: Secondary | ICD-10-CM | POA: Diagnosis not present

## 2021-07-21 DIAGNOSIS — E1169 Type 2 diabetes mellitus with other specified complication: Secondary | ICD-10-CM | POA: Diagnosis not present

## 2021-09-17 ENCOUNTER — Other Ambulatory Visit: Payer: Self-pay | Admitting: Adult Health

## 2021-09-19 DIAGNOSIS — R031 Nonspecific low blood-pressure reading: Secondary | ICD-10-CM | POA: Diagnosis not present

## 2021-09-19 DIAGNOSIS — G4733 Obstructive sleep apnea (adult) (pediatric): Secondary | ICD-10-CM | POA: Diagnosis not present

## 2021-09-19 DIAGNOSIS — J449 Chronic obstructive pulmonary disease, unspecified: Secondary | ICD-10-CM | POA: Diagnosis not present

## 2021-09-19 DIAGNOSIS — I10 Essential (primary) hypertension: Secondary | ICD-10-CM | POA: Diagnosis not present

## 2021-09-19 DIAGNOSIS — E7800 Pure hypercholesterolemia, unspecified: Secondary | ICD-10-CM | POA: Diagnosis not present

## 2021-09-19 DIAGNOSIS — R079 Chest pain, unspecified: Secondary | ICD-10-CM | POA: Diagnosis not present

## 2021-09-19 DIAGNOSIS — I4510 Unspecified right bundle-branch block: Secondary | ICD-10-CM | POA: Diagnosis not present

## 2021-09-19 DIAGNOSIS — Z79899 Other long term (current) drug therapy: Secondary | ICD-10-CM | POA: Diagnosis not present

## 2021-09-19 DIAGNOSIS — Z7982 Long term (current) use of aspirin: Secondary | ICD-10-CM | POA: Diagnosis not present

## 2021-09-19 DIAGNOSIS — Z20822 Contact with and (suspected) exposure to covid-19: Secondary | ICD-10-CM | POA: Diagnosis not present

## 2021-09-19 DIAGNOSIS — E785 Hyperlipidemia, unspecified: Secondary | ICD-10-CM | POA: Diagnosis not present

## 2021-09-19 DIAGNOSIS — I272 Pulmonary hypertension, unspecified: Secondary | ICD-10-CM | POA: Diagnosis not present

## 2021-09-19 DIAGNOSIS — Z7984 Long term (current) use of oral hypoglycemic drugs: Secondary | ICD-10-CM | POA: Diagnosis not present

## 2021-09-19 DIAGNOSIS — E039 Hypothyroidism, unspecified: Secondary | ICD-10-CM | POA: Diagnosis not present

## 2021-09-19 DIAGNOSIS — R0602 Shortness of breath: Secondary | ICD-10-CM | POA: Diagnosis not present

## 2021-09-19 DIAGNOSIS — Z8249 Family history of ischemic heart disease and other diseases of the circulatory system: Secondary | ICD-10-CM | POA: Diagnosis not present

## 2021-09-19 DIAGNOSIS — G4730 Sleep apnea, unspecified: Secondary | ICD-10-CM | POA: Diagnosis not present

## 2021-09-19 DIAGNOSIS — I081 Rheumatic disorders of both mitral and tricuspid valves: Secondary | ICD-10-CM | POA: Diagnosis not present

## 2021-09-19 DIAGNOSIS — Z794 Long term (current) use of insulin: Secondary | ICD-10-CM | POA: Diagnosis not present

## 2021-09-19 DIAGNOSIS — E119 Type 2 diabetes mellitus without complications: Secondary | ICD-10-CM | POA: Diagnosis not present

## 2021-09-19 DIAGNOSIS — E118 Type 2 diabetes mellitus with unspecified complications: Secondary | ICD-10-CM | POA: Diagnosis not present

## 2021-09-19 DIAGNOSIS — R9439 Abnormal result of other cardiovascular function study: Secondary | ICD-10-CM | POA: Diagnosis not present

## 2021-09-19 DIAGNOSIS — Z885 Allergy status to narcotic agent status: Secondary | ICD-10-CM | POA: Diagnosis not present

## 2021-09-19 DIAGNOSIS — R072 Precordial pain: Secondary | ICD-10-CM | POA: Diagnosis not present

## 2021-09-19 DIAGNOSIS — K219 Gastro-esophageal reflux disease without esophagitis: Secondary | ICD-10-CM | POA: Diagnosis not present

## 2021-09-20 DIAGNOSIS — E785 Hyperlipidemia, unspecified: Secondary | ICD-10-CM | POA: Diagnosis not present

## 2021-09-20 DIAGNOSIS — Z79899 Other long term (current) drug therapy: Secondary | ICD-10-CM | POA: Diagnosis not present

## 2021-09-20 DIAGNOSIS — Z8719 Personal history of other diseases of the digestive system: Secondary | ICD-10-CM | POA: Diagnosis not present

## 2021-09-20 DIAGNOSIS — R079 Chest pain, unspecified: Secondary | ICD-10-CM | POA: Diagnosis not present

## 2021-09-20 DIAGNOSIS — R110 Nausea: Secondary | ICD-10-CM | POA: Diagnosis not present

## 2021-09-20 DIAGNOSIS — I959 Hypotension, unspecified: Secondary | ICD-10-CM | POA: Diagnosis not present

## 2021-09-20 DIAGNOSIS — I10 Essential (primary) hypertension: Secondary | ICD-10-CM | POA: Diagnosis not present

## 2021-09-20 DIAGNOSIS — R0602 Shortness of breath: Secondary | ICD-10-CM | POA: Diagnosis not present

## 2021-09-20 DIAGNOSIS — R072 Precordial pain: Secondary | ICD-10-CM | POA: Diagnosis not present

## 2021-09-20 DIAGNOSIS — Z794 Long term (current) use of insulin: Secondary | ICD-10-CM | POA: Diagnosis not present

## 2021-09-20 DIAGNOSIS — G4733 Obstructive sleep apnea (adult) (pediatric): Secondary | ICD-10-CM | POA: Diagnosis not present

## 2021-09-21 DIAGNOSIS — R0789 Other chest pain: Secondary | ICD-10-CM | POA: Diagnosis not present

## 2021-09-21 DIAGNOSIS — R072 Precordial pain: Secondary | ICD-10-CM | POA: Diagnosis not present

## 2021-09-21 DIAGNOSIS — I4510 Unspecified right bundle-branch block: Secondary | ICD-10-CM | POA: Diagnosis not present

## 2021-09-21 DIAGNOSIS — E119 Type 2 diabetes mellitus without complications: Secondary | ICD-10-CM | POA: Diagnosis not present

## 2021-09-21 DIAGNOSIS — I10 Essential (primary) hypertension: Secondary | ICD-10-CM | POA: Diagnosis not present

## 2021-09-21 DIAGNOSIS — G4730 Sleep apnea, unspecified: Secondary | ICD-10-CM | POA: Diagnosis not present

## 2021-09-21 DIAGNOSIS — G4733 Obstructive sleep apnea (adult) (pediatric): Secondary | ICD-10-CM | POA: Diagnosis not present

## 2021-09-21 DIAGNOSIS — E039 Hypothyroidism, unspecified: Secondary | ICD-10-CM | POA: Diagnosis not present

## 2021-09-21 DIAGNOSIS — K219 Gastro-esophageal reflux disease without esophagitis: Secondary | ICD-10-CM | POA: Diagnosis not present

## 2021-09-21 DIAGNOSIS — I517 Cardiomegaly: Secondary | ICD-10-CM | POA: Diagnosis not present

## 2021-09-21 DIAGNOSIS — E785 Hyperlipidemia, unspecified: Secondary | ICD-10-CM | POA: Diagnosis not present

## 2021-09-29 DIAGNOSIS — I209 Angina pectoris, unspecified: Secondary | ICD-10-CM | POA: Diagnosis not present

## 2021-11-22 DIAGNOSIS — I2510 Atherosclerotic heart disease of native coronary artery without angina pectoris: Secondary | ICD-10-CM | POA: Diagnosis not present

## 2021-11-22 DIAGNOSIS — G4733 Obstructive sleep apnea (adult) (pediatric): Secondary | ICD-10-CM | POA: Diagnosis not present

## 2021-11-22 DIAGNOSIS — E782 Mixed hyperlipidemia: Secondary | ICD-10-CM | POA: Diagnosis not present

## 2021-11-22 DIAGNOSIS — I10 Essential (primary) hypertension: Secondary | ICD-10-CM | POA: Diagnosis not present

## 2022-01-17 DIAGNOSIS — K219 Gastro-esophageal reflux disease without esophagitis: Secondary | ICD-10-CM | POA: Diagnosis not present

## 2022-01-17 DIAGNOSIS — Z0000 Encounter for general adult medical examination without abnormal findings: Secondary | ICD-10-CM | POA: Diagnosis not present

## 2022-01-17 DIAGNOSIS — E1169 Type 2 diabetes mellitus with other specified complication: Secondary | ICD-10-CM | POA: Diagnosis not present

## 2022-01-17 DIAGNOSIS — E7849 Other hyperlipidemia: Secondary | ICD-10-CM | POA: Diagnosis not present

## 2022-01-17 DIAGNOSIS — I10 Essential (primary) hypertension: Secondary | ICD-10-CM | POA: Diagnosis not present

## 2022-01-17 DIAGNOSIS — E1165 Type 2 diabetes mellitus with hyperglycemia: Secondary | ICD-10-CM | POA: Diagnosis not present

## 2022-01-17 DIAGNOSIS — E038 Other specified hypothyroidism: Secondary | ICD-10-CM | POA: Diagnosis not present

## 2022-01-17 DIAGNOSIS — I209 Angina pectoris, unspecified: Secondary | ICD-10-CM | POA: Diagnosis not present

## 2022-02-02 DIAGNOSIS — R1084 Generalized abdominal pain: Secondary | ICD-10-CM | POA: Diagnosis not present

## 2022-02-08 DIAGNOSIS — K219 Gastro-esophageal reflux disease without esophagitis: Secondary | ICD-10-CM | POA: Diagnosis not present

## 2022-02-08 DIAGNOSIS — K449 Diaphragmatic hernia without obstruction or gangrene: Secondary | ICD-10-CM | POA: Diagnosis not present

## 2022-04-27 DIAGNOSIS — K921 Melena: Secondary | ICD-10-CM | POA: Diagnosis not present

## 2022-05-01 ENCOUNTER — Other Ambulatory Visit: Payer: Self-pay | Admitting: *Deleted

## 2022-05-01 NOTE — Patient Outreach (Signed)
  Care Coordination   05/01/2022 Name: Yesenia Wilson MRN: [id number] DOB: 10-21-1955   Care Coordination Outreach Attempts:  An unsuccessful telephone outreach was attempted today to offer the patient information about available care coordination services as a benefit of their health plan.   Follow Up Plan:  Additional outreach attempts will be made to offer the patient care coordination information and services.   Encounter Outcome:  No Answer  Care Coordination Interventions Activated:  No   Care Coordination Interventions:  No, not indicated    Valente David, RN, MSN, [facility] Care Coordinator [phone]

## 2022-05-05 ENCOUNTER — Other Ambulatory Visit: Payer: Self-pay | Admitting: *Deleted

## 2022-05-05 NOTE — Patient Outreach (Signed)
  Care Coordination   05/05/2022 Name: Yesenia Wilson MRN: [id number] DOB: 1956/05/31   Care Coordination Outreach Attempts:  A second unsuccessful outreach was attempted today to offer the patient with information about available care coordination services as a benefit of their health plan.     Follow Up Plan:  Additional outreach attempts will be made to offer the patient care coordination information and services.   Encounter Outcome:  No Answer  Care Coordination Interventions Activated:  No   Care Coordination Interventions:  No, not indicated    Valente David, RN, MSN, [facility] Care Coordinator [phone]

## 2022-05-09 ENCOUNTER — Other Ambulatory Visit: Payer: Self-pay | Admitting: *Deleted

## 2022-05-09 NOTE — Patient Outreach (Signed)
  Care Coordination   05/09/2022 Name: Yesenia Wilson MRN: [id number] DOB: 1956/05/03   Care Coordination Outreach Attempts:  A third unsuccessful outreach was attempted today to offer the patient with information about available care coordination services as a benefit of their health plan.   Follow Up Plan:  No further outreach attempts will be made at this time. We have been unable to contact the patient to offer or enroll patient in care coordination services  Encounter Outcome:  No Answer  Care Coordination Interventions Activated:  No   Care Coordination Interventions:  No, not indicated    Valente David, RN, MSN, [facility] Care Coordinator [phone]

## 2022-06-12 ENCOUNTER — Ambulatory Visit: Payer: Medicare Other | Admitting: Adult Health

## 2022-06-21 ENCOUNTER — Encounter (INDEPENDENT_AMBULATORY_CARE_PROVIDER_SITE_OTHER): Payer: Self-pay | Admitting: Ophthalmology

## 2022-06-21 ENCOUNTER — Ambulatory Visit (INDEPENDENT_AMBULATORY_CARE_PROVIDER_SITE_OTHER): Payer: Medicare Other | Admitting: Ophthalmology

## 2022-06-21 DIAGNOSIS — H3581 Retinal edema: Secondary | ICD-10-CM

## 2022-06-21 DIAGNOSIS — H25813 Combined forms of age-related cataract, bilateral: Secondary | ICD-10-CM

## 2022-06-21 DIAGNOSIS — I10 Essential (primary) hypertension: Secondary | ICD-10-CM

## 2022-06-21 DIAGNOSIS — E119 Type 2 diabetes mellitus without complications: Secondary | ICD-10-CM

## 2022-06-21 DIAGNOSIS — H35371 Puckering of macula, right eye: Secondary | ICD-10-CM

## 2022-06-21 DIAGNOSIS — H35033 Hypertensive retinopathy, bilateral: Secondary | ICD-10-CM | POA: Diagnosis not present

## 2022-06-21 NOTE — Progress Notes (Signed)
[facility] - Clinic Note  06/21/2022     CHIEF COMPLAINT Patient presents for Retina Evaluation   HISTORY OF PRESENT ILLNESS: Yesenia Wilson is a 66 y.o. female who presents to the clinic today for:   HPI     Retina Evaluation   In right eye.  This started 1 month ago.  Duration of 3 days.  I, the attending physician,  performed the HPI with the patient and updated documentation appropriately.        Comments   Pt here for new pt ret eval for severe retinopathy OD/CMSE. Pt states on Sunday she noticed she could no longer see the hymnals at church and felt OU were strained. Pt is diabetic, reports diagnosed 8 years ago. Pts most recent A1C was 13 1 month ago. BS was 119 this morning, pt states shes revised her diet as of yesterday. Pt has been under immense family stress. Pt diagnosed some years ago w/ Lupus (1984), not currently on treatment. Pt wears rx specs.       Last edited by Rennis Chris, MD on 06/23/2022 12:22 AM.     Patient states she has been diabetic for 8 years. Most recent A1c was 13.1. Patient is attempting to revise diet and has been under tremendous family stress recently. Noticed trouble reading in church on Sunday. BS was 112 this am, but was up above 300 when patient last had vision checked. Can see more clearly today. Patient was diagnosed with lupus in 1984. History of plaquenil and methotrexate 10 years ago. Has not had to have any treatments for lupus in recent years.   Referring physician: Michaelle Copas, MD [phone] [facility] [address],  [place] [address]  HISTORICAL INFORMATION:   Selected notes from the MEDICAL RECORD NUMBER Referred by Dr. Conley Rolls LEE:  Ocular Hx- PMH-    CURRENT MEDICATIONS: No current outpatient medications on file. (Ophthalmic Drugs)   No current facility-administered medications for this visit. (Ophthalmic Drugs)   Current Outpatient Medications (Other)  Medication Sig   BD PEN NEEDLE NANO U/F 32G X 4 MM MISC USE  3 PER TO DAY TO INJECT NOVOLOG   Collagen-Vitamin C-Biotin (COLLAGEN 1500/C PO) Take by mouth.   DULoxetine (CYMBALTA) 60 MG capsule TAKE 1 CAPSULE (60 MG TOTAL) BY MOUTH DAILY.   hydrochlorothiazide (MICROZIDE) 12.5 MG capsule Take 12.5 mg by mouth daily.   Insulin Degludec 100 UNIT/ML SOLN Inject 40 Units into the skin at bedtime.    Insulin Pen Needle (BD PEN NEEDLE NANO U/F) 32G X 4 MM MISC Use as directed to inject insulin 4 times daily.   insulin regular human CONCENTRATED (HUMULIN R) 500 UNIT/ML injection Fill insulin pump with 2 mL every 3 days (Patient taking differently: Inject 50 Units into the skin in the morning and at bedtime.)   JARDIANCE 10 MG TABS tablet Take 20 mg by mouth daily.   levothyroxine (SYNTHROID, LEVOTHROID) 200 MCG tablet Take 200 mcg by mouth daily.   Moringa Oleifera (MORINGA PO) Take by mouth.   Multiple Vitamins-Minerals (MULTIVITAMIN WOMEN 50+ PO) Take 1 tablet by mouth daily.   ONETOUCH DELICA LANCETS 33G MISC USE TO CHECK BLOOD SUGAR 3 TIMES PER DAY. DX CODE E11.9   ONETOUCH VERIO test strip USE TO TEST BLOOD SUGAR 3 TIMES A DAY DX CODE E11.9   pantoprazole (PROTONIX) 40 MG tablet TAKE 1 TABLET BY MOUTH TWICE A DAY (Patient taking differently: Take 40 mg by mouth daily.)  verapamil (VERELAN PM) 240 MG 24 hr capsule Take 240 mg by mouth at bedtime.   fluconazole (DIFLUCAN) 150 MG tablet Take 1 now and 1 in 3 days (Patient not taking: Reported on 06/21/2022)   Grape Seed 100 MG CAPS Take by mouth. (Patient not taking: Reported on 06/21/2022)   Resveratrol 100 MG CAPS Take by mouth. (Patient not taking: Reported on 06/21/2022)   Selenium 200 MCG CAPS Take 200 mcg by mouth daily. (Patient not taking: Reported on 06/21/2022)   TURMERIC CURCUMIN PO Take by mouth. (Patient not taking: Reported on 06/21/2022)   valACYclovir (VALTREX) 1000 MG tablet TAKE 1 TABLET BY MOUTH TWICE A DAY   VITAMIN D, CHOLECALCIFEROL, PO Take by mouth. (Patient not taking: Reported on  06/21/2022)   Zinc Sulfate (ZINC 15 PO) Take by mouth. (Patient not taking: Reported on 06/21/2022)   No current facility-administered medications for this visit. (Other)   REVIEW OF SYSTEMS: ROS   Positive for: Endocrine, Cardiovascular, Eyes Negative for: Constitutional, Gastrointestinal, Neurological, Skin, Genitourinary, Musculoskeletal, HENT, Respiratory, Psychiatric, Allergic/Imm, Heme/Lymph Last edited by Kingsley Spittle, COT on 06/21/2022  9:07 AM.     ALLERGIES Allergies  Allergen Reactions   Other Other (See Comments)    Packing used during nose surgery : Shock   Oxycodone-Acetaminophen     REACTION: GI upset   Propoxyphene N-Acetaminophen     REACTION: nausea, rapid heartrate   PAST MEDICAL HISTORY Past Medical History:  Diagnosis Date   Anxiety    Asthma    Clotting disorder ([place])    DVT left leg between ankle and knee due to tennis injury   COPD (chronic obstructive pulmonary disease) ([place])    Depression    Diabetes mellitus    DVT (deep venous thrombosis) ([facility])    left leg   Fibromyalgia    GERD (gastroesophageal reflux disease)    Heart murmur    Hiatal hernia    Hyperlipidemia    Hypertension    Hypothyroidism    Lupus ([place])    Neuromuscular disorder ([place])    fibromyalgia   Pancreatitis    Pneumonia    Past Surgical History:  Procedure Laterality Date   ABDOMINAL HYSTERECTOMY     APPENDECTOMY     bladder tack     CESAREAN SECTION     CHOLECYSTECTOMY     COLONOSCOPY     12 yrs ago   LEFT HEART CATH AND CORONARY ANGIOGRAPHY N/A 11/22/2016   Procedure: Left Heart Cath and Coronary Angiography;  Surgeon: Burnell Blanks, MD;  Location: [facility];  Service: Cardiovascular;  Laterality: N/A;   SEPTOPLASTY     FAMILY HISTORY Family History  Adopted: Yes  Problem Relation Age of Onset   Diabetes Mellitus II Mother    Colon polyps Mother    Peptic Ulcer Mother    CAD Father    Diabetes Mellitus II Father    Diabetes Sister     CAD Sister    Diabetes Mellitus II Sister    CAD Brother    Diabetes Mellitus II Brother    Breast cancer Maternal Grandmother    Stomach cancer Maternal Grandmother    Colon cancer Maternal Grandmother    Diabetes Paternal Grandmother    Diabetes Daughter    Diabetes Son    Cirrhosis Cousin        x 2   Esophageal cancer Neg Hx    Rectal cancer Neg Hx     SOCIAL HISTORY Social History  Tobacco Use   Smoking status: Never   Smokeless tobacco: Never  Vaping Use   Vaping Use: Never used  Substance Use Topics   Alcohol use: Yes    Comment: Once every 2-3 months    Drug use: No       OPHTHALMIC EXAM:  Base Eye Exam     Visual Acuity (Snellen - Linear)       Right Left   Dist cc 20/100 20/50 +1   Dist ph cc 20/50 +2 20/30    Correction: Glasses  Rx specs 66 years old        Tonometry (Tonopen, 9:23 AM)       Right Left   Pressure 19 21         Pupils       Dark Light Shape React APD   Right 3 2 Round Brisk 0   Left 3 2 Round Brisk +0.5         Visual Fields (Counting fingers)       Left Right    Full Full         Extraocular Movement       Right Left    Full, Ortho Full, Ortho         Neuro/Psych     Oriented x3: Yes   Mood/Affect: Normal         Dilation     Both eyes: 1.0% Mydriacyl, 2.5% Phenylephrine @ 9:28 AM           Slit Lamp and Fundus Exam     Slit Lamp Exam       Right Left   Lids/Lashes dermactochalasis dermactochalasis, mild MGD   Conjunctiva/Sclera white and quiet white and quiet   Cornea mild tear film debris mild tear film debris   Anterior Chamber deep and clear, no cell or flare deep and clear, no cell or flare   Iris no NVI, round and dilated no NVI, round and dilated   Lens 2-3+ NS, 2+ cortical,mild brunescense 2-3+ NS, 2+ cortical, mild brunescense   Anterior Vitreous syneresis syneresis         Fundus Exam       Right Left   Disc pink and sharp, mild tilt pink and sharp, +SVP   C/D  Ratio 0.5 0.3   Macula flat, good foveal reflex, mild ERM, no heme or edema flat, good foveal reflex, mild RPE mottling, no heme or edema   Vessels mild attenuation, mild tortuosity mild attenuation, mild tortuosity   Periphery attached, no heme attached, no heme           Refraction     Manifest Refraction       Sphere Cylinder Axis Dist VA   Right -5.25 +1.00 055 20/40-2   Left -4.75 +0.50 080 20/40           IMAGING AND PROCEDURES  Imaging and Procedures for 06/21/2022  OCT, Retina - OU - Both Eyes       Right Eye Central Foveal Thickness: 277. Progression has no prior data. Findings include normal foveal contour, no IRF, no SRF, epiretinal membrane (Mild ERM with foveal notch; partial PVD).   Left Eye Central Foveal Thickness: 288. Progression has no prior data. Findings include normal foveal contour, no IRF, no SRF, vitreomacular adhesion .   Notes *Images captured and stored on drive  Diagnosis / Impression:  No DME OU OD: mild ERM with foveal notch; PVD OS: NFP, no IRF/SRF  Clinical management:  See below  Abbreviations: NFP - Normal foveal profile. CME - cystoid macular edema. PED - pigment epithelial detachment. IRF - intraretinal fluid. SRF - subretinal fluid. EZ - ellipsoid zone. ERM - epiretinal membrane. ORA - outer retinal atrophy. ORT - outer retinal tubulation. SRHM - subretinal hyper-reflective material. IRHM - intraretinal hyper-reflective material            ASSESSMENT/PLAN:    ICD-10-CM   1. Diabetes mellitus type 2 without retinopathy ([place])  E11.9 OCT, Retina - OU - Both Eyes    2. Epiretinal membrane (ERM) of right eye  H35.371 OCT, Retina - OU - Both Eyes    3. Essential hypertension  I10     4. Hypertensive retinopathy of both eyes  H35.033     5. Combined forms of age-related cataract of both eyes  H25.813       1. Diabetes mellitus, type 2 without retinopathy - The incidence, risk factors for progression, natural history  and treatment options for diabetic retinopathy  were discussed with patient.   - The need for close monitoring of blood glucose, blood pressure, and serum lipids, avoiding cigarette or any type of tobacco, and the need for long term follow up was also discussed with patient. - patient had visual acuity of 20/.400 OD yesterday at Dr. Gus Puma visit, but BG was elevated to >300 yesterday per pt report - BCVA 20/40 OU here today, w/ BG measured at 112 this morning.  - dilated exam without heme or edema -- no retinopathy - Transient vision loss likely related to elevated glucose levels - discussed findings - pt is cleared from a retina standpoint for release to Dr. Marin Comment and resumption of primary eye care   2. Epiretinal membrane with foveal notch, right eye  - The natural history, anatomy, potential for loss of vision, and treatment options including vitrectomy techniques and the complications of endophthalmitis, retinal detachment, vitreous hemorrhage, cataract progression and permanent vision loss discussed with the patient. - mild ERM w/ small foveal notch - asymptomatic, no metamorphopsia - no indication for surgery at this time - monitor - f/u prn  3,4. Hypertensive retinopathy OU - discussed importance of tight BP control - monitor  5. Age related cataracts OU - The symptoms of cataract, surgical options, and treatments and risks were discussed with patient. - discussed diagnosis and progression - approaching visually significance - [facility] for cataract [facility]  Ophthalmic Meds Ordered this visit:  No orders of the defined types were placed in this encounter.    Return if symptoms worsen or fail to improve.  There are no Patient Instructions on file for this visit.   Explained the diagnoses, plan, and follow up with the patient and they expressed understanding.  Patient expressed understanding of the importance of proper follow up care.   This document serves as a  record of services personally performed by Gardiner Sleeper, MD, PhD. It was created on their behalf by Roselee Nova, COMT. The creation of this record is the provider's dictation and/or activities during the visit.  Electronically signed by: Roselee Nova, COMT 06/23/22 12:31 AM   Gardiner Sleeper, M.D., Ph.D. Diseases & Surgery of the Retina and Vitreous [facility]  I have reviewed the above documentation for accuracy and completeness, and I agree with the above. Gardiner Sleeper, M.D., Ph.D. 06/23/22 12:42 AM  Abbreviations: M myopia (nearsighted); A astigmatism; H hyperopia (farsighted); P presbyopia; Mrx spectacle prescription;  CTL  contact lenses; OD right eye; OS left eye; OU both eyes  XT exotropia; ET esotropia; PEK punctate epithelial keratitis; PEE punctate epithelial erosions; DES dry eye syndrome; MGD meibomian gland dysfunction; ATs artificial tears; PFAT's preservative free artificial tears; [place] nuclear sclerotic cataract; PSC posterior subcapsular cataract; ERM epi-retinal membrane; PVD posterior vitreous detachment; RD retinal detachment; DM diabetes mellitus; DR diabetic retinopathy; NPDR non-proliferative diabetic retinopathy; PDR proliferative diabetic retinopathy; CSME clinically significant macular edema; DME diabetic macular edema; dbh dot blot hemorrhages; CWS cotton wool spot; POAG primary open angle glaucoma; C/D cup-to-disc ratio; HVF humphrey visual field; GVF goldmann visual field; OCT optical coherence tomography; IOP intraocular pressure; BRVO Branch retinal vein occlusion; CRVO central retinal vein occlusion; CRAO central retinal artery occlusion; BRAO branch retinal artery occlusion; RT retinal tear; SB scleral buckle; PPV pars plana vitrectomy; VH Vitreous hemorrhage; PRP panretinal laser photocoagulation; IVK intravitreal kenalog; VMT vitreomacular traction; MH Macular hole;  NVD neovascularization of the disc; NVE neovascularization elsewhere; AREDS  age related eye disease study; ARMD age related macular degeneration; POAG primary open angle glaucoma; EBMD epithelial/anterior basement membrane dystrophy; ACIOL anterior chamber intraocular lens; IOL intraocular lens; PCIOL posterior chamber intraocular lens; Phaco/IOL phacoemulsification with intraocular lens placement; [place] photorefractive keratectomy; LASIK laser assisted in situ keratomileusis; HTN hypertension; DM diabetes mellitus; COPD chronic obstructive pulmonary disease

## 2022-06-23 ENCOUNTER — Encounter (INDEPENDENT_AMBULATORY_CARE_PROVIDER_SITE_OTHER): Payer: Self-pay | Admitting: Ophthalmology

## 2022-07-18 DIAGNOSIS — E782 Mixed hyperlipidemia: Secondary | ICD-10-CM | POA: Diagnosis not present

## 2022-07-18 DIAGNOSIS — E038 Other specified hypothyroidism: Secondary | ICD-10-CM | POA: Diagnosis not present

## 2022-07-18 DIAGNOSIS — Z0000 Encounter for general adult medical examination without abnormal findings: Secondary | ICD-10-CM | POA: Diagnosis not present

## 2022-07-18 DIAGNOSIS — I10 Essential (primary) hypertension: Secondary | ICD-10-CM | POA: Diagnosis not present

## 2022-07-18 DIAGNOSIS — K219 Gastro-esophageal reflux disease without esophagitis: Secondary | ICD-10-CM | POA: Diagnosis not present

## 2022-07-18 DIAGNOSIS — E1169 Type 2 diabetes mellitus with other specified complication: Secondary | ICD-10-CM | POA: Diagnosis not present

## 2022-07-19 DIAGNOSIS — K5900 Constipation, unspecified: Secondary | ICD-10-CM | POA: Diagnosis not present

## 2022-07-19 DIAGNOSIS — K625 Hemorrhage of anus and rectum: Secondary | ICD-10-CM | POA: Diagnosis not present

## 2022-08-05 ENCOUNTER — Emergency Department (HOSPITAL_COMMUNITY): Payer: Medicare Other

## 2022-08-05 ENCOUNTER — Emergency Department (HOSPITAL_COMMUNITY)
Admission: EM | Admit: 2022-08-05 | Discharge: 2022-08-05 | Disposition: A | Discharge status: Home / Self Care | Payer: Medicare Other | Attending: Emergency Medicine | Admitting: Emergency Medicine

## 2022-08-05 ENCOUNTER — Encounter (HOSPITAL_COMMUNITY): Payer: Self-pay

## 2022-08-05 ENCOUNTER — Other Ambulatory Visit: Payer: Self-pay

## 2022-08-05 DIAGNOSIS — J449 Chronic obstructive pulmonary disease, unspecified: Secondary | ICD-10-CM | POA: Diagnosis not present

## 2022-08-05 DIAGNOSIS — R52 Pain, unspecified: Secondary | ICD-10-CM | POA: Diagnosis not present

## 2022-08-05 DIAGNOSIS — J45909 Unspecified asthma, uncomplicated: Secondary | ICD-10-CM | POA: Insufficient documentation

## 2022-08-05 DIAGNOSIS — E119 Type 2 diabetes mellitus without complications: Secondary | ICD-10-CM | POA: Diagnosis not present

## 2022-08-05 DIAGNOSIS — E039 Hypothyroidism, unspecified: Secondary | ICD-10-CM | POA: Diagnosis not present

## 2022-08-05 DIAGNOSIS — R1084 Generalized abdominal pain: Secondary | ICD-10-CM | POA: Diagnosis not present

## 2022-08-05 DIAGNOSIS — R109 Unspecified abdominal pain: Secondary | ICD-10-CM | POA: Insufficient documentation

## 2022-08-05 DIAGNOSIS — N2889 Other specified disorders of kidney and ureter: Secondary | ICD-10-CM | POA: Diagnosis not present

## 2022-08-05 DIAGNOSIS — R112 Nausea with vomiting, unspecified: Secondary | ICD-10-CM | POA: Diagnosis not present

## 2022-08-05 DIAGNOSIS — R1032 Left lower quadrant pain: Secondary | ICD-10-CM | POA: Diagnosis not present

## 2022-08-05 DIAGNOSIS — Z955 Presence of coronary angioplasty implant and graft: Secondary | ICD-10-CM | POA: Diagnosis not present

## 2022-08-05 DIAGNOSIS — R110 Nausea: Secondary | ICD-10-CM | POA: Diagnosis not present

## 2022-08-05 DIAGNOSIS — I10 Essential (primary) hypertension: Secondary | ICD-10-CM | POA: Insufficient documentation

## 2022-08-05 DIAGNOSIS — T68XXXA Hypothermia, initial encounter: Secondary | ICD-10-CM | POA: Diagnosis not present

## 2022-08-05 DIAGNOSIS — I959 Hypotension, unspecified: Secondary | ICD-10-CM | POA: Diagnosis not present

## 2022-08-05 LAB — URINALYSIS, ROUTINE W REFLEX MICROSCOPIC
Bilirubin Urine: NEGATIVE
Glucose, UA: >=500 mg/dL — AB
Ketones, ur: NEGATIVE mg/dL
Leukocytes,Ua: NEGATIVE
Nitrite: NEGATIVE
Protein, ur: NEGATIVE mg/dL
Specific Gravity, Urine: 1.023 (ref 1.005–1.030)
pH: 5 (ref 5.0–8.0)

## 2022-08-05 LAB — COMPREHENSIVE METABOLIC PANEL
ALT: 15 U/L (ref 0–44)
AST: 18 U/L (ref 15–41)
Albumin: 3.5 g/dL (ref 3.5–5.0)
Alkaline Phosphatase: 116 U/L (ref 38–126)
Anion gap: 11 (ref 5–15)
BUN: 36 mg/dL — ABNORMAL HIGH (ref 8–23)
CO2: 25 mmol/L (ref 22–32)
Calcium: 8.4 mg/dL — ABNORMAL LOW (ref 8.9–10.3)
Chloride: 102 mmol/L (ref 98–111)
Creatinine, Ser: 0.94 mg/dL (ref 0.44–1.00)
GFR, Estimated: >60 mL/min (ref >60)
Glucose, Bld: 304 mg/dL — ABNORMAL HIGH (ref 70–99)
Potassium: 3 mmol/L — ABNORMAL LOW (ref 3.5–5.1)
Sodium: 138 mmol/L (ref 135–145)
Total Bilirubin: 0.7 mg/dL (ref 0.3–1.2)
Total Protein: 6.9 g/dL (ref 6.5–8.1)

## 2022-08-05 LAB — CBC
HCT: 42 % (ref 36.0–46.0)
Hemoglobin: 14.3 g/dL (ref 12.0–15.0)
MCH: 29.7 pg (ref 26.0–34.0)
MCHC: 34 g/dL (ref 30.0–36.0)
MCV: 87.1 fL (ref 80.0–100.0)
Platelets: 263 10*3/uL (ref 150–400)
RBC: 4.82 MIL/uL (ref 3.87–5.11)
RDW: 13 % (ref 11.5–15.5)
WBC: 23.7 10*3/uL — ABNORMAL HIGH (ref 4.0–10.5)
nRBC: 0 % (ref 0.0–0.2)

## 2022-08-05 LAB — LIPASE, BLOOD: Lipase: 31 U/L (ref 11–51)

## 2022-08-05 MED ORDER — SODIUM CHLORIDE 0.9 % IV BOLUS
1000.0000 mL | Freq: Once | INTRAVENOUS | Status: AC
  Administered 2022-08-05: 1000 mL via INTRAVENOUS

## 2022-08-05 MED ORDER — HYDROMORPHONE HCL 1 MG/ML IJ SOLN
1.0000 mg | Freq: Once | INTRAMUSCULAR | Status: AC
  Administered 2022-08-05: 1 mg via INTRAVENOUS
  Filled 2022-08-05: qty 1

## 2022-08-05 MED ORDER — ONDANSETRON 4 MG PO TBDP: 4.0000 mg | ORAL_TABLET | Freq: Three times a day (TID) | ORAL | 0 refills | Status: DC | PRN

## 2022-08-05 MED ORDER — IOHEXOL 300 MG/ML  SOLN
100.0000 mL | Freq: Once | INTRAMUSCULAR | Status: AC | PRN
  Administered 2022-08-05: 100 mL via INTRAVENOUS

## 2022-08-05 MED ORDER — POLYETHYLENE GLYCOL 3350 17 G PO PACK: 17.0000 g | PACK | Freq: Every day | ORAL | 1 refills | Status: DC

## 2022-08-05 MED ORDER — POTASSIUM CHLORIDE 20 MEQ PO PACK
40.0000 meq | PACK | Freq: Once | ORAL | Status: AC
  Administered 2022-08-05: 40 meq via ORAL
  Filled 2022-08-05: qty 2

## 2022-08-05 NOTE — ED Notes (Signed)
Labs being drawn now

## 2022-08-05 NOTE — ED Notes (Signed)
Pt given ice chips per request

## 2022-08-05 NOTE — ED Triage Notes (Addendum)
Pt presents to ED from home with c/o LLQ pain and emesis that started a couple of hours ago, pt reports having colon cleanse for impacted bowel 2 weeks ago, has been taking Doculax and enema last week, now pt says she has loose stools, vomiting, and abd pain is worse. Pt had 4 mg IV Zofran and 500 ml NS in route with improvement of nausea and vomiting.

## 2022-08-05 NOTE — ED Provider Notes (Signed)
[facility] Emergency Department Provider Note MRN:  [id number]  Arrival date & time: 08/05/22     Chief Complaint   Emesis   History of Present Illness   Yesenia Wilson is a 66 y.o. year-old female with a history of DVT, DM, lupus presenting to the ED with chief complaint of emesis.  3 weeks of persistent nausea, vomiting, and abdominal pain.  Began in the epigastrium but today the pain is severe and more diffuse, spreading to the LLQ and flank.  No fever, no diarrhea.  No chest pain or SOB.  Review of Systems  A thorough review of systems was obtained and all systems are negative except as noted in the HPI and PMH.   Patient's Health History    Past Medical History:  Diagnosis Date   Anxiety    Asthma    Clotting disorder ([place])    DVT left leg between ankle and knee due to tennis injury   COPD (chronic obstructive pulmonary disease) ([facility])    Depression    Diabetes mellitus    DVT (deep venous thrombosis) ([facility])    left leg   Fibromyalgia    GERD (gastroesophageal reflux disease)    Heart murmur    Hiatal hernia    Hyperlipidemia    Hypertension    Hypothyroidism    Lupus ([facility])    Neuromuscular disorder ([facility])    fibromyalgia   Pancreatitis    Pneumonia     Past Surgical History:  Procedure Laterality Date   ABDOMINAL HYSTERECTOMY     APPENDECTOMY     bladder tack     CESAREAN SECTION     CHOLECYSTECTOMY     COLONOSCOPY     12 yrs ago   LEFT HEART CATH AND CORONARY ANGIOGRAPHY N/A 11/22/2016   Procedure: Left Heart Cath and Coronary Angiography;  Surgeon: Burnell Blanks, MD;  Location: [facility];  Service: Cardiovascular;  Laterality: N/A;   SEPTOPLASTY      Family History  Adopted: Yes  Problem Relation Age of Onset   Diabetes Mellitus II Mother    Colon polyps Mother    Peptic Ulcer Mother    CAD Father    Diabetes Mellitus II Father    Diabetes Sister    CAD Sister    Diabetes Mellitus II Sister    CAD  Brother    Diabetes Mellitus II Brother    Breast cancer Maternal Grandmother    Stomach cancer Maternal Grandmother    Colon cancer Maternal Grandmother    Diabetes Paternal Grandmother    Diabetes Daughter    Diabetes Son    Cirrhosis Cousin        x 2   Esophageal cancer Neg Hx    Rectal cancer Neg Hx     Social History   Socioeconomic History   Marital status: Single    Spouse name: Not on file   Number of children: 2   Years of education: Not on file   Highest education level: Not on file  Occupational History   Not on file  Tobacco Use   Smoking status: Never   Smokeless tobacco: Never  Vaping Use   Vaping Use: Never used  Substance and Sexual Activity   Alcohol use: Yes    Comment: Once every 2-3 months    Drug use: No   Sexual activity: Yes    Birth control/protection: Surgical    Comment: hyst  Other Topics Concern   Not  on file  Social History Narrative   Not on file   Social Determinants of Health   Financial Resource Strain: Not on file  Food Insecurity: Not on file  Transportation Needs: Not on file  Physical Activity: Not on file  Stress: Not on file  Social Connections: Not on file  Intimate Partner Violence: Not on file     Physical Exam   Vitals:   08/05/22 0315 08/05/22 0430  BP: (!) 141/85 132/75  Pulse: 100 (!) 101  Resp: (!) 24 (!) 26  Temp:    SpO2: 97% 93%    CONSTITUTIONAL:  chronically ill-appearing, NAD NEURO/PSYCH:  Alert and oriented x 3, no focal deficits EYES:  eyes equal and reactive ENT/NECK:  no LAD, no JVD CARDIO:  regular rate, well-perfused, normal S1 and S2 PULM:  CTAB no wheezing or rhonchi GI/GU:  non-distended, moderate diffuse tenderness MSK/SPINE:  No gross deformities, no edema SKIN:  no rash, atraumatic   *Additional and/or pertinent findings included in MDM below  Diagnostic and Interventional Summary    EKG Interpretation  Date/Time:    Ventricular Rate:    PR Interval:    QRS Duration:    QT Interval:    QTC Calculation:   R Axis:     Text Interpretation:         Labs Reviewed  CBC - Abnormal; Notable for the following components:      Result Value   WBC 23.7 (*)    All other components within normal limits  COMPREHENSIVE METABOLIC PANEL - Abnormal; Notable for the following components:   Potassium 3.0 (*)    Glucose, Bld 304 (*)    BUN 36 (*)    Calcium 8.4 (*)    All other components within normal limits  URINALYSIS, ROUTINE W REFLEX MICROSCOPIC - Abnormal; Notable for the following components:   Color, Urine STRAW (*)    Glucose, UA >=500 (*)    Hgb urine dipstick SMALL (*)    Bacteria, UA RARE (*)    All other components within normal limits  LIPASE, BLOOD    CT ABDOMEN PELVIS W CONTRAST  Final Result      Medications  potassium chloride (KLOR-CON) packet 40 mEq (has no administration in time range)  sodium chloride 0.9 % bolus 1,000 mL (0 mLs Intravenous Stopped 08/05/22 0437)  HYDROmorphone (DILAUDID) injection 1 mg (1 mg Intravenous Given 08/05/22 0314)  iohexol (OMNIPAQUE) 300 MG/ML solution 100 mL (100 mLs Intravenous Contrast Given 08/05/22 0503)     Procedures  /  Critical Care Procedures  ED Course and Medical Decision Making  Initial Impression and Ddx Ddx includes gastritic, pancreatitis (h/o same), cholecystitis or colic, diverticulitis, kidney stone, perforated viscus.  Past medical/surgical history that increases complexity of ED encounter: None  Interpretation of Diagnostics I personally reviewed the laboratory assessment and my interpretation is as follows: Marked leukocytosis, mild hypokalemia, otherwise no significant blood count or electrolyte disturbance  CT is revealing large stool burden but otherwise no emergent process.  Patient Reassessment and Ultimate Disposition/Management     Patient feeling a lot better on reassessment, normal vital signs, complaining mostly of just abdominal distention at this time.  Symptoms  do seem explained by constipation.  Will provide further stool regimen at home.  Patient management required discussion with the following services or consulting groups:  None  Complexity of Problems Addressed Acute illness or injury that poses threat of life of bodily function  Additional Data Reviewed and Analyzed Further  history obtained from: None  Additional Factors Impacting ED Encounter Risk Prescriptions  Barth Kirks. [place]onia Small, [place] [email or web address]  Final Clinical Impressions(s) / ED Diagnoses     ICD-10-CM   1. Abdominal pain, unspecified abdominal location  R10.9     2. Nausea and vomiting, unspecified vomiting type  R11.2       ED Discharge Orders          Ordered    polyethylene glycol (MIRALAX) 17 g packet  Daily        08/05/22 0536             Discharge Instructions Discussed with and Provided to Patient:     Discharge Instructions      You were evaluated in the Emergency Department and after careful evaluation, we did not find any emergent condition requiring admission or further testing in the hospital.  Your exam/testing today was overall reassuring.  Symptoms likely due to constipation.  Take the MiraLAX up to 6 times daily as we discussed and continue follow-up with gastroenterology.  Please return to the Emergency Department if you experience any worsening of your condition.  Thank you for allowing [place] to be a part of your care.        Maudie Flakes, MD 08/05/22 [phone]

## 2022-08-05 NOTE — Discharge Instructions (Addendum)
You were evaluated in the Emergency Department and after careful evaluation, we did not find any emergent condition requiring admission or further testing in the hospital.  Your exam/testing today was overall reassuring.  Symptoms likely due to constipation.  Take the MiraLAX up to 6 times daily as we discussed and continue follow-up with gastroenterology.  Please return to the Emergency Department if you experience any worsening of your condition.  Thank you for allowing [place] to be a part of your care.

## 2022-08-21 DIAGNOSIS — E079 Disorder of thyroid, unspecified: Secondary | ICD-10-CM | POA: Diagnosis not present

## 2022-08-21 DIAGNOSIS — Z885 Allergy status to narcotic agent status: Secondary | ICD-10-CM | POA: Diagnosis not present

## 2022-08-21 DIAGNOSIS — R9431 Abnormal electrocardiogram [ECG] [EKG]: Secondary | ICD-10-CM | POA: Diagnosis not present

## 2022-08-21 DIAGNOSIS — J449 Chronic obstructive pulmonary disease, unspecified: Secondary | ICD-10-CM | POA: Diagnosis not present

## 2022-08-21 DIAGNOSIS — R0789 Other chest pain: Secondary | ICD-10-CM | POA: Diagnosis not present

## 2022-08-21 DIAGNOSIS — G4489 Other headache syndrome: Secondary | ICD-10-CM | POA: Diagnosis not present

## 2022-08-21 DIAGNOSIS — E7800 Pure hypercholesterolemia, unspecified: Secondary | ICD-10-CM | POA: Diagnosis not present

## 2022-08-21 DIAGNOSIS — E1165 Type 2 diabetes mellitus with hyperglycemia: Secondary | ICD-10-CM | POA: Diagnosis not present

## 2022-08-21 DIAGNOSIS — R42 Dizziness and giddiness: Secondary | ICD-10-CM | POA: Diagnosis not present

## 2022-08-21 DIAGNOSIS — R079 Chest pain, unspecified: Secondary | ICD-10-CM | POA: Diagnosis not present

## 2022-08-21 DIAGNOSIS — I4510 Unspecified right bundle-branch block: Secondary | ICD-10-CM | POA: Diagnosis not present

## 2022-08-21 DIAGNOSIS — R519 Headache, unspecified: Secondary | ICD-10-CM | POA: Diagnosis not present

## 2022-08-21 DIAGNOSIS — R739 Hyperglycemia, unspecified: Secondary | ICD-10-CM | POA: Diagnosis not present

## 2022-09-28 DIAGNOSIS — E782 Mixed hyperlipidemia: Secondary | ICD-10-CM | POA: Diagnosis not present

## 2022-09-28 DIAGNOSIS — I10 Essential (primary) hypertension: Secondary | ICD-10-CM | POA: Diagnosis not present

## 2022-09-28 DIAGNOSIS — I2510 Atherosclerotic heart disease of native coronary artery without angina pectoris: Secondary | ICD-10-CM | POA: Diagnosis not present

## 2022-09-28 DIAGNOSIS — R0789 Other chest pain: Secondary | ICD-10-CM | POA: Diagnosis not present

## 2022-10-18 DIAGNOSIS — E1169 Type 2 diabetes mellitus with other specified complication: Secondary | ICD-10-CM | POA: Diagnosis not present

## 2022-10-18 DIAGNOSIS — J3089 Other allergic rhinitis: Secondary | ICD-10-CM | POA: Diagnosis not present

## 2022-10-18 DIAGNOSIS — E038 Other specified hypothyroidism: Secondary | ICD-10-CM | POA: Diagnosis not present

## 2022-10-18 DIAGNOSIS — K219 Gastro-esophageal reflux disease without esophagitis: Secondary | ICD-10-CM | POA: Diagnosis not present

## 2022-10-18 DIAGNOSIS — Z0000 Encounter for general adult medical examination without abnormal findings: Secondary | ICD-10-CM | POA: Diagnosis not present

## 2022-11-08 ENCOUNTER — Telehealth: Payer: Self-pay | Admitting: Adult Health

## 2022-11-08 MED ORDER — VALACYCLOVIR HCL 1 G PO TABS: 1000.0000 mg | ORAL_TABLET | Freq: Two times a day (BID) | ORAL | 3 refills | Status: DC

## 2022-11-08 NOTE — Telephone Encounter (Signed)
Refilled valtrex

## 2022-11-08 NOTE — Addendum Note (Signed)
Addended by: Derrek Monaco A on: 11/08/2022 01:08 PM   Modules accepted: Orders

## 2022-11-08 NOTE — Telephone Encounter (Signed)
Patient would like a nurse to call her. She has a question about her condition. She states she really needs a rx of valtrex called in. Her condition has got worse. Please advise.

## 2022-11-13 ENCOUNTER — Other Ambulatory Visit (HOSPITAL_COMMUNITY)
Admission: RE | Admit: 2022-11-13 | Discharge: 2022-11-13 | Disposition: A | Discharge status: Home / Self Care | Payer: 59 | Source: Ambulatory Visit | Attending: Adult Health | Admitting: Adult Health

## 2022-11-13 ENCOUNTER — Ambulatory Visit (INDEPENDENT_AMBULATORY_CARE_PROVIDER_SITE_OTHER): Payer: 59 | Admitting: Adult Health

## 2022-11-13 ENCOUNTER — Encounter: Payer: Self-pay | Admitting: Adult Health

## 2022-11-13 VITALS — BP 134/80 | HR 92 | Ht 63.0 in | Wt 170.5 lb

## 2022-11-13 DIAGNOSIS — B369 Superficial mycosis, unspecified: Secondary | ICD-10-CM

## 2022-11-13 DIAGNOSIS — B009 Herpesviral infection, unspecified: Secondary | ICD-10-CM | POA: Diagnosis not present

## 2022-11-13 DIAGNOSIS — B001 Herpesviral vesicular dermatitis: Secondary | ICD-10-CM | POA: Insufficient documentation

## 2022-11-13 MED ORDER — FLUCONAZOLE 150 MG PO TABS: ORAL_TABLET | ORAL | 2 refills | Status: DC

## 2022-11-13 MED ORDER — IBUPROFEN 800 MG PO TABS: 800.0000 mg | ORAL_TABLET | Freq: Three times a day (TID) | ORAL | 1 refills | Status: DC | PRN

## 2022-11-13 MED ORDER — LIDOCAINE 5 % EX OINT: 1.0000 | TOPICAL_OINTMENT | CUTANEOUS | 0 refills | Status: DC | PRN

## 2022-11-13 NOTE — Progress Notes (Signed)
Subjective:     Patient ID: Yesenia Wilson, female   DOB: Jul 10, 1956, 67 y.o.   MRN: [id number]  HPI Yesenia Wilson is a 67 year old white female, single, sop hysterectomy in complaining of herpes outbreak and cold sore, hurts to sit. She has valtrex but only taking 1 gm daily. She says she just finished antibiotics recently.  PCP is Dr Sherrie Sport  Review of Systems Has herpes hurts to sit Has cold sore too No sex in over a year  Reviewed past medical,surgical, social and family history. Reviewed medications and allergies.     Objective:   Physical Exam BP 134/80 (BP Location: Left Arm, Patient Position: Sitting, Cuff Size: Normal)   Pulse 92   Ht '5\' 3"'$  (1.6 m)   Wt 170 lb 8 oz (77.3 kg)   BMI 30.20 kg/m   Skin warm and dry. Has cold sore in corner of mouth on right, it is dry.Pelvic: external genitalia is red in groin and near clitoral area, has used cortisone cream, has vesicle right buttock. CV swab obtained for BV and yeast. No pelvic exam, she is tender.  Fall risk is moderate  Upstream - 11/13/22 1609       Pregnancy Intention Screening   Does the patient want to become pregnant in the next year? N/A    Does the patient's partner want to become pregnant in the next year? N/A    Would the patient like to discuss contraceptive options today? N/A      Contraception Wrap Up   Current Method Female Sterilization   hyst   End Method Female Sterilization   hyst   Contraception Counseling Provided No               Examination chaperoned by Levy Pupa LPN  Assessment:     1. Herpes Has vesicle right buttock Has pain, hurts to sit, has been taking valtrex 1 daily  Take valtrex 1 gm  2 x daily, has rx Will rx lidocaine ointment Will rx motrin 800 mg 1 every 8 hours prn pain Bath with warm water, no soap and pat dry Can use ice pack prn    Meds ordered this encounter  Medications   fluconazole (DIFLUCAN) 150 MG tablet    Sig: Take 1 now and 1 in 3 days.. do not take  statin when taking    Dispense:  2 tablet    Refill:  2    Order Specific Question:   Supervising Provider    Answer:   Elonda Husky, LUTHER H [[id number]]   lidocaine (XYLOCAINE) 5 % ointment    Sig: Apply 1 Application topically as needed.    Dispense:  35.44 g    Refill:  0    Order Specific Question:   Supervising Provider    Answer:   Elonda Husky, LUTHER H [[id number]]   ibuprofen (ADVIL) 800 MG tablet    Sig: Take 1 tablet (800 mg total) by mouth every 8 (eight) hours as needed.    Dispense:  30 tablet    Refill:  1    Order Specific Question:   Supervising Provider    Answer:   Elonda Husky, LUTHER H [[id number]]    2. Superficial fungus infection of skin Has red irritation in groin and at clitoral area has used cream, just finished antibiotics CV swab obtained for BV and yeast  3. Cold sore Has had cold sore    Take valtrex 1 gm bid  Plan:     Follow up in  1 week for recheck

## 2022-11-15 ENCOUNTER — Other Ambulatory Visit: Payer: Self-pay | Admitting: Adult Health

## 2022-11-15 ENCOUNTER — Telehealth: Payer: Self-pay | Admitting: *Deleted

## 2022-11-15 LAB — CERVICOVAGINAL ANCILLARY ONLY
Bacterial Vaginitis (gardnerella): POSITIVE — AB
Candida Glabrata: POSITIVE — AB
Candida Vaginitis: POSITIVE — AB
Comment: NEGATIVE
Comment: NEGATIVE
Comment: NEGATIVE

## 2022-11-15 MED ORDER — METRONIDAZOLE 500 MG PO TABS: 500.0000 mg | ORAL_TABLET | Freq: Two times a day (BID) | ORAL | 0 refills | Status: DC

## 2022-11-15 NOTE — Progress Notes (Signed)
+  BV on vaginal swab will rx flagyl, no alcohol or sex while taking

## 2022-11-15 NOTE — Telephone Encounter (Signed)
Pt aware BV showed up on swab. Flagyl was sent to pharmacy. No alcohol while taking med. Yeast also showed up. Take Diflucan as directed. Keep appt with Delsa Sale in March. Pt voiced understanding. [name]

## 2022-11-15 NOTE — Telephone Encounter (Signed)
-----   Message from Yesenia Dooms, NP sent at 11/15/2022 11:40 AM EST ----- Can you call her to make sure she knows about taking the flagyl. THX

## 2022-11-21 ENCOUNTER — Ambulatory Visit (INDEPENDENT_AMBULATORY_CARE_PROVIDER_SITE_OTHER): Payer: 59 | Admitting: Adult Health

## 2022-11-21 ENCOUNTER — Encounter: Payer: Self-pay | Admitting: Adult Health

## 2022-11-21 VITALS — BP 155/93 | HR 93 | Ht 63.0 in | Wt 176.0 lb

## 2022-11-21 DIAGNOSIS — B369 Superficial mycosis, unspecified: Secondary | ICD-10-CM

## 2022-11-21 DIAGNOSIS — Z90710 Acquired absence of both cervix and uterus: Secondary | ICD-10-CM | POA: Diagnosis not present

## 2022-11-21 DIAGNOSIS — B009 Herpesviral infection, unspecified: Secondary | ICD-10-CM

## 2022-11-21 DIAGNOSIS — B379 Candidiasis, unspecified: Secondary | ICD-10-CM | POA: Diagnosis not present

## 2022-11-21 NOTE — Progress Notes (Signed)
  Subjective:     Patient ID: Yesenia Wilson, female   DOB: Apr 08, 1956, 67 y.o.   MRN: [id number]  HPI [name] is a 67 year old white female, single, sp hysterectomy back in follow up on having been seen 11/13/22 and treated for herpes and yeast and CV swab showed BV, candida vaginitis and candida glabrata. She was treated for BV on 11/15/22. She has had some nausea with meds  PCP is Dr Sherrie Sport   Review of Systems Still itches some but better Some nausea with meds  Reviewed past medical,surgical, social and family history. Reviewed medications and allergies.     Objective:   Physical Exam BP (!) 155/93 (BP Location: Right Arm, Patient Position: Sitting, Cuff Size: Normal)   Pulse 93   Ht '5\' 3"'$  (1.6 m)   Wt 176 lb (79.8 kg)   BMI 31.18 kg/m     Skin warm and dry.Pelvic: external genitalia is normal in appearance no lesions,no longer red in groin, vagina: white discharge without odor,urethra has no lesions or masses noted, cervix and uterus are absent,adnexa: no masses or tenderness noted. Bladder is non tender and no masses felt. Vesicle right buttock is drying up.   Upstream - 11/21/22 1602       Pregnancy Intention Screening   Does the patient want to become pregnant in the next year? N/A    Does the patient's partner want to become pregnant in the next year? N/A    Would the patient like to discuss contraceptive options today? N/A      Contraception Wrap Up   Current Method Female Sterilization   hyst   End Method Female Sterilization   hyst   Contraception Counseling Provided No             Examination chaperoned by Levy Pupa LPN  Assessment:     1. Yeast infection Still itches at times but better Has refill on diflucan   2. Superficial fungus infection of skin Resolved   3. Herpes Resolving, will finish valtrex   4. S/P hysterectomy     Plan:     Follow up prn

## 2022-11-30 DIAGNOSIS — Z1231 Encounter for screening mammogram for malignant neoplasm of breast: Secondary | ICD-10-CM | POA: Diagnosis not present

## 2022-12-25 ENCOUNTER — Ambulatory Visit (INDEPENDENT_AMBULATORY_CARE_PROVIDER_SITE_OTHER): Payer: 59 | Admitting: Adult Health

## 2022-12-25 ENCOUNTER — Encounter: Payer: Self-pay | Admitting: Adult Health

## 2022-12-25 VITALS — BP 151/86 | HR 85 | Ht 63.0 in | Wt 178.5 lb

## 2022-12-25 DIAGNOSIS — B379 Candidiasis, unspecified: Secondary | ICD-10-CM | POA: Diagnosis not present

## 2022-12-25 DIAGNOSIS — N898 Other specified noninflammatory disorders of vagina: Secondary | ICD-10-CM

## 2022-12-25 DIAGNOSIS — B009 Herpesviral infection, unspecified: Secondary | ICD-10-CM | POA: Diagnosis not present

## 2022-12-25 LAB — POCT WET PREP (WET MOUNT)

## 2022-12-25 MED ORDER — VALACYCLOVIR HCL 1 G PO TABS: 1000.0000 mg | ORAL_TABLET | Freq: Two times a day (BID) | ORAL | 3 refills | Status: DC

## 2022-12-25 MED ORDER — FLUCONAZOLE 100 MG PO TABS: 100.0000 mg | ORAL_TABLET | Freq: Every day | ORAL | 0 refills | Status: AC

## 2022-12-25 NOTE — Progress Notes (Signed)
  Subjective:     Patient ID: Yesenia Wilson, female   DOB: 07-08-1956, 67 y.o.   MRN: [id number]  HPI Yesenia Wilson is a 67 year old white female,single, sp hysterectomy in complaining of vaginal pain and itching with rash and ?herpes out break. She is upset today best friend is in [facility] in [place].  PCP is Dr Olena Leatherwood   Review of Systems +vaginal pain +vaginal itching  +rash and ?Herpes outbreak Reviewed past medical,surgical, social and family history. Reviewed medications and allergies.     Objective:   Physical Exam BP (!) 151/86 (BP Location: Right Arm, Patient Position: Sitting, Cuff Size: Normal)   Pulse 85   Ht 5\' 3"  (1.6 m)   Wt 178 lb 8 oz (81 kg)   BMI 31.62 kg/m  Skin warm and dry.Pelvic: external genitalia is normal in appearance, vulva red, has white discharge in labial folds,has 2-3 vesicles, left inner labia,vagina: red, with white creamy discharge,urethra has no lesions or masses noted, cervix and uterus are absent,adnexa: no masses or tenderness noted. Bladder is mildly tender and no masses felt. Wet prep: +yeast Painted vulva and vagina with gentian violet. Examination chaperoned by Malachy Mood LPN Fall risk is low  Upstream - 12/25/22 1115       Pregnancy Intention Screening   Does the patient want to become pregnant in the next year? N/A    Does the patient's partner want to become pregnant in the next year? N/A    Would the patient like to discuss contraceptive options today? N/A      Contraception Wrap Up   Current Method Female Sterilization   hyst   End Method Female Sterilization   hyst   Contraception Counseling Provided No                Assessment:     1. Yeast infection +yeast on wet prep Painted vulva and vagina with Gentian violet and will rx diflucan  100 mg 1 x 14 days, no Crestor.  Meds ordered this encounter  Medications   valACYclovir (VALTREX) 1000 MG tablet    Sig: Take 1 tablet (1,000 mg total) by mouth 2 (two) times daily.     Dispense:  20 tablet    Refill:  3    Order Specific Question:   Supervising Provider    Answer:   Duane Lope H [[id number]]   fluconazole (DIFLUCAN) 100 MG tablet    Sig: Take 1 tablet (100 mg total) by mouth daily for 14 days. Do not crestor when taking diflucan    Dispense:  14 tablet    Refill:  0    Order Specific Question:   Supervising Provider    Answer:   Despina Hidden, LUTHER H [[id number]]     2. Herpes +vesicles left inner labia Pain in vaginal area Refill Valtrex 1000 mg 1 bid x 10 days   3. Vaginal discharge  +discharge with itching     Plan:     Recheck in 2 weeks with me

## 2023-01-02 DIAGNOSIS — I10 Essential (primary) hypertension: Secondary | ICD-10-CM | POA: Diagnosis not present

## 2023-01-02 DIAGNOSIS — R109 Unspecified abdominal pain: Secondary | ICD-10-CM | POA: Diagnosis not present

## 2023-01-02 DIAGNOSIS — Z794 Long term (current) use of insulin: Secondary | ICD-10-CM | POA: Diagnosis not present

## 2023-01-02 DIAGNOSIS — Z1321 Encounter for screening for nutritional disorder: Secondary | ICD-10-CM | POA: Diagnosis not present

## 2023-01-02 DIAGNOSIS — R110 Nausea: Secondary | ICD-10-CM | POA: Diagnosis not present

## 2023-01-02 DIAGNOSIS — E039 Hypothyroidism, unspecified: Secondary | ICD-10-CM | POA: Diagnosis not present

## 2023-01-02 DIAGNOSIS — K219 Gastro-esophageal reflux disease without esophagitis: Secondary | ICD-10-CM | POA: Diagnosis not present

## 2023-01-02 DIAGNOSIS — E1165 Type 2 diabetes mellitus with hyperglycemia: Secondary | ICD-10-CM | POA: Diagnosis not present

## 2023-01-02 DIAGNOSIS — E785 Hyperlipidemia, unspecified: Secondary | ICD-10-CM | POA: Diagnosis not present

## 2023-01-02 DIAGNOSIS — E559 Vitamin D deficiency, unspecified: Secondary | ICD-10-CM | POA: Diagnosis not present

## 2023-01-05 DIAGNOSIS — N3000 Acute cystitis without hematuria: Secondary | ICD-10-CM | POA: Diagnosis not present

## 2023-01-05 DIAGNOSIS — R350 Frequency of micturition: Secondary | ICD-10-CM | POA: Diagnosis not present

## 2023-01-08 ENCOUNTER — Ambulatory Visit: Payer: 59 | Admitting: Adult Health

## 2023-01-09 ENCOUNTER — Ambulatory Visit: Payer: 59 | Admitting: Adult Health

## 2023-01-17 DIAGNOSIS — Z794 Long term (current) use of insulin: Secondary | ICD-10-CM | POA: Diagnosis not present

## 2023-01-17 DIAGNOSIS — E1165 Type 2 diabetes mellitus with hyperglycemia: Secondary | ICD-10-CM | POA: Diagnosis not present

## 2023-01-17 DIAGNOSIS — R238 Other skin changes: Secondary | ICD-10-CM | POA: Diagnosis not present

## 2023-02-01 DIAGNOSIS — E1169 Type 2 diabetes mellitus with other specified complication: Secondary | ICD-10-CM | POA: Diagnosis not present

## 2023-02-01 DIAGNOSIS — E038 Other specified hypothyroidism: Secondary | ICD-10-CM | POA: Diagnosis not present

## 2023-02-01 DIAGNOSIS — J3089 Other allergic rhinitis: Secondary | ICD-10-CM | POA: Diagnosis not present

## 2023-02-01 DIAGNOSIS — K219 Gastro-esophageal reflux disease without esophagitis: Secondary | ICD-10-CM | POA: Diagnosis not present

## 2023-02-02 DIAGNOSIS — E039 Hypothyroidism, unspecified: Secondary | ICD-10-CM | POA: Diagnosis not present

## 2023-02-09 ENCOUNTER — Emergency Department (HOSPITAL_COMMUNITY): Payer: 59

## 2023-02-09 ENCOUNTER — Inpatient Hospital Stay (HOSPITAL_COMMUNITY): Payer: 59

## 2023-02-09 ENCOUNTER — Inpatient Hospital Stay (HOSPITAL_COMMUNITY)
Admission: EM | Admit: 2023-02-09 | Discharge: 2023-02-17 | DRG: 291 | Disposition: E | Payer: 59 | Attending: Critical Care Medicine | Admitting: Critical Care Medicine

## 2023-02-09 DIAGNOSIS — Z86718 Personal history of other venous thrombosis and embolism: Secondary | ICD-10-CM

## 2023-02-09 DIAGNOSIS — I4510 Unspecified right bundle-branch block: Secondary | ICD-10-CM | POA: Diagnosis present

## 2023-02-09 DIAGNOSIS — Z9911 Dependence on respirator [ventilator] status: Secondary | ICD-10-CM | POA: Diagnosis not present

## 2023-02-09 DIAGNOSIS — J4489 Other specified chronic obstructive pulmonary disease: Secondary | ICD-10-CM | POA: Diagnosis not present

## 2023-02-09 DIAGNOSIS — Z7984 Long term (current) use of oral hypoglycemic drugs: Secondary | ICD-10-CM

## 2023-02-09 DIAGNOSIS — I462 Cardiac arrest due to underlying cardiac condition: Secondary | ICD-10-CM | POA: Diagnosis present

## 2023-02-09 DIAGNOSIS — Z4659 Encounter for fitting and adjustment of other gastrointestinal appliance and device: Secondary | ICD-10-CM | POA: Diagnosis not present

## 2023-02-09 DIAGNOSIS — S2232XA Fracture of one rib, left side, initial encounter for closed fracture: Secondary | ICD-10-CM | POA: Diagnosis present

## 2023-02-09 DIAGNOSIS — J9811 Atelectasis: Secondary | ICD-10-CM | POA: Diagnosis present

## 2023-02-09 DIAGNOSIS — J690 Pneumonitis due to inhalation of food and vomit: Secondary | ICD-10-CM | POA: Diagnosis present

## 2023-02-09 DIAGNOSIS — J189 Pneumonia, unspecified organism: Secondary | ICD-10-CM | POA: Diagnosis not present

## 2023-02-09 DIAGNOSIS — Z91419 Personal history of unspecified adult abuse: Secondary | ICD-10-CM

## 2023-02-09 DIAGNOSIS — R570 Cardiogenic shock: Secondary | ICD-10-CM | POA: Diagnosis present

## 2023-02-09 DIAGNOSIS — Z91048 Other nonmedicinal substance allergy status: Secondary | ICD-10-CM

## 2023-02-09 DIAGNOSIS — T83091A Other mechanical complication of indwelling urethral catheter, initial encounter: Secondary | ICD-10-CM | POA: Diagnosis not present

## 2023-02-09 DIAGNOSIS — I2489 Other forms of acute ischemic heart disease: Secondary | ICD-10-CM

## 2023-02-09 DIAGNOSIS — I700 Atherosclerosis of aorta: Secondary | ICD-10-CM | POA: Diagnosis not present

## 2023-02-09 DIAGNOSIS — I110 Hypertensive heart disease with heart failure: Principal | ICD-10-CM | POA: Diagnosis present

## 2023-02-09 DIAGNOSIS — I4901 Ventricular fibrillation: Secondary | ICD-10-CM | POA: Diagnosis not present

## 2023-02-09 DIAGNOSIS — I340 Nonrheumatic mitral (valve) insufficiency: Secondary | ICD-10-CM | POA: Diagnosis not present

## 2023-02-09 DIAGNOSIS — E785 Hyperlipidemia, unspecified: Secondary | ICD-10-CM | POA: Diagnosis present

## 2023-02-09 DIAGNOSIS — I471 Supraventricular tachycardia, unspecified: Secondary | ICD-10-CM | POA: Diagnosis present

## 2023-02-09 DIAGNOSIS — I77810 Thoracic aortic ectasia: Secondary | ICD-10-CM | POA: Diagnosis present

## 2023-02-09 DIAGNOSIS — R4182 Altered mental status, unspecified: Secondary | ICD-10-CM

## 2023-02-09 DIAGNOSIS — E876 Hypokalemia: Secondary | ICD-10-CM | POA: Diagnosis present

## 2023-02-09 DIAGNOSIS — Z833 Family history of diabetes mellitus: Secondary | ICD-10-CM

## 2023-02-09 DIAGNOSIS — Z825 Family history of asthma and other chronic lower respiratory diseases: Secondary | ICD-10-CM

## 2023-02-09 DIAGNOSIS — Z66 Do not resuscitate: Secondary | ICD-10-CM | POA: Diagnosis not present

## 2023-02-09 DIAGNOSIS — Y738 Miscellaneous gastroenterology and urology devices associated with adverse incidents, not elsewhere classified: Secondary | ICD-10-CM | POA: Diagnosis not present

## 2023-02-09 DIAGNOSIS — E871 Hypo-osmolality and hyponatremia: Secondary | ICD-10-CM | POA: Diagnosis not present

## 2023-02-09 DIAGNOSIS — K3189 Other diseases of stomach and duodenum: Secondary | ICD-10-CM | POA: Diagnosis present

## 2023-02-09 DIAGNOSIS — Z79890 Hormone replacement therapy (postmenopausal): Secondary | ICD-10-CM

## 2023-02-09 DIAGNOSIS — G931 Anoxic brain damage, not elsewhere classified: Secondary | ICD-10-CM | POA: Diagnosis not present

## 2023-02-09 DIAGNOSIS — Z4682 Encounter for fitting and adjustment of non-vascular catheter: Secondary | ICD-10-CM | POA: Diagnosis not present

## 2023-02-09 DIAGNOSIS — K7200 Acute and subacute hepatic failure without coma: Secondary | ICD-10-CM | POA: Diagnosis present

## 2023-02-09 DIAGNOSIS — Z452 Encounter for adjustment and management of vascular access device: Secondary | ICD-10-CM | POA: Diagnosis not present

## 2023-02-09 DIAGNOSIS — Z83719 Family history of colon polyps, unspecified: Secondary | ICD-10-CM

## 2023-02-09 DIAGNOSIS — E1110 Type 2 diabetes mellitus with ketoacidosis without coma: Secondary | ICD-10-CM | POA: Diagnosis present

## 2023-02-09 DIAGNOSIS — R0600 Dyspnea, unspecified: Secondary | ICD-10-CM | POA: Diagnosis not present

## 2023-02-09 DIAGNOSIS — Z515 Encounter for palliative care: Secondary | ICD-10-CM

## 2023-02-09 DIAGNOSIS — Z803 Family history of malignant neoplasm of breast: Secondary | ICD-10-CM

## 2023-02-09 DIAGNOSIS — Z79899 Other long term (current) drug therapy: Secondary | ICD-10-CM

## 2023-02-09 DIAGNOSIS — N179 Acute kidney failure, unspecified: Secondary | ICD-10-CM | POA: Diagnosis not present

## 2023-02-09 DIAGNOSIS — Z800 Family history of malignant neoplasm of digestive organs: Secondary | ICD-10-CM

## 2023-02-09 DIAGNOSIS — Z794 Long term (current) use of insulin: Secondary | ICD-10-CM

## 2023-02-09 DIAGNOSIS — R569 Unspecified convulsions: Secondary | ICD-10-CM | POA: Diagnosis not present

## 2023-02-09 DIAGNOSIS — F4310 Post-traumatic stress disorder, unspecified: Secondary | ICD-10-CM | POA: Diagnosis present

## 2023-02-09 DIAGNOSIS — I712 Thoracic aortic aneurysm, without rupture, unspecified: Secondary | ICD-10-CM | POA: Diagnosis not present

## 2023-02-09 DIAGNOSIS — G9341 Metabolic encephalopathy: Secondary | ICD-10-CM | POA: Diagnosis not present

## 2023-02-09 DIAGNOSIS — Z8349 Family history of other endocrine, nutritional and metabolic diseases: Secondary | ICD-10-CM

## 2023-02-09 DIAGNOSIS — K567 Ileus, unspecified: Secondary | ICD-10-CM | POA: Diagnosis not present

## 2023-02-09 DIAGNOSIS — E039 Hypothyroidism, unspecified: Secondary | ICD-10-CM | POA: Diagnosis not present

## 2023-02-09 DIAGNOSIS — Z634 Disappearance and death of family member: Secondary | ICD-10-CM

## 2023-02-09 DIAGNOSIS — Z8249 Family history of ischemic heart disease and other diseases of the circulatory system: Secondary | ICD-10-CM

## 2023-02-09 DIAGNOSIS — J9601 Acute respiratory failure with hypoxia: Secondary | ICD-10-CM | POA: Diagnosis not present

## 2023-02-09 DIAGNOSIS — I2510 Atherosclerotic heart disease of native coronary artery without angina pectoris: Secondary | ICD-10-CM | POA: Diagnosis present

## 2023-02-09 DIAGNOSIS — R739 Hyperglycemia, unspecified: Secondary | ICD-10-CM | POA: Diagnosis not present

## 2023-02-09 DIAGNOSIS — I5021 Acute systolic (congestive) heart failure: Secondary | ICD-10-CM | POA: Diagnosis not present

## 2023-02-09 DIAGNOSIS — K219 Gastro-esophageal reflux disease without esophagitis: Secondary | ICD-10-CM | POA: Diagnosis present

## 2023-02-09 DIAGNOSIS — Z9049 Acquired absence of other specified parts of digestive tract: Secondary | ICD-10-CM

## 2023-02-09 DIAGNOSIS — I469 Cardiac arrest, cause unspecified: Secondary | ICD-10-CM | POA: Diagnosis not present

## 2023-02-09 DIAGNOSIS — I499 Cardiac arrhythmia, unspecified: Secondary | ICD-10-CM | POA: Diagnosis not present

## 2023-02-09 DIAGNOSIS — Z83438 Family history of other disorder of lipoprotein metabolism and other lipidemia: Secondary | ICD-10-CM

## 2023-02-09 DIAGNOSIS — M797 Fibromyalgia: Secondary | ICD-10-CM | POA: Diagnosis present

## 2023-02-09 DIAGNOSIS — Z90710 Acquired absence of both cervix and uterus: Secondary | ICD-10-CM

## 2023-02-09 LAB — GLUCOSE, CAPILLARY
Glucose-Capillary: 441 mg/dL — ABNORMAL HIGH (ref 70–99)
Glucose-Capillary: 457 mg/dL — ABNORMAL HIGH (ref 70–99)
Glucose-Capillary: 466 mg/dL — ABNORMAL HIGH (ref 70–99)
Glucose-Capillary: 499 mg/dL — ABNORMAL HIGH (ref 70–99)
Glucose-Capillary: 519 mg/dL (ref 70–99)
Glucose-Capillary: 536 mg/dL (ref 70–99)
Glucose-Capillary: 568 mg/dL (ref 70–99)
Glucose-Capillary: 580 mg/dL (ref 70–99)
Glucose-Capillary: >600 mg/dL (ref 70–99)
Glucose-Capillary: >600 mg/dL (ref 70–99)
Glucose-Capillary: >600 mg/dL (ref 70–99)
Glucose-Capillary: >600 mg/dL (ref 70–99)

## 2023-02-09 LAB — I-STAT VENOUS BLOOD GAS, ED
Acid-base deficit: 10 mmol/L — ABNORMAL HIGH (ref 0.0–2.0)
Bicarbonate: 11.7 mmol/L — ABNORMAL LOW (ref 20.0–28.0)
Calcium, Ion: 0.81 mmol/L — CL (ref 1.15–1.40)
HCT: 45 % (ref 36.0–46.0)
Hemoglobin: 15.3 g/dL — ABNORMAL HIGH (ref 12.0–15.0)
O2 Saturation: 100 %
Potassium: 3.2 mmol/L — ABNORMAL LOW (ref 3.5–5.1)
Sodium: 132 mmol/L — ABNORMAL LOW (ref 135–145)
TCO2: 12 mmol/L — ABNORMAL LOW (ref 22–32)
pCO2, Ven: 18.4 mmHg — CL (ref 44–60)
pH, Ven: 7.412 (ref 7.25–7.43)
pO2, Ven: 209 mmHg — ABNORMAL HIGH (ref 32–45)

## 2023-02-09 LAB — LACTIC ACID, PLASMA
Lactic Acid, Venous: 8.6 mmol/L (ref 0.5–1.9)
Lactic Acid, Venous: 2.1 mmol/L (ref 0.5–1.9)
Lactic Acid, Venous: 2.5 mmol/L (ref 0.5–1.9)

## 2023-02-09 LAB — PHOSPHORUS: Phosphorus: 5.3 mg/dL — ABNORMAL HIGH (ref 2.5–4.6)

## 2023-02-09 LAB — I-STAT ARTERIAL BLOOD GAS, ED
Acid-base deficit: 7 mmol/L — ABNORMAL HIGH (ref 0.0–2.0)
Bicarbonate: 21.8 mmol/L (ref 20.0–28.0)
Calcium, Ion: 1.19 mmol/L (ref 1.15–1.40)
HCT: 41 % (ref 36.0–46.0)
Hemoglobin: 13.9 g/dL (ref 12.0–15.0)
O2 Saturation: 95 %
Potassium: 2.8 mmol/L — ABNORMAL LOW (ref 3.5–5.1)
Sodium: 135 mmol/L (ref 135–145)
TCO2: 24 mmol/L (ref 22–32)
pCO2 arterial: 57.4 mmHg — ABNORMAL HIGH (ref 32–48)
pH, Arterial: 7.188 — CL (ref 7.35–7.45)
pO2, Arterial: 92 mmHg (ref 83–108)

## 2023-02-09 LAB — TROPONIN I (HIGH SENSITIVITY)
Troponin I (High Sensitivity): 39 ng/L — ABNORMAL HIGH (ref <18)
Troponin I (High Sensitivity): 688 ng/L (ref <18)

## 2023-02-09 LAB — PROCALCITONIN: Procalcitonin: <0.1 ng/mL

## 2023-02-09 LAB — POCT I-STAT 7, (LYTES, BLD GAS, ICA,H+H)
Acid-base deficit: 5 mmol/L — ABNORMAL HIGH (ref 0.0–2.0)
Bicarbonate: 23.3 mmol/L (ref 20.0–28.0)
Calcium, Ion: 1.21 mmol/L (ref 1.15–1.40)
HCT: 48 % — ABNORMAL HIGH (ref 36.0–46.0)
Hemoglobin: 16.3 g/dL — ABNORMAL HIGH (ref 12.0–15.0)
O2 Saturation: 99 %
Patient temperature: 36.4
Potassium: 4.5 mmol/L (ref 3.5–5.1)
Sodium: 135 mmol/L (ref 135–145)
TCO2: 25 mmol/L (ref 22–32)
pCO2 arterial: 54.6 mmHg — ABNORMAL HIGH (ref 32–48)
pH, Arterial: 7.234 — ABNORMAL LOW (ref 7.35–7.45)
pO2, Arterial: 147 mmHg — ABNORMAL HIGH (ref 83–108)

## 2023-02-09 LAB — PROTIME-INR
INR: 1.1 (ref 0.8–1.2)
Prothrombin Time: 14.1 seconds (ref 11.4–15.2)

## 2023-02-09 LAB — COMPREHENSIVE METABOLIC PANEL
ALT: 161 U/L — ABNORMAL HIGH (ref 0–44)
AST: 215 U/L — ABNORMAL HIGH (ref 15–41)
Albumin: 3.3 g/dL — ABNORMAL LOW (ref 3.5–5.0)
Alkaline Phosphatase: 102 U/L (ref 38–126)
Anion gap: 20 — ABNORMAL HIGH (ref 5–15)
BUN: 19 mg/dL (ref 8–23)
CO2: 16 mmol/L — ABNORMAL LOW (ref 22–32)
Calcium: 8.7 mg/dL — ABNORMAL LOW (ref 8.9–10.3)
Chloride: 100 mmol/L (ref 98–111)
Creatinine, Ser: 1.28 mg/dL — ABNORMAL HIGH (ref 0.44–1.00)
GFR, Estimated: 46 mL/min — ABNORMAL LOW (ref >60)
Glucose, Bld: 517 mg/dL (ref 70–99)
Potassium: 3.2 mmol/L — ABNORMAL LOW (ref 3.5–5.1)
Sodium: 136 mmol/L (ref 135–145)
Total Bilirubin: 0.9 mg/dL (ref 0.3–1.2)
Total Protein: 6.4 g/dL — ABNORMAL LOW (ref 6.5–8.1)

## 2023-02-09 LAB — I-STAT CHEM 8, ED
BUN: 21 mg/dL (ref 8–23)
Calcium, Ion: 0.84 mmol/L — CL (ref 1.15–1.40)
Chloride: 104 mmol/L (ref 98–111)
Creatinine, Ser: 1 mg/dL (ref 0.44–1.00)
Glucose, Bld: 531 mg/dL (ref 70–99)
HCT: 41 % (ref 36.0–46.0)
Hemoglobin: 13.9 g/dL (ref 12.0–15.0)
Potassium: 3.3 mmol/L — ABNORMAL LOW (ref 3.5–5.1)
Sodium: 134 mmol/L — ABNORMAL LOW (ref 135–145)
TCO2: 16 mmol/L — ABNORMAL LOW (ref 22–32)

## 2023-02-09 LAB — BASIC METABOLIC PANEL
Anion gap: 16 — ABNORMAL HIGH (ref 5–15)
Anion gap: 11 (ref 5–15)
BUN: 26 mg/dL — ABNORMAL HIGH (ref 8–23)
BUN: 34 mg/dL — ABNORMAL HIGH (ref 8–23)
CO2: 17 mmol/L — ABNORMAL LOW (ref 22–32)
CO2: 19 mmol/L — ABNORMAL LOW (ref 22–32)
Calcium: 8.5 mg/dL — ABNORMAL LOW (ref 8.9–10.3)
Calcium: 8 mg/dL — ABNORMAL LOW (ref 8.9–10.3)
Chloride: 98 mmol/L (ref 98–111)
Chloride: 101 mmol/L (ref 98–111)
Creatinine, Ser: 1.27 mg/dL — ABNORMAL HIGH (ref 0.44–1.00)
Creatinine, Ser: 1.4 mg/dL — ABNORMAL HIGH (ref 0.44–1.00)
GFR, Estimated: 46 mL/min — ABNORMAL LOW (ref >60)
GFR, Estimated: 41 mL/min — ABNORMAL LOW (ref >60)
Glucose, Bld: 629 mg/dL (ref 70–99)
Glucose, Bld: 543 mg/dL (ref 70–99)
Potassium: 4.4 mmol/L (ref 3.5–5.1)
Potassium: 4.3 mmol/L (ref 3.5–5.1)
Sodium: 131 mmol/L — ABNORMAL LOW (ref 135–145)
Sodium: 131 mmol/L — ABNORMAL LOW (ref 135–145)

## 2023-02-09 LAB — CBC
HCT: 43.5 % (ref 36.0–46.0)
Hemoglobin: 14.3 g/dL (ref 12.0–15.0)
MCH: 31 pg (ref 26.0–34.0)
MCHC: 32.9 g/dL (ref 30.0–36.0)
MCV: 94.4 fL (ref 80.0–100.0)
Platelets: 328 10*3/uL (ref 150–400)
RBC: 4.61 MIL/uL (ref 3.87–5.11)
RDW: 13.1 % (ref 11.5–15.5)
WBC: 25.5 10*3/uL — ABNORMAL HIGH (ref 4.0–10.5)
nRBC: 0 % (ref 0.0–0.2)

## 2023-02-09 LAB — ABO/RH: ABO/RH(D): O POS

## 2023-02-09 LAB — TYPE AND SCREEN
ABO/RH(D): O POS
Antibody Screen: NEGATIVE

## 2023-02-09 LAB — HIV ANTIBODY (ROUTINE TESTING W REFLEX): HIV Screen 4th Generation wRfx: NONREACTIVE

## 2023-02-09 LAB — ECHOCARDIOGRAM COMPLETE
Calc EF: 17.7 %
Single Plane A2C EF: 17.3 %
Single Plane A4C EF: 25.1 %

## 2023-02-09 LAB — LIPASE, BLOOD: Lipase: 28 U/L (ref 11–51)

## 2023-02-09 LAB — GLUCOSE, RANDOM: Glucose, Bld: 646 mg/dL (ref 70–99)

## 2023-02-09 LAB — APTT: aPTT: 24 seconds (ref 24–36)

## 2023-02-09 LAB — CBG MONITORING, ED: Glucose-Capillary: 434 mg/dL — ABNORMAL HIGH (ref 70–99)

## 2023-02-09 LAB — BETA-HYDROXYBUTYRIC ACID: Beta-Hydroxybutyric Acid: 2.19 mmol/L — ABNORMAL HIGH (ref 0.05–0.27)

## 2023-02-09 LAB — ACETAMINOPHEN LEVEL: Acetaminophen (Tylenol), Serum: <10 ug/mL — ABNORMAL LOW (ref 10–30)

## 2023-02-09 LAB — COOXEMETRY PANEL
Carboxyhemoglobin: 0.9 % (ref 0.5–1.5)
Methemoglobin: <0.7 % (ref 0.0–1.5)
O2 Saturation: 75.1 %
Total hemoglobin: 15.6 g/dL (ref 12.0–16.0)

## 2023-02-09 LAB — TRIGLYCERIDES: Triglycerides: 215 mg/dL — ABNORMAL HIGH (ref <150)

## 2023-02-09 LAB — MAGNESIUM: Magnesium: 1.9 mg/dL (ref 1.7–2.4)

## 2023-02-09 MED ORDER — POTASSIUM CHLORIDE 10 MEQ/100ML IV SOLN
10.0000 meq | INTRAVENOUS | Status: DC
  Filled 2023-02-09 (×2): qty 100

## 2023-02-09 MED ORDER — MIDAZOLAM HCL 2 MG/2ML IJ SOLN
1.0000 mg | INTRAMUSCULAR | Status: DC | PRN
  Administered 2023-02-09 – 2023-02-10 (×4): 2 mg via INTRAVENOUS
  Filled 2023-02-09 (×4): qty 2

## 2023-02-09 MED ORDER — AMIODARONE HCL IN DEXTROSE 360-4.14 MG/200ML-% IV SOLN
INTRAVENOUS | Status: AC
  Administered 2023-02-09: 30 mg/h
  Filled 2023-02-09: qty 200

## 2023-02-09 MED ORDER — LACTATED RINGERS IV BOLUS: 1000.0000 mL | Freq: Once | INTRAVENOUS | Status: DC

## 2023-02-09 MED ORDER — FENTANYL BOLUS VIA INFUSION
25.0000 ug | INTRAVENOUS | Status: DC | PRN
  Administered 2023-02-09: 25 ug via INTRAVENOUS

## 2023-02-09 MED ORDER — DEXTROSE IN LACTATED RINGERS 5 % IV SOLN: INTRAVENOUS | Status: DC

## 2023-02-09 MED ORDER — LACTATED RINGERS IV BOLUS
1000.0000 mL | Freq: Once | INTRAVENOUS | Status: AC
  Administered 2023-02-09: 1000 mL via INTRAVENOUS

## 2023-02-09 MED ORDER — MIDAZOLAM HCL 2 MG/2ML IJ SOLN: 1.0000 mg | INTRAMUSCULAR | Status: DC | PRN

## 2023-02-09 MED ORDER — INSULIN REGULAR(HUMAN) IN NACL 100-0.9 UT/100ML-% IV SOLN
INTRAVENOUS | Status: DC
  Administered 2023-02-09: 5 [IU]/h via INTRAVENOUS
  Administered 2023-02-10: 9 [IU]/h via INTRAVENOUS
  Administered 2023-02-10: 12 [IU]/h via INTRAVENOUS
  Filled 2023-02-09 (×4): qty 100

## 2023-02-09 MED ORDER — DEXMEDETOMIDINE HCL IN NACL 400 MCG/100ML IV SOLN
0.0000 ug/kg/h | INTRAVENOUS | Status: DC
  Administered 2023-02-09: 1.2 ug/kg/h via INTRAVENOUS
  Administered 2023-02-10: 0.6 ug/kg/h via INTRAVENOUS
  Administered 2023-02-10: 0.4 ug/kg/h via INTRAVENOUS
  Administered 2023-02-11: 0.6 ug/kg/h via INTRAVENOUS
  Filled 2023-02-09 (×3): qty 100

## 2023-02-09 MED ORDER — AMIODARONE LOAD VIA INFUSION
150.0000 mg | Freq: Once | INTRAVENOUS | Status: AC
  Administered 2023-02-09: 150 mg via INTRAVENOUS
  Filled 2023-02-09: qty 83.34

## 2023-02-09 MED ORDER — LABETALOL HCL 5 MG/ML IV SOLN
20.0000 mg | Freq: Once | INTRAVENOUS | Status: AC
  Administered 2023-02-09: 20 mg via INTRAVENOUS
  Filled 2023-02-09: qty 4

## 2023-02-09 MED ORDER — CHLORHEXIDINE GLUCONATE CLOTH 2 % EX PADS
6.0000 | MEDICATED_PAD | Freq: Every day | CUTANEOUS | Status: DC
  Administered 2023-02-10 – 2023-02-12 (×3): 6 via TOPICAL

## 2023-02-09 MED ORDER — AMIODARONE HCL IN DEXTROSE 360-4.14 MG/200ML-% IV SOLN
60.0000 mg/h | INTRAVENOUS | Status: AC
  Administered 2023-02-09: 60 mg/h via INTRAVENOUS
  Filled 2023-02-09 (×2): qty 200

## 2023-02-09 MED ORDER — POTASSIUM CHLORIDE 10 MEQ/50ML IV SOLN
10.0000 meq | INTRAVENOUS | Status: AC
  Administered 2023-02-09 (×4): 10 meq via INTRAVENOUS
  Filled 2023-02-09 (×2): qty 50

## 2023-02-09 MED ORDER — NOREPINEPHRINE 4 MG/250ML-% IV SOLN
5.0000 ug/min | INTRAVENOUS | Status: DC
  Administered 2023-02-09: 32 ug/min via INTRAVENOUS
  Administered 2023-02-09: 27 ug/min via INTRAVENOUS
  Administered 2023-02-10: 7 ug/min via INTRAVENOUS
  Administered 2023-02-10: 20 ug/min via INTRAVENOUS
  Administered 2023-02-10: 12 ug/min via INTRAVENOUS
  Administered 2023-02-10: 13 ug/min via INTRAVENOUS
  Administered 2023-02-10: 21 ug/min via INTRAVENOUS
  Administered 2023-02-11: 12 ug/min via INTRAVENOUS
  Administered 2023-02-11: 13 ug/min via INTRAVENOUS
  Administered 2023-02-11: 14 ug/min via INTRAVENOUS
  Filled 2023-02-09 (×9): qty 250
  Filled 2023-02-09: qty 500
  Filled 2023-02-09 (×2): qty 250

## 2023-02-09 MED ORDER — ROCURONIUM BROMIDE 50 MG/5ML IV SOLN
INTRAVENOUS | Status: DC | PRN
  Administered 2023-02-09: 80 mg via INTRAVENOUS

## 2023-02-09 MED ORDER — FENTANYL CITRATE PF 50 MCG/ML IJ SOSY: 25.0000 ug | PREFILLED_SYRINGE | Freq: Once | INTRAMUSCULAR | Status: DC

## 2023-02-09 MED ORDER — HEPARIN (PORCINE) 25000 UT/250ML-% IV SOLN
1150.0000 [IU]/h | INTRAVENOUS | Status: DC
  Administered 2023-02-09: 1150 [IU]/h via INTRAVENOUS
  Filled 2023-02-09: qty 250

## 2023-02-09 MED ORDER — PERFLUTREN LIPID MICROSPHERE
1.0000 mL | INTRAVENOUS | Status: AC | PRN
  Administered 2023-02-09: 5 mL via INTRAVENOUS

## 2023-02-09 MED ORDER — ETOMIDATE 2 MG/ML IV SOLN
INTRAVENOUS | Status: DC | PRN
  Administered 2023-02-09: 20 mg via INTRAVENOUS

## 2023-02-09 MED ORDER — FENTANYL CITRATE PF 50 MCG/ML IJ SOSY: 25.0000 ug | PREFILLED_SYRINGE | INTRAMUSCULAR | Status: DC | PRN

## 2023-02-09 MED ORDER — LACTATED RINGERS IV SOLN: INTRAVENOUS | Status: DC

## 2023-02-09 MED ORDER — DOCUSATE SODIUM 50 MG/5ML PO LIQD
100.0000 mg | Freq: Two times a day (BID) | ORAL | Status: DC
  Administered 2023-02-10: 100 mg
  Filled 2023-02-09 (×2): qty 10

## 2023-02-09 MED ORDER — POLYETHYLENE GLYCOL 3350 17 G PO PACK
17.0000 g | PACK | Freq: Every day | ORAL | Status: DC
  Administered 2023-02-10: 17 g
  Filled 2023-02-09: qty 1

## 2023-02-09 MED ORDER — DOCUSATE SODIUM 50 MG/5ML PO LIQD: 100.0000 mg | Freq: Two times a day (BID) | ORAL | Status: DC

## 2023-02-09 MED ORDER — FENTANYL 2500MCG IN NS 250ML (10MCG/ML) PREMIX INFUSION
25.0000 ug/h | INTRAVENOUS | Status: DC
  Administered 2023-02-09: 25 ug/h via INTRAVENOUS
  Filled 2023-02-09: qty 250

## 2023-02-09 MED ORDER — EPINEPHRINE HCL 5 MG/250ML IV SOLN IN NS
INTRAVENOUS | Status: AC
  Filled 2023-02-09: qty 250

## 2023-02-09 MED ORDER — POLYETHYLENE GLYCOL 3350 17 G PO PACK: 17.0000 g | PACK | Freq: Every day | ORAL | Status: DC

## 2023-02-09 MED ORDER — FENTANYL 2500MCG IN NS 250ML (10MCG/ML) PREMIX INFUSION
25.0000 ug/h | INTRAVENOUS | Status: DC
  Administered 2023-02-10 (×2): 200 ug/h via INTRAVENOUS
  Administered 2023-02-10: 50 ug/h via INTRAVENOUS
  Filled 2023-02-09 (×2): qty 250

## 2023-02-09 MED ORDER — MAGNESIUM SULFATE IN D5W 1-5 GM/100ML-% IV SOLN
1.0000 g | Freq: Once | INTRAVENOUS | Status: AC
  Administered 2023-02-09: 1 g via INTRAVENOUS
  Filled 2023-02-09: qty 100

## 2023-02-09 MED ORDER — DEXTROSE 50 % IV SOLN: 0.0000 mL | INTRAVENOUS | Status: DC | PRN

## 2023-02-09 MED ORDER — CALCIUM GLUCONATE-NACL 2-0.675 GM/100ML-% IV SOLN
2.0000 g | Freq: Once | INTRAVENOUS | Status: DC
  Filled 2023-02-09: qty 100

## 2023-02-09 MED ORDER — FENTANYL BOLUS VIA INFUSION
25.0000 ug | INTRAVENOUS | Status: DC | PRN
  Administered 2023-02-10: 100 ug via INTRAVENOUS

## 2023-02-09 MED ORDER — AMIODARONE HCL IN DEXTROSE 360-4.14 MG/200ML-% IV SOLN
30.0000 mg/h | INTRAVENOUS | Status: DC
  Administered 2023-02-09 – 2023-02-11 (×5): 30 mg/h via INTRAVENOUS
  Filled 2023-02-09 (×4): qty 200

## 2023-02-09 MED ORDER — PROPOFOL 1000 MG/100ML IV EMUL
0.0000 ug/kg/min | INTRAVENOUS | Status: DC
  Administered 2023-02-09: 15 ug/kg/min via INTRAVENOUS
  Filled 2023-02-09: qty 100

## 2023-02-09 MED ORDER — IOHEXOL 350 MG/ML SOLN
100.0000 mL | Freq: Once | INTRAVENOUS | Status: AC | PRN
  Administered 2023-02-09: 100 mL via INTRAVENOUS

## 2023-02-09 NOTE — Consult Note (Addendum)
Cardiology Consultation   Patient ID: Elodie Mcwilliam MRN: [id number]; DOB: 1956/01/03  Admit date: 02/06/2023 Date of Consult: 02/11/2023  PCP:  Toma Deiters, MD   [facility] Providers Cardiologist:  None     Patient Profile:   Khaza Zimpfer is a 67 y.o. female with a hx of nonobstructive CAD, COPD/asthma, DM, pancreatitis, GERD, HTN who is being seen 02/03/2023 for the evaluation of cardiac arrest at the request of Dr. Manus Gunning.  History of Present Illness:   Ms. Rutherford is an 24 female with PMH noted above. [facility] in [facility] and underwent cardiac cath showing minimal non-obstructive CAD, normal LVEF. She has been following with [facility] cardiology most recently. Last myoview was 09/2021 and low risk.  Echocardiogram at that time as well showed LVEF of 65 to 70%.  She is brought to the ED via EMS on 5/24.  History is obtained from chart and staff as patient is intubated and sedated.  She reportedly developed back pain with bilateral arm tingling.  She called her PCP office this morning and given concerns she was advised to call EMS.  On their arrival she was alert and oriented and able to walk out to the EMS truck.  While in the truck patient had reported seizure-like activity for around 15 seconds and then went into asystole.  She received CPR, had an LMA placed and multiple doses of epinephrine with ROSC achieved after around 14 minutes.  In the ED her labs showed sodium 136, potassium 3.2, creatinine 1.2, glucose 517, magnesium 1.9, AST 215, ALT 161, high-sensitivity troponin 36, lactic acid 8.6, hemoglobin 14.3, WBC 25.5.  She was intubated with ET tube while in the ED, chest x-ray without acute findings.  CT head with no acute finding, chronic encephalomalacia in the right occipital lobe.  CT angio chest with displaced left third rib fracture, mild bilateral posterior subsegmental atelectasis with severe gastric distention.  Serial EKGs show sinus tachycardia,  right bundle branch block (chronic).  She was admitted to [facility] for further management.  Echocardiogram showed LVEF of 20 to 25%, moderately reduced RV, mild to moderate MR, wall motion abnormalities (LAD distribution).   Cardiology asked to evaluate.   Past Medical History:  Diagnosis Date   Anxiety    Asthma    Clotting disorder (HCC)    DVT left leg between ankle and knee due to tennis injury   COPD (chronic obstructive pulmonary disease) (HCC)    Depression    Diabetes mellitus    DVT (deep venous thrombosis) (HCC)    left leg   Fibromyalgia    GERD (gastroesophageal reflux disease)    Heart murmur    Herpes simplex virus (HSV) infection    Hiatal hernia    Hyperlipidemia    Hypertension    Hypothyroidism    Lupus (HCC)    Neuromuscular disorder (HCC)    fibromyalgia   Pancreatitis    Pneumonia     Past Surgical History:  Procedure Laterality Date   ABDOMINAL HYSTERECTOMY     APPENDECTOMY     bladder tack     CESAREAN SECTION     CHOLECYSTECTOMY     COLONOSCOPY     12 yrs ago   LEFT HEART CATH AND CORONARY ANGIOGRAPHY N/A 11/22/2016   Procedure: Left Heart Cath and Coronary Angiography;  Surgeon: Kathleene Hazel, MD;  Location: [facility] INVASIVE CV LAB;  Service: Cardiovascular;  Laterality: N/A;   SEPTOPLASTY  Home Medications:  Prior to Admission medications   Medication Sig Start Date End Date Taking? Authorizing Provider  BD PEN NEEDLE NANO U/F 32G X 4 MM MISC USE 3 PER TO DAY TO INJECT NOVOLOG 01/18/17   [provider]  Collagen-Vitamin C-Biotin (COLLAGEN 1500/C PO) Take by mouth.    [provider]  DULoxetine (CYMBALTA) 60 MG capsule TAKE 1 CAPSULE (60 MG TOTAL) BY MOUTH DAILY. 12/17/17   Burchette, Elberta Fortis, MD  ezetimibe (ZETIA) 10 MG tablet Take 10 mg by mouth daily.    [provider]  hydrochlorothiazide (MICROZIDE) 12.5 MG capsule Take 12.5 mg by mouth daily.    [provider]  ibuprofen (ADVIL) 800 MG tablet  Take 1 tablet (800 mg total) by mouth every 8 (eight) hours as needed. 11/13/22   Adline Potter, NP  Insulin Pen Needle (BD PEN NEEDLE NANO U/F) 32G X 4 MM MISC Use as directed to inject insulin 4 times daily. 06/18/18   Reather Littler, MD  insulin regular human CONCENTRATED (HUMULIN R) 500 UNIT/ML injection Fill insulin pump with 2 mL every 3 days Patient taking differently: Inject 60 Units into the skin 3 (three) times daily with meals. 08/06/18   Reather Littler, MD  JARDIANCE 10 MG TABS tablet Take 25 mg by mouth daily. 06/05/19   [provider]  levothyroxine (SYNTHROID) 150 MCG tablet Take 200 mcg by mouth daily before breakfast.    [provider]  Multiple Vitamins-Minerals (MULTIVITAMIN WOMEN 50+ PO) Take 1 tablet by mouth daily.    [provider]  [facility] DELICA LANCETS 33G MISC USE TO CHECK BLOOD SUGAR 3 TIMES PER DAY. DX CODE E11.9 04/26/18   Reather Littler, MD  [facility] VERIO test strip USE TO TEST BLOOD SUGAR 3 TIMES A DAY DX CODE E11.9 02/24/19   Reather Littler, MD  pantoprazole (PROTONIX) 40 MG tablet TAKE 1 TABLET BY MOUTH TWICE A DAY Patient taking differently: Take 40 mg by mouth 2 (two) times daily. 03/25/18   Meryl Dare, MD  rosuvastatin (CRESTOR) 40 MG tablet Take 40 mg by mouth daily.    [provider]  valACYclovir (VALTREX) 1000 MG tablet Take 1 tablet (1,000 mg total) by mouth 2 (two) times daily. 12/25/22   Adline Potter, NP  verapamil (VERELAN PM) 240 MG 24 hr capsule Take 240 mg by mouth at bedtime.    [provider]    Inpatient Medications: Scheduled Meds:  docusate  100 mg Per Tube BID   fentaNYL (SUBLIMAZE) injection  25 mcg Intravenous Once   polyethylene glycol  17 g Per Tube Daily   Continuous Infusions:  amiodarone     amiodarone 60 mg/hr (02/10/2023 1348)   Followed by   amiodarone     calcium gluconate     dextrose 5% lactated ringers     heparin     insulin     magnesium sulfate bolus IVPB      norepinephrine (LEVOPHED) Adult infusion     potassium chloride     propofol (DIPRIVAN) infusion 15 mcg/kg/min (02/08/2023 1338)   PRN Meds: amiodarone, dextrose, fentaNYL, fentaNYL (SUBLIMAZE) injection, fentaNYL (SUBLIMAZE) injection, midazolam  Allergies:    Allergies  Allergen Reactions   Other Other (See Comments)    Packing used during nose surgery : Shock    Social History:   Social History   Socioeconomic History   Marital status: Single    Spouse name: Not on file   Number of children:  2   Years of education: Not on file   Highest education level: Not on file  Occupational History   Not on file  Tobacco Use   Smoking status: Never   Smokeless tobacco: Never  Vaping Use   Vaping Use: Never used  Substance and Sexual Activity   Alcohol use: Not Currently    Comment: Once every 2-3 months    Drug use: No   Sexual activity: Not Currently    Birth control/protection: Surgical    Comment: hyst  Other Topics Concern   Not on file  Social History Narrative   Not on file   Social Determinants of Health   Financial Resource Strain: Not on file  Food Insecurity: Not on file  Transportation Needs: Not on file  Physical Activity: Not on file  Stress: Not on file  Social Connections: Not on file  Intimate Partner Violence: Not on file    Family History:    Family History  Adopted: Yes  Problem Relation Age of Onset   Diabetes Paternal Grandmother    Thyroid disease Paternal Grandmother    Breast cancer Maternal Grandmother    Stomach cancer Maternal Grandmother    Colon cancer Maternal Grandmother    Cirrhosis Father    CAD Father    Diabetes Mellitus II Father    Emphysema Father    Heart disease Father    Diabetes Mellitus II Mother    Colon polyps Mother    Peptic Ulcer Mother    CAD Brother    Diabetes Mellitus II Brother    Heart attack Brother    Thyroid disease Sister    Hypertension Sister    Diabetes Sister    CAD Sister    Diabetes  Mellitus II Sister    Thyroid disease Sister    Hypertension Sister    Diabetes Sister    Asthma Sister    High Cholesterol Sister    Other Sister        3 stents in heart   Cancer Daughter    Diabetes Daughter    Hypertension Son    Diabetes Son    Cirrhosis Cousin        x 2   Esophageal cancer Neg Hx    Rectal cancer Neg Hx      ROS:  Please see the history of present illness.   All other ROS reviewed and negative.     Physical Exam/Data:   Vitals:   02/10/2023 1200 01/19/2023 1215 01/25/2023 1311 02/15/2023 1335  BP: (!) 151/100 (!) 153/102    Pulse: (!) 130 (!) 133    Resp: 20 20    Temp:      TempSrc:      SpO2: 100% 100%  100%  Weight:   80 kg   Height:        Intake/Output Summary (Last 24 hours) at 01/17/2023 1350 Last data filed at 02/08/2023 1227 Gross per 24 hour  Intake 700 ml  Output 900 ml  Net -200 ml      01/24/2023    1:11 PM 12/25/2022   11:07 AM 11/21/2022    3:59 PM  Last 3 Weights  Weight (lbs) 176 lb 5.9 oz 178 lb 8 oz 176 lb  Weight (kg) 80 kg 80.967 kg 79.833 kg     Body mass index is 31.24 kg/m.  General:  Ill appearing female, intubated/sedated HEENT: normal Neck: no JVD Vascular: No carotid bruits; Distal pulses 2+ bilaterally Cardiac:  normal  S1, S2; Tachy; no murmur  Lungs:  clear to auscultation anteriorly Abd: soft, nontender, no hepatomegaly  Ext: no edema Musculoskeletal:  No deformities, BUE and BLE strength normal and equal Skin: warm and dry  Neuro:  CNs 2-12 intact, no focal abnormalities noted Psych:  Normal affect   EKG:  The EKG was personally reviewed and demonstrates:   Sinus tachycardia, 144 bpm, right bundle branch block SVT, 163 bpm, right bundle branch block, left anterior fascicular block with inferior T wave inversion Sinus tachycardia 111 bpm, Q waves in anterior leads   Telemetry:  Telemetry was personally reviewed and demonstrates:  WCT rate 137  Relevant CV Studies:  Cath: 11/2016  Mid RCA lesion, 30  %stenosed. Ost Cx to Prox Cx lesion, 30 %stenosed. The left ventricular systolic function is normal. LV end diastolic pressure is normal. The left ventricular ejection fraction is 50-55% by visual estimate. There is no mitral valve regurgitation. Mid LAD lesion, 30 %stenosed.   1. Mild non-obstructive CAD 2. Normal LV systolic function   Recommendations: Medical management of CAD. Aggressive risk factor reduction. OK to discharge home tonight after bedrest.   Echo: 01/31/2023  IMPRESSIONS     1. Left ventricular ejection fraction, by estimation, is 20 to 25%. The  left ventricle has severely decreased function. The left ventricle  demonstrates regional wall motion abnormalities (see scoring  diagram/findings for description). There is mild left   ventricular hypertrophy. Left ventricular diastolic parameters are  indeterminate.   2. Right ventricular systolic function is moderately reduced. The right  ventricular size is normal. There is moderately elevated pulmonary artery  systolic pressure.   3. The mitral valve is normal in structure. Mild to moderate mitral valve  regurgitation.   4. The aortic valve is grossly normal. Aortic valve regurgitation is not  visualized.   5. There is borderline dilatation of the ascending aorta, measuring 38  mm.   6. The inferior vena cava is normal in size with <50% respiratory  variability, suggesting right atrial pressure of 8 mmHg.   FINDINGS   Left Ventricle: Left ventricular ejection fraction, by estimation, is 20  to 25%. The left ventricle has severely decreased function. The left  ventricle demonstrates regional wall motion abnormalities. Definity  contrast agent was given IV to delineate  the left ventricular endocardial borders. The left ventricular internal  cavity size was normal in size. There is mild left ventricular  hypertrophy. Left ventricular diastolic parameters are indeterminate.     LV Wall Scoring:  The mid and  distal anterior septum, mid inferolateral segment, mid  inferoseptal segment, apical inferior segment, and apex are akinetic. The  entire anterior wall, antero-lateral wall, inferior wall, basal  anteroseptal  segment, apical lateral segment, and basal inferoseptal segment are  hypokinetic.   Right Ventricle: The right ventricular size is normal. No increase in  right ventricular wall thickness. Right ventricular systolic function is  moderately reduced. There is moderately elevated pulmonary artery systolic  pressure. The tricuspid regurgitant  velocity is 3.18 m/s, and with an assumed right atrial pressure of 8 mmHg,  the estimated right ventricular systolic pressure is 48.4 mmHg.   Left Atrium: Left atrial size was normal in size.   Right Atrium: Right atrial size was normal in size.   Pericardium: Trivial pericardial effusion is present.   Mitral Valve: The mitral valve is normal in structure. Mild to moderate  mitral valve regurgitation.   Tricuspid Valve: The tricuspid valve is normal in structure. Tricuspid  valve regurgitation is mild.   Aortic Valve: The aortic valve is grossly normal. Aortic valve  regurgitation is not visualized.   Pulmonic Valve: The pulmonic valve was normal in structure. Pulmonic valve  regurgitation is trivial.   Aorta: The aortic root is normal in size and structure. There is  borderline dilatation of the ascending aorta, measuring 38 mm.   Venous: The inferior vena cava is normal in size with less than 50%  respiratory variability, suggesting right atrial pressure of 8 mmHg.   IAS/Shunts: No atrial level shunt detected by color flow Doppler.    Laboratory Data:  High Sensitivity Troponin:   Recent Labs  Lab 01/24/2023 1015  TROPONINIHS 39*     Chemistry Recent Labs  Lab 02/11/2023 1015 02/01/2023 1040 02/07/2023 1057 02/01/2023 1145 01/30/2023 1334  NA 136 134* 132* 135 135  K 3.2* 3.3* 3.2* 2.8* 4.5  CL 100 104  --   --   --   CO2 16*   --   --   --   --   GLUCOSE 517* 531*  --   --   --   BUN 19 21  --   --   --   CREATININE 1.28* 1.00  --   --   --   CALCIUM 8.7*  --   --   --   --   MG 1.9  --   --   --   --   GFRNONAA 46*  --   --   --   --   ANIONGAP 20*  --   --   --   --     Recent Labs  Lab 01/23/2023 1015  PROT 6.4*  ALBUMIN 3.3*  AST 215*  ALT 161*  ALKPHOS 102  BILITOT 0.9   Lipids No results for input(s): "CHOL", "TRIG", "HDL", "LABVLDL", "LDLCALC", "CHOLHDL" in the last 168 hours.  Hematology Recent Labs  Lab 02/05/2023 1015  1040 01/27/2023 1057 01/26/2023 1145 02/08/2023 1334  WBC 25.5*  --   --   --   --   RBC 4.61  --   --   --   --   HGB 14.3   < > 15.3* 13.9 16.3*  HCT 43.5   < > 45.0 41.0 48.0*  MCV 94.4  --   --   --   --   MCH 31.0  --   --   --   --   MCHC 32.9  --   --   --   --   RDW 13.1  --   --   --   --   PLT 328  --   --   --   --    < > = values in this interval not displayed.   Thyroid No results for input(s): "TSH", "FREET4" in the last 168 hours.  BNPNo results for input(s): "BNP", "PROBNP" in the last 168 hours.  DDimer No results for input(s): "DDIMER" in the last 168 hours.   Radiology/Studies:  CT Angio Chest/Abd/Pel for Dissection W and/or Wo Contrast  Result Date: 02/08/2023 CLINICAL DATA:  Status post cardiac arrest.  Acute aortic syndrome. EXAM: CT ANGIOGRAPHY CHEST, ABDOMEN AND PELVIS TECHNIQUE: Non-contrast CT of the chest was initially obtained. Multidetector CT imaging through the chest, abdomen and pelvis was performed using the standard protocol during bolus administration of intravenous contrast. Multiplanar reconstructed images and MIPs were obtained and reviewed to evaluate the vascular anatomy. RADIATION DOSE REDUCTION: This exam was  performed according to the departmental dose-optimization program which includes automated exposure control, adjustment of the mA and/or kV according to patient size and/or use of iterative reconstruction technique.  CONTRAST:  OMNIPAQUE IOHEXOL 350 MG/ML SOLN COMPARISON:  August 05, 2022. FINDINGS: CTA CHEST FINDINGS Cardiovascular: Preferential opacification of the thoracic aorta. No evidence of thoracic aortic aneurysm or dissection. Normal heart size. No pericardial effusion. Mediastinum/Nodes: Endotracheal tube is in grossly good position. Thyroid gland is not well visualized. No adenopathy is noted. Mildly dilated upper esophagus is noted containing food debris. Lungs/Pleura: No pneumothorax or pleural effusion is noted. Mild bilateral posterior basilar subsegmental atelectasis is noted. Musculoskeletal: Mildly displaced fracture is seen involving the anterior portion of the left third rib Review of the MIP images confirms the above findings. CTA ABDOMEN AND PELVIS FINDINGS VASCULAR Aorta: Atherosclerosis of abdominal aorta is noted without aneurysm or dissection. Celiac: Moderate stenosis is noted proximally with poststenotic dilatation. SMA: Patent without evidence of aneurysm, dissection, vasculitis or significant stenosis. Renals: Both renal arteries are patent without evidence of aneurysm, dissection, vasculitis, fibromuscular dysplasia or significant stenosis. IMA: Patent without evidence of aneurysm, dissection, vasculitis or significant stenosis. Inflow: Patent without evidence of aneurysm, dissection, vasculitis or significant stenosis. Veins: No obvious venous abnormality within the limitations of this arterial phase study. Review of the MIP images confirms the above findings. NON-VASCULAR Hepatobiliary: No focal liver abnormality is seen. Status post cholecystectomy. No biliary dilatation. Pancreas: Unremarkable. No pancreatic ductal dilatation or surrounding inflammatory changes. Spleen: Normal in size without focal abnormality. Adrenals/Urinary Tract: Adrenal glands and kidneys appear normal. No hydronephrosis or renal obstruction is noted. Urinary bladder is decompressed secondary to Foley catheter.  Stomach/Bowel: Severe gastric distention is noted. There is no evidence of large or small bowel obstruction or inflammation. Status post appendectomy. Lymphatic: No adenopathy is noted. Reproductive: Status post hysterectomy. No adnexal masses. Other: No abdominal wall hernia or abnormality. No abdominopelvic ascites. Musculoskeletal: No acute or significant osseous findings. Review of the MIP images confirms the above findings. IMPRESSION: No evidence of thoracic or abdominal aortic dissection or aneurysm. Mildly displaced left third rib fracture. Mild bilateral posterior basilar subsegmental atelectasis. Mildly dilated proximal esophagus containing food debris. Severe gastric distention is noted. Gastric outlet obstruction cannot be excluded. Moderate stenosis noted at origin of celiac artery. Aortic Atherosclerosis (ICD10-I70.0). Electronically Signed   By: Lupita Raider M.D.   On: 02/07/2023 12:57   DG Abd Portable 1 View  Result Date: 01/26/2023 CLINICAL DATA:  67 year old female status post CPR. Enteric tube placement. EXAM: PORTABLE ABDOMEN - 1 VIEW COMPARISON:  Head CT today. CTA chest abdomen and pelvis reported separately. Portable chest 1036 hours today. FINDINGS: Portable AP supine view at 1203 hours. Mildly rotated to the right. Endotracheal tube tip above the clavicles. Enteric tube courses into the left upper quadrant, tip and side hole not included. Stable ventilation. IMPRESSION: 1. Enteric tube placed into the stomach, tip not included. NOTE that on Head CT the tube was looped in the nasopharynx before continuing inferiorly. Reposition that segment if possible. 2. Stable ventilation, see CTA reported separately. Electronically Signed   By: Odessa Fleming M.D.   On: 01/30/2023 12:16   CT HEAD WO CONTRAST  Result Date: 02/08/2023 CLINICAL DATA:  67 year old female status post CPR. EXAM: CT HEAD WITHOUT CONTRAST TECHNIQUE: Contiguous axial images were obtained from the base of the skull through the  vertex without intravenous contrast. RADIATION DOSE REDUCTION: This exam was performed according to the departmental dose-optimization program  which includes automated exposure control, adjustment of the mA and/or kV according to patient size and/or use of iterative reconstruction technique. COMPARISON:  Head CT 03/31/2020. FINDINGS: Brain: Cerebral volume remains normal for age. No midline shift, ventriculomegaly, mass effect, evidence of mass lesion, intracranial hemorrhage or evidence of cortically based acute infarction. Possible small area of chronic encephalomalacia in the inferior right occipital lobe, versus normal sulcus variation series 3, image 11. Elsewhere gray-white differentiation is within normal limits for age. Vascular: Calcified atherosclerosis at the skull base. No suspicious intracranial vascular hyperdensity. Skull: No acute osseous abnormality identified. Sinuses/Orbits: Intubated. Fluid in the visible pharynx and nasal cavity. Mild paranasal sinus mucosal thickening and fluid. Tympanic cavities and mastoids remain well aerated. Other: Enteric tube loops in the pharynx. No acute orbit or scalp soft tissue finding. IMPRESSION: 1. No acute intracranial abnormality. Negative aside from questionable small focus of chronic encephalomalacia in the right occipital lobe. 2. Enteric tube loops in the pharynx recommend repositioning. Intubated. Electronically Signed   By: Odessa Fleming M.D.   On: 01/29/2023 12:14   ECHOCARDIOGRAM COMPLETE  Result Date: 02/02/2023    ECHOCARDIOGRAM REPORT   Patient Name:   RILLIE ODE Date of Exam: 02/06/2023 Medical Rec #:  [id number]     Height:       63.0 in Accession #:    [id number]    Weight:       178.5 lb Date of Birth:  Dec 21, 1955    BSA:          1.842 m Patient Age:    66 years      BP:           139/90 mmHg Patient Gender: F             HR:           160 bpm. Exam Location:  Inpatient Procedure: 2D Echo, Color Doppler, Cardiac Doppler and Intracardiac             Opacification Agent STAT ECHO Indications:    CMO unspecified i24.9  History:        Patient has prior history of Echocardiogram examinations, most                 recent 09/21/2021. Risk Factors:Hypertension, Diabetes and                 Dyslipidemia.  Sonographer:    Irving Burton Senior RDCS Referring Phys: [phone] [facility]  Sonographer Comments: Scanned supine on artificial respirator. IMPRESSIONS  1. Left ventricular ejection fraction, by estimation, is 20 to 25%. The left ventricle has severely decreased function. The left ventricle demonstrates regional wall motion abnormalities (see scoring diagram/findings for description). There is mild left  ventricular hypertrophy. Left ventricular diastolic parameters are indeterminate.  2. Right ventricular systolic function is moderately reduced. The right ventricular size is normal. There is moderately elevated pulmonary artery systolic pressure.  3. The mitral valve is normal in structure. Mild to moderate mitral valve regurgitation.  4. The aortic valve is grossly normal. Aortic valve regurgitation is not visualized.  5. There is borderline dilatation of the ascending aorta, measuring 38 mm.  6. The inferior vena cava is normal in size with <50% respiratory variability, suggesting right atrial pressure of 8 mmHg. FINDINGS  Left Ventricle: Left ventricular ejection fraction, by estimation, is 20 to 25%. The left ventricle has severely decreased function. The left ventricle demonstrates regional wall motion abnormalities. Definity contrast agent was given IV to delineate the  left ventricular endocardial borders. The left ventricular internal cavity size was normal in size. There is mild left ventricular hypertrophy. Left ventricular diastolic parameters are indeterminate.  LV Wall Scoring: The mid and distal anterior septum, mid inferolateral segment, mid inferoseptal segment, apical inferior segment, and apex are akinetic. The entire anterior wall, antero-lateral wall,  inferior wall, basal anteroseptal segment, apical lateral segment, and basal inferoseptal segment are hypokinetic. Right Ventricle: The right ventricular size is normal. No increase in right ventricular wall thickness. Right ventricular systolic function is moderately reduced. There is moderately elevated pulmonary artery systolic pressure. The tricuspid regurgitant velocity is 3.18 m/s, and with an assumed right atrial pressure of 8 mmHg, the estimated right ventricular systolic pressure is 48.4 mmHg. Left Atrium: Left atrial size was normal in size. Right Atrium: Right atrial size was normal in size. Pericardium: Trivial pericardial effusion is present. Mitral Valve: The mitral valve is normal in structure. Mild to moderate mitral valve regurgitation. Tricuspid Valve: The tricuspid valve is normal in structure. Tricuspid valve regurgitation is mild. Aortic Valve: The aortic valve is grossly normal. Aortic valve regurgitation is not visualized. Pulmonic Valve: The pulmonic valve was normal in structure. Pulmonic valve regurgitation is trivial. Aorta: The aortic root is normal in size and structure. There is borderline dilatation of the ascending aorta, measuring 38 mm. Venous: The inferior vena cava is normal in size with less than 50% respiratory variability, suggesting right atrial pressure of 8 mmHg. IAS/Shunts: No atrial level shunt detected by color flow Doppler.  LEFT VENTRICLE PLAX 2D LVOT diam:     2.00 cm LV SV:         44 LV SV Index:   24 LVOT Area:     3.14 cm  LV Volumes (MOD) LV vol d, MOD A2C: 62.4 ml LV vol d, MOD A4C: 72.9 ml LV vol s, MOD A2C: 51.6 ml LV vol s, MOD A4C: 54.6 ml LV SV MOD A2C:     10.8 ml LV SV MOD A4C:     72.9 ml LV SV MOD BP:      12.2 ml RIGHT VENTRICLE RV S prime:     10.00 cm/s TAPSE (M-mode): 1.0 cm LEFT ATRIUM             Index        RIGHT ATRIUM          Index LA Vol (A2C):   36.9 ml 20.03 ml/m  RA Area:     8.23 cm LA Vol (A4C):   24.8 ml 13.46 ml/m  RA Volume:    15.30 ml 8.30 ml/m LA Biplane Vol: 30.4 ml 16.50 ml/m  AORTIC VALVE LVOT Vmax:   101.00 cm/s LVOT Vmean:  74.100 cm/s LVOT VTI:    0.139 m  AORTA Ao Root diam: 3.20 cm Ao Asc diam:  3.80 cm TRICUSPID VALVE TR Peak grad:   40.4 mmHg TR Vmax:        318.00 cm/s  SHUNTS Systemic VTI:  0.14 m Systemic Diam: 2.00 cm Arvilla Meres MD Electronically signed by Arvilla Meres MD Signature Date/Time: 01/25/2023/11:36:46 AM    Final    DG Chest Port 1 View  Result Date: 01/17/2023 CLINICAL DATA:  Intubation. EXAM: PORTABLE CHEST 1 VIEW COMPARISON:  Chest x-ray dated August 21, 2022. FINDINGS: The patient is rotated to the right. Endotracheal tube tip in good position 3.2 cm above the carina. Enteric tube tip seen in the lower neck, possibly within the right piriform sinus. The heart  size and mediastinal contours are within normal limits. Normal pulmonary vascularity. No focal consolidation, pleural effusion, or pneumothorax. No acute osseous abnormality. IMPRESSION: 1. Endotracheal tube tip in good position. 2. Enteric tube tip seen in the lower neck, possibly within the right piriform sinus. Recommend repositioning. 3. No active disease. Electronically Signed   By: Obie Dredge M.D.   On: 02/06/2023 11:09   DG Abd Portable 1V  Result Date: 02/14/2023 CLINICAL DATA:  Enteric tube placement. EXAM: PORTABLE ABDOMEN - 1 VIEW COMPARISON:  CT abdomen pelvis dated August 05, 2022. FINDINGS: No enteric tube identified in the field of view. Markedly distended stomach. IMPRESSION: 1. No enteric tube identified. Markedly distended stomach. Electronically Signed   By: Obie Dredge M.D.   On: 01/23/2023 11:07     Assessment and Plan:   Shandy Mindlin is a 67 y.o. female with a hx of nonobstructive CAD, COPD/asthma, DM, pancreatitis, GERD, HTN who is being seen 01/24/2023 for the evaluation of cardiac arrest at the request of Dr. Manus Gunning.  Cardiac Arrest Asystole -- Underwent cardiac catheterization 2018 with  mild nonobstructive disease.  Recent stress test 09/2021 as well as echocardiogram through [facility] cardiology which were normal. -- Developed upper back pain with bilateral arm tingling last evening.  While in the EMS truck had seizure-like activity followed by asystole.  Received CPR and multiple rounds of epi with ROSC achieved after 15 minutes.  -- lactic 8.4 -- Intubated, sedated at present -- follow for neurologic recovery, will need ischemic evaluation pending status  Acute hypoxic respiratory failure 2/2 cardiac arrest Hx of COPD -- intubated, management per PCCM  Acute HFrEF -- Echocardiogram with LVEF of 20-25%, with multiple wall motion abnormalities -- concerning for ischemic event prior to admission, as above plan for evaluation pending neurological status  -- will need eventual GDMT  Tachycardia -- serial ekgs notable for sinus tachycardia vs an atrial tachy (atrial flutter?) -- appeared to be WCT on telemetry but follow up EKG reads as an SVT, though question atrial flutter -- will add IV amio, IV heparin  Hypokalemia Hypocalcemia -- supp  Elevated LFTs -- in the setting of cardiac arrest -- trend  DM -- glucose 517 -- check Hgb A1c -- per primary  Leukocytosis -- stress response? -- per primary  For questions or updates, please contact [facility] Please consult [email or web address] for contact info under    Signed, Laverda Page, NP  02/04/2023 1:50 PM  Personally seen and examined. Agree with APP above with the following comments:  Briefly 67 year old female with a history of COPD, no obstructive CAD, HTN who presented after prolonged arrest.   She sees Dr. Rayetta Pigg as per primary cardiologist. She has had multiple stressful events prior to presentation; three friends and her ex-husband died within the last month; her ex-husband died last 2023-03-08. She had been complaining about arm pain to her daughter.  In messages found on her found post arrest, she  had also been complain of several days of chest pain and shortness of breath with pain that radiated to her arm.  She had reached her cardiology office and was advised to have ED assessment.  She was able to walk out to EMS truck.  While in the ambulace she had PEA arrest; she have 14 minutes of CPR. Lactate of 8.  At first evaluation.  Patient was intubated and sedated.  Had WCT persistently at 135.  She had abnormal P waves consistent with SVT.  She was started  on amiodarone.  Hemodynamically stable, intubated and sedated.  She had a CT reviewed with no evidence of a bleed; heparin was planned. She had a CTPE; on review there was no evidence of VSD.   Echo showed LVEF 20% with mild to moderate MR and LAD territory WMA.  At second evaluated, daughter and family at beside.  Tachycardia has resolved, RBBB remained.  She was breathing over the ventilator.  We and discussed weaning her and post arrest evaluation of obstructive CAD (consented for LHC).  EEG performed.  Central line placed.  At third evaluation, s/p PEA arrest.  Largest suspicion is for completed LAD infarct.  Family meeting for consideration or LHC +/- IABP and inotropes.  In discussing with family and PCCM; at the time of COVID-19 she had discussed that she would not have wanted aggressive measures.  At this time we will continue amiodarone and restart heparin post CXR. If she weans off and does well will re-engage about LHC. At this time not escalating therapy.  CRITICAL CARE Performed by: Zeth Buday A Abhijot Straughter  Total critical care time: 60 minutes. Critical care time was exclusive of separately billable procedures and treating other patients. Critical care was necessary to treat or prevent imminent or life-threatening deterioration. Critical care was time spent personally by me on the following activities: development of treatment plan with patient and/or surrogate as well as nursing, discussions with consultants, evaluation of  patient's response to treatment, examination of patient, obtaining history from patient or surrogate, ordering and performing treatments and interventions, ordering and review of laboratory studies, ordering and review of radiographic studies, pulse oximetry and re-evaluation of patient's condition.    Signed, Riley Lam, MD FASE [facility]  [facility]  01/25/2023 4:42 PM

## 2023-02-09 NOTE — Progress Notes (Signed)
Patient not stable enough for EEG currently (Irregular cardiac rhythm). Will check back with RN when schedule permits.

## 2023-02-09 NOTE — Progress Notes (Signed)
   I responded to [employer]  67 y/o woman with poorly controlled DM2, COPD, non-obstructive CAD admitted after cardiac arrest.  Apparently had several days of stuttering angina which she did not seek care for.   Had asystolic arrest with EMS. Had 13 mins of CPR. Prior to ROSC.   Echo EF 25-30% with RWMA. Hstrop 39 -> 688  ECG Sinus with RBBB and lateral Qs   Taken to [facility] on vent. Concern for possible anoxic injury.   While I was in the CCU she developed PEA arrest. Received immediate CPR and epi. Regained ROSC in ~   I discussed case with Drs. Vassie Loll and Cosmos regarding potential cath with mechanical support (IABP) to help with hemodynamic stability and potential neuro improvement.   However, Dr. Vassie Loll d/w family and they were clear that Ms. Fadness would not want this type of support.   She is now DNR.  The HF/shock team will not follow going forward but will be available to re-engage as needed.   CRITICAL CARE Performed by: Arvilla Meres  Total critical care time: 35 minutes  Critical care time was exclusive of separately billable procedures and treating other patients.  Critical care was necessary to treat or prevent imminent or life-threatening deterioration.  Critical care was time spent personally by me (independent of midlevel providers or residents) on the following activities: development of treatment plan with patient and/or surrogate as well as nursing, discussions with consultants, evaluation of patient's response to treatment, examination of patient, obtaining history from patient or surrogate, ordering and performing treatments and interventions, ordering and review of laboratory studies, ordering and review of radiographic studies, pulse oximetry and re-evaluation of patient's condition.  Arvilla Meres, MD  6:56 PM

## 2023-02-09 NOTE — Procedures (Addendum)
Central Venous Catheter Insertion Procedure Note  Yesenia Wilson  [id number]  11/17/1955  Date:01/29/2023  Time:2:20 PM   Provider Performing:Brooke Lodema Hong   Procedure: Insertion of Non-tunneled Central Venous Catheter(36556) with US guidance (56213)   Indication(s) Medication administration and Difficult access  Consent Unable to obtain consent due to emergent nature of procedure.  Anesthesia Topical only with 1% lidocaine   Timeout Verified patient identification, verified procedure, site/side was marked, verified correct patient position, special equipment/implants available, medications/allergies/relevant history reviewed, required imaging and test results available.  Sterile Technique Maximal sterile technique including full sterile barrier drape, hand hygiene, sterile gown, sterile gloves, mask, hair covering, sterile ultrasound probe cover (if used).  Procedure Description Area of catheter insertion was cleaned with chlorhexidine and draped in sterile fashion.  With real-time ultrasound guidance a central venous catheter was placed into the left internal jugular vein. Nonpulsatile blood flow and easy flushing noted in all ports.  The catheter was sutured in place and sterile dressing applied.  One set of blood cultures obtained during sterile procedure given difficult IV stick    Complications/Tolerance None; patient tolerated the procedure well. Chest X-ray is ordered to verify placement for internal jugular or subclavian cannulation.   Chest x-ray is not ordered for femoral cannulation.  EBL Minimal  Specimen(s) None     Posey Boyer, MSN, AG-ACNP-BC [facility] 02/06/2023, 2:21 PM  See Amion for pager If no response to pager, please call PCCM consult pager After 7:00 pm call Elink

## 2023-02-09 NOTE — Progress Notes (Signed)
Ventilator patient transported from ED to 2H20 without any complications.

## 2023-02-09 NOTE — ED Triage Notes (Signed)
Pt BIB [facility] as a CPR in progress. EMS initially called out for numbness and tingling in bilat upper extremities.  LKW 2100 at bedtime last night. Pt was A&O x4 VSS at beginning of transport.  En route pt gave out a scream, presented with tonic/clonic seizure like activity approx. 20 sec. Then lost pulses.  EMS performed approx. 14 min of CPR with 3 rounds of EPI and obtained ROSC.  Pt had ROSC for approx. 22 min on arrival to ED. PT had King airway placed on arrival with successful ETT intubation in ED. Pt was given 2mg  Versed and 25 mcg of fentanyl en route for combative behavior. Best VSS post arrest for EMS were 82/44 HR 70.  EMs endorsed hx of DM2 and cardiac hx.

## 2023-02-09 NOTE — Progress Notes (Signed)
Echocardiogram 2D Echocardiogram has been performed.  Warren Lacy Lutricia Widjaja RDCS 01/23/2023, 11:06 AM  Dr Gasper Lloyd notified for stat read

## 2023-02-09 NOTE — Progress Notes (Addendum)
STAT EEG complete. Results pending °

## 2023-02-09 NOTE — ED Provider Notes (Signed)
EMERGENCY DEPARTMENT AT [facility] Provider Note   CSN: [id number] Arrival date & time: 02/14/2023  1007     History  No chief complaint on file.   Yesenia Wilson is a 67 y.o. female.  Level 5 caveat, patient unresponsive status post CPR.  EMS reports patient called herself for "bilateral arm tingling" since about midnight last night.  She is stable vital signs and stable blood sugar initially.  She was able to walk to the truck without any assistance.  While sitting in the truck patient had "seizure activity" for about 15 seconds and then was noted to be asystole.  CPR was immediately begun.  Patient received multiple doses of epinephrine, LMA was placed, CPR performed for about 14 minutes before return of spontaneous circulation.  EMS reports pulses back now for 25 minutes.  She denied any chest pain or shortness of breath.  Her initial EKG was a right bundle branch block without acute ST changes.  She was able to walk to the truck on her own.  She has stable vitals and a normal blood sugar initially.  The history is provided by the EMS personnel. The history is limited by the condition of the patient.       Home Medications Prior to Admission medications   Medication Sig Start Date End Date Taking? Authorizing Provider  BD PEN NEEDLE NANO U/F 32G X 4 MM MISC USE 3 PER TO DAY TO INJECT NOVOLOG 01/18/17   [provider]  Collagen-Vitamin C-Biotin (COLLAGEN 1500/C PO) Take by mouth.    [provider]  DULoxetine (CYMBALTA) 60 MG capsule TAKE 1 CAPSULE (60 MG TOTAL) BY MOUTH DAILY. 12/17/17   Burchette, Elberta Fortis, MD  ezetimibe (ZETIA) 10 MG tablet Take 10 mg by mouth daily.    [provider]  hydrochlorothiazide (MICROZIDE) 12.5 MG capsule Take 12.5 mg by mouth daily.    [provider]  ibuprofen (ADVIL) 800 MG tablet Take 1 tablet (800 mg total) by mouth every 8 (eight) hours as needed. 11/13/22   Adline Potter, NP  Insulin Pen  Needle (BD PEN NEEDLE NANO U/F) 32G X 4 MM MISC Use as directed to inject insulin 4 times daily. 06/18/18   Reather Littler, MD  insulin regular human CONCENTRATED (HUMULIN R) 500 UNIT/ML injection Fill insulin pump with 2 mL every 3 days Patient taking differently: Inject 60 Units into the skin 3 (three) times daily with meals. 08/06/18   Reather Littler, MD  JARDIANCE 10 MG TABS tablet Take 25 mg by mouth daily. 06/05/19   [provider]  levothyroxine (SYNTHROID) 150 MCG tablet Take 200 mcg by mouth daily before breakfast.    [provider]  Multiple Vitamins-Minerals (MULTIVITAMIN WOMEN 50+ PO) Take 1 tablet by mouth daily.    [provider]  [facility] DELICA LANCETS 33G MISC USE TO CHECK BLOOD SUGAR 3 TIMES PER DAY. DX CODE E11.9 04/26/18   Reather Littler, MD  [facility] VERIO test strip USE TO TEST BLOOD SUGAR 3 TIMES A DAY DX CODE E11.9 02/24/19   Reather Littler, MD  pantoprazole (PROTONIX) 40 MG tablet TAKE 1 TABLET BY MOUTH TWICE A DAY Patient taking differently: Take 40 mg by mouth 2 (two) times daily. 03/25/18   Meryl Dare, MD  rosuvastatin (CRESTOR) 40 MG tablet Take 40 mg by mouth daily.    [provider]  valACYclovir (VALTREX) 1000 MG tablet Take 1 tablet (1,000 mg total) by mouth 2 (two) times  daily. 12/25/22   Adline Potter, NP  verapamil (VERELAN PM) 240 MG 24 hr capsule Take 240 mg by mouth at bedtime.    [provider]      Allergies    Other    Review of Systems   Review of Systems  Unable to perform ROS: Patient unresponsive    Physical Exam Updated Vital Signs BP (!) 186/113   Pulse (!) 139   Resp 16   SpO2 100%  Physical Exam Constitutional:      General: She is in acute distress.     Appearance: She is obese. She is ill-appearing and toxic-appearing.     Comments: Unresponsive, some spontaneous breathing over the ventilator  HENT:     Head: Normocephalic and atraumatic.  Eyes:     Comments: 6 mm bilaterally, reactive   Cardiovascular:     Rate and Rhythm: Regular rhythm. Tachycardia present.  Pulmonary:     Effort: No respiratory distress.     Comments: Equal breath sounds with bagging Chest:     Chest wall: No tenderness.  Abdominal:     General: There is distension.     Tenderness: There is no abdominal tenderness.  Musculoskeletal:        General: No swelling or tenderness.  Neurological:     Comments: Unresponsive, some twitching of fingers bilaterally     ED Results / Procedures / Treatments   Labs (all labs ordered are listed, but only abnormal results are displayed) Labs Reviewed  COMPREHENSIVE METABOLIC PANEL - Abnormal; Notable for the following components:      Result Value   Potassium 3.2 (*)    CO2 16 (*)    Glucose, Bld 517 (*)    Creatinine, Ser 1.28 (*)    Calcium 8.7 (*)    Total Protein 6.4 (*)    Albumin 3.3 (*)    AST 215 (*)    ALT 161 (*)    GFR, Estimated 46 (*)    Anion gap 20 (*)    All other components within normal limits  LACTIC ACID, PLASMA - Abnormal; Notable for the following components:   Lactic Acid, Venous 8.6 (*)    All other components within normal limits  CBC - Abnormal; Notable for the following components:   WBC 25.5 (*)    All other components within normal limits  PHOSPHORUS - Abnormal; Notable for the following components:   Phosphorus 5.3 (*)    All other components within normal limits  GLUCOSE, CAPILLARY - Abnormal; Notable for the following components:   Glucose-Capillary 568 (*)    All other components within normal limits  CBG MONITORING, ED - Abnormal; Notable for the following components:   Glucose-Capillary 434 (*)    All other components within normal limits  I-STAT CHEM 8, ED - Abnormal; Notable for the following components:   Sodium 134 (*)    Potassium 3.3 (*)    Glucose, Bld 531 (*)    Calcium, Ion 0.84 (*)    TCO2 16 (*)    All other components within normal limits  I-STAT ARTERIAL BLOOD GAS, ED - Abnormal; Notable  for the following components:   pH, Arterial 7.188 (*)    pCO2 arterial 57.4 (*)    Acid-base deficit 7.0 (*)    Potassium 2.8 (*)    All other components within normal limits  I-STAT VENOUS BLOOD GAS, ED - Abnormal; Notable for the following components:   pCO2, Ven 18.4 (*)  pO2, Ven 209 (*)    Bicarbonate 11.7 (*)    TCO2 12 (*)    Acid-base deficit 10.0 (*)    Sodium 132 (*)    Potassium 3.2 (*)    Calcium, Ion 0.81 (*)    Hemoglobin 15.3 (*)    All other components within normal limits  POCT I-STAT 7, (LYTES, BLD GAS, ICA,H+H) - Abnormal; Notable for the following components:   pH, Arterial 7.234 (*)    pCO2 arterial 54.6 (*)    pO2, Arterial 147 (*)    Acid-base deficit 5.0 (*)    HCT 48.0 (*)    Hemoglobin 16.3 (*)    All other components within normal limits  TROPONIN I (HIGH SENSITIVITY) - Abnormal; Notable for the following components:   Troponin I (High Sensitivity) 39 (*)    All other components within normal limits  CULTURE, BLOOD (ROUTINE X 2)  CULTURE, BLOOD (ROUTINE X 2)  CULTURE, RESPIRATORY W GRAM STAIN  APTT  PROTIME-INR  MAGNESIUM  PROCALCITONIN  BLOOD GAS, ARTERIAL  LACTIC ACID, PLASMA  LACTIC ACID, PLASMA  RAPID URINE DRUG SCREEN, HOSP PERFORMED  TRIGLYCERIDES  URINALYSIS, W/ REFLEX TO CULTURE (INFECTION SUSPECTED)  BETA-HYDROXYBUTYRIC ACID  BETA-HYDROXYBUTYRIC ACID  HIV ANTIBODY (ROUTINE TESTING W REFLEX)  LIPASE, BLOOD  ACETAMINOPHEN LEVEL  HEPARIN LEVEL (UNFRACTIONATED)  COOXEMETRY PANEL  BASIC METABOLIC PANEL  BASIC METABOLIC PANEL  BASIC METABOLIC PANEL  BETA-HYDROXYBUTYRIC ACID  GLUCOSE, RANDOM  LACTIC ACID, PLASMA  CBG MONITORING, ED  CBG MONITORING, ED  TYPE AND SCREEN  ABO/RH  TROPONIN I (HIGH SENSITIVITY)    EKG EKG Interpretation  Date/Time:  Friday Feb 09 2023 11:09:38 EDT Ventricular Rate:  111 PR Interval:  134 QRS Duration: 128 QT Interval:  443 QTC Calculation: 603 R Axis:   121 Text Interpretation: Sinus  tachycardia Right bundle branch block Probable anterolateral infarct, recent Abnormal T, consider ischemia, inferior leads Baseline wander in lead(s) I III aVR aVL V3 V4 V5 V6 sinus tachy, anterior Q waves with some ST elevation Confirmed by Glynn Octave [id number]) on 02/02/2023 11:38:34 AM  Radiology EEG adult  Result Date:  Rejeana Brock, MD     02/07/2023  3:20 PM History: 67 year old female status post cardiac arrest Sedation: Precedex, fentanyl Technique: This EEG was acquired with electrodes placed according to the International 10-20 electrode system (including Fp1, Fp2, F3, F4, C3, C4, P3, P4, O1, O2, T3, T4, T5, T6, A1, A2, Fz, Cz, Pz). The following electrodes were missing or displaced: none. Background: The background is relatively attenuated, with an absence of faster frequencies but there is some low voltage irregular generalized delta seen. Photic stimulation: Physiologic driving is now performed EEG Abnormalities: 1) generalized irregular slow activity 2) absent posterior dominant rhythm Clinical Interpretation: This EEG is consistent with a generalized nonspecific cerebral dysfunction (encephalopathy). There was no seizure or seizure predisposition recorded on this study. Please note that lack of epileptiform activity on EEG does not preclude the possibility of epilepsy. Ritta Slot, MD [facility] [phone] If 7pm- 7am, please page neurology on call as listed in AMION.   DG CHEST PORT 1 VIEW  Result Date: 01/24/2023 CLINICAL DATA:  Central line placement. EXAM: PORTABLE CHEST 1 VIEW COMPARISON:  Same day. FINDINGS: Interval placement of left internal jugular catheter with distal tip in expected position of SVC. No pneumothorax is noted. Endotracheal tube is in good position. Nasogastric tube is seen entering stomach. IMPRESSION: Interval placement of left internal jugular catheter with distal tip in  expected position of the SVC. Electronically Signed    By: Lupita Raider M.D.   On: 01/21/2023 14:49   CT Angio Chest/Abd/Pel for Dissection W and/or Wo Contrast  Result Date: 02/14/2023 CLINICAL DATA:  Status post cardiac arrest.  Acute aortic syndrome. EXAM: CT ANGIOGRAPHY CHEST, ABDOMEN AND PELVIS TECHNIQUE: Non-contrast CT of the chest was initially obtained. Multidetector CT imaging through the chest, abdomen and pelvis was performed using the standard protocol during bolus administration of intravenous contrast. Multiplanar reconstructed images and MIPs were obtained and reviewed to evaluate the vascular anatomy. RADIATION DOSE REDUCTION: This exam was performed according to the departmental dose-optimization program which includes automated exposure control, adjustment of the mA and/or kV according to patient size and/or use of iterative reconstruction technique. CONTRAST:  OMNIPAQUE IOHEXOL 350 MG/ML SOLN COMPARISON:  August 05, 2022. FINDINGS: CTA CHEST FINDINGS Cardiovascular: Preferential opacification of the thoracic aorta. No evidence of thoracic aortic aneurysm or dissection. Normal heart size. No pericardial effusion. Mediastinum/Nodes: Endotracheal tube is in grossly good position. Thyroid gland is not well visualized. No adenopathy is noted. Mildly dilated upper esophagus is noted containing food debris. Lungs/Pleura: No pneumothorax or pleural effusion is noted. Mild bilateral posterior basilar subsegmental atelectasis is noted. Musculoskeletal: Mildly displaced fracture is seen involving the anterior portion of the left third rib Review of the MIP images confirms the above findings. CTA ABDOMEN AND PELVIS FINDINGS VASCULAR Aorta: Atherosclerosis of abdominal aorta is noted without aneurysm or dissection. Celiac: Moderate stenosis is noted proximally with poststenotic dilatation. SMA: Patent without evidence of aneurysm, dissection, vasculitis or significant stenosis. Renals: Both renal arteries are patent without evidence of aneurysm,  dissection, vasculitis, fibromuscular dysplasia or significant stenosis. IMA: Patent without evidence of aneurysm, dissection, vasculitis or significant stenosis. Inflow: Patent without evidence of aneurysm, dissection, vasculitis or significant stenosis. Veins: No obvious venous abnormality within the limitations of this arterial phase study. Review of the MIP images confirms the above findings. NON-VASCULAR Hepatobiliary: No focal liver abnormality is seen. Status post cholecystectomy. No biliary dilatation. Pancreas: Unremarkable. No pancreatic ductal dilatation or surrounding inflammatory changes. Spleen: Normal in size without focal abnormality. Adrenals/Urinary Tract: Adrenal glands and kidneys appear normal. No hydronephrosis or renal obstruction is noted. Urinary bladder is decompressed secondary to Foley catheter. Stomach/Bowel: Severe gastric distention is noted. There is no evidence of large or small bowel obstruction or inflammation. Status post appendectomy. Lymphatic: No adenopathy is noted. Reproductive: Status post hysterectomy. No adnexal masses. Other: No abdominal wall hernia or abnormality. No abdominopelvic ascites. Musculoskeletal: No acute or significant osseous findings. Review of the MIP images confirms the above findings. IMPRESSION: No evidence of thoracic or abdominal aortic dissection or aneurysm. Mildly displaced left third rib fracture. Mild bilateral posterior basilar subsegmental atelectasis. Mildly dilated proximal esophagus containing food debris. Severe gastric distention is noted. Gastric outlet obstruction cannot be excluded. Moderate stenosis noted at origin of celiac artery. Aortic Atherosclerosis (ICD10-I70.0). Electronically Signed   By: Lupita Raider M.D.   On: 02/06/2023 12:57   DG Abd Portable 1 View  Result Date: 01/30/2023 CLINICAL DATA:  67 year old female status post CPR. Enteric tube placement. EXAM: PORTABLE ABDOMEN - 1 VIEW COMPARISON:  Head CT today. CTA  chest abdomen and pelvis reported separately. Portable chest 1036 hours today. FINDINGS: Portable AP supine view at 1203 hours. Mildly rotated to the right. Endotracheal tube tip above the clavicles. Enteric tube courses into the left upper quadrant, tip and side hole not included. Stable ventilation. IMPRESSION: 1. Enteric  tube placed into the stomach, tip not included. NOTE that on Head CT the tube was looped in the nasopharynx before continuing inferiorly. Reposition that segment if possible. 2. Stable ventilation, see CTA reported separately. Electronically Signed   By: Odessa Fleming M.D.   On: 02/07/2023 12:16   CT HEAD WO CONTRAST  Result Date: 02/03/2023 CLINICAL DATA:  67 year old female status post CPR. EXAM: CT HEAD WITHOUT CONTRAST TECHNIQUE: Contiguous axial images were obtained from the base of the skull through the vertex without intravenous contrast. RADIATION DOSE REDUCTION: This exam was performed according to the departmental dose-optimization program which includes automated exposure control, adjustment of the mA and/or kV according to patient size and/or use of iterative reconstruction technique. COMPARISON:  Head CT 03/31/2020. FINDINGS: Brain: Cerebral volume remains normal for age. No midline shift, ventriculomegaly, mass effect, evidence of mass lesion, intracranial hemorrhage or evidence of cortically based acute infarction. Possible small area of chronic encephalomalacia in the inferior right occipital lobe, versus normal sulcus variation series 3, image 11. Elsewhere gray-white differentiation is within normal limits for age. Vascular: Calcified atherosclerosis at the skull base. No suspicious intracranial vascular hyperdensity. Skull: No acute osseous abnormality identified. Sinuses/Orbits: Intubated. Fluid in the visible pharynx and nasal cavity. Mild paranasal sinus mucosal thickening and fluid. Tympanic cavities and mastoids remain well aerated. Other: Enteric tube loops in the pharynx.  No acute orbit or scalp soft tissue finding. IMPRESSION: 1. No acute intracranial abnormality. Negative aside from questionable small focus of chronic encephalomalacia in the right occipital lobe. 2. Enteric tube loops in the pharynx recommend repositioning. Intubated. Electronically Signed   By: Odessa Fleming M.D.   On: 01/27/2023 12:14   ECHOCARDIOGRAM COMPLETE  Result Date: 01/18/2023    ECHOCARDIOGRAM REPORT   Patient Name:   BRICELYN FEIST Date of Exam: 01/18/2023 Medical Rec #:  [id number]     Height:       63.0 in Accession #:    [id number]    Weight:       178.5 lb Date of Birth:  April 25, 1956    BSA:          1.842 m Patient Age:    66 years      BP:           139/90 mmHg Patient Gender: F             HR:           160 bpm. Exam Location:  Inpatient Procedure: 2D Echo, Color Doppler, Cardiac Doppler and Intracardiac            Opacification Agent STAT ECHO Indications:    CMO unspecified i24.9  History:        Patient has prior history of Echocardiogram examinations, most                 recent 09/21/2021. Risk Factors:Hypertension, Diabetes and                 Dyslipidemia.  Sonographer:    Irving Burton Senior RDCS Referring Phys: [phone] [facility]  Sonographer Comments: Scanned supine on artificial respirator. IMPRESSIONS  1. Left ventricular ejection fraction, by estimation, is 20 to 25%. The left ventricle has severely decreased function. The left ventricle demonstrates regional wall motion abnormalities (see scoring diagram/findings for description). There is mild left  ventricular hypertrophy. Left ventricular diastolic parameters are indeterminate.  2. Right ventricular systolic function is moderately reduced. The right ventricular size is normal. There is moderately elevated pulmonary artery  systolic pressure.  3. The mitral valve is normal in structure. Mild to moderate mitral valve regurgitation.  4. The aortic valve is grossly normal. Aortic valve regurgitation is not visualized.  5. There is borderline  dilatation of the ascending aorta, measuring 38 mm.  6. The inferior vena cava is normal in size with <50% respiratory variability, suggesting right atrial pressure of 8 mmHg. FINDINGS  Left Ventricle: Left ventricular ejection fraction, by estimation, is 20 to 25%. The left ventricle has severely decreased function. The left ventricle demonstrates regional wall motion abnormalities. Definity contrast agent was given IV to delineate the left ventricular endocardial borders. The left ventricular internal cavity size was normal in size. There is mild left ventricular hypertrophy. Left ventricular diastolic parameters are indeterminate.  LV Wall Scoring: The mid and distal anterior septum, mid inferolateral segment, mid inferoseptal segment, apical inferior segment, and apex are akinetic. The entire anterior wall, antero-lateral wall, inferior wall, basal anteroseptal segment, apical lateral segment, and basal inferoseptal segment are hypokinetic. Right Ventricle: The right ventricular size is normal. No increase in right ventricular wall thickness. Right ventricular systolic function is moderately reduced. There is moderately elevated pulmonary artery systolic pressure. The tricuspid regurgitant velocity is 3.18 m/s, and with an assumed right atrial pressure of 8 mmHg, the estimated right ventricular systolic pressure is 48.4 mmHg. Left Atrium: Left atrial size was normal in size. Right Atrium: Right atrial size was normal in size. Pericardium: Trivial pericardial effusion is present. Mitral Valve: The mitral valve is normal in structure. Mild to moderate mitral valve regurgitation. Tricuspid Valve: The tricuspid valve is normal in structure. Tricuspid valve regurgitation is mild. Aortic Valve: The aortic valve is grossly normal. Aortic valve regurgitation is not visualized. Pulmonic Valve: The pulmonic valve was normal in structure. Pulmonic valve regurgitation is trivial. Aorta: The aortic root is normal in size and  structure. There is borderline dilatation of the ascending aorta, measuring 38 mm. Venous: The inferior vena cava is normal in size with less than 50% respiratory variability, suggesting right atrial pressure of 8 mmHg. IAS/Shunts: No atrial level shunt detected by color flow Doppler.  LEFT VENTRICLE PLAX 2D LVOT diam:     2.00 cm LV SV:         44 LV SV Index:   24 LVOT Area:     3.14 cm  LV Volumes (MOD) LV vol d, MOD A2C: 62.4 ml LV vol d, MOD A4C: 72.9 ml LV vol s, MOD A2C: 51.6 ml LV vol s, MOD A4C: 54.6 ml LV SV MOD A2C:     10.8 ml LV SV MOD A4C:     72.9 ml LV SV MOD BP:      12.2 ml RIGHT VENTRICLE RV S prime:     10.00 cm/s TAPSE (M-mode): 1.0 cm LEFT ATRIUM             Index        RIGHT ATRIUM          Index LA Vol (A2C):   36.9 ml 20.03 ml/m  RA Area:     8.23 cm LA Vol (A4C):   24.8 ml 13.46 ml/m  RA Volume:   15.30 ml 8.30 ml/m LA Biplane Vol: 30.4 ml 16.50 ml/m  AORTIC VALVE LVOT Vmax:   101.00 cm/s LVOT Vmean:  74.100 cm/s LVOT VTI:    0.139 m  AORTA Ao Root diam: 3.20 cm Ao Asc diam:  3.80 cm TRICUSPID VALVE TR Peak grad:   40.4 mmHg  TR Vmax:        318.00 cm/s  SHUNTS Systemic VTI:  0.14 m Systemic Diam: 2.00 cm Arvilla Meres MD Electronically signed by Arvilla Meres MD Signature Date/Time: 01/23/2023/11:36:46 AM    Final    DG Chest Port 1 View  Result Date: 02/15/2023 CLINICAL DATA:  Intubation. EXAM: PORTABLE CHEST 1 VIEW COMPARISON:  Chest x-ray dated August 21, 2022. FINDINGS: The patient is rotated to the right. Endotracheal tube tip in good position 3.2 cm above the carina. Enteric tube tip seen in the lower neck, possibly within the right piriform sinus. The heart size and mediastinal contours are within normal limits. Normal pulmonary vascularity. No focal consolidation, pleural effusion, or pneumothorax. No acute osseous abnormality. IMPRESSION: 1. Endotracheal tube tip in good position. 2. Enteric tube tip seen in the lower neck, possibly within the right piriform sinus.  Recommend repositioning. 3. No active disease. Electronically Signed   By: Obie Dredge M.D.   On: 02/14/2023 11:09   DG Abd Portable 1V  Result Date: 01/20/2023 CLINICAL DATA:  Enteric tube placement. EXAM: PORTABLE ABDOMEN - 1 VIEW COMPARISON:  CT abdomen pelvis dated August 05, 2022. FINDINGS: No enteric tube identified in the field of view. Markedly distended stomach. IMPRESSION: 1. No enteric tube identified. Markedly distended stomach. Electronically Signed   By: Obie Dredge M.D.   On: 01/27/2023 11:07    Procedures .Critical Care  Performed by: Glynn Octave, MD Authorized by: Glynn Octave, MD   Critical care provider statement:    Critical care time (minutes):  45   Critical care time was exclusive of:  Separately billable procedures and treating other patients   Critical care was necessary to treat or prevent imminent or life-threatening deterioration of the following conditions:  Cardiac failure, respiratory failure and CNS failure or compromise   Critical care was time spent personally by me on the following activities:  Development of treatment plan with patient or surrogate, discussions with consultants, evaluation of patient's response to treatment, examination of patient, ordering and review of laboratory studies, ordering and review of radiographic studies, ordering and performing treatments and interventions, pulse oximetry, re-evaluation of patient's condition, review of old charts, blood draw for specimens and obtaining history from patient or surrogate   I assumed direction of critical care for this patient from another provider in my specialty: no   Procedure Name: Intubation Date/Time: 01/22/2023 10:29 AM  Performed by: Glynn Octave, MDPre-anesthesia Checklist: Patient identified, Patient being monitored, Emergency Drugs available, Timeout performed and Suction available Oxygen Delivery Method: Non-rebreather mask Preoxygenation: Pre-oxygenation with  100% oxygen Induction Type: Rapid sequence Ventilation: Mask ventilation without difficulty Laryngoscope Size: Glidescope and 4 Grade View: Grade II Tube size: 7.5 mm Number of attempts: 1 Airway Equipment and Method: Video-laryngoscopy, Bougie stylet and Patient positioned with wedge pillow Placement Confirmation: ETT inserted through vocal cords under direct vision, CO2 detector and Breath sounds checked- equal and bilateral Secured at: 25 cm Tube secured with: ETT holder Dental Injury: Teeth and Oropharynx as per pre-operative assessment  Difficulty Due To: Difficulty was anticipated, Difficult Airway- due to large tongue and Difficult Airway- due to limited oral opening Future Recommendations: Recommend- induction with short-acting agent, and alternative techniques readily available        Medications Ordered in ED Medications  etomidate (AMIDATE) injection (20 mg Intravenous Given 01/17/2023 1016)  rocuronium (ZEMURON) injection (80 mg Intravenous Given 01/29/2023 1016)  docusate (COLACE) 50 MG/5ML liquid 100 mg (has no administration in time range)  polyethylene glycol (MIRALAX / GLYCOLAX) packet 17 g (has no administration in time range)  fentaNYL (SUBLIMAZE) injection 25 mcg (has no administration in time range)  fentaNYL in NS (50mcg/ml) infusion-PREMIX (has no administration in time range)  fentaNYL (SUBLIMAZE) bolus via infusion 25-100 mcg (has no administration in time range)  midazolam (VERSED) injection 1-2 mg (has no administration in time range)  lactated ringers bolus 1,000 mL (has no administration in time range)  norepinephrine (LEVOPHED) 4mg  in (0.016 mg/mL) premix infusion (has no administration in time range)  fentaNYL (SUBLIMAZE) injection 25 mcg (has no administration in time range)  fentaNYL (SUBLIMAZE) injection 25-100 mcg (has no administration in time range)  midazolam (VERSED) injection 1-2 mg (has no administration in time range)    ED  Course/ Medical Decision Making/ A&P                             Medical Decision Making Amount and/or Complexity of Data Reviewed Independent Historian: EMS Labs: ordered. Decision-making details documented in ED Course. Radiology: ordered and independent interpretation performed. Decision-making details documented in ED Course. ECG/medicine tests: ordered and independent interpretation performed. Decision-making details documented in ED Course.  Risk OTC drugs. Prescription drug management. Decision regarding hospitalization.  Status postcardiac arrest witnessed by EMS.  Initial rhythm was asystole.  Pulses have returned after approximately 14 minutes of ACLS.  On arrival she was unresponsive.  EKG is sinus tachycardia without acute ST elevation  LMA was exchanged for endotracheal tube.  Chest x-ray shows widened mediastinum with concern for possible aortic pathology.  Labs show hyperglycemia with probable DKA and bicarb of 16.  She IV fluids and IV insulin .  Labs show lactic acidosis, bicarb of 16, hyperglycemia with concern for DKA.  Bedside echocardiogram shows EF 20 to 25% however.  Will judiciously give IV fluids.  Continue IV insulin.  Given patient's widening mediastinum on chest x-ray, CT angiogram was obtained.  This shows no aortic dissection.  Or obvious pulmonary embolism.  CT head is negative for hemorrhage.  A serial EKG shows sinus tachycardia versus atrial flutter.  No acute ST elevation identified.  Does have Q waves anteriorly.  Discussed with cardiology who will evaluate patient.  ICU admission discussed with critical care team.       Final Clinical Impression(s) / ED Diagnoses Final diagnoses:  Cardiac arrest [facility])    Rx / DC Orders ED Discharge Orders     None         Jamesen Stahnke, Jeannett Senior, MD 02/05/2023 1553

## 2023-02-09 NOTE — Procedures (Signed)
History: 67 year old female status post cardiac arrest  Sedation: Precedex, fentanyl  Technique: This EEG was acquired with electrodes placed according to the International 10-20 electrode system (including Fp1, Fp2, F3, F4, C3, C4, P3, P4, O1, O2, T3, T4, T5, T6, A1, A2, Fz, Cz, Pz). The following electrodes were missing or displaced: none.   Background: The background is relatively attenuated, with an absence of faster frequencies but there is some low voltage irregular generalized delta seen.  Photic stimulation: Physiologic driving is now performed  EEG Abnormalities: 1) generalized irregular slow activity 2) absent posterior dominant rhythm  Clinical Interpretation: This EEG is consistent with a generalized nonspecific cerebral dysfunction (encephalopathy).  There was no seizure or seizure predisposition recorded on this study. Please note that lack of epileptiform activity on EEG does not preclude the possibility of epilepsy.   Ritta Slot, MD [facility] [phone]  If 7pm- 7am, please page neurology on call as listed in AMION.

## 2023-02-09 NOTE — Progress Notes (Signed)
   01/21/2023 1644  Spiritual Encounters  Care provided to: Family  Conversation partners present during encounter Nurse;Physician  Referral source Code page  Reason for visit Code  OnCall Visit No   Mirna Mires responded to Code Blue and assisted with family in the hallway.  PT sister and daughter we jsut arriving as code team was working.  Supported family as the DR spoke with them about their options for care and provided encouragement for the decisions made.  Mirna Mires also helped conduct more family in the waiting area to the bed side where we spoke about the PT's life and passions.  Familyu told Chaplain that PT was a caring, giving person who ran herself ragged helping and serving others.  Family asked chap to pray before leaving.

## 2023-02-09 NOTE — Progress Notes (Signed)
ANTICOAGULATION CONSULT NOTE - Initial Consult  Pharmacy Consult for heparin Indication: chest pain/ACS  Allergies  Allergen Reactions   Other Other (See Comments)    Packing used during nose surgery : Shock    Patient Measurements: Height: 5\' 3"  (160 cm) Weight: 80 kg (176 lb 5.9 oz) IBW/kg (Calculated) : 52.4 Heparin Dosing Weight: 80kg  Vital Signs: Temp: 96.7 F (35.9 C) (05/24 1145) Temp Source: Axillary (05/24 1145) BP: 153/102 (05/24 1215) Pulse Rate: 133 (05/24 1215)  Labs: Recent Labs    02/04/2023 1015 02/01/2023 1040 01/29/2023 1057 01/21/2023 1145 02/08/2023 1334  HGB 14.3 13.9 15.3* 13.9 16.3*  HCT 43.5 41.0 45.0 41.0 48.0*  PLT 328  --   --   --   --   APTT 24  --   --   --   --   LABPROT 14.1  --   --   --   --   INR 1.1  --   --   --   --   CREATININE 1.28* 1.00  --   --   --   TROPONINIHS 39*  --   --   --   --     Estimated Creatinine Clearance: 53.9 mL/min (by C-G formula based on SCr of 1 mg/dL).   Medical History: Past Medical History:  Diagnosis Date   Anxiety    Asthma    Clotting disorder (HCC)    DVT left leg between ankle and knee due to tennis injury   COPD (chronic obstructive pulmonary disease) (HCC)    Depression    Diabetes mellitus    DVT (deep venous thrombosis) (HCC)    left leg   Fibromyalgia    GERD (gastroesophageal reflux disease)    Heart murmur    Herpes simplex virus (HSV) infection    Hiatal hernia    Hyperlipidemia    Hypertension    Hypothyroidism    Lupus (HCC)    Neuromuscular disorder (HCC)    fibromyalgia   Pancreatitis    Pneumonia       Assessment: 78 yoF admitted s/p cardiac arrest with concern for ACS. Pharmacy to dose IV heparin. No AC PTA. Will defer bolus with recent line placement and CPR.  Goal of Therapy:  Heparin level 0.3-0.7 units/ml Monitor platelets by anticoagulation protocol: Yes   Plan:  -Heparin 1150 units/hr -Check 6-hr heparin level -Monitor heparin level, CBC, S/Sx bleeding  daily  Fredonia Highland, PharmD, BCPS, [facility] Clinical Pharmacist [phone] Please check AMION for all [facility] Pharmacy numbers 01/19/2023

## 2023-02-09 NOTE — Progress Notes (Signed)
Responded to CODE BLUE. Brief PEA arrest, back in rhythm by the time I arrived at bedside, being bagged. Cardiology at bedside, discussed options of mechanical support for cardiogenic shock.  No benefit for cardiac cath in this situation especially with neuroprognosis unclear  Goals of care discussion with daughter and son.  Daughter expresses that during COVID she had expressed that she did not want mechanical ventilation or CPR.  She expressed that she would certainly not want balloon pump or advanced forms of life support.  Daughter is in healthcare and understands that without these interventions, she may die but prefers for mom to die with dignity.  We issued DNR order.  We will Pressors at current levels and not escalate.  Continue mechanical ventilation for now, further interventions will be based on neuroprognosis over the next 24 to 48 hours if she survives.  If she does worse on the other hand, family would be amenable to withdrawal of life support  My additional critical care time was 25 minutes  Yesenia Wilson V. Vassie Loll MD
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
## 2023-02-09 NOTE — Progress Notes (Signed)
Responded to page to support patient that experienced CPR from home. Chaplain provided support to patient  and  staff.  Chaplain available as needed.  Venida Jarvis, [place], [facility], Pager [phone]

## 2023-02-09 NOTE — Procedures (Signed)
Arterial Catheter Insertion Procedure Note  Katilyn Dangelo  [id number]  03-Nov-1955  Date:02/06/2023  Time:1:34 PM    Provider Performing: Reatha Harps    Procedure: Insertion of Arterial Line (40981) without US guidance  Indication(s) Blood pressure monitoring and/or need for frequent ABGs  Consent Unable to obtain consent due to emergent nature of procedure.  Anesthesia None   Time Out Verified patient identification, verified procedure, site/side was marked, verified correct patient position, special equipment/implants available, medications/allergies/relevant history reviewed, required imaging and test results available.   Sterile Technique Maximal sterile technique including full sterile barrier drape, hand hygiene, sterile gown, sterile gloves, mask, hair covering, sterile ultrasound probe cover (if used).   Procedure Description Area of catheter insertion was cleaned with chlorhexidine and draped in sterile fashion. Without real-time ultrasound guidance an arterial catheter was placed into the left radial artery.  Appropriate arterial tracings confirmed on monitor.     Complications/Tolerance None; patient tolerated the procedure well.   EBL Minimal   Specimen(s) None

## 2023-02-10 ENCOUNTER — Inpatient Hospital Stay (HOSPITAL_COMMUNITY): Payer: 59

## 2023-02-10 DIAGNOSIS — Z9911 Dependence on respirator [ventilator] status: Secondary | ICD-10-CM

## 2023-02-10 DIAGNOSIS — J9601 Acute respiratory failure with hypoxia: Secondary | ICD-10-CM

## 2023-02-10 DIAGNOSIS — J189 Pneumonia, unspecified organism: Secondary | ICD-10-CM

## 2023-02-10 DIAGNOSIS — I469 Cardiac arrest, cause unspecified: Secondary | ICD-10-CM | POA: Diagnosis not present

## 2023-02-10 LAB — COMPREHENSIVE METABOLIC PANEL
ALT: 115 U/L — ABNORMAL HIGH (ref 0–44)
AST: 87 U/L — ABNORMAL HIGH (ref 15–41)
Albumin: 2.9 g/dL — ABNORMAL LOW (ref 3.5–5.0)
Alkaline Phosphatase: 80 U/L (ref 38–126)
Anion gap: 10 (ref 5–15)
BUN: 33 mg/dL — ABNORMAL HIGH (ref 8–23)
CO2: 21 mmol/L — ABNORMAL LOW (ref 22–32)
Calcium: 8.2 mg/dL — ABNORMAL LOW (ref 8.9–10.3)
Chloride: 103 mmol/L (ref 98–111)
Creatinine, Ser: 1.21 mg/dL — ABNORMAL HIGH (ref 0.44–1.00)
GFR, Estimated: 49 mL/min — ABNORMAL LOW (ref >60)
Glucose, Bld: 278 mg/dL — ABNORMAL HIGH (ref 70–99)
Potassium: 3.4 mmol/L — ABNORMAL LOW (ref 3.5–5.1)
Sodium: 134 mmol/L — ABNORMAL LOW (ref 135–145)
Total Bilirubin: 0.7 mg/dL (ref 0.3–1.2)
Total Protein: 6.3 g/dL — ABNORMAL LOW (ref 6.5–8.1)

## 2023-02-10 LAB — BLOOD CULTURE ID PANEL (REFLEXED) - BCID2

## 2023-02-10 LAB — POCT I-STAT 7, (LYTES, BLD GAS, ICA,H+H)
Acid-base deficit: 7 mmol/L — ABNORMAL HIGH (ref 0.0–2.0)
Bicarbonate: 19.4 mmol/L — ABNORMAL LOW (ref 20.0–28.0)
Calcium, Ion: 1.18 mmol/L (ref 1.15–1.40)
HCT: 41 % (ref 36.0–46.0)
Hemoglobin: 13.9 g/dL (ref 12.0–15.0)
O2 Saturation: 97 %
Patient temperature: 38.3
Potassium: 3.9 mmol/L (ref 3.5–5.1)
Sodium: 136 mmol/L (ref 135–145)
TCO2: 21 mmol/L — ABNORMAL LOW (ref 22–32)
pCO2 arterial: 43.7 mmHg (ref 32–48)
pH, Arterial: 7.262 — ABNORMAL LOW (ref 7.35–7.45)
pO2, Arterial: 106 mmHg (ref 83–108)

## 2023-02-10 LAB — CULTURE, RESPIRATORY W GRAM STAIN

## 2023-02-10 LAB — BASIC METABOLIC PANEL
Anion gap: 13 (ref 5–15)
Anion gap: 10 (ref 5–15)
BUN: 35 mg/dL — ABNORMAL HIGH (ref 8–23)
BUN: 30 mg/dL — ABNORMAL HIGH (ref 8–23)
CO2: 19 mmol/L — ABNORMAL LOW (ref 22–32)
CO2: 20 mmol/L — ABNORMAL LOW (ref 22–32)
Calcium: 8.2 mg/dL — ABNORMAL LOW (ref 8.9–10.3)
Calcium: 8.1 mg/dL — ABNORMAL LOW (ref 8.9–10.3)
Chloride: 103 mmol/L (ref 98–111)
Chloride: 101 mmol/L (ref 98–111)
Creatinine, Ser: 1.19 mg/dL — ABNORMAL HIGH (ref 0.44–1.00)
Creatinine, Ser: 1.05 mg/dL — ABNORMAL HIGH (ref 0.44–1.00)
GFR, Estimated: 50 mL/min — ABNORMAL LOW (ref >60)
GFR, Estimated: 58 mL/min — ABNORMAL LOW (ref >60)
Glucose, Bld: 337 mg/dL — ABNORMAL HIGH (ref 70–99)
Glucose, Bld: 140 mg/dL — ABNORMAL HIGH (ref 70–99)
Potassium: 3.3 mmol/L — ABNORMAL LOW (ref 3.5–5.1)
Potassium: 4 mmol/L (ref 3.5–5.1)
Sodium: 135 mmol/L (ref 135–145)
Sodium: 131 mmol/L — ABNORMAL LOW (ref 135–145)

## 2023-02-10 LAB — GLUCOSE, CAPILLARY
Glucose-Capillary: 117 mg/dL — ABNORMAL HIGH (ref 70–99)
Glucose-Capillary: 120 mg/dL — ABNORMAL HIGH (ref 70–99)
Glucose-Capillary: 133 mg/dL — ABNORMAL HIGH (ref 70–99)
Glucose-Capillary: 143 mg/dL — ABNORMAL HIGH (ref 70–99)
Glucose-Capillary: 151 mg/dL — ABNORMAL HIGH (ref 70–99)
Glucose-Capillary: 168 mg/dL — ABNORMAL HIGH (ref 70–99)
Glucose-Capillary: 173 mg/dL — ABNORMAL HIGH (ref 70–99)
Glucose-Capillary: 180 mg/dL — ABNORMAL HIGH (ref 70–99)
Glucose-Capillary: 181 mg/dL — ABNORMAL HIGH (ref 70–99)
Glucose-Capillary: 192 mg/dL — ABNORMAL HIGH (ref 70–99)
Glucose-Capillary: 195 mg/dL — ABNORMAL HIGH (ref 70–99)
Glucose-Capillary: 198 mg/dL — ABNORMAL HIGH (ref 70–99)
Glucose-Capillary: 238 mg/dL — ABNORMAL HIGH (ref 70–99)
Glucose-Capillary: 248 mg/dL — ABNORMAL HIGH (ref 70–99)
Glucose-Capillary: 273 mg/dL — ABNORMAL HIGH (ref 70–99)
Glucose-Capillary: 282 mg/dL — ABNORMAL HIGH (ref 70–99)
Glucose-Capillary: 291 mg/dL — ABNORMAL HIGH (ref 70–99)
Glucose-Capillary: 327 mg/dL — ABNORMAL HIGH (ref 70–99)
Glucose-Capillary: 400 mg/dL — ABNORMAL HIGH (ref 70–99)

## 2023-02-10 LAB — COOXEMETRY PANEL
Carboxyhemoglobin: 1 % (ref 0.5–1.5)
Methemoglobin: <0.7 % (ref 0.0–1.5)
O2 Saturation: 72.9 %
Total hemoglobin: 13.2 g/dL (ref 12.0–16.0)

## 2023-02-10 LAB — MAGNESIUM: Magnesium: 1.5 mg/dL — ABNORMAL LOW (ref 1.7–2.4)

## 2023-02-10 LAB — BETA-HYDROXYBUTYRIC ACID: Beta-Hydroxybutyric Acid: 0.11 mmol/L (ref 0.05–0.27)

## 2023-02-10 LAB — TRIGLYCERIDES: Triglycerides: 110 mg/dL (ref <150)

## 2023-02-10 MED ORDER — MIDAZOLAM-SODIUM CHLORIDE 100-0.9 MG/100ML-% IV SOLN
0.0000 mg/h | INTRAVENOUS | Status: DC
  Administered 2023-02-10: 1 mg/h via INTRAVENOUS
  Filled 2023-02-10: qty 100

## 2023-02-10 MED ORDER — MIDAZOLAM BOLUS VIA INFUSION: 0.0000 mg | INTRAVENOUS | Status: DC | PRN

## 2023-02-10 MED ORDER — ATORVASTATIN CALCIUM 80 MG PO TABS
80.0000 mg | ORAL_TABLET | Freq: Every day | ORAL | Status: DC
  Administered 2023-02-10 – 2023-02-12 (×3): 80 mg
  Filled 2023-02-10 (×3): qty 1

## 2023-02-10 MED ORDER — POTASSIUM CHLORIDE 20 MEQ PO PACK
20.0000 meq | PACK | ORAL | Status: AC
  Administered 2023-02-10 (×2): 20 meq
  Filled 2023-02-10 (×2): qty 1

## 2023-02-10 MED ORDER — POLYETHYLENE GLYCOL 3350 17 G PO PACK
17.0000 g | PACK | Freq: Every day | ORAL | Status: DC
  Administered 2023-02-11: 17 g
  Filled 2023-02-10: qty 1

## 2023-02-10 MED ORDER — POTASSIUM CHLORIDE 10 MEQ/50ML IV SOLN
10.0000 meq | INTRAVENOUS | Status: AC
  Administered 2023-02-10 (×4): 10 meq via INTRAVENOUS
  Filled 2023-02-10 (×4): qty 50

## 2023-02-10 MED ORDER — MAGNESIUM SULFATE 4 GM/100ML IV SOLN
4.0000 g | Freq: Once | INTRAVENOUS | Status: AC
  Administered 2023-02-10: 4 g via INTRAVENOUS
  Filled 2023-02-10: qty 100

## 2023-02-10 MED ORDER — DOCUSATE SODIUM 50 MG/5ML PO LIQD
100.0000 mg | Freq: Two times a day (BID) | ORAL | Status: DC
  Administered 2023-02-10 – 2023-02-11 (×2): 100 mg
  Filled 2023-02-10: qty 10

## 2023-02-10 MED ORDER — SODIUM CHLORIDE 0.9 % IV SOLN
2.0000 g | INTRAVENOUS | Status: DC
  Administered 2023-02-10 – 2023-02-11 (×2): 2 g via INTRAVENOUS
  Filled 2023-02-10 (×2): qty 20

## 2023-02-10 MED ORDER — ASPIRIN 81 MG PO CHEW
81.0000 mg | CHEWABLE_TABLET | Freq: Every day | ORAL | Status: DC
  Administered 2023-02-10 – 2023-02-12 (×3): 81 mg
  Filled 2023-02-10 (×3): qty 1

## 2023-02-10 MED ORDER — INSULIN REGULAR(HUMAN) IN NACL 100-0.9 UT/100ML-% IV SOLN
INTRAVENOUS | Status: DC
  Administered 2023-02-10: 3.4 [IU]/h via INTRAVENOUS
  Administered 2023-02-11: 4 [IU]/h via INTRAVENOUS
  Administered 2023-02-12: 11 [IU]/h via INTRAVENOUS
  Filled 2023-02-10 (×3): qty 100

## 2023-02-10 NOTE — Progress Notes (Addendum)
eLink Physician-Brief Progress Note Patient Name: Smitha Claywell DOB: [birth year] MRN: [id number]   Date of Service  02/10/2023  HPI/Events of Note  Mag low at 1.5, K at 4, Cr 1.05  eICU Interventions  Electrolyte replacement ordered     Intervention Category Intermediate Interventions: Electrolyte abnormality - evaluation and management  Ranee Gosselin 02/10/2023, 7:28 PM  20:23  Camera; Discussed with RN. Asynchrony with vent, ABG stat showing resp and metabolic acidosis, Hco3 at 19. No need for Hco3 IV sats ok.  - on fenta 200, getting prn Versed often. She is DNR. On Levophed.s/p PEA arrest.   - Versed drip ordered for now.

## 2023-02-10 NOTE — Progress Notes (Signed)
Progress Note  Patient Name: Yesenia Wilson Date of Encounter: 02/10/2023  Primary Cardiologist: None   Subjective   Overnight able to wean pressors. Intubated and sedated  Inpatient Medications    Scheduled Meds:  aspirin  81 mg Per Tube Daily   atorvastatin  80 mg Per Tube Daily   Chlorhexidine Gluconate Cloth  6 each Topical Daily   docusate  100 mg Per Tube BID   polyethylene glycol  17 g Per Tube Daily   Continuous Infusions:  amiodarone 30 mg/hr (02/10/23 1200)   cefTRIAXone (ROCEPHIN)  IV Stopped (02/10/23 1159)   dexmedetomidine (PRECEDEX) IV infusion Stopped ( 1607)   dextrose 5% lactated ringers     fentaNYL infusion INTRAVENOUS 50 mcg/hr (02/10/23 1200)   insulin 8 Units/hr (02/10/23 1224)   norepinephrine (LEVOPHED) Adult infusion 11 mcg/min (02/10/23 1200)   PRN Meds: dextrose, fentaNYL, midazolam   Vital Signs    Vitals:   02/10/23 1130 02/10/23 1145 02/10/23 1200 02/10/23 1215  BP:      Pulse: 83 84 86 89  Resp: (!) 27 (!) 31 (!) 27 (!) 24  Temp: 99.3 F (37.4 C) 99 F (37.2 C) 99.1 F (37.3 C) 99.1 F (37.3 C)  TempSrc:      SpO2: 97% 94% 93% 93%  Weight:      Height:        Intake/Output Summary (Last 24 hours) at 02/10/2023 1226 Last data filed at 02/10/2023 1224 Gross per 24 hour  Intake 2927.31 ml  Output 3165 ml  Net -237.69 ml   Filed Weights   02/08/2023 1311 02/10/23 0500  Weight: 80 kg 81.3 kg    Telemetry    SR RBBB - Personally Reviewed  Physical Exam   Gen: Intubated and sedated Cardiac: No Rubs or Gallops, systolic murmur, RRR  Respiratory: Mechanical breath sounds, tachypnea GI: Soft, nontender, non-distended  MS: No edema; extremities cool to touch  Labs    Chemistry Recent Labs  Lab 02/16/2023 1015 01/18/2023 1040 01/29/2023 2150 02/10/23 0300 02/10/23 0436  NA 136   < > 131* 135 134*  K 3.2*   < > 4.3 3.3* 3.4*  CL 100   < > 101 103 103  CO2 16*   < > 19* 19* 21*  GLUCOSE 517*   < > 543* 337*  278*  BUN 19   < > 34* 35* 33*  CREATININE 1.28*   < > 1.40* 1.19* 1.21*  CALCIUM 8.7*   < > 8.0* 8.2* 8.2*  PROT 6.4*  --   --   --  6.3*  ALBUMIN 3.3*  --   --   --  2.9*  AST 215*  --   --   --  87*  ALT 161*  --   --   --  115*  ALKPHOS 102  --   --   --  80  BILITOT 0.9  --   --   --  0.7  GFRNONAA 46*   < > 41* 50* 49*  ANIONGAP 20*   < > 11 13 10    < > = values in this interval not displayed.     Hematology Recent Labs  Lab 01/19/2023 1015 02/01/2023 1040 02/11/2023 1057 01/22/2023 1145 02/08/2023 1334  WBC 25.5*  --   --   --   --   RBC 4.61  --   --   --   --   HGB 14.3   < > 15.3* 13.9 16.3*  HCT 43.5   < > 45.0 41.0 48.0*  MCV 94.4  --   --   --   --   MCH 31.0  --   --   --   --   MCHC 32.9  --   --   --   --   RDW 13.1  --   --   --   --   PLT 328  --   --   --   --    < > = values in this interval not displayed.    Cardiac EnzymesNo results for input(s): "TROPONINI" in the last 168 hours. No results for input(s): "TROPIPOC" in the last 168 hours.   BNPNo results for input(s): "BNP", "PROBNP" in the last 168 hours.   DDimer No results for input(s): "DDIMER" in the last 168 hours.   Radiology    DG Chest Port 1 View  Result Date: 02/10/2023 CLINICAL DATA:  Cardiac arrest EXAM: PORTABLE CHEST 1 VIEW COMPARISON:  Yesterday FINDINGS: Endotracheal tube with tip just below the clavicular heads. Enteric tube at least reaches the stomach. Left IJ line with tip at the SVC. Normal heart size. Improved aeration especially about the right hilum; atelectasis seen on recent chest CT. No visible effusion or pneumothorax. IMPRESSION: 1. Unremarkable hardware. 2. Mild improvement in atelectasis. Electronically Signed   By: Tiburcio Pea M.D.   On: 02/10/2023 06:34   DG CHEST PORT 1 VIEW  Result Date: 01/28/2023 CLINICAL DATA:  Cardiac arrest EXAM: PORTABLE CHEST 1 VIEW COMPARISON:  01/23/2023 FINDINGS: The patient is rotated to the right on today's radiograph, reducing diagnostic  sensitivity and specificity. Endotracheal tube tip 2.8 cm above the carina. Left IJ line tip: SVC. Suspected right perihilar and infrahilar airspace opacity, although obscured by the rightward rotation. The left lung appears clear. No blunting of the costophrenic angles. IMPRESSION: 1. Suspected right perihilar and infrahilar airspace opacity, although obscured by the patient's rightward rotation. 2. Endotracheal tube tip 2.8 cm above the carina. Left IJ line tip: SVC. Electronically Signed   By: Gaylyn Rong M.D.   On: 01/20/2023 16:59   EEG adult  Result Date: 02/16/2023 Rejeana Brock, MD     01/27/2023  3:20 PM History: 67 year old female status post cardiac arrest Sedation: Precedex, fentanyl Technique: This EEG was acquired with electrodes placed according to the International 10-20 electrode system (including Fp1, Fp2, F3, F4, C3, C4, P3, P4, O1, O2, T3, T4, T5, T6, A1, A2, Fz, Cz, Pz). The following electrodes were missing or displaced: none. Background: The background is relatively attenuated, with an absence of faster frequencies but there is some low voltage irregular generalized delta seen. Photic stimulation: Physiologic driving is now performed EEG Abnormalities: 1) generalized irregular slow activity 2) absent posterior dominant rhythm Clinical Interpretation: This EEG is consistent with a generalized nonspecific cerebral dysfunction (encephalopathy). There was no seizure or seizure predisposition recorded on this study. Please note that lack of epileptiform activity on EEG does not preclude the possibility of epilepsy. Ritta Slot, MD [facility] [phone] If 7pm- 7am, please page neurology on call as listed in AMION.   DG CHEST PORT 1 VIEW  Result Date: 01/17/2023 CLINICAL DATA:  Central line placement. EXAM: PORTABLE CHEST 1 VIEW COMPARISON:  Same day. FINDINGS: Interval placement of left internal jugular catheter with distal tip in expected position of  SVC. No pneumothorax is noted. Endotracheal tube is in good position. Nasogastric tube is seen entering stomach. IMPRESSION: Interval placement of left  internal jugular catheter with distal tip in expected position of the SVC. Electronically Signed   By: Lupita Raider M.D.   On: 02/07/2023 14:49   CT Angio Chest/Abd/Pel for Dissection W and/or Wo Contrast  Result Date: 01/28/2023 CLINICAL DATA:  Status post cardiac arrest.  Acute aortic syndrome. EXAM: CT ANGIOGRAPHY CHEST, ABDOMEN AND PELVIS TECHNIQUE: Non-contrast CT of the chest was initially obtained. Multidetector CT imaging through the chest, abdomen and pelvis was performed using the standard protocol during bolus administration of intravenous contrast. Multiplanar reconstructed images and MIPs were obtained and reviewed to evaluate the vascular anatomy. RADIATION DOSE REDUCTION: This exam was performed according to the departmental dose-optimization program which includes automated exposure control, adjustment of the mA and/or kV according to patient size and/or use of iterative reconstruction technique. CONTRAST:  OMNIPAQUE IOHEXOL 350 MG/ML SOLN COMPARISON:  August 05, 2022. FINDINGS: CTA CHEST FINDINGS Cardiovascular: Preferential opacification of the thoracic aorta. No evidence of thoracic aortic aneurysm or dissection. Normal heart size. No pericardial effusion. Mediastinum/Nodes: Endotracheal tube is in grossly good position. Thyroid gland is not well visualized. No adenopathy is noted. Mildly dilated upper esophagus is noted containing food debris. Lungs/Pleura: No pneumothorax or pleural effusion is noted. Mild bilateral posterior basilar subsegmental atelectasis is noted. Musculoskeletal: Mildly displaced fracture is seen involving the anterior portion of the left third rib Review of the MIP images confirms the above findings. CTA ABDOMEN AND PELVIS FINDINGS VASCULAR Aorta: Atherosclerosis of abdominal aorta is noted without aneurysm  or dissection. Celiac: Moderate stenosis is noted proximally with poststenotic dilatation. SMA: Patent without evidence of aneurysm, dissection, vasculitis or significant stenosis. Renals: Both renal arteries are patent without evidence of aneurysm, dissection, vasculitis, fibromuscular dysplasia or significant stenosis. IMA: Patent without evidence of aneurysm, dissection, vasculitis or significant stenosis. Inflow: Patent without evidence of aneurysm, dissection, vasculitis or significant stenosis. Veins: No obvious venous abnormality within the limitations of this arterial phase study. Review of the MIP images confirms the above findings. NON-VASCULAR Hepatobiliary: No focal liver abnormality is seen. Status post cholecystectomy. No biliary dilatation. Pancreas: Unremarkable. No pancreatic ductal dilatation or surrounding inflammatory changes. Spleen: Normal in size without focal abnormality. Adrenals/Urinary Tract: Adrenal glands and kidneys appear normal. No hydronephrosis or renal obstruction is noted. Urinary bladder is decompressed secondary to Foley catheter. Stomach/Bowel: Severe gastric distention is noted. There is no evidence of large or small bowel obstruction or inflammation. Status post appendectomy. Lymphatic: No adenopathy is noted. Reproductive: Status post hysterectomy. No adnexal masses. Other: No abdominal wall hernia or abnormality. No abdominopelvic ascites. Musculoskeletal: No acute or significant osseous findings. Review of the MIP images confirms the above findings. IMPRESSION: No evidence of thoracic or abdominal aortic dissection or aneurysm. Mildly displaced left third rib fracture. Mild bilateral posterior basilar subsegmental atelectasis. Mildly dilated proximal esophagus containing food debris. Severe gastric distention is noted. Gastric outlet obstruction cannot be excluded. Moderate stenosis noted at origin of celiac artery. Aortic Atherosclerosis (ICD10-I70.0). Electronically  Signed   By: Lupita Raider M.D.   On: 02/11/2023 12:57   DG Abd Portable 1 View  Result Date: 02/05/2023 CLINICAL DATA:  67 year old female status post CPR. Enteric tube placement. EXAM: PORTABLE ABDOMEN - 1 VIEW COMPARISON:  Head CT today. CTA chest abdomen and pelvis reported separately. Portable chest 1036 hours today. FINDINGS: Portable AP supine view at 1203 hours. Mildly rotated to the right. Endotracheal tube tip above the clavicles. Enteric tube courses into the left upper quadrant, tip and side hole not  included. Stable ventilation. IMPRESSION: 1. Enteric tube placed into the stomach, tip not included. NOTE that on Head CT the tube was looped in the nasopharynx before continuing inferiorly. Reposition that segment if possible. 2. Stable ventilation, see CTA reported separately. Electronically Signed   By: Odessa Fleming M.D.   On: 02/02/2023 12:16   CT HEAD WO CONTRAST  Result Date: 02/05/2023 CLINICAL DATA:  67 year old female status post CPR. EXAM: CT HEAD WITHOUT CONTRAST TECHNIQUE: Contiguous axial images were obtained from the base of the skull through the vertex without intravenous contrast. RADIATION DOSE REDUCTION: This exam was performed according to the departmental dose-optimization program which includes automated exposure control, adjustment of the mA and/or kV according to patient size and/or use of iterative reconstruction technique. COMPARISON:  Head CT 03/31/2020. FINDINGS: Brain: Cerebral volume remains normal for age. No midline shift, ventriculomegaly, mass effect, evidence of mass lesion, intracranial hemorrhage or evidence of cortically based acute infarction. Possible small area of chronic encephalomalacia in the inferior right occipital lobe, versus normal sulcus variation series 3, image 11. Elsewhere gray-white differentiation is within normal limits for age. Vascular: Calcified atherosclerosis at the skull base. No suspicious intracranial vascular hyperdensity. Skull: No acute  osseous abnormality identified. Sinuses/Orbits: Intubated. Fluid in the visible pharynx and nasal cavity. Mild paranasal sinus mucosal thickening and fluid. Tympanic cavities and mastoids remain well aerated. Other: Enteric tube loops in the pharynx. No acute orbit or scalp soft tissue finding. IMPRESSION: 1. No acute intracranial abnormality. Negative aside from questionable small focus of chronic encephalomalacia in the right occipital lobe. 2. Enteric tube loops in the pharynx recommend repositioning. Intubated. Electronically Signed   By: Odessa Fleming M.D.   On: 02/02/2023 12:14   ECHOCARDIOGRAM COMPLETE  Result Date: 02/04/2023    ECHOCARDIOGRAM REPORT   Patient Name:   Yesenia Wilson Date of Exam: 02/05/2023 Medical Rec #:  [id number]     Height:       63.0 in Accession #:    [id number]    Weight:       178.5 lb Date of Birth:  [birth year]    BSA:          1.842 m Patient Age:    66 years      BP:           139/90 mmHg Patient Gender: F             HR:           160 bpm. Exam Location:  Inpatient Procedure: 2D Echo, Color Doppler, Cardiac Doppler and Intracardiac            Opacification Agent STAT ECHO Indications:    CMO unspecified i24.9  History:        Patient has prior history of Echocardiogram examinations, most                 recent 09/21/2021. Risk Factors:Hypertension, Diabetes and                 Dyslipidemia.  Sonographer:    Irving Burton Senior RDCS Referring Phys: [phone] [facility]  Sonographer Comments: Scanned supine on artificial respirator. IMPRESSIONS  1. Left ventricular ejection fraction, by estimation, is 20 to 25%. The left ventricle has severely decreased function. The left ventricle demonstrates regional wall motion abnormalities (see scoring diagram/findings for description). There is mild left  ventricular hypertrophy. Left ventricular diastolic parameters are indeterminate.  2. Right ventricular systolic function is moderately reduced. The right ventricular size is normal.  There is  moderately elevated pulmonary artery systolic pressure.  3. The mitral valve is normal in structure. Mild to moderate mitral valve regurgitation.  4. The aortic valve is grossly normal. Aortic valve regurgitation is not visualized.  5. There is borderline dilatation of the ascending aorta, measuring 38 mm.  6. The inferior vena cava is normal in size with <50% respiratory variability, suggesting right atrial pressure of 8 mmHg. FINDINGS  Left Ventricle: Left ventricular ejection fraction, by estimation, is 20 to 25%. The left ventricle has severely decreased function. The left ventricle demonstrates regional wall motion abnormalities. Definity contrast agent was given IV to delineate the left ventricular endocardial borders. The left ventricular internal cavity size was normal in size. There is mild left ventricular hypertrophy. Left ventricular diastolic parameters are indeterminate.  LV Wall Scoring: The mid and distal anterior septum, mid inferolateral segment, mid inferoseptal segment, apical inferior segment, and apex are akinetic. The entire anterior wall, antero-lateral wall, inferior wall, basal anteroseptal segment, apical lateral segment, and basal inferoseptal segment are hypokinetic. Right Ventricle: The right ventricular size is normal. No increase in right ventricular wall thickness. Right ventricular systolic function is moderately reduced. There is moderately elevated pulmonary artery systolic pressure. The tricuspid regurgitant velocity is 3.18 m/s, and with an assumed right atrial pressure of 8 mmHg, the estimated right ventricular systolic pressure is 48.4 mmHg. Left Atrium: Left atrial size was normal in size. Right Atrium: Right atrial size was normal in size. Pericardium: Trivial pericardial effusion is present. Mitral Valve: The mitral valve is normal in structure. Mild to moderate mitral valve regurgitation. Tricuspid Valve: The tricuspid valve is normal in structure. Tricuspid valve  regurgitation is mild. Aortic Valve: The aortic valve is grossly normal. Aortic valve regurgitation is not visualized. Pulmonic Valve: The pulmonic valve was normal in structure. Pulmonic valve regurgitation is trivial. Aorta: The aortic root is normal in size and structure. There is borderline dilatation of the ascending aorta, measuring 38 mm. Venous: The inferior vena cava is normal in size with less than 50% respiratory variability, suggesting right atrial pressure of 8 mmHg. IAS/Shunts: No atrial level shunt detected by color flow Doppler.  LEFT VENTRICLE PLAX 2D LVOT diam:     2.00 cm LV SV:         44 LV SV Index:   24 LVOT Area:     3.14 cm  LV Volumes (MOD) LV vol d, MOD A2C: 62.4 ml LV vol d, MOD A4C: 72.9 ml LV vol s, MOD A2C: 51.6 ml LV vol s, MOD A4C: 54.6 ml LV SV MOD A2C:     10.8 ml LV SV MOD A4C:     72.9 ml LV SV MOD BP:      12.2 ml RIGHT VENTRICLE RV S prime:     10.00 cm/s TAPSE (M-mode): 1.0 cm LEFT ATRIUM             Index        RIGHT ATRIUM          Index LA Vol (A2C):   36.9 ml 20.03 ml/m  RA Area:     8.23 cm LA Vol (A4C):   24.8 ml 13.46 ml/m  RA Volume:   15.30 ml 8.30 ml/m LA Biplane Vol: 30.4 ml 16.50 ml/m  AORTIC VALVE LVOT Vmax:   101.00 cm/s LVOT Vmean:  74.100 cm/s LVOT VTI:    0.139 m  AORTA Ao Root diam: 3.20 cm Ao Asc diam:  3.80 cm TRICUSPID VALVE TR  Peak grad:   40.4 mmHg TR Vmax:        318.00 cm/s  SHUNTS Systemic VTI:  0.14 m Systemic Diam: 2.00 cm Arvilla Meres MD Electronically signed by Arvilla Meres MD Signature Date/Time: 02/01/2023/11:36:46 AM    Final    DG Chest Port 1 View  Result Date: 02/04/2023 CLINICAL DATA:  Intubation. EXAM: PORTABLE CHEST 1 VIEW COMPARISON:  Chest x-ray dated August 21, 2022. FINDINGS: The patient is rotated to the right. Endotracheal tube tip in good position 3.2 cm above the carina. Enteric tube tip seen in the lower neck, possibly within the right piriform sinus. The heart size and mediastinal contours are within normal  limits. Normal pulmonary vascularity. No focal consolidation, pleural effusion, or pneumothorax. No acute osseous abnormality. IMPRESSION: 1. Endotracheal tube tip in good position. 2. Enteric tube tip seen in the lower neck, possibly within the right piriform sinus. Recommend repositioning. 3. No active disease. Electronically Signed   By: Obie Dredge M.D.   On: 01/30/2023 11:09   DG Abd Portable 1V  Result Date: 01/27/2023 CLINICAL DATA:  Enteric tube placement. EXAM: PORTABLE ABDOMEN - 1 VIEW COMPARISON:  CT abdomen pelvis dated August 05, 2022. FINDINGS: No enteric tube identified in the field of view. Markedly distended stomach. IMPRESSION: 1. No enteric tube identified. Markedly distended stomach. Electronically Signed   By: Obie Dredge M.D.   On: 01/26/2023 11:07     Patient Profile     67 y.o. female post arrest   Assessment & Plan     Cardiac Arrest Asystole - PEA arrest X1 witnessed with concern for LAD infarct -- Intubated, sedated at present -- follow for neurologic recovery, will need ischemic evaluation pending status; discussed with daugther and family   Acute hypoxic respiratory failure 2/2 cardiac arrest Hx of COPD -- intubated, management per PCCM   Acute HFrEF -- Echocardiogram with LVEF of 20-25%, with multiple wall motion abnormalities -- will need eventual GDMT, pressor requirement has decreased   WCT Concerning for AFL with wide conduction of her RBBB vs AST Quiescent on IV amiodarone - heparin as tolerated, may switch to PO amiodarone   Hypokalemia Hypocalcemia -- supp   Elevated LFTs -- in the setting of cardiac arrest   DM -- glucose 517 still difficult to control   Leukocytosis On Abx for 1/3 cultures + -- per primary  If makes neurological recovery, would plan for [facility]  CRITICAL CARE Performed by: Joannie Medine A Eryn Krejci  Total critical care time: 30 minutes. Critical care time was exclusive of separately billable procedures and  treating other patients. Critical care was necessary to treat or prevent imminent or life-threatening deterioration. Critical care was time spent personally by me on the following activities: development of treatment plan with patient and/or surrogate as well as nursing, discussions with consultants, evaluation of patient's response to treatment, examination of patient, obtaining history from patient or surrogate, ordering and performing treatments and interventions, ordering and review of laboratory studies, ordering and review of radiographic studies, pulse oximetry and re-evaluation of patient's condition.    Signed, Riley Lam, MD FASE [facility] [place]  [facility]  02/10/2023 12:31 PM     For questions or updates, please contact [facility] Please consult [email or web address] for contact info under Cardiology/STEMI.      Riley Lam, MD FASE [facility] Cardiologist [facility]  [address] [place], [address] [place], [place] [address] [phone]  12:26 PM
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
## 2023-02-10 NOTE — Progress Notes (Signed)
[facility] ADULT ICU REPLACEMENT PROTOCOL   The patient does apply for the [facility] Adult ICU Electrolyte Replacment Protocol based on the criteria listed below:   1.Exclusion criteria: TCTS, ECMO, Dialysis, and Myasthenia Gravis patients 2. Is GFR >/= 30 ml/min? Yes.    Patient's GFR today is 50 3. Is SCr </= 2? Yes.   Patient's SCr is 1.19 mg/dL 4. Did SCr increase >/= 0.5 in 24 hours? No. 5.Pt's weight >40kg  Yes.   6. Abnormal electrolyte(s):   K 3.3  7. Electrolytes replaced per protocol 8.  Call MD STAT for K+ </= 2.5, Phos </= 1, or Mag </= 1 Physician:  A. Doree Fudge Lalena Salas 02/10/2023 5:41 AM

## 2023-02-10 NOTE — Progress Notes (Signed)
PHARMACY - PHYSICIAN COMMUNICATION CRITICAL VALUE ALERT - BLOOD CULTURE IDENTIFICATION (BCID)  Yesenia Wilson is an 67 y.o. female who presented to [facility] on 02/07/2023  Assessment:  59 yof presenting s/p cardiac arrest and seizure, initiated on ceftriaxone for aspiration PNA. Now with 1 of 3 blood culture bottles growing staph epi with methicillin resistance.  Name of physician (or Provider) Contacted: Chestine Spore, L (CCM)  Current antibiotics: ceftriaxone 2g IV q24h  Changes to prescribed antibiotics recommended:  No changes - likely contaminant  Results for orders placed or performed during the hospital encounter of 02/03/2023  Blood Culture ID Panel (Reflexed) (Collected: 01/28/2023  2:00 PM)  Result Value Ref Range   Enterococcus faecalis NOT DETECTED NOT DETECTED   Enterococcus Faecium NOT DETECTED NOT DETECTED   Listeria monocytogenes NOT DETECTED NOT DETECTED   Staphylococcus species DETECTED (A) NOT DETECTED   Staphylococcus aureus (BCID) NOT DETECTED NOT DETECTED   Staphylococcus epidermidis DETECTED (A) NOT DETECTED   Staphylococcus lugdunensis NOT DETECTED NOT DETECTED   Streptococcus species NOT DETECTED NOT DETECTED   Streptococcus agalactiae NOT DETECTED NOT DETECTED   Streptococcus pneumoniae NOT DETECTED NOT DETECTED   Streptococcus pyogenes NOT DETECTED NOT DETECTED   A.calcoaceticus-baumannii NOT DETECTED NOT DETECTED   Bacteroides fragilis NOT DETECTED NOT DETECTED   Enterobacterales NOT DETECTED NOT DETECTED   Enterobacter cloacae complex NOT DETECTED NOT DETECTED   Escherichia coli NOT DETECTED NOT DETECTED   Klebsiella aerogenes NOT DETECTED NOT DETECTED   Klebsiella oxytoca NOT DETECTED NOT DETECTED   Klebsiella pneumoniae NOT DETECTED NOT DETECTED   Proteus species NOT DETECTED NOT DETECTED   Salmonella species NOT DETECTED NOT DETECTED   Serratia marcescens NOT DETECTED NOT DETECTED   Haemophilus influenzae NOT DETECTED NOT DETECTED   Neisseria meningitidis  NOT DETECTED NOT DETECTED   Pseudomonas aeruginosa NOT DETECTED NOT DETECTED   Stenotrophomonas maltophilia NOT DETECTED NOT DETECTED   Candida albicans NOT DETECTED NOT DETECTED   Candida auris NOT DETECTED NOT DETECTED   Candida glabrata NOT DETECTED NOT DETECTED   Candida krusei NOT DETECTED NOT DETECTED   Candida parapsilosis NOT DETECTED NOT DETECTED   Candida tropicalis NOT DETECTED NOT DETECTED   Cryptococcus neoformans/gattii NOT DETECTED NOT DETECTED   Methicillin resistance mecA/C DETECTED (A) NOT DETECTED    Leia Alf, PharmD, BCPS Please check AMION for all [facility] contact numbers Clinical Pharmacist 02/10/2023 11:54 AM

## 2023-02-11 DIAGNOSIS — J9601 Acute respiratory failure with hypoxia: Secondary | ICD-10-CM | POA: Diagnosis not present

## 2023-02-11 DIAGNOSIS — I469 Cardiac arrest, cause unspecified: Secondary | ICD-10-CM | POA: Diagnosis not present

## 2023-02-11 DIAGNOSIS — Z9911 Dependence on respirator [ventilator] status: Secondary | ICD-10-CM | POA: Diagnosis not present

## 2023-02-11 DIAGNOSIS — G9341 Metabolic encephalopathy: Secondary | ICD-10-CM | POA: Diagnosis not present

## 2023-02-11 LAB — GLUCOSE, CAPILLARY
Glucose-Capillary: 121 mg/dL — ABNORMAL HIGH (ref 70–99)
Glucose-Capillary: 136 mg/dL — ABNORMAL HIGH (ref 70–99)
Glucose-Capillary: 139 mg/dL — ABNORMAL HIGH (ref 70–99)
Glucose-Capillary: 145 mg/dL — ABNORMAL HIGH (ref 70–99)
Glucose-Capillary: 148 mg/dL — ABNORMAL HIGH (ref 70–99)
Glucose-Capillary: 155 mg/dL — ABNORMAL HIGH (ref 70–99)
Glucose-Capillary: 157 mg/dL — ABNORMAL HIGH (ref 70–99)
Glucose-Capillary: 159 mg/dL — ABNORMAL HIGH (ref 70–99)
Glucose-Capillary: 163 mg/dL — ABNORMAL HIGH (ref 70–99)
Glucose-Capillary: 167 mg/dL — ABNORMAL HIGH (ref 70–99)
Glucose-Capillary: 169 mg/dL — ABNORMAL HIGH (ref 70–99)
Glucose-Capillary: 183 mg/dL — ABNORMAL HIGH (ref 70–99)
Glucose-Capillary: 184 mg/dL — ABNORMAL HIGH (ref 70–99)
Glucose-Capillary: 191 mg/dL — ABNORMAL HIGH (ref 70–99)
Glucose-Capillary: 197 mg/dL — ABNORMAL HIGH (ref 70–99)
Glucose-Capillary: 205 mg/dL — ABNORMAL HIGH (ref 70–99)
Glucose-Capillary: 214 mg/dL — ABNORMAL HIGH (ref 70–99)
Glucose-Capillary: 299 mg/dL — ABNORMAL HIGH (ref 70–99)
Glucose-Capillary: 346 mg/dL — ABNORMAL HIGH (ref 70–99)

## 2023-02-11 LAB — MAGNESIUM: Magnesium: 2.7 mg/dL — ABNORMAL HIGH (ref 1.7–2.4)

## 2023-02-11 LAB — CBC
HCT: 38.6 % (ref 36.0–46.0)
Hemoglobin: 12.9 g/dL (ref 12.0–15.0)
MCH: 30.1 pg (ref 26.0–34.0)
MCHC: 33.4 g/dL (ref 30.0–36.0)
MCV: 90.2 fL (ref 80.0–100.0)
Platelets: 236 10*3/uL (ref 150–400)
RBC: 4.28 MIL/uL (ref 3.87–5.11)
RDW: 13.4 % (ref 11.5–15.5)
WBC: 18.8 10*3/uL — ABNORMAL HIGH (ref 4.0–10.5)
nRBC: 0 % (ref 0.0–0.2)

## 2023-02-11 LAB — MRSA NEXT GEN BY PCR, NASAL: MRSA by PCR Next Gen: DETECTED — AB

## 2023-02-11 LAB — POCT I-STAT 7, (LYTES, BLD GAS, ICA,H+H)
Acid-base deficit: 3 mmol/L — ABNORMAL HIGH (ref 0.0–2.0)
Bicarbonate: 23.3 mmol/L (ref 20.0–28.0)
Calcium, Ion: 1.18 mmol/L (ref 1.15–1.40)
HCT: 38 % (ref 36.0–46.0)
Hemoglobin: 12.9 g/dL (ref 12.0–15.0)
O2 Saturation: 97 %
Patient temperature: 36.1
Potassium: 3.5 mmol/L (ref 3.5–5.1)
Sodium: 134 mmol/L — ABNORMAL LOW (ref 135–145)
TCO2: 25 mmol/L (ref 22–32)
pCO2 arterial: 43.5 mmHg (ref 32–48)
pH, Arterial: 7.333 — ABNORMAL LOW (ref 7.35–7.45)
pO2, Arterial: 91 mmHg (ref 83–108)

## 2023-02-11 LAB — BASIC METABOLIC PANEL
Anion gap: 7 (ref 5–15)
BUN: 29 mg/dL — ABNORMAL HIGH (ref 8–23)
CO2: 23 mmol/L (ref 22–32)
Calcium: 7.6 mg/dL — ABNORMAL LOW (ref 8.9–10.3)
Chloride: 101 mmol/L (ref 98–111)
Creatinine, Ser: 1.05 mg/dL — ABNORMAL HIGH (ref 0.44–1.00)
GFR, Estimated: 58 mL/min — ABNORMAL LOW (ref >60)
Glucose, Bld: 145 mg/dL — ABNORMAL HIGH (ref 70–99)
Potassium: 3.3 mmol/L — ABNORMAL LOW (ref 3.5–5.1)
Sodium: 131 mmol/L — ABNORMAL LOW (ref 135–145)

## 2023-02-11 LAB — CULTURE, RESPIRATORY W GRAM STAIN: Culture: NORMAL

## 2023-02-11 LAB — CULTURE, BLOOD (ROUTINE X 2): Special Requests: ADEQUATE

## 2023-02-11 MED ORDER — DOCUSATE SODIUM 50 MG/5ML PO LIQD
100.0000 mg | Freq: Two times a day (BID) | ORAL | Status: DC
  Administered 2023-02-11 – 2023-02-12 (×2): 100 mg
  Filled 2023-02-11 (×2): qty 10

## 2023-02-11 MED ORDER — PROPOFOL 1000 MG/100ML IV EMUL
0.0000 ug/kg/min | INTRAVENOUS | Status: DC
  Administered 2023-02-11: 5 ug/kg/min via INTRAVENOUS
  Administered 2023-02-12: 15 ug/kg/min via INTRAVENOUS
  Filled 2023-02-11 (×2): qty 100

## 2023-02-11 MED ORDER — PIPERACILLIN-TAZOBACTAM 3.375 G IVPB
3.3750 g | Freq: Three times a day (TID) | INTRAVENOUS | Status: DC
  Administered 2023-02-11 – 2023-02-12 (×3): 3.375 g via INTRAVENOUS
  Filled 2023-02-11 (×3): qty 50

## 2023-02-11 MED ORDER — MUPIROCIN 2 % EX OINT
1.0000 | TOPICAL_OINTMENT | Freq: Two times a day (BID) | CUTANEOUS | Status: DC
  Administered 2023-02-11 – 2023-02-12 (×2): 1 via NASAL
  Filled 2023-02-11: qty 22

## 2023-02-11 MED ORDER — VITAL AF 1.2 CAL PO LIQD
1000.0000 mL | ORAL | Status: DC
  Administered 2023-02-11: 1000 mL
  Filled 2023-02-11: qty 1000

## 2023-02-11 MED ORDER — FENTANYL CITRATE PF 50 MCG/ML IJ SOSY
25.0000 ug | PREFILLED_SYRINGE | INTRAMUSCULAR | Status: DC | PRN
  Administered 2023-02-12: 50 ug via INTRAVENOUS
  Administered 2023-02-12: 100 ug via INTRAVENOUS
  Administered 2023-02-12: 50 ug via INTRAVENOUS
  Administered 2023-02-12 (×3): 100 ug via INTRAVENOUS
  Filled 2023-02-11: qty 1
  Filled 2023-02-11: qty 2
  Filled 2023-02-11 (×2): qty 1
  Filled 2023-02-11 (×3): qty 2

## 2023-02-11 MED ORDER — HEPARIN SODIUM (PORCINE) 5000 UNIT/ML IJ SOLN
5000.0000 [IU] | Freq: Three times a day (TID) | INTRAMUSCULAR | Status: DC
  Administered 2023-02-11 – 2023-02-12 (×3): 5000 [IU] via SUBCUTANEOUS
  Filled 2023-02-11 (×2): qty 1

## 2023-02-11 MED ORDER — POLYETHYLENE GLYCOL 3350 17 G PO PACK
17.0000 g | PACK | Freq: Every day | ORAL | Status: DC
  Administered 2023-02-11 – 2023-02-12 (×2): 17 g
  Filled 2023-02-11 (×2): qty 1

## 2023-02-11 MED ORDER — POTASSIUM CHLORIDE 20 MEQ PO PACK
20.0000 meq | PACK | ORAL | Status: AC
  Administered 2023-02-11 (×2): 20 meq
  Filled 2023-02-11 (×2): qty 1

## 2023-02-11 MED ORDER — PROSOURCE TF20 ENFIT COMPATIBL EN LIQD
60.0000 mL | Freq: Every day | ENTERAL | Status: DC
  Administered 2023-02-11 – 2023-02-12 (×2): 60 mL
  Filled 2023-02-11 (×2): qty 60

## 2023-02-11 MED ORDER — FENTANYL CITRATE PF 50 MCG/ML IJ SOSY: 25.0000 ug | PREFILLED_SYRINGE | INTRAMUSCULAR | Status: DC | PRN

## 2023-02-11 MED ORDER — VITAL HIGH PROTEIN PO LIQD
1000.0000 mL | ORAL | Status: DC
  Filled 2023-02-11: qty 1000

## 2023-02-11 MED ORDER — PANTOPRAZOLE SODIUM 40 MG IV SOLR
40.0000 mg | INTRAVENOUS | Status: DC
  Administered 2023-02-11: 40 mg via INTRAVENOUS
  Filled 2023-02-11: qty 10

## 2023-02-11 MED ORDER — POTASSIUM CHLORIDE 10 MEQ/50ML IV SOLN
10.0000 meq | INTRAVENOUS | Status: AC
  Administered 2023-02-11 (×4): 10 meq via INTRAVENOUS
  Filled 2023-02-11 (×4): qty 50
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
## 2023-02-11 NOTE — Progress Notes (Signed)
Again vent settings needed to be adjusted to synchronize with pt's respirations (deep, long insp and exp) due to asynchrony on Rate of 16 causing decrease in effective ventilation. Pt sedation increased. Will continue to monitor and adjust accordingly to return rate to 24.

## 2023-02-11 NOTE — IPAL (Signed)
  Interdisciplinary Goals of Care Family Meeting   Date carried out:: 02/11/2023  Location of the meeting: Bedside  Member's involved: Physician, Bedside Registered Nurse, and Family Member or next of kin  Durable Power of Attorney or acting medical decision maker: daughter and son    Discussion: We discussed goals of care for [employer] .  I met with Ms. Jean's daughter, son, and daughter-in-law at bedside to discuss her ongoing care. They understand the concern for anoxic brain injury. They reaffirmed she would not want prolonged life support, based on discussion in the past. They agree with minimizing sedation, and if there has been no significant improvement by tomorrow obtaining an MRI. I provided them a letter to go with paperwork for them to try to get access to her accounts to allow them to pay her bills, etc.   Code status: Full DNR  Disposition: Continue current acute care   Time spent for the meeting: 15 min.   Steffanie Dunn 02/11/2023, 2:29 PM

## 2023-02-11 NOTE — Progress Notes (Signed)
RT called to assess pt. Art-line. Pt. Art line not functioning properly. Art line pulled. RN at the bedside aware. RN stated he would notify if line needs to be replaced.

## 2023-02-11 NOTE — Progress Notes (Signed)
Pharmacy Antibiotic Note  Yesenia Wilson is a 67 y.o. female admitted on 02/08/2023 with PEA arrest.  Pharmacy has been consulted for Zosyn dosing for possible aspiration.  Plan: Zosyn 3.375gm IV q8h (4hr extended infusions) Monitor renal function and adjust as needed Follow up sputum culture and narrow as able  Height: 5\' 3"  (160 cm) Weight: 80.7 kg (177 lb 14.6 oz) IBW/kg (Calculated) : 52.4  Temp (24hrs), Avg:98.9 F (37.2 C), Min:96.6 F (35.9 C), Max:101.3 F (38.5 C)  Recent Labs  Lab 01/20/2023 1015 02/07/2023 1040 02/14/2023 1542 02/15/2023 1907 02/08/2023 2150 02/10/23 0300 02/10/23 0436 02/10/23 1817 02/11/23 0402  WBC 25.5*  --   --   --   --   --   --   --  18.8*  CREATININE 1.28*   < > 1.27*  --  1.40* 1.19* 1.21* 1.05* 1.05*  LATICACIDVEN 8.6*  --  2.1* 2.5*  --   --   --   --   --    < > = values in this interval not displayed.    Estimated Creatinine Clearance: 51.6 mL/min (A) (by C-G formula based on SCr of 1.05 mg/dL (H)).    Allergies  Allergen Reactions   Other Other (See Comments)    Packing used during nose surgery : Shock    Antimicrobials this admission: 5/25 CTX >> 5/26 5/26 Zosyn >>  Dose adjustments this admission:  Microbiology results: 5/24 BCx: Staph epi (possible contaminant) 5/25 Trach asp: reincubating 5/26 MRSA PCR: sent  Thank you for allowing pharmacy to be a part of this patient's care.  Loralee Pacas, PharmD, BCPS 02/11/2023 3:05 PM  Please check AMION for all [facility] phone numbers After 10:00 PM, call Main Pharmacy [phone]

## 2023-02-11 NOTE — Progress Notes (Signed)
Vent settings adjusted to synchronize with pt's respirations (deep, long insp and exp) due to asynchrony on Rate of 24 causing decrease in effective ventilation. Pt sedation increased. Will continue to monitor and adjust accordingly to return rate to 24.

## 2023-02-11 NOTE — Progress Notes (Signed)
[facility] ADULT ICU REPLACEMENT PROTOCOL   The patient does apply for the [facility] Adult ICU Electrolyte Replacment Protocol based on the criteria listed below:   1.Exclusion criteria: TCTS, ECMO, Dialysis, and Myasthenia Gravis patients 2. Is GFR >/= 30 ml/min? Yes.    Patient's GFR today is 58 3. Is SCr </= 2? Yes.   Patient's SCr is 1.05 mg/dL 4. Did SCr increase >/= 0.5 in 24 hours? No. 5.Pt's weight >40kg  Yes.   6. Abnormal electrolyte(s): K  7. Electrolytes replaced per protocol 8.  Call MD STAT for K+ </= 2.5, Phos </= 1, or Mag </= 1 Physician:  [facility] G[facility] 02/11/2023 5:09 AM

## 2023-02-11 NOTE — Progress Notes (Signed)
Again vent settings needed to be adjusted to synchronize with pt's respirations (deep, long insp and exp) due to asynchrony on Rate of  causing decrease in effective ventilation. Pt sedation increased. Will continue to monitor and adjust accordingly to return rate to 24.

## 2023-02-11 NOTE — Progress Notes (Addendum)
Progress Note  Patient Name: Yesenia Wilson Date of Encounter: 02/11/2023  Primary Cardiologist: Rayetta Pigg Assar [facility] [place])  Subjective   Overnight able to wean pressors. Has had some dysnchrony. Discussed with nursing; no breathing over vent and been observed.  Inpatient Medications    Scheduled Meds:  aspirin  81 mg Per Tube Daily   atorvastatin  80 mg Per Tube Daily   Chlorhexidine Gluconate Cloth  6 each Topical Daily   docusate  100 mg Per Tube BID   polyethylene glycol  17 g Per Tube Daily   potassium chloride  20 mEq Per Tube Q4H   Continuous Infusions:  amiodarone 30 mg/hr (02/11/23 0600)   cefTRIAXone (ROCEPHIN)  IV Stopped (02/10/23 1159)   dexmedetomidine (PRECEDEX) IV infusion 0.6 mcg/kg/hr (02/11/23 0600)   fentaNYL infusion INTRAVENOUS 200 mcg/hr (02/11/23 0600)   insulin 3.8 Units/hr (02/11/23 0600)   midazolam 2 mg/hr (02/11/23 0600)   norepinephrine (LEVOPHED) Adult infusion 13 mcg/min (02/11/23 0600)   potassium chloride 10 mEq (02/11/23 0620)   PRN Meds: dextrose, fentaNYL, midazolam, midazolam   Vital Signs    Vitals:   02/11/23 0515 02/11/23 0530 02/11/23 0545 02/11/23 0600  BP:      Pulse: 65 64 64 64  Resp: 18 18 18 18   Temp: (!) 96.6 F (35.9 C) (!) 96.6 F (35.9 C) (!) 96.8 F (36 C) (!) 97 F (36.1 C)  TempSrc:      SpO2: 98% 98% 98% 98%  Weight:      Height:        Intake/Output Summary (Last 24 hours) at 02/11/2023 0748 Last data filed at 02/11/2023 0600 Gross per 24 hour  Intake 2476.13 ml  Output 1045 ml  Net 1431.13 ml   Filed Weights   01/23/2023 1311 02/10/23 0500 02/11/23 0330  Weight: 80 kg 81.3 kg 80.7 kg    Telemetry    SR rare PAC - Personally Reviewed  Physical Exam   Gen: Intubated and sedated Cardiac: No Rubs or Gallops, systolic murmur, RRR  Respiratory: Mechanical breath sounds GI: Soft, nontender, non-distended  MS: No edema; extremities cool to touch  Labs    Chemistry Recent Labs  Lab  02/16/2023 1015 01/29/2023 1040 02/10/23 0436 02/10/23 1817 02/10/23 1943 02/11/23 0402 02/11/23 0410  NA 136   < > 134* 131* 136 131* 134*  K 3.2*   < > 3.4* 4.0 3.9 3.3* 3.5  CL 100   < > 103 101  --  101  --   CO2 16*   < > 21* 20*  --  23  --   GLUCOSE 517*   < > 278* 140*  --  145*  --   BUN 19   < > 33* 30*  --  29*  --   CREATININE 1.28*   < > 1.21* 1.05*  --  1.05*  --   CALCIUM 8.7*   < > 8.2* 8.1*  --  7.6*  --   PROT 6.4*  --  6.3*  --   --   --   --   ALBUMIN 3.3*  --  2.9*  --   --   --   --   AST 215*  --  87*  --   --   --   --   ALT 161*  --  115*  --   --   --   --   ALKPHOS 102  --  80  --   --   --   --  BILITOT 0.9  --  0.7  --   --   --   --   GFRNONAA 46*   < > 49* 58*  --  58*  --   ANIONGAP 20*   < > 10 10  --  7  --    < > = values in this interval not displayed.     Hematology Recent Labs  Lab 01/29/2023 1015 01/23/2023 1040 02/10/23 1943 02/11/23 0402 02/11/23 0410  WBC 25.5*  --   --  18.8*  --   RBC 4.61  --   --  4.28  --   HGB 14.3   < > 13.9 12.9 12.9  HCT 43.5   < > 41.0 38.6 38.0  MCV 94.4  --   --  90.2  --   MCH 31.0  --   --  30.1  --   MCHC 32.9  --   --  33.4  --   RDW 13.1  --   --  13.4  --   PLT 328  --   --  236  --    < > = values in this interval not displayed.    Cardiac EnzymesNo results for input(s): "TROPONINI" in the last 168 hours. No results for input(s): "TROPIPOC" in the last 168 hours.   BNPNo results for input(s): "BNP", "PROBNP" in the last 168 hours.   DDimer No results for input(s): "DDIMER" in the last 168 hours.   Radiology    DG Chest Port 1 View  Result Date: 02/10/2023 CLINICAL DATA:  Cardiac arrest EXAM: PORTABLE CHEST 1 VIEW COMPARISON:  Yesterday FINDINGS: Endotracheal tube with tip just below the clavicular heads. Enteric tube at least reaches the stomach. Left IJ line with tip at the SVC. Normal heart size. Improved aeration especially about the right hilum; atelectasis seen on recent chest CT. No  visible effusion or pneumothorax. IMPRESSION: 1. Unremarkable hardware. 2. Mild improvement in atelectasis. Electronically Signed   By: Tiburcio Pea M.D.   On: 02/10/2023 06:34   DG CHEST PORT 1 VIEW  Result Date: 01/26/2023 CLINICAL DATA:  Cardiac arrest EXAM: PORTABLE CHEST 1 VIEW COMPARISON:  02/14/2023 FINDINGS: The patient is rotated to the right on today's radiograph, reducing diagnostic sensitivity and specificity. Endotracheal tube tip 2.8 cm above the carina. Left IJ line tip: SVC. Suspected right perihilar and infrahilar airspace opacity, although obscured by the rightward rotation. The left lung appears clear. No blunting of the costophrenic angles. IMPRESSION: 1. Suspected right perihilar and infrahilar airspace opacity, although obscured by the patient's rightward rotation. 2. Endotracheal tube tip 2.8 cm above the carina. Left IJ line tip: SVC. Electronically Signed   By: Gaylyn Rong M.D.   On: 02/08/2023 16:59   EEG adult  Result Date: 01/31/2023 Rejeana Brock, MD     01/20/2023  3:20 PM History: 67 year old female status post cardiac arrest Sedation: Precedex, fentanyl Technique: This EEG was acquired with electrodes placed according to the International 10-20 electrode system (including Fp1, Fp2, F3, F4, C3, C4, P3, P4, O1, O2, T3, T4, T5, T6, A1, A2, Fz, Cz, Pz). The following electrodes were missing or displaced: none. Background: The background is relatively attenuated, with an absence of faster frequencies but there is some low voltage irregular generalized delta seen. Photic stimulation: Physiologic driving is now performed EEG Abnormalities: 1) generalized irregular slow activity 2) absent posterior dominant rhythm Clinical Interpretation: This EEG is consistent with a generalized nonspecific cerebral dysfunction (encephalopathy). There was  no seizure or seizure predisposition recorded on this study. Please note that lack of epileptiform activity on EEG does not  preclude the possibility of epilepsy. Ritta Slot, MD [facility] [phone] If 7pm- 7am, please page neurology on call as listed in AMION.   DG CHEST PORT 1 VIEW  Result Date: 01/27/2023 CLINICAL DATA:  Central line placement. EXAM: PORTABLE CHEST 1 VIEW COMPARISON:  Same day. FINDINGS: Interval placement of left internal jugular catheter with distal tip in expected position of SVC. No pneumothorax is noted. Endotracheal tube is in good position. Nasogastric tube is seen entering stomach. IMPRESSION: Interval placement of left internal jugular catheter with distal tip in expected position of the SVC. Electronically Signed   By: Lupita Raider M.D.   On: 02/14/2023 14:49   CT Angio Chest/Abd/Pel for Dissection W and/or Wo Contrast  Result Date: 01/21/2023 CLINICAL DATA:  Status post cardiac arrest.  Acute aortic syndrome. EXAM: CT ANGIOGRAPHY CHEST, ABDOMEN AND PELVIS TECHNIQUE: Non-contrast CT of the chest was initially obtained. Multidetector CT imaging through the chest, abdomen and pelvis was performed using the standard protocol during bolus administration of intravenous contrast. Multiplanar reconstructed images and MIPs were obtained and reviewed to evaluate the vascular anatomy. RADIATION DOSE REDUCTION: This exam was performed according to the departmental dose-optimization program which includes automated exposure control, adjustment of the mA and/or kV according to patient size and/or use of iterative reconstruction technique. CONTRAST:  OMNIPAQUE IOHEXOL 350 MG/ML SOLN COMPARISON:  August 05, 2022. FINDINGS: CTA CHEST FINDINGS Cardiovascular: Preferential opacification of the thoracic aorta. No evidence of thoracic aortic aneurysm or dissection. Normal heart size. No pericardial effusion. Mediastinum/Nodes: Endotracheal tube is in grossly good position. Thyroid gland is not well visualized. No adenopathy is noted. Mildly dilated upper esophagus is noted containing  food debris. Lungs/Pleura: No pneumothorax or pleural effusion is noted. Mild bilateral posterior basilar subsegmental atelectasis is noted. Musculoskeletal: Mildly displaced fracture is seen involving the anterior portion of the left third rib Review of the MIP images confirms the above findings. CTA ABDOMEN AND PELVIS FINDINGS VASCULAR Aorta: Atherosclerosis of abdominal aorta is noted without aneurysm or dissection. Celiac: Moderate stenosis is noted proximally with poststenotic dilatation. SMA: Patent without evidence of aneurysm, dissection, vasculitis or significant stenosis. Renals: Both renal arteries are patent without evidence of aneurysm, dissection, vasculitis, fibromuscular dysplasia or significant stenosis. IMA: Patent without evidence of aneurysm, dissection, vasculitis or significant stenosis. Inflow: Patent without evidence of aneurysm, dissection, vasculitis or significant stenosis. Veins: No obvious venous abnormality within the limitations of this arterial phase study. Review of the MIP images confirms the above findings. NON-VASCULAR Hepatobiliary: No focal liver abnormality is seen. Status post cholecystectomy. No biliary dilatation. Pancreas: Unremarkable. No pancreatic ductal dilatation or surrounding inflammatory changes. Spleen: Normal in size without focal abnormality. Adrenals/Urinary Tract: Adrenal glands and kidneys appear normal. No hydronephrosis or renal obstruction is noted. Urinary bladder is decompressed secondary to Foley catheter. Stomach/Bowel: Severe gastric distention is noted. There is no evidence of large or small bowel obstruction or inflammation. Status post appendectomy. Lymphatic: No adenopathy is noted. Reproductive: Status post hysterectomy. No adnexal masses. Other: No abdominal wall hernia or abnormality. No abdominopelvic ascites. Musculoskeletal: No acute or significant osseous findings. Review of the MIP images confirms the above findings. IMPRESSION: No evidence  of thoracic or abdominal aortic dissection or aneurysm. Mildly displaced left third rib fracture. Mild bilateral posterior basilar subsegmental atelectasis. Mildly dilated proximal esophagus containing food debris. Severe gastric distention is noted. Gastric outlet  obstruction cannot be excluded. Moderate stenosis noted at origin of celiac artery. Aortic Atherosclerosis (ICD10-I70.0). Electronically Signed   By: Lupita Raider M.D.   On: 01/21/2023 12:57   DG Abd Portable 1 View  Result Date: 01/18/2023 CLINICAL DATA:  67 year old female status post CPR. Enteric tube placement. EXAM: PORTABLE ABDOMEN - 1 VIEW COMPARISON:  Head CT today. CTA chest abdomen and pelvis reported separately. Portable chest 1036 hours today. FINDINGS: Portable AP supine view at 1203 hours. Mildly rotated to the right. Endotracheal tube tip above the clavicles. Enteric tube courses into the left upper quadrant, tip and side hole not included. Stable ventilation. IMPRESSION: 1. Enteric tube placed into the stomach, tip not included. NOTE that on Head CT the tube was looped in the nasopharynx before continuing inferiorly. Reposition that segment if possible. 2. Stable ventilation, see CTA reported separately. Electronically Signed   By: Odessa Fleming M.D.   On: 02/11/2023 12:16   CT HEAD WO CONTRAST  Result Date: 02/15/2023 CLINICAL DATA:  67 year old female status post CPR. EXAM: CT HEAD WITHOUT CONTRAST TECHNIQUE: Contiguous axial images were obtained from the base of the skull through the vertex without intravenous contrast. RADIATION DOSE REDUCTION: This exam was performed according to the departmental dose-optimization program which includes automated exposure control, adjustment of the mA and/or kV according to patient size and/or use of iterative reconstruction technique. COMPARISON:  Head CT 03/31/2020. FINDINGS: Brain: Cerebral volume remains normal for age. No midline shift, ventriculomegaly, mass effect, evidence of mass lesion,  intracranial hemorrhage or evidence of cortically based acute infarction. Possible small area of chronic encephalomalacia in the inferior right occipital lobe, versus normal sulcus variation series 3, image 11. Elsewhere gray-white differentiation is within normal limits for age. Vascular: Calcified atherosclerosis at the skull base. No suspicious intracranial vascular hyperdensity. Skull: No acute osseous abnormality identified. Sinuses/Orbits: Intubated. Fluid in the visible pharynx and nasal cavity. Mild paranasal sinus mucosal thickening and fluid. Tympanic cavities and mastoids remain well aerated. Other: Enteric tube loops in the pharynx. No acute orbit or scalp soft tissue finding. IMPRESSION: 1. No acute intracranial abnormality. Negative aside from questionable small focus of chronic encephalomalacia in the right occipital lobe. 2. Enteric tube loops in the pharynx recommend repositioning. Intubated. Electronically Signed   By: Odessa Fleming M.D.   On: 02/05/2023 12:14   ECHOCARDIOGRAM COMPLETE  Result Date: 02/06/2023    ECHOCARDIOGRAM REPORT   Patient Name:   PANYA AFFELDT Date of Exam: 01/25/2023 Medical Rec #:  [id number]     Height:       63.0 in Accession #:    [id number]    Weight:       178.5 lb Date of Birth:  1956-06-05    BSA:          1.842 m Patient Age:    66 years      BP:           139/90 mmHg Patient Gender: F             HR:           160 bpm. Exam Location:  Inpatient Procedure: 2D Echo, Color Doppler, Cardiac Doppler and Intracardiac            Opacification Agent STAT ECHO Indications:    CMO unspecified i24.9  History:        Patient has prior history of Echocardiogram examinations, most  recent 09/21/2021. Risk Factors:Hypertension, Diabetes and                 Dyslipidemia.  Sonographer:    Irving Burton Senior RDCS Referring Phys: [phone] [facility]  Sonographer Comments: Scanned supine on artificial respirator. IMPRESSIONS  1. Left ventricular ejection fraction, by estimation,  is 20 to 25%. The left ventricle has severely decreased function. The left ventricle demonstrates regional wall motion abnormalities (see scoring diagram/findings for description). There is mild left  ventricular hypertrophy. Left ventricular diastolic parameters are indeterminate.  2. Right ventricular systolic function is moderately reduced. The right ventricular size is normal. There is moderately elevated pulmonary artery systolic pressure.  3. The mitral valve is normal in structure. Mild to moderate mitral valve regurgitation.  4. The aortic valve is grossly normal. Aortic valve regurgitation is not visualized.  5. There is borderline dilatation of the ascending aorta, measuring 38 mm.  6. The inferior vena cava is normal in size with <50% respiratory variability, suggesting right atrial pressure of 8 mmHg. FINDINGS  Left Ventricle: Left ventricular ejection fraction, by estimation, is 20 to 25%. The left ventricle has severely decreased function. The left ventricle demonstrates regional wall motion abnormalities. Definity contrast agent was given IV to delineate the left ventricular endocardial borders. The left ventricular internal cavity size was normal in size. There is mild left ventricular hypertrophy. Left ventricular diastolic parameters are indeterminate.  LV Wall Scoring: The mid and distal anterior septum, mid inferolateral segment, mid inferoseptal segment, apical inferior segment, and apex are akinetic. The entire anterior wall, antero-lateral wall, inferior wall, basal anteroseptal segment, apical lateral segment, and basal inferoseptal segment are hypokinetic. Right Ventricle: The right ventricular size is normal. No increase in right ventricular wall thickness. Right ventricular systolic function is moderately reduced. There is moderately elevated pulmonary artery systolic pressure. The tricuspid regurgitant velocity is 3.18 m/s, and with an assumed right atrial pressure of 8 mmHg, the  estimated right ventricular systolic pressure is 48.4 mmHg. Left Atrium: Left atrial size was normal in size. Right Atrium: Right atrial size was normal in size. Pericardium: Trivial pericardial effusion is present. Mitral Valve: The mitral valve is normal in structure. Mild to moderate mitral valve regurgitation. Tricuspid Valve: The tricuspid valve is normal in structure. Tricuspid valve regurgitation is mild. Aortic Valve: The aortic valve is grossly normal. Aortic valve regurgitation is not visualized. Pulmonic Valve: The pulmonic valve was normal in structure. Pulmonic valve regurgitation is trivial. Aorta: The aortic root is normal in size and structure. There is borderline dilatation of the ascending aorta, measuring 38 mm. Venous: The inferior vena cava is normal in size with less than 50% respiratory variability, suggesting right atrial pressure of 8 mmHg. IAS/Shunts: No atrial level shunt detected by color flow Doppler.  LEFT VENTRICLE PLAX 2D LVOT diam:     2.00 cm LV SV:         44 LV SV Index:   24 LVOT Area:     3.14 cm  LV Volumes (MOD) LV vol d, MOD A2C: 62.4 ml LV vol d, MOD A4C: 72.9 ml LV vol s, MOD A2C: 51.6 ml LV vol s, MOD A4C: 54.6 ml LV SV MOD A2C:     10.8 ml LV SV MOD A4C:     72.9 ml LV SV MOD BP:      12.2 ml RIGHT VENTRICLE RV S prime:     10.00 cm/s TAPSE (M-mode): 1.0 cm LEFT ATRIUM  Index        RIGHT ATRIUM          Index LA Vol (A2C):   36.9 ml 20.03 ml/m  RA Area:     8.23 cm LA Vol (A4C):   24.8 ml 13.46 ml/m  RA Volume:   15.30 ml 8.30 ml/m LA Biplane Vol: 30.4 ml 16.50 ml/m  AORTIC VALVE LVOT Vmax:   101.00 cm/s LVOT Vmean:  74.100 cm/s LVOT VTI:    0.139 m  AORTA Ao Root diam: 3.20 cm Ao Asc diam:  3.80 cm TRICUSPID VALVE TR Peak grad:   40.4 mmHg TR Vmax:        318.00 cm/s  SHUNTS Systemic VTI:  0.14 m Systemic Diam: 2.00 cm Arvilla Meres MD Electronically signed by Arvilla Meres MD Signature Date/Time: 01/27/2023/11:36:46 AM    Final    DG Chest Port  1 View  Result Date: 01/21/2023 CLINICAL DATA:  Intubation. EXAM: PORTABLE CHEST 1 VIEW COMPARISON:  Chest x-ray dated August 21, 2022. FINDINGS: The patient is rotated to the right. Endotracheal tube tip in good position 3.2 cm above the carina. Enteric tube tip seen in the lower neck, possibly within the right piriform sinus. The heart size and mediastinal contours are within normal limits. Normal pulmonary vascularity. No focal consolidation, pleural effusion, or pneumothorax. No acute osseous abnormality. IMPRESSION: 1. Endotracheal tube tip in good position. 2. Enteric tube tip seen in the lower neck, possibly within the right piriform sinus. Recommend repositioning. 3. No active disease. Electronically Signed   By: Obie Dredge M.D.   On: 02/04/2023 11:09   DG Abd Portable 1V  Result Date: 02/05/2023 CLINICAL DATA:  Enteric tube placement. EXAM: PORTABLE ABDOMEN - 1 VIEW COMPARISON:  CT abdomen pelvis dated August 05, 2022. FINDINGS: No enteric tube identified in the field of view. Markedly distended stomach. IMPRESSION: 1. No enteric tube identified. Markedly distended stomach. Electronically Signed   By: Obie Dredge M.D.   On:  11:07     Patient Profile     67 y.o. female post arrest   Assessment & Plan     Cardiac Arrest Asystole - PEA arrest X1 witnessed with concern for LAD infarct -- Intubated, sedated at present -- follow for neurologic recovery, will need ischemic evaluation pending status and GOC; previously discussed with daugther and family   Acute hypoxic respiratory failure 2/2 cardiac arrest Hx of COPD -- intubated, management per PCCM   Acute HFrEF -- Echocardiogram with LVEF of 20-25%, with multiple wall motion abnormalities -- will need eventual GDMT, pressor requirement has decreased - may add lasix tomorrow depending on her vent status and overall status    WCT Concerning for AFL with wide conduction of her RBBB vs atrial  tachycardia Quiescent on IV amiodarone - heparin as tolerated - if recovery would switch to PO amiodarone   Hypokalemia Hypocalcemia -- supp   Elevated LFTs -- in the setting of cardiac arrest   DM -- difficult to control   Leukocytosis On Abx for 1/3 cultures + -- per primary  If makes neurological recovery, would plan for LHC if within families//patient wishes; this is becoming less likely to be the case  CRITICAL CARE Performed by: Muhammed Teutsch A Buddie Marston  Total critical care time: 30 minutes. Critical care time was exclusive of separately billable procedures and treating other patients. Critical care was necessary to treat or prevent imminent or life-threatening deterioration. Critical care was time spent personally by me on the following activities:  development of treatment plan with patient and/or surrogate as well as nursing, discussions with consultants, evaluation of patient's response to treatment, examination of patient, obtaining history from patient or surrogate, ordering and performing treatments and interventions, ordering and review of laboratory studies, ordering and review of radiographic studies, pulse oximetry and re-evaluation of patient's condition.    Signed, Riley Lam, MD FASE [facility] [place]  [facility]  02/11/2023 9:15 AM    For questions or updates, please contact [facility] Please consult [email or web address] for contact info under Cardiology/STEMI.      Riley Lam, MD FASE [facility] Cardiologist [facility]  [address] [place], [address] [place], [place] [address] [phone]  7:48 AM

## 2023-02-12 ENCOUNTER — Inpatient Hospital Stay (HOSPITAL_COMMUNITY): Payer: 59

## 2023-02-12 DIAGNOSIS — Z9911 Dependence on respirator [ventilator] status: Secondary | ICD-10-CM | POA: Diagnosis not present

## 2023-02-12 DIAGNOSIS — I469 Cardiac arrest, cause unspecified: Secondary | ICD-10-CM | POA: Diagnosis not present

## 2023-02-12 DIAGNOSIS — J9601 Acute respiratory failure with hypoxia: Secondary | ICD-10-CM | POA: Diagnosis not present

## 2023-02-12 LAB — GLUCOSE, CAPILLARY
Glucose-Capillary: 230 mg/dL — ABNORMAL HIGH (ref 70–99)
Glucose-Capillary: 219 mg/dL — ABNORMAL HIGH (ref 70–99)
Glucose-Capillary: 197 mg/dL — ABNORMAL HIGH (ref 70–99)
Glucose-Capillary: 201 mg/dL — ABNORMAL HIGH (ref 70–99)
Glucose-Capillary: 213 mg/dL — ABNORMAL HIGH (ref 70–99)
Glucose-Capillary: 203 mg/dL — ABNORMAL HIGH (ref 70–99)
Glucose-Capillary: 193 mg/dL — ABNORMAL HIGH (ref 70–99)
Glucose-Capillary: 183 mg/dL — ABNORMAL HIGH (ref 70–99)
Glucose-Capillary: 231 mg/dL — ABNORMAL HIGH (ref 70–99)
Glucose-Capillary: 220 mg/dL — ABNORMAL HIGH (ref 70–99)
Glucose-Capillary: 212 mg/dL — ABNORMAL HIGH (ref 70–99)
Glucose-Capillary: 154 mg/dL — ABNORMAL HIGH (ref 70–99)

## 2023-02-12 LAB — CBC
HCT: 35.9 % — ABNORMAL LOW (ref 36.0–46.0)
Hemoglobin: 12 g/dL (ref 12.0–15.0)
MCH: 30.2 pg (ref 26.0–34.0)
MCHC: 33.4 g/dL (ref 30.0–36.0)
MCV: 90.4 fL (ref 80.0–100.0)
Platelets: 175 10*3/uL (ref 150–400)
RBC: 3.97 MIL/uL (ref 3.87–5.11)
RDW: 13.4 % (ref 11.5–15.5)
WBC: 12.3 10*3/uL — ABNORMAL HIGH (ref 4.0–10.5)
nRBC: 0 % (ref 0.0–0.2)

## 2023-02-12 LAB — BASIC METABOLIC PANEL
Anion gap: 9 (ref 5–15)
BUN: 22 mg/dL (ref 8–23)
CO2: 21 mmol/L — ABNORMAL LOW (ref 22–32)
Calcium: 7.8 mg/dL — ABNORMAL LOW (ref 8.9–10.3)
Chloride: 101 mmol/L (ref 98–111)
Creatinine, Ser: 0.95 mg/dL (ref 0.44–1.00)
GFR, Estimated: >60 mL/min (ref >60)
Glucose, Bld: 205 mg/dL — ABNORMAL HIGH (ref 70–99)
Potassium: 3.1 mmol/L — ABNORMAL LOW (ref 3.5–5.1)
Sodium: 131 mmol/L — ABNORMAL LOW (ref 135–145)

## 2023-02-12 LAB — PHOSPHORUS: Phosphorus: 1.9 mg/dL — ABNORMAL LOW (ref 2.5–4.6)

## 2023-02-12 LAB — CULTURE, BLOOD (ROUTINE X 2): Special Requests: ADEQUATE

## 2023-02-12 LAB — TRIGLYCERIDES: Triglycerides: 161 mg/dL — ABNORMAL HIGH (ref <150)

## 2023-02-12 LAB — MAGNESIUM: Magnesium: 2 mg/dL (ref 1.7–2.4)

## 2023-02-12 MED ORDER — GLYCOPYRROLATE 0.2 MG/ML IJ SOLN
0.2000 mg | INTRAMUSCULAR | Status: DC | PRN
  Filled 2023-02-12: qty 1

## 2023-02-12 MED ORDER — DIPHENHYDRAMINE HCL 50 MG/ML IJ SOLN
25.0000 mg | INTRAMUSCULAR | Status: DC | PRN
  Administered 2023-02-12: 25 mg via INTRAVENOUS
  Filled 2023-02-12: qty 1

## 2023-02-12 MED ORDER — GLYCOPYRROLATE 1 MG PO TABS: 1.0000 mg | ORAL_TABLET | ORAL | Status: DC | PRN

## 2023-02-12 MED ORDER — MIDAZOLAM-SODIUM CHLORIDE 100-0.9 MG/100ML-% IV SOLN
2.0000 mg/h | INTRAVENOUS | Status: DC
  Administered 2023-02-12: 2 mg/h via INTRAVENOUS
  Filled 2023-02-12: qty 100

## 2023-02-12 MED ORDER — SODIUM CHLORIDE 0.9 % IV SOLN: INTRAVENOUS | Status: DC

## 2023-02-12 MED ORDER — ACETAMINOPHEN 325 MG PO TABS: 650.0000 mg | ORAL_TABLET | Freq: Four times a day (QID) | ORAL | Status: DC | PRN

## 2023-02-12 MED ORDER — ONDANSETRON 4 MG PO TBDP: 4.0000 mg | ORAL_TABLET | Freq: Four times a day (QID) | ORAL | Status: DC | PRN

## 2023-02-12 MED ORDER — ORAL CARE MOUTH RINSE: 15.0000 mL | OROMUCOSAL | Status: DC | PRN

## 2023-02-12 MED ORDER — POTASSIUM CHLORIDE 20 MEQ PO PACK
40.0000 meq | PACK | ORAL | Status: AC
  Administered 2023-02-12 (×2): 40 meq
  Filled 2023-02-12 (×2): qty 2

## 2023-02-12 MED ORDER — AMIODARONE HCL 200 MG PO TABS
200.0000 mg | ORAL_TABLET | Freq: Two times a day (BID) | ORAL | Status: DC
  Administered 2023-02-12: 200 mg
  Filled 2023-02-12: qty 1

## 2023-02-12 MED ORDER — MIDAZOLAM HCL 2 MG/2ML IJ SOLN
4.0000 mg | Freq: Once | INTRAMUSCULAR | Status: AC
  Administered 2023-02-12: 4 mg via INTRAVENOUS

## 2023-02-12 MED ORDER — MORPHINE 100MG IN NS 100ML (1MG/ML) PREMIX INFUSION
0.0000 mg/h | INTRAVENOUS | Status: DC
  Administered 2023-02-12: 20 mg/h via INTRAVENOUS
  Administered 2023-02-12: 5 mg/h via INTRAVENOUS
  Filled 2023-02-12: qty 100

## 2023-02-12 MED ORDER — ONDANSETRON HCL 4 MG/2ML IJ SOLN: 4.0000 mg | Freq: Four times a day (QID) | INTRAMUSCULAR | Status: DC | PRN

## 2023-02-12 MED ORDER — POLYVINYL ALCOHOL 1.4 % OP SOLN: 1.0000 [drp] | Freq: Four times a day (QID) | OPHTHALMIC | Status: DC | PRN

## 2023-02-12 MED ORDER — MORPHINE BOLUS VIA INFUSION
5.0000 mg | INTRAVENOUS | Status: DC | PRN
  Administered 2023-02-12 (×4): 5 mg via INTRAVENOUS

## 2023-02-12 MED ORDER — POTASSIUM PHOSPHATES 15 MMOLE/5ML IV SOLN
30.0000 mmol | Freq: Once | INTRAVENOUS | Status: AC
  Administered 2023-02-12: 30 mmol via INTRAVENOUS
  Filled 2023-02-12: qty 10

## 2023-02-12 MED ORDER — GLYCOPYRROLATE 0.2 MG/ML IJ SOLN
0.2000 mg | INTRAMUSCULAR | Status: DC | PRN
  Administered 2023-02-12: 0.2 mg via INTRAVENOUS

## 2023-02-12 MED ORDER — MIDAZOLAM HCL 2 MG/2ML IJ SOLN
2.0000 mg | INTRAMUSCULAR | Status: DC | PRN
  Administered 2023-02-12: 4 mg via INTRAVENOUS
  Filled 2023-02-12 (×2): qty 4

## 2023-02-12 MED ORDER — ORAL CARE MOUTH RINSE
15.0000 mL | OROMUCOSAL | Status: DC
  Administered 2023-02-12 (×9): 15 mL via OROMUCOSAL

## 2023-02-12 MED ORDER — POTASSIUM & SODIUM PHOSPHATES 280-160-250 MG PO PACK
2.0000 | PACK | Freq: Once | ORAL | Status: AC
  Administered 2023-02-12: 2
  Filled 2023-02-12: qty 2

## 2023-02-12 MED ORDER — HALOPERIDOL LACTATE 5 MG/ML IJ SOLN
2.5000 mg | INTRAMUSCULAR | Status: DC | PRN
  Administered 2023-02-12: 5 mg via INTRAVENOUS
  Filled 2023-02-12: qty 1

## 2023-02-12 MED ORDER — ACETAMINOPHEN 650 MG RE SUPP: 650.0000 mg | Freq: Four times a day (QID) | RECTAL | Status: DC | PRN

## 2023-02-12 MED ORDER — FUROSEMIDE 10 MG/ML IJ SOLN
20.0000 mg | Freq: Once | INTRAMUSCULAR | Status: AC
  Administered 2023-02-12: 20 mg via INTRAVENOUS
  Filled 2023-02-12: qty 2

## 2023-02-12 NOTE — Progress Notes (Signed)
Initial Nutrition Assessment  DOCUMENTATION CODES:   Not applicable  INTERVENTION:   Tube Feeding via OG:  Vital AF 1.2 at 50 ml/hr Pro-Source TF20 mL daily This provides 1520 kcals, 110 g of protein and 972 mL of free water  Minimal calories via propofol at this time  Continue electrolyte monitoring with supplementation as needed   NUTRITION DIAGNOSIS:   Inadequate oral intake related to acute illness as evidenced by NPO status.  GOAL:   Patient will meet greater than or equal to 90% of their needs  MONITOR:   Labs, Vent status, Weight trends, TF tolerance  REASON FOR ASSESSMENT:   Consult, Ventilator Enteral/tube feeding initiation and management  ASSESSMENT:   67 yo female admitted with asystolic cardiac arrest after tonic-clonic seizure, recurrent arrest in ICU, concern for anoxic injury. Pt current intubated; PMH includes COPD, DM, pacnreatitis, GERD, HTN, HLD, CAD, anxiety, PTSD  5/24 Admitted s/p cardiac arrest, intubated, recurrent PEA arrest in ICU  Pt remains on vent support, MRI with global anoxic injury diffusely affecting gray matter Propofol: 2 ml/hr Off Levophed  TF initiated yesterday via Adult TF protocol, Vital AF 1.2 at 40  Unable to obtain diet and weight history at this time. Noted hypokalemia and hypophosphatemia today, both being addressed via supplementation  Labs: sodium 131 (L), potassium 3.1 (L), phosphorus 1.9 (L), CBGs 183-230 Meds: colace, insulin drip    Diet Order:   Diet Order             Diet NPO time specified  Diet effective now                   EDUCATION NEEDS:   Education needs have been addressed  Skin:  Skin Assessment: Reviewed RN Assessment  Last BM:  PTA  Height:   Ht Readings from Last 1 Encounters:  02/02/2023 5\' 3"  (1.6 m)    Weight:   Wt Readings from Last 1 Encounters:   83.2 kg     BMI:  Body mass index is 32.49 kg/m.  Estimated Nutritional Needs:   Kcal:  1500-1700  kcals  Protein:  90-110 g  Fluid:  >/= 1.7 L    Romelle Starcher MS, RDN, LDN, CNSC Registered Dietitian 3 Clinical Nutrition RD Pager and On-Call Pager Number Located in [place] ,

## 2023-02-12 NOTE — Progress Notes (Signed)
eLink Physician-Brief Progress Note Patient Name: Yesenia Wilson DOB: [birth year] MRN: [id number]   Date of Service    HPI/Events of Note  Notified of patient's passing at 2240.  Pt was comfort care.   eICU Interventions       Intervention Category Minor Interventions: Other:  Larinda Buttery , 11:43 PM

## 2023-02-12 NOTE — Progress Notes (Signed)
Pt transported from MRI to 2H20 on the ventilator without complication. RT, RN and NT accompanied pt.

## 2023-02-12 NOTE — Progress Notes (Signed)
eLink Physician-Brief Progress Note Patient Name: Yesenia Wilson DOB: 07-Oct-1955 MRN: [id number]   Date of Service    HPI/Events of Note  67 year old female in ICU postcardiac arrest with encephalopathy.  Foley catheter not draining despite bladder scan showing greater than 500 cc of urine.  Attempts to flush Foley unsuccessful.  eICU Interventions  Okay to change out Foley catheter.        Jemila Camille , 3:00 AM

## 2023-02-12 NOTE — Progress Notes (Signed)
Pt transported to MRI without event.

## 2023-02-12 NOTE — Progress Notes (Signed)
Pt with minimal urine output x 3 hours. Bladder with 570 cc per bladder scan. Attempt to flush cath with 10 cc ns unsuccessful due to resistance. MD notified foley d/ced and replaced. 525 cc retrieved from bladder. 2nd Rn at bedside.

## 2023-02-12 NOTE — IPAL (Signed)
  Interdisciplinary Goals of Care Family Meeting   Date carried out::   Location of the meeting: Bedside  Member's involved: Physician, Bedside Registered Nurse, and Family Member or next of kin  Durable Power of Attorney or acting medical decision maker: daughter & son    Discussion: We discussed goals of care for [employer] .  Multiple family members were  present including her 2 adult children. They have had time to visit and want to respect her wishes to not prolong her suffering in a case where she is unlikely to recover. They wish to proceed with a terminal extubation to focus on her comfort.   Code status: Full DNR  Disposition: In-patient comfort care   Time spent for the meeting: 10 min.  Steffanie Dunn , 5:47 PM
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
## 2023-02-12 NOTE — Progress Notes (Signed)
[facility] ADULT ICU REPLACEMENT PROTOCOL   The patient does apply for the [facility] Adult ICU Electrolyte Replacment Protocol based on the criteria listed below:   1.Exclusion criteria: TCTS, ECMO, Dialysis, and Myasthenia Gravis patients 2. Is GFR >/= 30 ml/min? Yes.    Patient's GFR today is > 60 3. Is SCr </= 2? Yes.   Patient's SCr is 0.95 mg/dL 4. Did SCr increase >/= 0.5 in 24 hours? No. 5.Pt's weight >40kg  Yes.   6. Abnormal electrolyte(s): K+ 3.1, Phos 1.9  7. Electrolytes replaced per protocol 8.  Call MD STAT for K+ </= 2.5, Phos </= 1, or Mag </= 1 Physician:  Dr Cory Roughen, Jettie Booze  7:17 AM

## 2023-02-12 NOTE — Progress Notes (Signed)
I called to update Yesenia Wilson and her brother Yesenia Wilson regarding the MRI results showing diffuse anoxic brain injury. [name] cousin and niece were at bedside and have also been udpated.   [name] [phone]) Daughter-in-law [phone])  Steffanie Dunn, DO  12:48 PM [facility]  For contact information, see Amion. If no response to pager, please call PCCM consult pager. After hours, 7PM- 7AM, please call Elink.

## 2023-02-12 NOTE — TOC CM/SW Note (Signed)
.   Transition of Care [facility]) Screening Note   Patient Details  Name: Diania Reason Date of Birth: January 11, 1956   Transition of [facility]) CM/SW Contact:    Elliot Cousin, RN Phone Number: [phone] , 4:17 PM    Transition of Care Department [facility]) has reviewed patient. We will continue to monitor patient advancement through interdisciplinary progression rounds. Will continue to follow for dc needs.

## 2023-02-12 NOTE — Progress Notes (Signed)
Pt extubated to comfort care measures per physician order/family request. This RT also removed the R NG tube without complication. RT will continue to be available as needed.

## 2023-02-12 NOTE — Progress Notes (Addendum)
Progress Note  Patient Name: Yesenia Wilson Date of Encounter:   Primary Cardiologist: Rayetta Pigg Ass[facility])  Subjective   No real purposeful movement. No change  Inpatient Medications    Scheduled Meds:  aspirin  81 mg Per Tube Daily   atorvastatin  80 mg Per Tube Daily   Chlorhexidine Gluconate Cloth  6 each Topical Daily   docusate  100 mg Per Tube BID   feeding supplement (PROSource TF20)  60 mL Per Tube Daily   heparin injection (subcutaneous)  5,000 Units Subcutaneous Q8H   mupirocin ointment  1 Application Nasal BID   mouth rinse  15 mL Mouth Rinse Q2H   pantoprazole (PROTONIX) IV  40 mg Intravenous Q24H   polyethylene glycol  17 g Per Tube Daily   potassium & sodium phosphates  2 packet Per Tube Once   Continuous Infusions:  amiodarone 30 mg/hr ( 0700)   dexmedetomidine (PRECEDEX) IV infusion Stopped (02/11/23 1413)   feeding supplement (VITAL AF 1.2 CAL) 40 mL/hr at  0700   insulin 7 Units/hr ( 0700)   norepinephrine (LEVOPHED) Adult infusion Stopped (02/11/23 2143)   piperacillin-tazobactam Stopped ( 0400)   potassium PHOSPHATE IVPB (in mmol)     propofol (DIPRIVAN) infusion 10 mcg/kg/min ( 0700)   PRN Meds: dextrose, fentaNYL (SUBLIMAZE) injection, fentaNYL (SUBLIMAZE) injection, mouth rinse   Vital Signs    Vitals:    0400  0500  0600  0700  BP:  (!) 131/59 (!) 146/71 (!) 153/74  Pulse: (!) 107 98 (!) 103 97  Resp:  (!) 25 (!) 28 (!) 40  Temp: (!) 97.3 F (36.3 C) 98.2 F (36.8 C) 98.4 F (36.9 C) 98.1 F (36.7 C)  TempSrc:      SpO2: 98% 95% 100% 96%  Weight: 83.2 kg     Height:        Intake/Output Summary (Last 24 hours) at  0742 Last data filed at  0700 Gross per 24 hour  Intake 2261.58 ml  Output 1650 ml  Net 611.58 ml   Filed Weights   02/10/23 0500 02/11/23 0330  0400  Weight: 81.3 kg 80.7 kg 83.2 kg    Telemetry     SR-> Sinus tachycardia - Personally Reviewed  Physical Exam   Gen: Intubated Cardiac: No Rubs or Gallops, systolic murmur, regular tachycardia Respiratory: Mechanical breath sounds GI: Soft, nontender, non-distended  MS: No edema; extremities cool to touch  Labs    Chemistry Recent Labs  Lab 01/31/2023 1015 02/07/2023 1040 02/10/23 0436 02/10/23 1817 02/10/23 1943 02/11/23 0402 02/11/23 0410  0500  NA 136   < > 134* 131*   < > 131* 134* 131*  K 3.2*   < > 3.4* 4.0   < > 3.3* 3.5 3.1*  CL 100   < > 103 101  --  101  --  101  CO2 16*   < > 21* 20*  --  23  --  21*  GLUCOSE 517*   < > 278* 140*  --  145*  --  205*  BUN 19   < > 33* 30*  --  29*  --  22  CREATININE 1.28*   < > 1.21* 1.05*  --  1.05*  --  0.95  CALCIUM 8.7*   < > 8.2* 8.1*  --  7.6*  --  7.8*  PROT 6.4*  --  6.3*  --   --   --   --   --  ALBUMIN 3.3*  --  2.9*  --   --   --   --   --   AST 215*  --  87*  --   --   --   --   --   ALT 161*  --  115*  --   --   --   --   --   ALKPHOS 102  --  80  --   --   --   --   --   BILITOT 0.9  --  0.7  --   --   --   --   --   GFRNONAA 46*   < > 49* 58*  --  58*  --  >60  ANIONGAP 20*   < > 10 10  --  7  --  9   < > = values in this interval not displayed.     Hematology Recent Labs  Lab 01/21/2023 1015 02/06/2023 1040 02/11/23 0402 02/11/23 0410  0500  WBC 25.5*  --  18.8*  --  12.3*  RBC 4.61  --  4.28  --  3.97  HGB 14.3   < > 12.9 12.9 12.0  HCT 43.5   < > 38.6 38.0 35.9*  MCV 94.4  --  90.2  --  90.4  MCH 31.0  --  30.1  --  30.2  MCHC 32.9  --  33.4  --  33.4  RDW 13.1  --  13.4  --  13.4  PLT 328  --  236  --  175   < > = values in this interval not displayed.    Cardiac EnzymesNo results for input(s): "TROPONINI" in the last 168 hours. No results for input(s): "TROPIPOC" in the last 168 hours.   BNPNo results for input(s): "BNP", "PROBNP" in the last 168 hours.   DDimer No results for input(s): "DDIMER" in the last 168 hours.    Radiology    No results found.  Patient Profile     67 y.o. female post arrest   Assessment & Plan    Cardiac Arrest Asystole -- we are following for potential neurologic recovery (where we would offer ischemic eval) but no longer predict this is the case   Acute hypoxic respiratory failure 2/2 cardiac arrest Hx of COPD -- intubated, management per PCCM   Acute HFrEF -- Echocardiogram with LVEF of 20-25%, with multiple wall motion abnormalities - I am not sure she will recover to the point; she is getting an MRI today for evaluation underlying mental status   WCT Concerning for AFL  with wide conduction of her RBBB vs atrial tachycardia Quiescent on IV amiodarone- we are switching to PO amiodarone - heparin as tolerated   Hypokalemia Hypocalcemia -- supp   Leukocytosis On Abx for 1/3 cultures + -- per primary  CRITICAL CARE Performed by: Darin Arndt A Stephine Langbehn  Total critical care time: 30 minutes. Critical care time was exclusive of separately billable procedures and treating other patients. Critical care was necessary to treat or prevent imminent or life-threatening deterioration. Critical care was time spent personally by me on the following activities: development of treatment plan with patient and/or surrogate as well as nursing, discussions with consultants, evaluation of patient's response to treatment, examination of patient, obtaining history from patient or surrogate, ordering and performing treatments and interventions, ordering and review of laboratory studies, ordering and review of radiographic studies, pulse oximetry and re-evaluation of patient's condition.    Signed, Keauna Brasel  Izora Ribas, MD FASE [facility]  [facility]   8:18 AM    For questions or updates, please contact [facility] Please consult [email or web address] for contact info under Cardiology/STEMI.      Riley Lam, MD FASE [facility] Cardiologist [facility]  [address] [place], [address] [place], [place] [address] [phone]  7:42 AM

## 2023-02-13 LAB — CULTURE, BLOOD (ROUTINE X 2)

## 2023-02-14 LAB — CULTURE, BLOOD (ROUTINE X 2): Culture: NO GROWTH

## 2023-02-17 NOTE — Progress Notes (Signed)
Patient removed from pixis upon discharge. Morphine gtt waste amount: 75cc. Midazolam waste amount: 75cc. Drugs quantified and discarded with Arnetha Gula, RN

## 2023-02-17 NOTE — Progress Notes (Addendum)
Patient asystole at 2238. Auscultated heart and lung sounds for 2 minutes, no sounds noted. Second RN verified: Arnetha Gula, RN. Patient pronounced at 2240 on 03/03/2023.

## 2023-02-17 NOTE — Death Summary Note (Signed)
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 [address]    Report Status 02/11/2023 FINAL  Final  MRSA Next Gen by PCR, Nasal     Status: Abnormal   Collection Time: 02/11/23 12:22 PM   Specimen: Nasal Mucosa; Nasal Swab  Result Value Ref Range Status   MRSA by PCR Next Gen DETECTED (A) NOT DETECTED Final    Comment: RESULT CALLED TO, READ BACK BY AND VERIFIED WITH: 02/11/23 AT 1548 TO RN SANDY ANDREWS, ADC (NOTE) The GeneXpert MRSA Assay (FDA approved for NASAL specimens only), is one component of a comprehensive MRSA colonization surveillance program. It is not intended to diagnose MRSA infection nor to guide or monitor treatment for MRSA infections. Test performance is not FDA approved in patients less than 55 years old. Performed at [facility], [address] [place], [place] [address]     Lab Basic Metabolic Panel: Recent Labs  Lab 02/06/2023 1015 01/24/2023 1040 02/10/23 0300 02/10/23 0436 02/10/23 1817 02/10/23 1943 02/11/23 0402 02/11/23 0410  0500  NA 136   < > 135 134* 131* 136 131* 134* 131*  K 3.2*   < > 3.3* 3.4* 4.0 3.9 3.3* 3.5  3.1*   CL 100   < > 103 103 101  --  101  --  101  CO2 16*   < > 19* 21* 20*  --  23  --  21*  GLUCOSE 517*   < > 337* 278* 140*  --  145*  --  205*  BUN 19   < > 35* 33* 30*  --  29*  --  22  CREATININE 1.28*   < > 1.19* 1.21* 1.05*  --  1.05*  --  0.95  CALCIUM 8.7*   < > 8.2* 8.2* 8.1*  --  7.6*  --  7.8*  MG 1.9  --   --   --  1.5*  --  2.7*  --  2.0  PHOS 5.3*  --   --   --   --   --   --   --  1.9*   < > = values in this interval not displayed.   Liver Function Tests: Recent Labs  Lab 02/16/2023 1015 02/10/23 0436  AST 215* 87*  ALT 161* 115*  ALKPHOS 102 80  BILITOT 0.9 0.7  PROT 6.4* 6.3*  ALBUMIN 3.3* 2.9*   Recent Labs  Lab 01/27/2023 1542  LIPASE 28   No results for input(s): "AMMONIA" in the last 168 hours. CBC: Recent Labs  Lab 02/02/2023 1015 01/17/2023 1040 02/05/2023 1334 02/10/23 1943 02/11/23 0402 02/11/23 0410  0500  WBC 25.5*  --   --   --  18.8*  --  12.3*  HGB 14.3   < > 16.3* 13.9 12.9 12.9 12.0  HCT 43.5   < > 48.0* 41.0 38.6 38.0 35.9*  MCV 94.4  --   --   --  90.2  --  90.4  PLT 328  --   --   --  236  --  175   < > = values in this interval not displayed.   Cardiac Enzymes: No results for input(s): "CKTOTAL", "CKMB", "CKMBINDEX", "TROPONINI" in the last 168 hours. Sepsis Labs: Recent Labs  Lab  1015 01/30/2023 1542 02/04/2023 1907 02/11/23 0402  0500  PROCALCITON <0.10  --   --   --   --   WBC 25.5*  --   --  18.8* 12.3*  LATICACIDVEN 8.6* 2.1* 2.5*  --   --     Procedures/Operations  Endotracheal intubation Central line placement Arterial line placement   Steffanie Dunn 02/02/2023, 9:35 PM

## 2023-02-17 DEATH — deceased
